# Patient Record
Sex: Female | Born: 1940 | Race: White | Hispanic: No | Marital: Married | Smoking: Former smoker
Health system: Southern US, Community
[De-identification: ages and names within clinical notes are randomized; demographics above are authoritative.]

## PROBLEM LIST (undated history)

## (undated) DIAGNOSIS — M722 Plantar fascial fibromatosis: Secondary | ICD-10-CM

## (undated) DIAGNOSIS — K589 Irritable bowel syndrome without diarrhea: Secondary | ICD-10-CM

## (undated) DIAGNOSIS — I10 Essential (primary) hypertension: Secondary | ICD-10-CM

## (undated) DIAGNOSIS — Z803 Family history of malignant neoplasm of breast: Secondary | ICD-10-CM

## (undated) DIAGNOSIS — Z8041 Family history of malignant neoplasm of ovary: Secondary | ICD-10-CM

## (undated) DIAGNOSIS — K219 Gastro-esophageal reflux disease without esophagitis: Secondary | ICD-10-CM

## (undated) DIAGNOSIS — C50919 Malignant neoplasm of unspecified site of unspecified female breast: Secondary | ICD-10-CM

## (undated) DIAGNOSIS — Z9289 Personal history of other medical treatment: Secondary | ICD-10-CM

## (undated) HISTORY — DX: Malignant neoplasm of unspecified site of unspecified female breast: C50.919

## (undated) HISTORY — DX: Family history of malignant neoplasm of ovary: Z80.41

## (undated) HISTORY — PX: BREAST LUMPECTOMY: SHX2

## (undated) HISTORY — DX: Family history of malignant neoplasm of breast: Z80.3

## (undated) HISTORY — PX: TONSILLECTOMY AND ADENOIDECTOMY: SUR1326

## (undated) HISTORY — DX: Plantar fascial fibromatosis: M72.2

## (undated) HISTORY — DX: Personal history of other medical treatment: Z92.89

## (undated) HISTORY — DX: Gastro-esophageal reflux disease without esophagitis: K21.9

## (undated) HISTORY — DX: Irritable bowel syndrome, unspecified: K58.9

## (undated) HISTORY — DX: Essential (primary) hypertension: I10

## (undated) HISTORY — DX: Gilbert syndrome: E80.4

---

## 1974-09-18 DIAGNOSIS — Z9289 Personal history of other medical treatment: Secondary | ICD-10-CM

## 1974-09-18 HISTORY — DX: Personal history of other medical treatment: Z92.89

## 1974-09-18 HISTORY — PX: APPENDECTOMY: SHX54

## 1974-09-18 HISTORY — PX: ABDOMINAL HYSTERECTOMY: SHX81

## 2000-07-06 ENCOUNTER — Other Ambulatory Visit: Admission: RE | Admit: 2000-07-06 | Discharge: 2000-07-06 | Payer: Self-pay | Admitting: Obstetrics and Gynecology

## 2001-07-15 ENCOUNTER — Other Ambulatory Visit: Admission: RE | Admit: 2001-07-15 | Discharge: 2001-07-15 | Payer: Self-pay | Admitting: Obstetrics and Gynecology

## 2004-09-18 HISTORY — PX: COLONOSCOPY W/ POLYPECTOMY: SHX1380

## 2005-02-20 ENCOUNTER — Ambulatory Visit: Payer: Self-pay | Admitting: Internal Medicine

## 2005-02-22 ENCOUNTER — Encounter: Admission: RE | Admit: 2005-02-22 | Discharge: 2005-02-22 | Payer: Self-pay | Admitting: Internal Medicine

## 2005-03-01 ENCOUNTER — Encounter: Admission: RE | Admit: 2005-03-01 | Discharge: 2005-05-30 | Payer: Self-pay | Admitting: Internal Medicine

## 2005-03-20 ENCOUNTER — Ambulatory Visit: Payer: Self-pay | Admitting: Gastroenterology

## 2005-03-23 ENCOUNTER — Ambulatory Visit: Payer: Self-pay | Admitting: Internal Medicine

## 2005-04-06 ENCOUNTER — Encounter (INDEPENDENT_AMBULATORY_CARE_PROVIDER_SITE_OTHER): Payer: Self-pay | Admitting: Specialist

## 2005-04-06 ENCOUNTER — Encounter: Payer: Self-pay | Admitting: Internal Medicine

## 2005-04-06 ENCOUNTER — Ambulatory Visit: Payer: Self-pay | Admitting: Gastroenterology

## 2005-05-24 ENCOUNTER — Ambulatory Visit: Payer: Self-pay | Admitting: Internal Medicine

## 2005-10-09 ENCOUNTER — Ambulatory Visit: Payer: Self-pay | Admitting: Internal Medicine

## 2006-01-08 ENCOUNTER — Ambulatory Visit: Payer: Self-pay | Admitting: Internal Medicine

## 2006-01-29 ENCOUNTER — Ambulatory Visit: Payer: Self-pay | Admitting: Internal Medicine

## 2006-07-03 ENCOUNTER — Ambulatory Visit: Payer: Self-pay | Admitting: Internal Medicine

## 2006-07-03 LAB — CONVERTED CEMR LAB
ALT: 39 units/L (ref 0–40)
AST: 30 units/L (ref 0–37)
Cholesterol: 175 mg/dL (ref 0–200)
Creatinine,U: 68.3 mg/dL
Hgb A1c MFr Bld: 5.9 % (ref 4.6–6.0)
Microalb, Ur: 0.2 mg/dL (ref 0.0–1.9)

## 2006-07-10 ENCOUNTER — Ambulatory Visit: Payer: Self-pay | Admitting: Internal Medicine

## 2006-10-02 ENCOUNTER — Ambulatory Visit: Payer: Self-pay | Admitting: Internal Medicine

## 2006-10-02 LAB — CONVERTED CEMR LAB
ALT: 32 units/L (ref 0–40)
Creatinine,U: 213.7 mg/dL
Hgb A1c MFr Bld: 6.2 % — ABNORMAL HIGH (ref 4.6–6.0)
Microalb Creat Ratio: 1.4 mg/g (ref 0.0–30.0)
Microalb, Ur: 0.3 mg/dL (ref 0.0–1.9)
VLDL: 34 mg/dL (ref 0–40)

## 2006-10-11 ENCOUNTER — Ambulatory Visit: Payer: Self-pay | Admitting: Internal Medicine

## 2007-02-06 ENCOUNTER — Ambulatory Visit: Payer: Self-pay | Admitting: Internal Medicine

## 2007-02-06 HISTORY — DX: Other disorders of bilirubin metabolism: E80.6

## 2007-02-06 LAB — CONVERTED CEMR LAB
BUN: 16 mg/dL (ref 6–23)
Creatinine, Ser: 1 mg/dL (ref 0.4–1.2)
HDL: 27.5 mg/dL — ABNORMAL LOW (ref >39.0)
Hgb A1c MFr Bld: 6 % (ref 4.6–6.0)
LDL Cholesterol: 61 mg/dL (ref 0–99)
Total CHOL/HDL Ratio: 4.5
Triglycerides: 172 mg/dL — ABNORMAL HIGH (ref 0–149)
VLDL: 34 mg/dL (ref 0–40)

## 2007-02-13 ENCOUNTER — Ambulatory Visit: Payer: Self-pay | Admitting: Internal Medicine

## 2007-02-13 ENCOUNTER — Encounter: Payer: Self-pay | Admitting: Internal Medicine

## 2007-04-17 ENCOUNTER — Encounter: Payer: Self-pay | Admitting: Internal Medicine

## 2007-07-24 ENCOUNTER — Ambulatory Visit: Payer: Self-pay | Admitting: Internal Medicine

## 2007-07-31 ENCOUNTER — Encounter (INDEPENDENT_AMBULATORY_CARE_PROVIDER_SITE_OTHER): Payer: Self-pay | Admitting: *Deleted

## 2007-07-31 LAB — CONVERTED CEMR LAB
Cholesterol: 142 mg/dL (ref 0–200)
Hgb A1c MFr Bld: 6 % (ref 4.6–6.0)
Triglycerides: 182 mg/dL — ABNORMAL HIGH (ref 0–149)

## 2007-09-30 ENCOUNTER — Ambulatory Visit: Payer: Self-pay | Admitting: Internal Medicine

## 2007-09-30 DIAGNOSIS — E781 Pure hyperglyceridemia: Secondary | ICD-10-CM | POA: Insufficient documentation

## 2007-09-30 DIAGNOSIS — R61 Generalized hyperhidrosis: Secondary | ICD-10-CM | POA: Insufficient documentation

## 2007-09-30 HISTORY — DX: Pure hyperglyceridemia: E78.1

## 2007-09-30 LAB — CONVERTED CEMR LAB
Cholesterol, target level: 200 mg/dL
HDL goal, serum: 40 mg/dL
LDL Goal: 130 mg/dL

## 2008-03-26 ENCOUNTER — Ambulatory Visit: Payer: Self-pay | Admitting: Internal Medicine

## 2008-03-26 LAB — CONVERTED CEMR LAB
Creatinine,U: 55.6 mg/dL
Hgb A1c MFr Bld: 6.3 % — ABNORMAL HIGH (ref 4.6–6.0)
Total CHOL/HDL Ratio: 5.5
Triglycerides: 199 mg/dL — ABNORMAL HIGH (ref 0–149)

## 2008-04-03 ENCOUNTER — Ambulatory Visit: Payer: Self-pay | Admitting: Internal Medicine

## 2008-05-06 ENCOUNTER — Ambulatory Visit: Payer: Self-pay | Admitting: Internal Medicine

## 2008-05-06 DIAGNOSIS — K219 Gastro-esophageal reflux disease without esophagitis: Secondary | ICD-10-CM

## 2008-05-06 DIAGNOSIS — T887XXA Unspecified adverse effect of drug or medicament, initial encounter: Secondary | ICD-10-CM

## 2008-05-06 HISTORY — DX: Gastro-esophageal reflux disease without esophagitis: K21.9

## 2008-07-29 ENCOUNTER — Ambulatory Visit: Payer: Self-pay | Admitting: Internal Medicine

## 2008-08-05 ENCOUNTER — Ambulatory Visit: Payer: Self-pay | Admitting: Internal Medicine

## 2008-08-05 LAB — CONVERTED CEMR LAB
Creatinine, Ser: 1.1 mg/dL (ref 0.4–1.2)
HDL: 27.3 mg/dL — ABNORMAL LOW (ref >39.0)
Hgb A1c MFr Bld: 6.2 % — ABNORMAL HIGH (ref 4.6–6.0)
Potassium: 3.7 meq/L (ref 3.5–5.1)

## 2008-08-11 ENCOUNTER — Ambulatory Visit: Payer: Self-pay | Admitting: Internal Medicine

## 2008-08-11 DIAGNOSIS — E1122 Type 2 diabetes mellitus with diabetic chronic kidney disease: Secondary | ICD-10-CM

## 2008-08-11 DIAGNOSIS — R0609 Other forms of dyspnea: Secondary | ICD-10-CM

## 2008-08-11 DIAGNOSIS — N183 Chronic kidney disease, stage 3 unspecified: Secondary | ICD-10-CM

## 2008-08-11 DIAGNOSIS — R0989 Other specified symptoms and signs involving the circulatory and respiratory systems: Secondary | ICD-10-CM

## 2008-08-11 DIAGNOSIS — E119 Type 2 diabetes mellitus without complications: Secondary | ICD-10-CM

## 2008-08-11 HISTORY — DX: Type 2 diabetes mellitus with diabetic chronic kidney disease: E11.22

## 2008-08-11 HISTORY — DX: Chronic kidney disease, stage 3 unspecified: N18.30

## 2008-08-14 ENCOUNTER — Encounter (INDEPENDENT_AMBULATORY_CARE_PROVIDER_SITE_OTHER): Payer: Self-pay | Admitting: *Deleted

## 2008-08-17 ENCOUNTER — Ambulatory Visit: Payer: Self-pay | Admitting: Internal Medicine

## 2008-08-17 LAB — CONVERTED CEMR LAB: TSH: 2 u[IU]/mL (ref 0.35–5.50)

## 2008-09-01 ENCOUNTER — Ambulatory Visit: Payer: Self-pay

## 2008-09-01 ENCOUNTER — Encounter: Payer: Self-pay | Admitting: Internal Medicine

## 2008-09-15 ENCOUNTER — Ambulatory Visit: Payer: Self-pay | Admitting: Internal Medicine

## 2008-09-16 ENCOUNTER — Ambulatory Visit: Payer: Self-pay | Admitting: Internal Medicine

## 2008-10-12 ENCOUNTER — Ambulatory Visit: Payer: Self-pay | Admitting: Internal Medicine

## 2008-10-12 ENCOUNTER — Ambulatory Visit (HOSPITAL_COMMUNITY): Admission: RE | Admit: 2008-10-12 | Discharge: 2008-10-12 | Payer: Self-pay | Admitting: Internal Medicine

## 2009-01-22 ENCOUNTER — Ambulatory Visit: Payer: Self-pay | Admitting: Internal Medicine

## 2009-01-25 ENCOUNTER — Telehealth (INDEPENDENT_AMBULATORY_CARE_PROVIDER_SITE_OTHER): Payer: Self-pay | Admitting: *Deleted

## 2009-02-08 ENCOUNTER — Ambulatory Visit: Payer: Self-pay | Admitting: Family Medicine

## 2009-02-10 ENCOUNTER — Encounter: Admission: RE | Admit: 2009-02-10 | Discharge: 2009-05-11 | Payer: Self-pay | Admitting: Sports Medicine

## 2009-02-13 ENCOUNTER — Encounter (INDEPENDENT_AMBULATORY_CARE_PROVIDER_SITE_OTHER): Payer: Self-pay | Admitting: *Deleted

## 2009-02-13 LAB — CONVERTED CEMR LAB
Alkaline Phosphatase: 64 units/L (ref 39–117)
BUN: 16 mg/dL (ref 6–23)
Bilirubin, Direct: 0 mg/dL (ref 0.0–0.3)
Creatinine, Ser: 1.1 mg/dL (ref 0.4–1.2)
HDL: 26 mg/dL — ABNORMAL LOW (ref >39.00)
Hgb A1c MFr Bld: 6.4 % (ref 4.6–6.5)
Microalb Creat Ratio: 1.4 mg/g (ref 0.0–30.0)
Total Bilirubin: 0.8 mg/dL (ref 0.3–1.2)
Total CHOL/HDL Ratio: 5
Triglycerides: 241 mg/dL — ABNORMAL HIGH (ref 0.0–149.0)

## 2009-02-16 ENCOUNTER — Ambulatory Visit: Payer: Self-pay | Admitting: Internal Medicine

## 2009-02-16 DIAGNOSIS — I10 Essential (primary) hypertension: Secondary | ICD-10-CM

## 2009-02-16 HISTORY — DX: Essential (primary) hypertension: I10

## 2009-06-07 ENCOUNTER — Ambulatory Visit: Payer: Self-pay | Admitting: Internal Medicine

## 2009-06-07 LAB — CONVERTED CEMR LAB
AST: 37 units/L (ref 0–37)
Albumin: 3.6 g/dL (ref 3.5–5.2)
Alkaline Phosphatase: 55 units/L (ref 39–117)
Hgb A1c MFr Bld: 6.4 % (ref 4.6–6.5)
VLDL: 39 mg/dL (ref 0.0–40.0)

## 2009-06-10 ENCOUNTER — Ambulatory Visit: Payer: Self-pay | Admitting: Internal Medicine

## 2009-08-05 ENCOUNTER — Ambulatory Visit: Payer: Self-pay | Admitting: Internal Medicine

## 2009-08-06 ENCOUNTER — Ambulatory Visit: Payer: Self-pay | Admitting: Internal Medicine

## 2010-01-10 ENCOUNTER — Telehealth (INDEPENDENT_AMBULATORY_CARE_PROVIDER_SITE_OTHER): Payer: Self-pay | Admitting: *Deleted

## 2010-01-31 ENCOUNTER — Ambulatory Visit: Payer: Self-pay | Admitting: Internal Medicine

## 2010-01-31 DIAGNOSIS — IMO0001 Reserved for inherently not codable concepts without codable children: Secondary | ICD-10-CM | POA: Insufficient documentation

## 2010-01-31 DIAGNOSIS — R5381 Other malaise: Secondary | ICD-10-CM | POA: Insufficient documentation

## 2010-01-31 DIAGNOSIS — R5383 Other fatigue: Secondary | ICD-10-CM

## 2010-02-01 LAB — CONVERTED CEMR LAB
AST: 43 units/L — ABNORMAL HIGH (ref 0–37)
Basophils Absolute: 0 10*3/uL (ref 0.0–0.1)
CO2: 28 meq/L (ref 19–32)
Calcium: 9.7 mg/dL (ref 8.4–10.5)
Chloride: 104 meq/L (ref 96–112)
Eosinophils Absolute: 0.1 10*3/uL (ref 0.0–0.7)
Glucose, Bld: 134 mg/dL — ABNORMAL HIGH (ref 70–99)
HCT: 40.7 % (ref 36.0–46.0)
Hemoglobin: 14 g/dL (ref 12.0–15.0)
Lymphs Abs: 1.9 10*3/uL (ref 0.7–4.0)
MCHC: 34.5 g/dL (ref 30.0–36.0)
MCV: 89.5 fL (ref 78.0–100.0)
Monocytes Absolute: 0.6 10*3/uL (ref 0.1–1.0)
Neutro Abs: 2.9 10*3/uL (ref 1.4–7.7)
Potassium: 4.4 meq/L (ref 3.5–5.1)
RDW: 14.3 % (ref 11.5–14.6)
Sodium: 142 meq/L (ref 135–145)
Total Bilirubin: 0.9 mg/dL (ref 0.3–1.2)

## 2010-02-24 ENCOUNTER — Ambulatory Visit: Payer: Self-pay | Admitting: Internal Medicine

## 2010-03-04 ENCOUNTER — Encounter (INDEPENDENT_AMBULATORY_CARE_PROVIDER_SITE_OTHER): Payer: Self-pay | Admitting: *Deleted

## 2010-03-04 LAB — CONVERTED CEMR LAB
AST: 45 units/L — ABNORMAL HIGH (ref 0–37)
Hgb A1c MFr Bld: 6.6 % — ABNORMAL HIGH (ref 4.6–6.5)

## 2010-05-25 ENCOUNTER — Ambulatory Visit: Payer: Self-pay | Admitting: Internal Medicine

## 2010-05-25 DIAGNOSIS — K3189 Other diseases of stomach and duodenum: Secondary | ICD-10-CM

## 2010-05-25 DIAGNOSIS — R1319 Other dysphagia: Secondary | ICD-10-CM

## 2010-05-25 DIAGNOSIS — R1013 Epigastric pain: Secondary | ICD-10-CM

## 2010-05-25 DIAGNOSIS — R079 Chest pain, unspecified: Secondary | ICD-10-CM | POA: Insufficient documentation

## 2010-05-30 ENCOUNTER — Ambulatory Visit: Payer: Self-pay | Admitting: Internal Medicine

## 2010-05-30 LAB — CONVERTED CEMR LAB
OCCULT 1: NEGATIVE
OCCULT 2: NEGATIVE

## 2010-05-31 ENCOUNTER — Encounter (INDEPENDENT_AMBULATORY_CARE_PROVIDER_SITE_OTHER): Payer: Self-pay | Admitting: *Deleted

## 2010-06-02 ENCOUNTER — Telehealth: Payer: Self-pay | Admitting: Internal Medicine

## 2010-06-06 ENCOUNTER — Encounter (INDEPENDENT_AMBULATORY_CARE_PROVIDER_SITE_OTHER): Payer: Self-pay | Admitting: *Deleted

## 2010-06-06 ENCOUNTER — Ambulatory Visit: Payer: Self-pay | Admitting: Internal Medicine

## 2010-06-06 DIAGNOSIS — Z860100 Personal history of colon polyps, unspecified: Secondary | ICD-10-CM

## 2010-06-06 DIAGNOSIS — R197 Diarrhea, unspecified: Secondary | ICD-10-CM

## 2010-06-06 HISTORY — DX: Personal history of colon polyps, unspecified: Z86.0100

## 2010-06-06 HISTORY — DX: Personal history of colonic polyps: Z86.010

## 2010-06-23 ENCOUNTER — Ambulatory Visit: Payer: Self-pay | Admitting: Internal Medicine

## 2010-06-27 ENCOUNTER — Encounter: Payer: Self-pay | Admitting: Internal Medicine

## 2010-07-01 ENCOUNTER — Encounter: Payer: Self-pay | Admitting: Internal Medicine

## 2010-07-12 ENCOUNTER — Encounter: Payer: Self-pay | Admitting: Internal Medicine

## 2010-07-15 ENCOUNTER — Encounter: Payer: Self-pay | Admitting: Internal Medicine

## 2010-07-18 ENCOUNTER — Encounter: Payer: Self-pay | Admitting: Internal Medicine

## 2010-07-19 ENCOUNTER — Encounter: Payer: Self-pay | Admitting: Internal Medicine

## 2010-07-19 ENCOUNTER — Telehealth (INDEPENDENT_AMBULATORY_CARE_PROVIDER_SITE_OTHER): Payer: Self-pay | Admitting: *Deleted

## 2010-07-25 ENCOUNTER — Encounter: Admission: RE | Admit: 2010-07-25 | Discharge: 2010-07-25 | Payer: Self-pay | Admitting: Radiology

## 2010-07-27 ENCOUNTER — Encounter: Payer: Self-pay | Admitting: Internal Medicine

## 2010-08-02 ENCOUNTER — Encounter: Payer: Self-pay | Admitting: Internal Medicine

## 2010-08-19 ENCOUNTER — Ambulatory Visit: Payer: Self-pay | Admitting: Internal Medicine

## 2010-08-22 ENCOUNTER — Ambulatory Visit
Admission: RE | Admit: 2010-08-22 | Discharge: 2010-09-15 | Payer: Self-pay | Source: Home / Self Care | Attending: Radiation Oncology | Admitting: Radiation Oncology

## 2010-08-23 ENCOUNTER — Encounter: Payer: Self-pay | Admitting: Internal Medicine

## 2010-09-02 ENCOUNTER — Ambulatory Visit: Payer: Self-pay | Admitting: Internal Medicine

## 2010-09-02 ENCOUNTER — Encounter: Payer: Self-pay | Admitting: Internal Medicine

## 2010-09-02 DIAGNOSIS — M2550 Pain in unspecified joint: Secondary | ICD-10-CM | POA: Insufficient documentation

## 2010-09-02 HISTORY — DX: Pain in unspecified joint: M25.50

## 2010-09-04 DIAGNOSIS — C50111 Malignant neoplasm of central portion of right female breast: Secondary | ICD-10-CM

## 2010-09-04 HISTORY — DX: Malignant neoplasm of central portion of right female breast: C50.111

## 2010-09-05 ENCOUNTER — Ambulatory Visit: Payer: Self-pay | Admitting: Internal Medicine

## 2010-09-05 ENCOUNTER — Telehealth (INDEPENDENT_AMBULATORY_CARE_PROVIDER_SITE_OTHER): Payer: Self-pay | Admitting: *Deleted

## 2010-09-05 ENCOUNTER — Encounter: Payer: Self-pay | Admitting: Internal Medicine

## 2010-09-05 LAB — CONVERTED CEMR LAB
Basophils Relative: 0.5 % (ref 0.0–3.0)
Eosinophils Relative: 3.7 % (ref 0.0–5.0)
HCT: 38.2 % (ref 36.0–46.0)
Hemoglobin: 13.3 g/dL (ref 12.0–15.0)
Lymphs Abs: 1.8 10*3/uL (ref 0.7–4.0)
MCV: 89.4 fL (ref 78.0–100.0)
Monocytes Absolute: 0.6 10*3/uL (ref 0.1–1.0)
Monocytes Relative: 10 % (ref 3.0–12.0)
Neutro Abs: 3.4 10*3/uL (ref 1.4–7.7)
Platelets: 254 10*3/uL (ref 150.0–400.0)
RBC: 4.27 M/uL (ref 3.87–5.11)
Rhuematoid fact SerPl-aCnc: <20 intl units/mL (ref 0–20)
WBC: 6.1 10*3/uL (ref 4.5–10.5)

## 2010-09-06 ENCOUNTER — Encounter: Payer: Self-pay | Admitting: Internal Medicine

## 2010-09-07 ENCOUNTER — Telehealth (INDEPENDENT_AMBULATORY_CARE_PROVIDER_SITE_OTHER): Payer: Self-pay | Admitting: *Deleted

## 2010-09-07 LAB — CONVERTED CEMR LAB
ALT: 68 units/L — ABNORMAL HIGH (ref 0–35)
AST: 75 units/L — ABNORMAL HIGH (ref 0–37)
Alkaline Phosphatase: 67 units/L (ref 39–117)
Bilirubin, Direct: 0.2 mg/dL (ref 0.0–0.3)
HCV Ab: NEGATIVE
Total Bilirubin: 1.4 mg/dL — ABNORMAL HIGH (ref 0.3–1.2)

## 2010-09-14 ENCOUNTER — Ambulatory Visit: Payer: Self-pay | Admitting: Internal Medicine

## 2010-09-14 DIAGNOSIS — R7989 Other specified abnormal findings of blood chemistry: Secondary | ICD-10-CM | POA: Insufficient documentation

## 2010-09-15 ENCOUNTER — Encounter: Payer: Self-pay | Admitting: Internal Medicine

## 2010-09-15 LAB — CONVERTED CEMR LAB
ALT: 59 units/L — ABNORMAL HIGH (ref 0–35)
AST: 53 units/L — ABNORMAL HIGH (ref 0–37)
Bilirubin, Direct: 0.1 mg/dL (ref 0.0–0.3)
Total Bilirubin: 0.9 mg/dL (ref 0.3–1.2)

## 2010-09-16 ENCOUNTER — Telehealth (INDEPENDENT_AMBULATORY_CARE_PROVIDER_SITE_OTHER): Payer: Self-pay | Admitting: *Deleted

## 2010-09-16 ENCOUNTER — Encounter
Admission: RE | Admit: 2010-09-16 | Discharge: 2010-09-16 | Payer: Self-pay | Source: Home / Self Care | Attending: Internal Medicine | Admitting: Internal Medicine

## 2010-09-16 ENCOUNTER — Encounter: Payer: Self-pay | Admitting: Internal Medicine

## 2010-09-18 DIAGNOSIS — C50919 Malignant neoplasm of unspecified site of unspecified female breast: Secondary | ICD-10-CM

## 2010-09-18 HISTORY — DX: Malignant neoplasm of unspecified site of unspecified female breast: C50.919

## 2010-09-18 HISTORY — PX: MASTECTOMY W/ NODES PARTIAL: SUR854

## 2010-09-23 ENCOUNTER — Ambulatory Visit
Admission: RE | Admit: 2010-09-23 | Discharge: 2010-09-23 | Payer: Self-pay | Source: Home / Self Care | Attending: Internal Medicine | Admitting: Internal Medicine

## 2010-09-23 ENCOUNTER — Other Ambulatory Visit: Payer: Self-pay | Admitting: Internal Medicine

## 2010-09-23 LAB — ALT: ALT: 65 U/L — ABNORMAL HIGH (ref 0–35)

## 2010-09-23 LAB — AST: AST: 58 U/L — ABNORMAL HIGH (ref 0–37)

## 2010-10-12 ENCOUNTER — Telehealth (INDEPENDENT_AMBULATORY_CARE_PROVIDER_SITE_OTHER): Payer: Self-pay | Admitting: *Deleted

## 2010-10-12 ENCOUNTER — Inpatient Hospital Stay (HOSPITAL_COMMUNITY)
Admission: RE | Admit: 2010-10-12 | Discharge: 2010-10-14 | Payer: Self-pay | Source: Home / Self Care | Attending: Surgery | Admitting: Surgery

## 2010-10-12 ENCOUNTER — Ambulatory Visit (HOSPITAL_COMMUNITY)
Admission: RE | Admit: 2010-10-12 | Discharge: 2010-10-12 | Payer: Self-pay | Source: Home / Self Care | Attending: Surgery | Admitting: Surgery

## 2010-10-12 LAB — CBC
HCT: 29 % — ABNORMAL LOW (ref 36.0–46.0)
Hemoglobin: 13.5 g/dL (ref 12.0–15.0)
Hemoglobin: 9.7 g/dL — ABNORMAL LOW (ref 12.0–15.0)
MCH: 29.3 pg (ref 26.0–34.0)
MCH: 29.1 pg (ref 26.0–34.0)
MCHC: 34.2 g/dL (ref 30.0–36.0)
MCHC: 33.4 g/dL (ref 30.0–36.0)
MCV: 85.7 fL (ref 78.0–100.0)
MCV: 87.1 fL (ref 78.0–100.0)
RDW: 13.3 % (ref 11.5–15.5)

## 2010-10-12 LAB — COMPREHENSIVE METABOLIC PANEL
ALT: 69 U/L — ABNORMAL HIGH (ref 0–35)
AST: 64 U/L — ABNORMAL HIGH (ref 0–37)
Alkaline Phosphatase: 69 U/L (ref 39–117)
CO2: 26 mEq/L (ref 19–32)
Calcium: 9.8 mg/dL (ref 8.4–10.5)
Chloride: 106 mEq/L (ref 96–112)
GFR calc non Af Amer: 50 mL/min — ABNORMAL LOW (ref >60)
Glucose, Bld: 153 mg/dL — ABNORMAL HIGH (ref 70–99)
Sodium: 141 mEq/L (ref 135–145)
Total Bilirubin: 1 mg/dL (ref 0.3–1.2)

## 2010-10-12 LAB — URINALYSIS, ROUTINE W REFLEX MICROSCOPIC
Bilirubin Urine: NEGATIVE
Hgb urine dipstick: NEGATIVE
Protein, ur: NEGATIVE mg/dL
Urobilinogen, UA: 0.2 mg/dL (ref 0.0–1.0)

## 2010-10-12 LAB — PROTIME-INR: Prothrombin Time: 12.7 seconds (ref 11.6–15.2)

## 2010-10-12 LAB — DIFFERENTIAL
Basophils Relative: 1 % (ref 0–1)
Eosinophils Absolute: 0.1 10*3/uL (ref 0.0–0.7)
Lymphs Abs: 1.6 10*3/uL (ref 0.7–4.0)
Monocytes Absolute: 0.7 10*3/uL (ref 0.1–1.0)
Monocytes Relative: 12 % (ref 3–12)
Neutro Abs: 3.7 10*3/uL (ref 1.7–7.7)

## 2010-10-12 LAB — BASIC METABOLIC PANEL
BUN: 11 mg/dL (ref 6–23)
CO2: 24 mEq/L (ref 19–32)
Calcium: 8 mg/dL — ABNORMAL LOW (ref 8.4–10.5)
GFR calc non Af Amer: 46 mL/min — ABNORMAL LOW (ref >60)
Glucose, Bld: 243 mg/dL — ABNORMAL HIGH (ref 70–99)

## 2010-10-12 LAB — SURGICAL PCR SCREEN: Staphylococcus aureus: NEGATIVE

## 2010-10-13 LAB — BASIC METABOLIC PANEL
CO2: 28 mEq/L (ref 19–32)
Calcium: 8.5 mg/dL (ref 8.4–10.5)
Creatinine, Ser: 1.11 mg/dL (ref 0.4–1.2)
GFR calc Af Amer: 59 mL/min — ABNORMAL LOW (ref >60)
GFR calc non Af Amer: 49 mL/min — ABNORMAL LOW (ref >60)
Glucose, Bld: 138 mg/dL — ABNORMAL HIGH (ref 70–99)
Sodium: 142 mEq/L (ref 135–145)

## 2010-10-13 LAB — CBC
Hemoglobin: 9.9 g/dL — ABNORMAL LOW (ref 12.0–15.0)
MCH: 29.7 pg (ref 26.0–34.0)
MCHC: 33.3 g/dL (ref 30.0–36.0)
RDW: 13.6 % (ref 11.5–15.5)

## 2010-10-13 NOTE — Op Note (Addendum)
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  PROCEDURES: 1. Right total mastectomy with blue dye injection and right axillary     sentinel lymph node biopsy (one node). 2. Left total mastectomy.  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  She also wished to have a prophylactic left mastectomy and she had seen Dr. Wayland Denis for consultation for possible immediate reconstructions.  After the consultation, the plan was for a total mastectomy bilaterally with sentinel lymph node evaluation on the right and bilateral tissue expander reconstruction.  DESCRIPTION OF PROCEDURE:  I saw the patient in the holding area and she had no further questions.  We confirmed bilateral mastectomies with right sentinel node as the planned procedure and I initialed the right side as the side for the sentinel node.  When I saw her, her radioisotope had already been injected in the right breast.  The patient was taken to the operating room and after satisfactory general anesthesia had been obtained, Foley catheter was placed and the breast was prepped and draped.  I did a time-out prior to prepping and after that was done injected 5 mL of dilute methylene blue subareolarly and massaged that area.  I started on the right side.  I made an elliptical incision  and then raised the usual skin flaps to sternum and clavicle and out towards the latissimus.  When I got to the edge of the pectoralis with skin flap, I used the NeoProbe and found a hot area and dissection in that area showed a blue lymphatic leading to a bright blue lymph node, which was removed and had counts of about 1500.  With that removed, there was no palpable adenopathy, no other high counts, and no other blue lymph nodes noted.  Attention was turned to the inferior flap, which was then made in the usual fashion going at the inframammary fold and then laterally to latissimus.  The breast was removed from medial to lateral.  I opened the clavipectoral fascia, but tried to stay out of the axilla proper, disconnected the breast from the chest wall.  About this time, Dr. Dierdre Searles had called with the pathology report on the lymph node, which was negative.  Once I disconnected the breast, we irrigated and made sure everything was completely dry and put a moist pack.  The left side was prophylactic, so I did a skin-sparing mastectomy taking only the skin of the nipple, raised skin flaps identical to the right side and removed the breast from medial  to lateral at this time trying to preserve some of the fascia since this was prophylactic.  Again once it was removed, we irrigated and made sure everything was dry, and I put moist pack.  At this point, Dr. Kelly Splinter entered to do the reconstructions.  The patient tolerated the procedure well.  There were no complications.  The estimated blood loss for that portion of the procedure was 200 mL.     Currie Paris, M.D.     CJS/MEDQ  D:  10/12/2010  T:  10/13/2010  Job:  [id number]  cc:   Wayland Denis, DO Titus Dubin. Alwyn Ren, MD,FACP,FCCP  Electronically Signed by Cyndia Bent M.D. on 10/13/2010 07:32:33 AM

## 2010-10-14 LAB — PROTEIN ELECTROPHORESIS, SERUM
Albumin ELP: 61.8 % (ref 55.8–66.1)
Beta 2: 5.4 % (ref 3.2–6.5)
Beta Globulin: 4.5 % — ABNORMAL LOW (ref 4.7–7.2)
M-Spike, %: NOT DETECTED g/dL
Total Protein ELP: 5.2 g/dL — ABNORMAL LOW (ref 6.0–8.3)

## 2010-10-16 LAB — CONVERTED CEMR LAB
ALT: 64 units/L — ABNORMAL HIGH (ref 0–35)
AST: 69 units/L — ABNORMAL HIGH (ref 0–37)
Albumin: 4 g/dL (ref 3.5–5.2)
Alkaline Phosphatase: 70 units/L (ref 39–117)
Amylase: 37 units/L (ref 27–131)
Eosinophils Relative: 1.8 % (ref 0.0–5.0)
Eosinophils Relative: 4.3 % (ref 0.0–5.0)
HCT: 37.6 % (ref 36.0–46.0)
Hemoglobin: 12.9 g/dL (ref 12.0–15.0)
Lymphocytes Relative: 42.7 % (ref 12.0–46.0)
Lymphs Abs: 2.5 10*3/uL (ref 0.7–4.0)
Monocytes Relative: 13.9 % — ABNORMAL HIGH (ref 3.0–12.0)
Monocytes Relative: 14.5 % — ABNORMAL HIGH (ref 3.0–12.0)
Neutro Abs: 2.2 10*3/uL (ref 1.4–7.7)
Neutrophils Relative %: 52.7 % (ref 43.0–77.0)
Platelets: 246 10*3/uL (ref 150–400)
Total Bilirubin: 0.9 mg/dL (ref 0.3–1.2)
WBC: 5.8 10*3/uL (ref 4.5–10.5)
WBC: 6.5 10*3/uL (ref 4.5–10.5)

## 2010-10-18 ENCOUNTER — Encounter: Payer: Self-pay | Admitting: Internal Medicine

## 2010-10-18 NOTE — Assessment & Plan Note (Signed)
Summary: indigestion/cbs   Vital Signs:  Patient profile:   70 year old female Weight:      190.2 pounds BMI:     32.77 Temp:     99.1 degrees F oral Pulse rate:   64 / minute Resp:     17 per minute BP sitting:   124 / 88  (left arm) Cuff size:   large  Vitals Entered By: Shonna Chock CMA (May 25, 2010 1:50 PM) CC: Indigestion since Aug 2011, worse x 1 week, Heartburn   Primary Care Provider:  [name] MD  CC:  Indigestion since Aug 2011, worse x 1 week, and Heartburn.  History of Present Illness: Heartburn      This is a 70 year old woman who presents with Heartburn X 1 month.  The patient reports acid reflux, sour taste in mouth, epigastric pain, and chest pain, but denies trouble swallowing and weight loss.  The patient reports the following alarm features of dyspepsia: dysphagia "a couple of times / day".  The patient denies the following alarm features: melena, hematemesis, vomiting, and involuntary weight loss >5%.  Symptoms are worse with citrus. Similar issue in 1996: esophageal stricture was found  @ EGD .  Treatment tried and  ineffective or stopped due to problems include elevating the head of bed, an antacid, and an H2 blocker (Pepcid AC).    Current Medications (verified): 1)  Diovan Hct 320-12.5 Mg  Tabs (Valsartan-Hydrochlorothiazide) .... 1/2 By Mouth Qam 1/2 Pm 2)  Metformin Hcl 500 Mg  Tabs (Metformin Hcl) .Marland Kitchen.. 1 By Mouth Two Times A Day With Largest Meals 3)  Pravastatin Sodium 20 Mg  Tabs (Pravastatin Sodium) .... 1/2 At Bedtime 4)  Multivitamin .... Qd 5)  Calcium With Vit D 600mg  .... Bid 6)  Fish Oil Concentrate .... 1000mg  Bid 7)  Clonidine Hcl 0.2 Mg  Tabs (Clonidine Hcl) .Marland Kitchen.. 1 Bid 8)  Oxytrol 3.9 Mg/24hr Pttw (Oxybutynin) .Marland Kitchen.. 1 On Mondays-Pm, 1 On Friday-Am 9)  Vitamin D3 1000 Unit Caps (Cholecalciferol) .Marland Kitchen.. 1 By Mouth Two Times A Day 10)  Sulfamethoxazole-Trimethoprim 400-80 Mg Tabs (Sulfamethoxazole-Trimethoprim) .... As A  Prevenative  Allergies: 1)  ! Ace Inhibitors 2)  ! Codeine  Past History:  Past Medical History: Hypertension Hyperlipidemia Gilbert's syndrome ACE-I cough GERD with stricture  Past Surgical History: Hysterectomy Oophorectomycolonoscopy with polyps due 2011 breast biopsy times 6 with lumpectomy Colon polypectomy 2006 ,due 2011, Dr Leone Payor EGD: stricture 1996  Review of Systems CV:  Denies leg cramps with exertion and shortness of breath with exertion; No exertional chest pain.  Physical Exam  General:  well-nourished,in no acute distress; alert,appropriate and cooperative throughout examination Eyes:  No corneal or conjunctival inflammation noted.No icterus Mouth:  Oral mucosa and oropharynx without lesions or exudates.  Teeth in good repair. No pharyngeal erythema.   Lungs:  Normal respiratory effort, chest expands symmetrically. Lungs are clear to auscultation, no crackles or wheezes. Heart:  Normal rate and regular rhythm. S1 and S2 normal without gallop, murmur, click, rub or other extra sounds. Abdomen:  Bowel sounds positive,abdomen soft  slightly tender  epigastrium without masses, organomegaly or hernias noted. Pulses:  R and L carotid,radial,dorsalis pedis and posterior tibial pulses are full and equal bilaterally Extremities:  No clubbing, cyanosis, edema. Neg Homan's Neurologic:  alert & oriented X3.   Skin:  Intact without suspicious lesions or rashes. No jaundice Cervical Nodes:  No lymphadenopathy noted Axillary Nodes:  No palpable lymphadenopathy Psych:  memory intact  for recent and remote, normally interactive, good eye contact, and not anxious appearing.     Impression & Recommendations:  Problem # 1:  DYSPEPSIA (ICD-536.8)  Severe  Orders: Venipuncture ([address]) Specimen Handling (60454) Gastroenterology Referral (GI)  Problem # 2:  ABDOMINAL PAIN, EPIGASTRIC (ICD-789.06)  Orders: Venipuncture (09811) TLB-CBC Platelet - w/Differential  (85025-CBCD) TLB-Hepatic/Liver Function Pnl (80076-HEPATIC) TLB-Amylase (82150-AMYL) TLB-H. Pylori Abs(Helicobacter Pylori) (86677-HELICO) TLB-Lipase (83690-LIPASE) Specimen Handling (91478) Gastroenterology Referral (GI)  Problem # 3:  CHEST PAIN (ICD-786.50)  Orders: EKG w/ Interpretation (93000)  Problem # 4:  OTHER DYSPHAGIA (GNF-621.30)  Orders: Venipuncture (86578) Gastroenterology Referral (GI)  Complete Medication List: 1)  Diovan Hct 320-12.5 Mg Tabs (Valsartan-hydrochlorothiazide) .... 1/2 by mouth qam 1/2 pm 2)  Metformin Hcl 500 Mg Tabs (Metformin hcl) .Marland Kitchen.. 1 by mouth two times a day with largest meals 3)  Pravastatin Sodium 20 Mg Tabs (Pravastatin sodium) .... 1/2 at bedtime 4)  Multivitamin  .... Qd 5)  Calcium With Vit D 600mg   .... Bid 6)  Fish Oil Concentrate  .... 1000mg  bid 7)  Clonidine Hcl 0.2 Mg Tabs (Clonidine hcl) .Marland Kitchen.. 1 bid 8)  Oxytrol 3.9 Mg/24hr Pttw (Oxybutynin) .Marland Kitchen.. 1 on mondays-pm, 1 on friday-am 9)  Vitamin D3 1000 Unit Caps (Cholecalciferol) .Marland Kitchen.. 1 by mouth two times a day 10)  Sulfamethoxazole-trimethoprim 400-80 Mg Tabs (Sulfamethoxazole-trimethoprim) .... As a prevenative 11)  Nexium 40 Mg Cpdr (Esomeprazole magnesium) .Marland Kitchen.. 1 two times a day pre meals  Patient Instructions: 1)  Complete stool cards.  2)  Avoid foods high in acid (tomatoes, citrus juices, spicy foods). Avoid eating within two hours of lying down or before exercising. Do not over eat; try smaller more frequent meals. Elevate head of bed twelve inches when sleeping. Prescriptions: NEXIUM 40 MG CPDR (ESOMEPRAZOLE MAGNESIUM) 1 two times a day pre meals  #20 x 0   Entered and Authorized by:   [name] MD   Signed by:   [name] MD on 05/26/2010   Method used:   Samples Given   RxID:   [id number]

## 2010-10-18 NOTE — Letter (Signed)
Summary:  [facility]   Imported By: Lanelle Bal 08/02/2010 11:11:22  _____________________________________________________________________  External Attachment:    Type:   Image     Comment:   External Document

## 2010-10-18 NOTE — Letter (Signed)
Summary: Colonoscopy Letter  [facility]  [address] [place], [place] [address]   Phone: [phone]  Fax: [phone]      March 04, 2010 MRN: [id number]   [facility] [address] [place], [place]  [address]   Dear [name],   According to your medical record, it is time for you to schedule a Colonoscopy. The American Cancer Society recommends this procedure as a method to detect early colon cancer. Patients with a family history of colon cancer, or a personal history of colon polyps or inflammatory bowel disease are at increased risk.  This letter has beeen generated based on the recommendations made at the time of your procedure. If you feel that in your particular situation this may no longer apply, please contact our office.  Please call our office at [phone] to schedule this appointment or to update your records at your earliest convenience.  Thank you for cooperating with [place] to provide you with the very best care possible.   Sincerely,    Iva Boop, M.D.  [facility] [phone]

## 2010-10-18 NOTE — Procedures (Signed)
Summary: Colonoscopy  Patient: [name] Note: All result statuses are Final unless otherwise noted.  Tests: (1) Colonoscopy (COL)   COL Colonoscopy           DONE     [facility]     [address]     [place], [place]  [address]           COLONOSCOPY PROCEDURE REPORT           PATIENT:  Carol, Meyers  MR#:  [id number]     BIRTHDATE:  Aug 01, 1941, 69 yrs. old  GENDER:  female     ENDOSCOPIST:  Iva Boop, MD, [facility]           PROCEDURE DATE:  06/23/2010     PROCEDURE:  Colonoscopy 19147     ASA CLASS:  Class II     INDICATIONS:  surveillance and high-risk screening, history of     polyps several diminutive polyps destroyed in 2006, only pathology     from 1 polyp is benign colon mucosa     MEDICATIONS:   Fentanyl 50 mcg IV, Versed 3 mg, There was residual     sedation effect present from prior procedure.           DESCRIPTION OF PROCEDURE:   After the risks benefits and     alternatives of the procedure were thoroughly explained, informed     consent was obtained.  Digital rectal exam was performed and     revealed no abnormalities.   The LB [id number]-[id number] endoscope     was introduced through the anus and advanced to the cecum, which     was identified by both the appendix and ileocecal valve, without     limitations.  The quality of the prep was excellent, using     MoviPrep.  The instrument was then slowly withdrawn as the colon     was fully examined. Insertion: 4:06 minutes Withdrawal: 6:30     minutes     <<PROCEDUREIMAGES>>           FINDINGS:  A normal appearing cecum, ileocecal valve, and     appendiceal orifice were identified. The ascending, hepatic     flexure, transverse, splenic flexure, descending, sigmoid colon,     and rectum appeared unremarkable. Strong IBS response to the scope     in the sigmoid.   Retroflexed views in the rectum revealed no     abnormalities.    The scope was then withdrawn from the patient     and the  procedure completed.           COMPLICATIONS:  None     ENDOSCOPIC IMPRESSION:           1) Normal colonoscopy     2) Irritable Bowel Syndrome     RECOMMENDATIONS:     Continue Align for Irritable Bowel Syndrome (post-prandial     urgent defecation), it is helping.     Follow-up with Dr. Leone Payor as needed.           REPEAT EXAM:  In 10 year(s) for routine screening colonoscopy.           Iva Boop, MD, [facility]           CC:  Pecola Lawless, MD and The Patient           n.     eSIGNED:   Iva Boop at 06/23/2010 04:28 PM  [name], [name], [id number]  Note: An exclamation mark (!) indicates a result that was not dispersed into the flowsheet. Document Creation Date: 06/23/2010 4:29 PM _______________________________________________________________________  (1) Order result status: Final Collection or observation date-time: 06/23/2010 16:15 Requested date-time:  Receipt date-time:  Reported date-time:  Referring Physician:   Ordering Physician: Stan Head [phone]) Specimen Source:  Source: Launa Grill Order Number: [id number] Lab site:   Appended Document: Colonoscopy    Clinical Lists Changes  Observations: Added new observation of COLONNXTDUE: 06/2020 (06/23/2010 16:40)

## 2010-10-18 NOTE — Assessment & Plan Note (Signed)
Summary: DYSPEPSIA,EPIGASTRIC ABD PAIN...EM    History of Present Illness Visit Type: Initial Consult Primary GI MD: Stan Head MD [facility] Primary Provider: [name] MD Requesting Provider: [name], MD Chief Complaint: Dyspepsia & epigastric pain History of Present Illness:   70 yo ww due for surveillance colonoscopy. She is having heartburn and some intermttent solid food dysphagia. She also has epigastric pain. 1995 had an EGD and dilation (SML)  There is a post-prandial diarrhea, with urgent defecation and one episode of incontinence. There is a "strange" left side pain, spasm, weird feeling. Stools tend to be soft. No nocturnal stools. 2-4 stools a day. Cannot always distinguish gas from stool. Lst antibiotics in Februrary.     GI Review of Systems    Reports abdominal pain, acid reflux, belching, bloating, loss of appetite, nausea, and  weight gain.     Location of  Abdominal pain: epigastric area.    Denies chest pain, dysphagia with liquids, dysphagia with solids, heartburn, vomiting, vomiting blood, and  weight loss.      Reports diarrhea.     Denies anal fissure, black tarry stools, change in bowel habit, constipation, diverticulosis, fecal incontinence, heme positive stool, hemorrhoids, irritable bowel syndrome, jaundice, light color stool, liver problems, rectal bleeding, and  rectal pain. Preventive Screening-Counseling & Management      Drug Use:  no.      Current Medications (verified): 1)  Diovan Hct 320-12.5 Mg  Tabs (Valsartan-Hydrochlorothiazide) .... 1/2 By Mouth Qam 1/2 Pm 2)  Metformin Hcl 500 Mg  Tabs (Metformin Hcl) .Marland Kitchen.. 1 By Mouth Two Times A Day With Largest Meals 3)  Multivitamin .... Qd 4)  Calcium With Vit D 600mg  .... Bid 5)  Fish Oil Concentrate .... 1000mg  Bid 6)  Clonidine Hcl 0.2 Mg  Tabs (Clonidine Hcl) .Marland Kitchen.. 1 Bid 7)  Oxytrol 3.9 Mg/24hr Pttw (Oxybutynin) .Marland Kitchen.. 1 On Mondays-Pm, 1 On Friday-Am 8)  Vitamin D3 1000 Unit Caps  (Cholecalciferol) .Marland Kitchen.. 1 By Mouth Two Times A Day 9)  Sulfamethoxazole-Trimethoprim 400-80 Mg Tabs (Sulfamethoxazole-Trimethoprim) .... As A Prevenative  Allergies (verified): 1)  ! Ace Inhibitors 2)  ! Codeine  Past History:  Past Medical History: Reviewed history from 05/25/2010 and no changes required. Hypertension Hyperlipidemia Gilbert's syndrome ACE-I cough GERD with stricture  Past Surgical History: Reviewed history from 05/25/2010 and no changes required. Hysterectomy Oophorectomycolonoscopy with polyps due 2011 breast biopsy times 6 with lumpectomy Colon polypectomy 2006 ,due 2011, Dr Leone Payor EGD: stricture 1996  Family History: Reviewed history from 09/30/2007 and no changes required. Family History Hypertension father MI age 70 mother deceased old age 19 two maternal aunts breast cancer sister htn sister breast cancer bilateral  Social History: Former Smoker Occupation: Retired Alcohol Use - no Illicit Drug Use - no Drug Use:  no  Review of Systems       The patient complains of muscle pains/cramps.         All other ROS negative except as per HPI.   Vital Signs:  Patient profile:   70 year old female Height:      64 inches Weight:      187.38 pounds BMI:     32.28 Pulse rate:   64 / minute Pulse rhythm:   regular BP sitting:   106 / 64  (left arm) Cuff size:   regular  Vitals Entered By: June McMurray CMA Duncan Dull) (June 06, 2010 11:49 AM)  Physical Exam  General:  obese.  NAD Eyes:  anicteric Mouth:  No deformity or lesions, dentition normal. Neck:  Supple; no masses or thyromegaly. Lungs:  Clear throughout to auscultation. Heart:  Normal rate and regular rhythm. S1 and S2 normal without gallop, murmur, click, rub or other extra sounds. Abdomen:  Bowel sounds positive,abdomen soft  slightly tender  epigastrium without masses, organomegaly or hernias noted. Rectal:  deferred until time of colonoscopy.   Extremities:  No clubbing,  cyanosis, edema or deformities noted. Skin:  Intact without suspicious lesions or rashes. No jaundice Cervical Nodes:  No significant cervical or supraclavicular adenopathy.  Psych:  Alert and cooperative. Normal mood and affect.   Impression & Recommendations:  Problem # 1:  DYSPEPSIA (ICD-536.8) Assessment Comment Only New to me: Evaluate with EGD, ? gastritis, ulcer, GERD, other Risks, benefits,and indications of endoscopic procedure(s) were reviewed with the patient and all questions answered.  Problem # 2:  OTHER DYSPHAGIA (ICD-787.29) Assessment: Comment Only NEW: evaluate for esophageal stricture and possibly dilate Risks, benefits,and indications of endoscopic procedure(s) were reviewed with the patient and all questions answered.  Orders: Colon/Endo (Colon/Endo)  Problem # 3:  DIARRHEA (ICD-787.91) Assessment: New ? INS or IBS, other due for a colonoscopy for screening (primary indication) so will see what mucosa is like while screening  Problem # 4:  COLONIC POLYPS, BENIGN, HX OF (ICD-V12.72) Assessment: Unchanged  Risks, benefits,and indications of endoscopic procedure(s) were reviewed with the patient and all questions answered.  Orders: Colon/Endo (Colon/Endo)  Patient Instructions: 1)  Please pick up your medications at your pharmacy. MOVIPREP, PANTOPRAZOLE 2)  We will see you at your procedure on 06/23/10. 3)  Please begin taking Align once daily.  Samples provided. 4)  [facility] Patient Information Guide given to patient.  5)  Colonoscopy and Flexible Sigmoidoscopy brochure given.  6)  Upper Endoscopy brochure given.  7)  The medication list was reviewed and reconciled.  All changed / newly prescribed medications were explained.  A complete medication list was provided to the patient / caregiver. Prescriptions: MOVIPREP 100 GM  SOLR (PEG-KCL-NACL-NASULF-NA ASC-C) As per prep instructions.  #1 x 0   Entered by:   Francee Piccolo CMA  (AAMA)   Authorized by:   Iva Boop MD, [facility]   Signed by:   Francee Piccolo CMA (AAMA) on 06/06/2010   Method used:   Electronically to        [facility]* (retail)       [address]       [place], [place]  [id number]       Ph: [phone]       Fax: [phone]   RxID:   [id number] PANTOPRAZOLE SODIUM 40 MG TBEC (PANTOPRAZOLE SODIUM) 1 by mouth once daily 30 mins before a meal  #30 x 5   Entered and Authorized by:   Iva Boop MD, [facility]   Signed by:   Iva Boop MD, FACG on 06/06/2010   Method used:   Electronically to        [facility]* (retail)       [address]       [place], [place]  [id number]       Ph: [phone]       Fax: [phone]   RxID:   [id number]

## 2010-10-18 NOTE — Assessment & Plan Note (Signed)
Summary: bodyaches /leg pain/cbs   Vital Signs:  Patient profile:   70 year old female Weight:      188.6 pounds Temp:     98.5 degrees F oral Pulse rate:   64 / minute Resp:     16 per minute BP sitting:   126 / 78  (left arm) Cuff size:   large  Vitals Entered By: Shonna Chock (Jan 31, 2010 8:00 AM) CC: Body Aches/Leg Pain X 1.5 week(s) Comments REVIEWED MED LIST, PATIENT AGREED DOSE AND INSTRUCTION CORRECT    Primary Care Provider:  [name] MD  CC:  Body Aches/Leg Pain X 1.5 week(s).  History of Present Illness: Dull, burning & occasionally throbbing pain in thighs X 1.5 weeks.She was in [facility] 04/25-05/11/2009; they had driven their camper  but stopped every hour. It is fairly constant. No trigger or injury; stretches & Tylenol helped "some, 20%". Also "twinges " in knees. She feels " beat up all over". No joint pain ,swelling or redness. Intermittent sweats during the day > night; PMH of hot flashes which have resolved.She is on vitamin D 200 International Units two times a day with Ca++ 600 mg  two times a day.She is also on statin. No PMH of vit D deficiency. No steroid administration. FH: father OA of knees. Celebrex last year for knee pain of no benefit.  Allergies: 1)  ! Ace Inhibitors 2)  ! Codeine  Review of Systems General:  Complains of fatigue; denies chills and fever; Weight down 3 # over past week. CV:  Denies chest pain or discomfort and shortness of breath with exertion. Resp:  Denies chest pain with inspiration, coughing up blood, and shortness of breath. GI:  Denies abdominal pain, bloody stools, and dark tarry stools; No clay colored stool. GU:  No dark urine. UTI in 10/2009 treated by Urology.. MS:  Complains of loss of strength and muscle aches; denies low back pain, mid back pain, cramps, and thoracic pain. Derm:  Denies insect bite(s), lesion(s), and rash; PMH of Rosacea. No tick exposure. Neuro:  Denies brief paralysis, numbness, and  tingling. Heme:  Denies abnormal bruising and bleeding.  Physical Exam  General:  well-nourished,in no acute distress; alert,appropriate and cooperative throughout examination Eyes:  No icterus Neck:  No deformities, masses, or tenderness noted. Lungs:  Normal respiratory effort, chest expands symmetrically. Lungs are clear to auscultation, no crackles or wheezes. Heart:  Normal rate and regular rhythm. S1 and S2 normal without gallop, murmur, click, rub .S4 Abdomen:  Bowel sounds positive,abdomen soft and non-tender without masses, organomegaly or hernias noted. Pulses:  R and L carotid,radial,dorsalis pedis and posterior tibial pulses are full and equal bilaterally Extremities:  No clubbing, cyanosis, edema, or deformity noted with normal full range of motion of all joints.  Tender to compression of thighs. Neg Homan's Neurologic:  alert & oriented X3, strength normal in all extremities, gait normal, and DTRs symmetrical and normal.   Skin:  Very mild Rosacea periorbitally. Skin damp. No jaundice Cervical Nodes:  No lymphadenopathy noted Axillary Nodes:  No palpable lymphadenopathy Psych:  memory intact for recent and remote, normally interactive, and good eye contact.     Impression & Recommendations:  Problem # 1:  MUSCLE PAIN (ICD-729.1)  The following medications were removed from the medication list:    Celebrex 200 Mg Caps (Celecoxib) .Marland Kitchen... Prn  Orders: Venipuncture ([address]) TLB-CK Total Only(Creatine Kinase/CPK) (82550-CK) TLB-Sedimentation Rate (ESR) (85652-ESR) T-Vitamin D (25-Hydroxy) (60454-09811)  Problem # 2:  FATIGUE (  ICD-780.79)  Orders: Venipuncture ([address]) TLB-Hepatic/Liver Function Pnl (80076-HEPATIC) TLB-BMP (Basic Metabolic Panel-BMET) (80048-METABOL) TLB-CBC Platelet - w/Differential (85025-CBCD) TLB-TSH (Thyroid Stimulating Hormone) (84443-TSH)  Problem # 3:  SWEATING (ICD-780.8)  Orders: Venipuncture (81191) TLB-CBC Platelet - w/Differential  (85025-CBCD)  Complete Medication List: 1)  Diovan Hct 320-12.5 Mg Tabs (Valsartan-hydrochlorothiazide) .... 1/2 by mouth qam 1/2 pm 2)  Metformin Hcl 500 Mg Tabs (Metformin hcl) .Marland Kitchen.. 1 by mouth two times a day with largest meals 3)  Pravastatin Sodium 20 Mg Tabs (Pravastatin sodium) .... 1/2 at bedtime 4)  Multivitamin  .... Qd 5)  Calcium With Vit D 600mg   .... Bid 6)  Fish Oil Concentrate  .... 1000mg  bid 7)  Clonidine Hcl 0.2 Mg Tabs (Clonidine hcl) .Marland Kitchen.. 1 bid 8)  Oxytrol 3.9 Mg/24hr Pttw (Oxybutynin) .Marland Kitchen.. 1 on mondays-pm, 1 on friday-am  Patient Instructions: 1)  Hold Pravastatin until labs back. Add vitamin D3 1000 International Units once daily to present dose.

## 2010-10-18 NOTE — Progress Notes (Signed)
Summary: refill  Phone Note Refill Request Message from:  Fax from Pharmacy on July 19, 2010 9:18 AM  Refills Requested: Medication #1:  DIOVAN HCT 320-12.5 MG  TABS 1/2 by mouth qam 1/2 pm [place] - fax [phone]  Initial call taken by: Okey Regal Spring,  July 19, 2010 9:18 AM    Prescriptions: DIOVAN HCT 320-12.5 MG  TABS (VALSARTAN-HYDROCHLOROTHIAZIDE) 1/2 by mouth qam 1/2 pm  #30 x 5   Entered by:   Shonna Chock CMA   Authorized by:   [name] MD   Signed by:   Shonna Chock CMA on 07/19/2010   Method used:   Electronically to        [facility]* (retail)       [address]       [place], [place]  [id number]       Ph: [phone]       Fax: [phone]   RxID:   [id number]

## 2010-10-18 NOTE — Letter (Signed)
Summary: [facility] Instructions  [facility]  [address] [place], [place] [address]   Phone: [phone]  Fax: [phone]       [name]    07-14-1941    MRN: [id number]      Procedure Day Dorna Bloom: Lenor Coffin, 06/23/10     Arrival Time: 2:30 PM      Procedure Time: 3:30 PM    Location of Procedure:                    _X_  [facility] (4th Floor)  PREPARATION FOR COLONOSCOPY WITH MOVIPREP   Starting 5 days prior to your procedure 06/18/10 do not eat nuts, seeds, popcorn, corn, beans, peas,  salads, or any raw vegetables.  Do not take any fiber supplements (e.g. Metamucil, Citrucel, and Benefiber).  THE DAY BEFORE YOUR PROCEDURE         WEDNESDAY, 06/22/10  1.  Drink clear liquids the entire day-NO SOLID FOOD  2.  Do not drink anything colored red or purple.  Avoid juices with pulp.  No orange juice.  3.  Drink at least 64 oz. (8 glasses) of fluid/clear liquids during the day to prevent dehydration and help the prep work efficiently.  CLEAR LIQUIDS INCLUDE: Water Jello Ice Popsicles Tea (sugar ok, no milk/cream) Powdered fruit flavored drinks Coffee (sugar ok, no milk/cream) Gatorade Juice: apple, white grape, white cranberry  Lemonade Clear bullion, consomm, broth Carbonated beverages (any kind) Strained chicken noodle soup Hard Candy                           4.  In the morning, mix first dose of MoviPrep solution:    Empty 1 Pouch A and 1 Pouch B into the disposable container    Add lukewarm drinking water to the top line of the container. Mix to dissolve    Refrigerate (mixed solution should be used within 24 hrs)  5.  Begin drinking the prep at 5:00 p.m. The MoviPrep container is divided by 4 marks.   Every 15 minutes drink the solution down to the next mark (approximately 8 oz) until the full liter is complete.   6.  Follow completed prep with 16 oz of clear liquid of your choice (Nothing red or purple).  Continue to drink  clear liquids until bedtime.  7.  Before going to bed, mix second dose of MoviPrep solution:    Empty 1 Pouch A and 1 Pouch B into the disposable container    Add lukewarm drinking water to the top line of the container. Mix to dissolve    Refrigerate  THE DAY OF YOUR PROCEDURE      THURSDAY, 06/23/10  Beginning at 10:30 a.m. (5 hours before procedure):         1. Every 15 minutes, drink the solution down to the next mark (approx 8 oz) until the full liter is complete.  2. Follow completed prep with 16 oz. of clear liquid of your choice.    3. You may drink clear liquids until 1:30 PM (2 HOURS BEFORE PROCEDURE).  MEDICATION INSTRUCTIONS  Unless otherwise instructed, you should take regular prescription medications with a small sip of water   as early as possible the morning of your procedure.  Diabetic patients - see separate instructions.       OTHER INSTRUCTIONS  You will need a responsible adult at least 69 years of age to accompany you  and drive you home.   This person must remain in the waiting room during your procedure.  Wear loose fitting clothing that is easily removed.  Leave jewelry and other valuables at home.  However, you may wish to bring a book to read or  an iPod/MP3 player to listen to music as you wait for your procedure to start.  Remove all body piercing jewelry and leave at home.  Total time from sign-in until discharge is approximately 2-3 hours.  You should go home directly after your procedure and rest.  You can resume normal activities the  day after your procedure.  The day of your procedure you should not:   Drive   Make legal decisions   Operate machinery   Drink alcohol   Return to work  You will receive specific instructions about eating, activities and medications before you leave.  The above instructions have been reviewed and explained to me by   Francee Piccolo, CMA (AAMA)    I fully understand and can verbalize  these instructions _____________________________ Date 69/19/11

## 2010-10-18 NOTE — Progress Notes (Signed)
Summary: Refill Request  Phone Note Refill Request Call back at [phone] Message from:  Pharmacy on January 10, 2010 12:49 PM  Refills Requested: Medication #1:  DIOVAN HCT 320-12.5 MG  TABS 1/2 by mouth qam 1/2 pm   Dosage confirmed as above?Dosage Confirmed   Supply Requested: 1 month   Last Refilled: 12/11/2009 [facility]  Next Appointment Scheduled: none Initial call taken by: Harold Barban,  January 10, 2010 12:50 PM    Prescriptions: DIOVAN HCT 320-12.5 MG  TABS (VALSARTAN-HYDROCHLOROTHIAZIDE) 1/2 by mouth qam 1/2 pm  #30 x 5   Entered by:   Shonna Chock   Authorized by:   [name] MD   Signed by:   Shonna Chock on 01/10/2010   Method used:   Electronically to        [facility]* (retail)       [address]       [place], [place]  [id number]       Ph: [phone]       Fax: [phone]   RxID:   [id number]

## 2010-10-18 NOTE — Assessment & Plan Note (Signed)
Summary: flu shot/cbs   Nurse Visit   Allergies: 1)  ! Ace Inhibitors 2)  ! Codeine  Orders Added: 1)  Flu Vaccine 60yrs + MEDICARE PATIENTS [Q2039] 2)  Administration Flu vaccine - MCR [G0008] Flu Vaccine Consent Questions     Do you have a history of severe allergic reactions to this vaccine? no    Any prior history of allergic reactions to egg and/or gelatin? no    Do you have a sensitivity to the preservative Thimersol? no    Do you have a past history of Guillan-Barre Syndrome? no    Do you currently have an acute febrile illness? no    Have you ever had a severe reaction to latex? no    Vaccine information given and explained to patient? yes    Are you currently pregnant? no    Lot Number:[id number]   Exp Date:03/18/2011   Site Given  Left Deltoid [email or web address]

## 2010-10-18 NOTE — Letter (Signed)
Summary: Diabetic Instructions  [facility]  [address] [place], [place] [address]   Phone: [phone]  Fax: [phone]    MURL ZOGG [birth year] MRN: [id number]   _x_   ORAL DIABETIC MEDICATION INSTRUCTIONS  The day before your procedure:   Take your diabetic pill as you do normally  The day of your procedure:   Do not take your diabetic pill    We will check your blood sugar levels during the admission process and again in Recovery before discharging you home

## 2010-10-18 NOTE — Progress Notes (Signed)
Summary: Triage   Phone Note Call from Patient Call back at Home Phone [phone]   Caller: Patient Call For: Dr Leone Payor Reason for Call: Talk to Nurse Summary of Call: Patient would like somthing called in for severe indigestion and Nexium is not helping. Initial call taken by: Tawni Levy,  June 02, 2010 11:55 AM  Follow-up for Phone Call        Pt. last seen for a Colonoscopy by Dr.Bethalto on 04-06-2005. Has an appt. scheduled to see Dr.Gessner on 07-13-10.  Pt. c/o "Extreme heartburn." Saw Dr.Hopper last week and was given Nexium 40mg  bid daily.  Pt. states the Nexium has improved her heartburn, but it is still present, causing nausea and heartburn.  1) See Dr.Gessner on 06-06-10 at 11:30am 2) Continue Nexium as directed 3) May use Mylanta, gaviscon,etc. as needed 4) Soft,bland diet. No spicy,greasy,fried foods.  5) If symptoms become worse call back immediately or go to ER.   Follow-up by: Laureen Ochs LPN,  June 02, 2010 12:17 PM

## 2010-10-18 NOTE — Op Note (Signed)
Summary: Digital Mammogram & Stereotactic Biopsy/[facility]  Digital Mammogram & Stereotactic Biopsy/[facility]   Imported By: Lanelle Bal 08/02/2010 11:10:32  _____________________________________________________________________  External Attachment:    Type:   Image     Comment:   External Document

## 2010-10-18 NOTE — Letter (Signed)
Summary: Results Follow up Letter  [facility] [place]  [address] [place], [place] [address]   Phone: [phone]  Fax: [phone]    05/31/2010 MRN: [id number]  [name] [address] [place], [place]  [address]  Dear Ms. [name],  The following are the results of your recent test(s):  Test         Result    Pap Smear:        Normal _____  Not Normal _____ Comments: ______________________________________________________ Cholesterol: LDL(Bad cholesterol):         Your goal is less than:         HDL (Good cholesterol):       Your goal is more than: Comments:  ______________________________________________________ Mammogram:        Normal _____  Not Normal _____ Comments:  ___________________________________________________________________ Hemoccult:        Normal _X___  Not normal _______ Comments:    _____________________________________________________________________ Other Tests:    We routinely do not discuss normal results over the telephone.  If you desire a copy of the results, or you have any questions about this information we can discuss them at your next office visit.   Sincerely,

## 2010-10-18 NOTE — Procedures (Signed)
Summary: Colonoscopy: Dr. Doreatha Martin:    Colonoscopy  Procedure date:  04/06/2005  Findings:      Results: Polyp.  Location:  [facility].   ***MICROSCOPIC EXAMINATION AND DIAGNOSIS***    COLON, ASCENDING, POLYP: POLYPOID FRAGMENT OF BENIGN COLONIC MUCOSA WITH REACTIVE LYMPHOID AGGREGATE. NO ADENOMATOUS CHANGE OR MALIGNANCY IDENTIFIED.  Comments:      Repeat colonoscopy in 5 years.   Procedures Next Due Date:    Colonoscopy: 04/2010  Patient Name: Reiana, Poteet MRN:  Procedure Procedures: Colonoscopy CPT: 45409.  Personnel: Endoscopist: Ulyess Mort, MD.  Exam Location: Exam performed in Outpatient Clinic. Outpatient  Patient Consent: Procedure, Alternatives, Risks and Benefits discussed, consent obtained, from patient. Consent was obtained by the RN.  Indications  Average Risk Screening Routine.  History  Current Medications: Patient is not currently taking Coumadin.  Pre-Exam Physical: Entire physical exam was normal.  Exam Exam: Extent of exam reached: Cecum, extent intended: Cecum.  The cecum was identified by appendiceal orifice and IC valve. Colon retroflexion performed. Images were not taken. ASA Classification: II. Tolerance: good.  Monitoring: Pulse and BP monitoring, Oximetry used. Supplemental O2 given.  Colon Prep Prep results: good.  Sedation Meds: Patient assessed and found to be appropriate for moderate (conscious) sedation. Fentanyl 75 mcg. given IV. Versed 7 mg. given IV.  Findings POLYP: Ascending Colon, Maximum size: 2 mm. sessile polyp. Procedure:  hot biopsy, removed, not retrieved, ICD9: Colon Polyps: 211.3.  POLYP: Ascending Colon, Maximum size: 2 mm. sessile polyp. Procedure:  hot biopsy, removed, not retrieved,  POLYP: Ascending Colon, Maximum size: 2 mm. sessile polyp. Procedure:  hot biopsy, removed, not retrieved,  POLYP: Ascending Colon, Maximum size: 3 mm. sessile polyp. Procedure:  hot biopsy, removed,  retrieved, Polyp sent to pathology.  - OTHER FINDING: Rectum. Comments: small rectocoele.   Assessment Abnormal examination, see findings above.  Diagnoses: 211.3: Colon Polyps.   Events  Unplanned Interventions: No intervention was required.  Unplanned Events: There were no complications. Plans Patient Education: Patient given standard instructions for: Polyps. Yearly hemoccult testing recommended. Patient instructed to get routine colonoscopy every 5 years.  Disposition: After procedure patient sent to recovery. After recovery patient sent home.   CC:   [name], MD  This report was created from the original endoscopy report, which was reviewed and signed by the above listed endoscopist.

## 2010-10-18 NOTE — Procedures (Signed)
Summary: Upper Endoscopy  Patient: [name] Note: All result statuses are Final unless otherwise noted.  Tests: (1) Upper Endoscopy (EGD)   EGD Upper Endoscopy       DONE      [facility]     [address]     [place], [place]  [address]           ENDOSCOPY PROCEDURE REPORT           PATIENT:  [name], [name]  MR#:  [id number]     BIRTHDATE:  [birth year], 69 yrs. old  GENDER:  female           ENDOSCOPIST:  Iva Boop, MD, [facility]           PROCEDURE DATE:  06/23/2010     PROCEDURE:  EGD, diagnostic, Maloney Dilation of Esophagus     ASA CLASS:  Class II     INDICATIONS:  dysphagia           MEDICATIONS:   Fentanyl 50 mcg, Versed 7 mg     TOPICAL ANESTHETIC:  Exactacain Spray           DESCRIPTION OF PROCEDURE:   After the risks benefits and     alternatives of the procedure were thoroughly explained, informed     consent was obtained.  The [facility] [id number] endoscope was     introduced through the mouth and advanced to the second portion of     the duodenum, without limitations.  The instrument was slowly     withdrawn as the mucosa was fully examined.     <<PROCEDUREIMAGES>>           An esophageal ring was found at the gastroesophageal junction.     Otherwise the examination was normal.    Retroflexed views revealed     no abnormalities.    The scope was then withdrawn from the     patient, a 64 french Maloney dilator was passed easily and without     heme on the dilator, and the procedure completed.           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 Normal foods     tomorrow.           REPEAT EXAM:  In for as needed.           Iva Boop, MD, [facility] Graham       CC:  Pecola Lawless, MD and The Patient           n.     eSIGNED:   Iva Boop at 06/23/2010 04:22 PM           Roland Rack, [id number]  Note: An exclamation mark (!) indicates a result that was not dispersed into the flowsheet. Document Creation Date: 06/23/2010 4:22 PM _______________________________________________________________________  (1) Order result status: Final Collection or observation date-time: 06/23/2010 15:57 Requested date-time:  Receipt date-time:  Reported date-time:  Referring Physician:   Ordering Physician: Stan Head [id number]) Specimen Source:  Source: Launa Grill Order Number: [id number] Lab  site:

## 2010-10-19 ENCOUNTER — Encounter (INDEPENDENT_AMBULATORY_CARE_PROVIDER_SITE_OTHER): Payer: Self-pay | Admitting: *Deleted

## 2010-10-20 NOTE — Letter (Signed)
Summary: [facility]   Imported By: Lanelle Bal 08/29/2010 10:47:31  _____________________________________________________________________  External Attachment:    Type:   Image     Comment:   External Document

## 2010-10-20 NOTE — Progress Notes (Signed)
Summary: metformin refill again  Phone Note Refill Request Message from:  Fax from Pharmacy on October 12, 2010 2:43 PM  Refills Requested: Medication #1:  METFORMIN HCL 500 MG  TABS 1 by mouth two times a day with largest meals  **LABS DUE NOW** [facility] [phone], [address] [place], [place], [place]  phone--not listed, Fax  = [phone]   qty =180      phone note dated 1/19-20 shows this medication was ordered, but only for 60---we have received another fax this afternoon for qty - 180     (I will call patient to set up future lab appt)  Initial call taken by: Jerolyn Shin,  October 12, 2010 2:45 PM  Follow-up for Phone Call        I called the pharmacy and left message informing them patient given # 60 because she is due for labs. Additional refills to be given when labs completed Follow-up by: Shonna Chock CMA,  October 13, 2010 9:07 AM

## 2010-10-20 NOTE — Letter (Signed)
Summary: [facility]  [facility]   Imported By: Lanelle Bal 09/14/2010 10:16:55  _____________________________________________________________________  External Attachment:    Type:   Image     Comment:   External Document

## 2010-10-20 NOTE — Assessment & Plan Note (Signed)
Summary: temp-body ache/cbs   Vital Signs:  Patient profile:   70 year old female Height:      64 inches (162.56 cm) Weight:      188 pounds (85.45 kg) BMI:     32.39 Temp:     98.6 degrees F (37.00 degrees C) oral Resp:     15 per minute BP sitting:   122 / 80  (left arm)  Vitals Entered By: Lucious Groves CMA (September 02, 2010 12:54 PM) CC: Possible URI x3 days./kb, URI symptoms Is Patient Diabetic? Yes Comments Patient notes that she has been having hoarseness, fever, body ache, HA, and slight nasal congestion. She denies cough, mucous production, SOB, and chest pain.   Primary Care Provider:  [name] MD  CC:  Possible URI x3 days./kb and URI symptoms.  History of Present Illness:      This is a 70 year old woman who presents with  with low grade fever since 12/13.  The patient denies nasal congestion, purulent nasal discharge, sore throat, productive cough, and earache.  Only  symptoms  are the   low-grade fever (<100.5 degrees).  The patient denies dyspnea and wheezing.  The patient denies headache.  The patient denies the following risk factors for Strep sinusitis: unilateral facial pain, tooth pain, and tender adenopathy.  She had Flu shot 08/19/2010. Radical mastectomy planned in 09/2010 for recently diagnosed cancer. .  Current Medications (verified): 1)  Diovan Hct 320-12.5 Mg  Tabs (Valsartan-Hydrochlorothiazide) .... 1/2 By Mouth Qam 1/2 Pm 2)  Metformin Hcl 500 Mg  Tabs (Metformin Hcl) .Marland Kitchen.. 1 By Mouth Two Times A Day With Largest Meals 3)  Multivitamin .... Qd 4)  Calcium With Vit D 600mg  .... Bid 5)  Fish Oil Concentrate .... 1000mg  Bid 6)  Clonidine Hcl 0.2 Mg  Tabs (Clonidine Hcl) .Marland Kitchen.. 1 Bid 7)  Oxytrol 3.9 Mg/24hr Pttw (Oxybutynin) .Marland Kitchen.. 1 On Mondays-Pm, 1 On Friday-Am 8)  Vitamin D3 1000 Unit Caps (Cholecalciferol) .Marland Kitchen.. 1 By Mouth Two Times A Day 9)  Sulfamethoxazole-Trimethoprim 400-80 Mg Tabs (Sulfamethoxazole-Trimethoprim) .... As A Prevenative 10)   Pantoprazole Sodium 40 Mg Tbec (Pantoprazole Sodium) .Marland Kitchen.. 1 By Mouth Once Daily 30 Mins Before A Meal 11)  Align  Caps (Probiotic Product) .... Take 1 Capsule By Mouth Once A Day  Allergies (verified): 1)  ! Ace Inhibitors 2)  ! 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 Resp:  Denies chest pain with inspiration and coughing up blood. 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 GU:  Denies discharge, dysuria, and hematuria. MS:  Complains of joint pain; denies joint redness and joint swelling; Knees,hips , shouders ache . Derm:  Denies lesion(s) and rash. Heme:  Denies abnormal bruising and bleeding.  Physical Exam  General:  well-nourished,in no acute distress; alert,appropriate and cooperative throughout examination Ears:  External ear exam shows no significant lesions or deformities.  Otoscopic examination reveals clear canals, tympanic membranes are intact bilaterally without bulging, retraction,  inflammation or discharge. Hearing is grossly normal bilaterally. Nose:  External nasal examination shows no deformity or inflammation. Nasal mucosa are pink and moist without lesions or exudates. Mouth:  Oral mucosa and oropharynx without lesions or exudates.  Teeth in good repair. Lungs:  Normal respiratory effort, chest expands symmetrically. Lungs are clear to auscultation, no crackles or  wheezes. Heart:  Normal rate and regular rhythm. S1 and S2 normal without gallop, murmur, click, rub.Soft S4 Abdomen:  Bowel sounds positive,abdomen soft and non-tender without masses, organomegaly or hernias noted. Extremities:  No clubbing, cyanosis, edema, or deformity noted with normal full range of motion of all joints.   Mild creitus of knees & minimally  shoulder. Skin:  Intact without suspicious lesions or rashes Cervical Nodes:  No lymphadenopathy noted Axillary Nodes:  No palpable lymphadenopathy   Impression & Recommendations:  Problem # 1:  FEVER (ICD-780.60)  Orders: Venipuncture (86578) TLB-CBC Platelet - w/Differential (85025-CBCD) TLB-ALT (SGPT) (84460-ALT) TLB-AST (SGOT) (84450-SGOT) Specimen Handling (46962)  Problem # 2:  ARTHRALGIA (ICD-719.40)  diffuse  Orders: Venipuncture (95284) TLB-Sedimentation Rate (ESR) (85652-ESR) T-Rheumatoid Factor (13244-01027) Specimen Handling (25366)  Complete Medication List: 1)  Diovan Hct 320-12.5 Mg Tabs (Valsartan-hydrochlorothiazide) .... 1/2 by mouth qam 1/2 pm 2)  Metformin Hcl 500 Mg Tabs (Metformin hcl) .Marland Kitchen.. 1 by mouth two times a day with largest meals 3)  Multivitamin  .... Qd 4)  Calcium With Vit D 600mg   .... Bid 5)  Fish Oil Concentrate  .... 1000mg  bid 6)  Clonidine Hcl 0.2 Mg Tabs (Clonidine hcl) .Marland Kitchen.. 1 bid 7)  Oxytrol 3.9 Mg/24hr Pttw (Oxybutynin) .Marland Kitchen.. 1 on mondays-pm, 1 on friday-am 8)  Vitamin D3 1000 Unit Caps (Cholecalciferol) .Marland Kitchen.. 1 by mouth two times a day 9)  Sulfamethoxazole-trimethoprim 400-80 Mg Tabs (Sulfamethoxazole-trimethoprim) .... As a prevenative 10)  Pantoprazole Sodium 40 Mg Tbec (Pantoprazole sodium) .Marland Kitchen.. 1 by mouth once daily 30 mins before a meal 11)  Align Caps (Probiotic product) .... Take 1 capsule by mouth once a day 12)  Celebrex 200 Mg Caps (Celecoxib) .Marland Kitchen.. 1 two times a day as needed for joint pain  Patient Instructions: 1)  Monitor for localizing signs as  discussed. Prescriptions: CELEBREX 200 MG CAPS (CELECOXIB) 1 two times a day as needed for joint pain  #12 x 0   Entered and Authorized by:   [name] MD   Signed by:   [name] MD on 09/02/2010   Method used:   Samples Given   RxID:   [id number]    Orders Added: 1)  Est. Patient Level III [64332] 2)  Venipuncture [95188] 3)  TLB-CBC Platelet - w/Differential [85025-CBCD] 4)  TLB-ALT (SGPT) [84460-ALT] 5)  TLB-AST (SGOT) [84450-SGOT] 6)  TLB-Sedimentation Rate (ESR) [85652-ESR] 7)  T-Rheumatoid Factor [41660-63016] 8)  Specimen Handling [99000]

## 2010-10-20 NOTE — Progress Notes (Signed)
Summary: Lab Results  Phone Note Outgoing Call Call back at Home Phone [phone]   Call placed by: Shonna Chock CMA,  September 07, 2010 10:31 AM Call placed to: Patient Summary of Call: Spoke with patient:  Both liver enzymes are slightly higher;but probably not significantly so. The  hepatitis panel is pending. Please report any change in symptoms or signs.Ultrasound of liver will be done if hepatitis studies are negative. Hopp  Good; hepatitis profile negative.Repeat fasting liver panel in 7-10 days. See me before that if fever is persisting. Hopp  Scheduled appointment for 09/14/2010 at 8:20am./Chrae [facility] CMA  September 07, 2010 10:35 AM

## 2010-10-20 NOTE — Miscellaneous (Signed)
Summary: Orders Update   Clinical Lists Changes  Orders: Added new Referral order of Radiology Referral (Radiology) - Signed 

## 2010-10-20 NOTE — Progress Notes (Signed)
Summary: U/S Results   Phone Note Outgoing Call Call back at [facility] Phone [phone]   Call placed by: Shonna Chock CMA,  September 16, 2010 3:32 PM Call placed to: Patient Details for Reason: U/S Results  Summary of Call: Spoke with patient Only fatty infiltration of liver  seen ; this is usually due to excess High Fructose Corn Syrup in diet. Recheck fasting AST & ALT in 1 week ; liver enzymes were decreasing.  Schedule appointment for 09/23/2010 to recheck liver function test  Shonna Chock CMA  September 16, 2010 3:32 PM

## 2010-10-20 NOTE — Letter (Signed)
Summary: [facility]  [facility]   Imported By: Lanelle Bal 09/08/2010 12:34:20  _____________________________________________________________________  External Attachment:    Type:   Image     Comment:   External Document

## 2010-10-20 NOTE — Progress Notes (Signed)
Summary: Lab Results  Phone Note Outgoing Call Call back at [facility] Phone [phone]   Call placed by: Shonna Chock CMA,  September 05, 2010 8:49 AM Call placed to: Patient Summary of Call: Spoke with patient: Sed rate assesses inflammation; mild inflammation is present. Both liver function tests are  elevated. Please recheck fasting liver panel with acute hepatitis panel. (Codes: 780.6,790.4)  Screening test for Rheumatoid Arthritis is normal.    Copy of labs placed at the front Chrae [facility] CMA  September 05, 2010 8:49 AM

## 2010-10-20 NOTE — Letter (Signed)
Summary: [facility]   Imported By: Lanelle Bal 10/07/2010 09:03:25  _____________________________________________________________________  External Attachment:    Type:   Image     Comment:   External Document

## 2010-10-20 NOTE — Letter (Signed)
Summary: [facility]  [facility]   Imported By: Lanelle Bal 09/21/2010 11:05:29  _____________________________________________________________________  External Attachment:    Type:   Image     Comment:   External Document

## 2010-10-21 NOTE — Discharge Summary (Signed)
  NAMENICO, ROGNESS            ACCOUNT NO.:  [id number]  MEDICAL RECORD NO.:  [id number]          PATIENT TYPE:  INP  LOCATION:  5120                         FACILITY:  [facility]  PHYSICIAN:  Wayland Denis, DO      DATE OF BIRTH:  [birth year]  DATE OF ADMISSION:  10/12/2010 DATE OF DISCHARGE:  10/14/2010                              DISCHARGE SUMMARY   ADMITTING DIAGNOSIS:  Breast cancer.  DISCHARGE DIAGNOSIS:  Breast cancer.  PROCEDURES:  Bilateral mastectomy with right sentinel lymph node biopsy, immediate reconstruction with expander and AlloDerm placement.  SURGEONS: 1. Currie Paris, MD 2. Claire Sanger, DO.  DESCRIPTION OF HOSPITAL COURSE:  Ms. Mcclafferty is a 70 year old female who was diagnosed with breast cancer and underwent bilateral mastectomies by Dr. Jamey Ripa on October 12, 2010, and immediate reconstruction with expander and AlloDerm placement by [employer]. She was admitted to the surgery unit postoperatively.  Her vitals were stable.  She had some tachycardia that was managed with fluid and her home meds and Lopressor.  She responded well and was asymptomatic.  She was able to tolerate food well and was heplocked with her IV.  Her food was advanced to a regular diet.  She was ambulatory, voiding without any difficulty, and her pain was well managed in the hospital with intermittent Dilaudid.  She has a CODEINE allergy and states she was unable to take the Vicodin, so Aleve was started.  Her flaps were warm and dry.  She had some mild bruising on the left with some purple around the incision on the upper flaps site in the middle portion of the incision.  Instructions were given on care for this.  She was discharged to home on postop day 2 with her husband with the following medications: 1. Keflex 500 mg one p.o. q.6 h. for 10 days. 2. Colace 100 mg one p.o. q.8 h. 3. Valium 2 mg one p.o. q.12 h. 4. Naproxen 600 mg one p.o. q.8 h. 5. Zofran 4 mg one p.o.  q.6 h. p.r.n. nausea, vomiting.  She will follow up with Dr. Jamey Ripa in 2 weeks and Dr. Kelly Splinter in 1 week.     Claire Sanger, DO   ______________________________ Wayland Denis, DO    CS/MEDQ  D:  10/14/2010  T:  10/14/2010  Job:  [id number]  Electronically Signed by Wayland Denis  on 10/21/2010 08:47:38 AM

## 2010-10-25 ENCOUNTER — Encounter: Payer: Self-pay | Admitting: Internal Medicine

## 2010-10-25 ENCOUNTER — Other Ambulatory Visit: Payer: Self-pay

## 2010-10-26 ENCOUNTER — Encounter: Payer: Self-pay | Admitting: Internal Medicine

## 2010-10-26 ENCOUNTER — Other Ambulatory Visit: Payer: Self-pay | Admitting: Internal Medicine

## 2010-10-26 ENCOUNTER — Encounter (INDEPENDENT_AMBULATORY_CARE_PROVIDER_SITE_OTHER): Payer: Self-pay | Admitting: *Deleted

## 2010-10-26 ENCOUNTER — Other Ambulatory Visit (INDEPENDENT_AMBULATORY_CARE_PROVIDER_SITE_OTHER): Payer: Medicare Other

## 2010-10-26 DIAGNOSIS — E785 Hyperlipidemia, unspecified: Secondary | ICD-10-CM

## 2010-10-26 DIAGNOSIS — E119 Type 2 diabetes mellitus without complications: Secondary | ICD-10-CM

## 2010-10-26 DIAGNOSIS — R740 Nonspecific elevation of levels of transaminase and lactic acid dehydrogenase [LDH]: Secondary | ICD-10-CM

## 2010-10-26 LAB — LIPID PANEL
Cholesterol: 169 mg/dL (ref 0–200)
HDL: 32.1 mg/dL — ABNORMAL LOW (ref >39.00)
Total CHOL/HDL Ratio: 5
Triglycerides: 211 mg/dL — ABNORMAL HIGH (ref 0.0–149.0)

## 2010-10-26 LAB — LDL CHOLESTEROL, DIRECT: Direct LDL: 108.8 mg/dL

## 2010-10-26 LAB — AST: AST: 47 U/L — ABNORMAL HIGH (ref 0–37)

## 2010-10-26 LAB — ALT: ALT: 49 U/L — ABNORMAL HIGH (ref 0–35)

## 2010-10-26 NOTE — Progress Notes (Signed)
Summary: needs fasting labs    lmom 1/25  sent letter 2/1  Phone Note Call from Patient   Caller: Patient Summary of Call: patient has refill for one month of metformin instead of three months---needs labs---LMOM for her number ([phone]) as well as spouse [phone]   needs        a1c, lipids, ast, alt   272.4, 250.00, 790.4 Initial call taken by: Jerolyn Shin,  October 12, 2010 3:02 PM  Follow-up for Phone Call        Please review the refill for metformin from 10/06/2010. I indicated what labs are needed Follow-up by: Shonna Chock CMA,  October 13, 2010 9:05 AM  Additional Follow-up for Phone Call Additional follow up Details #1::        sent letter--will close phone note Additional Follow-up by: Jerolyn Shin,  October 19, 2010 3:23 PM

## 2010-10-26 NOTE — Letter (Signed)
Summary: Primary Care Appointment Letter  [facility] [place]  [address] [place], [place] [address]   Phone: [phone]  Fax: [phone]    10/19/2010 MRN: [id number]  [facility] [address] [place], [place]  [address]  Dear [name],   Your Primary Care Physician [name] MD has indicated that:    _______it is time to schedule an appointment.    _______you missed your appointment on______ and need to call and          reschedule.    _______you need to have a lab appointment  (a1c, lipid, ast, alt, 272.4, 250.00, 790.4) because we filled your Metformin for only one month.   Please refer to this letter when making your appointment.    _______you need to schedule an appointment discuss lab or test results.    _______you need to call to reschedule your appointment that is                       scheduled on _________.     Please call our office as soon as possible. Our phone number is [phone]. Our office is open 8a-12noon and 1p-5p, Monday through Friday.     Thank you,    [place] Scheduler

## 2010-11-03 NOTE — Letter (Signed)
Summary: [facility]  [facility]   Imported By: Maryln Gottron 10/25/2010 10:09:11  _____________________________________________________________________  External Attachment:    Type:   Image     Comment:   External Document

## 2010-11-04 ENCOUNTER — Other Ambulatory Visit: Payer: Self-pay | Admitting: Oncology

## 2010-11-04 ENCOUNTER — Encounter: Payer: Medicare Other | Admitting: Oncology

## 2010-11-04 ENCOUNTER — Encounter (HOSPITAL_BASED_OUTPATIENT_CLINIC_OR_DEPARTMENT_OTHER): Payer: Medicare Other | Admitting: Oncology

## 2010-11-04 ENCOUNTER — Encounter: Payer: Self-pay | Admitting: Internal Medicine

## 2010-11-04 DIAGNOSIS — C50419 Malignant neoplasm of upper-outer quadrant of unspecified female breast: Secondary | ICD-10-CM

## 2010-11-04 LAB — CBC WITH DIFFERENTIAL/PLATELET
Basophils Absolute: 0 10*3/uL (ref 0.0–0.1)
Eosinophils Absolute: 0.3 10*3/uL (ref 0.0–0.5)
HGB: 11.2 g/dL — ABNORMAL LOW (ref 11.6–15.9)
MCV: 87.8 fL (ref 79.5–101.0)
MONO#: 0.7 10*3/uL (ref 0.1–0.9)
MONO%: 12 % (ref 0.0–14.0)
NEUT#: 2.9 10*3/uL (ref 1.5–6.5)
RBC: 3.7 10*6/uL (ref 3.70–5.45)
RDW: 14 % (ref 11.2–14.5)
WBC: 5.5 10*3/uL (ref 3.9–10.3)
lymph#: 1.6 10*3/uL (ref 0.9–3.3)

## 2010-11-04 LAB — COMPREHENSIVE METABOLIC PANEL
Albumin: 4 g/dL (ref 3.5–5.2)
Alkaline Phosphatase: 85 U/L (ref 39–117)
BUN: 21 mg/dL (ref 6–23)
CO2: 26 mEq/L (ref 19–32)
Calcium: 9.3 mg/dL (ref 8.4–10.5)
Chloride: 104 mEq/L (ref 96–112)
Glucose, Bld: 126 mg/dL — ABNORMAL HIGH (ref 70–99)
Potassium: 3.8 mEq/L (ref 3.5–5.3)
Sodium: 140 mEq/L (ref 135–145)
Total Protein: 6.4 g/dL (ref 6.0–8.3)

## 2010-11-07 NOTE — Op Note (Signed)
NAMEFLORNCE, RECORD            ACCOUNT NO.:  [id number]  MEDICAL RECORD NO.:  [id number]          PATIENT TYPE:  INP  LOCATION:  5120                         FACILITY:  [facility]  PHYSICIAN:  Wayland Denis, DO      DATE OF BIRTH:  [birth year]  DATE OF PROCEDURE:  10/12/2010 DATE OF DISCHARGE:                              OPERATIVE REPORT   PREOPERATIVE DIAGNOSIS:  Right breast cancer.  POSTOPERATIVE DIAGNOSIS:  Right breast cancer.  PROCEDURE:  Bilateral immediate breast reconstruction with expander and AlloDerm placement.  ATTENDING SURGEON:  Wayland Denis, DO  ANESTHESIA:  General.  INDICATIONS FOR PROCEDURE:  The patient is a 70 year old female who was diagnosed with breast cancer and decided on reconstruction with expander placement.  DESCRIPTION OF PROCEDURE:  The patient was seen in the preop holding area.  Risks and complications were explained.  She was taken to the OR, placed on the operating room table in the supine position.  General anesthesia was administered.  Once adequate time-out was called and all information was confirmed to be correct, the patient underwent bilateral mastectomy with right sentinel lymph node biopsy.  Once they were finished with their portion of the case, the patient was rendered to the Plastic and Reconstructive Surgery team, an additional time-out was called, all information was confirmed to be correct.  Fresh drapes were placed and we started on the left side first.  The pocket was irrigated with warm normal saline and hemostasis was achieved using electrocautery.  The pectoralis major muscle was lifted off the chest wall with the Bovie and hemostasis again was achieved with electrocautery.  The AlloDerm 8 x 16 was prepared according to the manufacture's guidelines and was placed in the pocket, The shiny side down.  The AlloDerm was tacked with simple interrupted and running 2-0 PDS to the inferior edge of the pectoralis major muscle  and then the chest wall at the inframammary fold.  A 19-Blake drain was placed and secured with a 3-0 silk suture.  The 600 mL expander was then prepared with the air evacuated and placed under the AlloDerm.  The AlloDerm was then tacked to the chest wall laterally.  The deep wound tissue was closed with 3-0 Vicryl, then 4-0 Vicryl running stitch and a 5-0 Monocryl.  A 200 mL of injectable normal saline was placed in the expander.  Dermabond was then applied.  Prior to closure, the excess skin was removed in order to better approximate the skin edges.  The 5-0 Monocryl was used to close the skin with a running subcuticular suture. The same procedure was done on the opposite side, no excess skin was removed.  The 8 x 16 AlloDerm was placed as it was on the left side and a 600 mL expander was used.  Allergan Natrelle and 200 mL of injectable normal saline was used to inflate the expander. The patient tolerated the procedure well.  There were no complications. The skin from the left side was sent to pathology.  She was awoken andtaken to recovery room in stable condition.     Wayland Denis, DO     CS/MEDQ  D:  10/13/2010  T:  10/13/2010  Job:  [id number]  Electronically Signed by Wayland Denis  on 11/07/2010 08:54:50 AM

## 2010-11-09 NOTE — Letter (Signed)
Summary: [facility]  [facility]   Imported By: Maryln Gottron 10/31/2010 15:05:29  _____________________________________________________________________  External Attachment:    Type:   Image     Comment:   External Document

## 2010-11-11 ENCOUNTER — Encounter: Payer: Medicare Other | Admitting: Genetic Counselor

## 2010-11-15 NOTE — Letter (Signed)
Summary: [facility]   Imported By: Maryln Gottron 11/09/2010 10:57:54  _____________________________________________________________________  External Attachment:    Type:   Image     Comment:   External Document

## 2010-11-17 ENCOUNTER — Encounter: Payer: Self-pay | Admitting: Internal Medicine

## 2010-11-17 ENCOUNTER — Ambulatory Visit (INDEPENDENT_AMBULATORY_CARE_PROVIDER_SITE_OTHER): Payer: Medicare Other | Admitting: Internal Medicine

## 2010-11-17 ENCOUNTER — Telehealth (INDEPENDENT_AMBULATORY_CARE_PROVIDER_SITE_OTHER): Payer: Self-pay | Admitting: *Deleted

## 2010-11-17 DIAGNOSIS — R5381 Other malaise: Secondary | ICD-10-CM

## 2010-11-17 DIAGNOSIS — D649 Anemia, unspecified: Secondary | ICD-10-CM | POA: Insufficient documentation

## 2010-11-17 DIAGNOSIS — R740 Nonspecific elevation of levels of transaminase and lactic acid dehydrogenase [LDH]: Secondary | ICD-10-CM

## 2010-11-17 DIAGNOSIS — R7402 Elevation of levels of lactic acid dehydrogenase (LDH): Secondary | ICD-10-CM | POA: Insufficient documentation

## 2010-11-17 DIAGNOSIS — R000 Tachycardia, unspecified: Secondary | ICD-10-CM

## 2010-11-17 DIAGNOSIS — R509 Fever, unspecified: Secondary | ICD-10-CM

## 2010-11-17 LAB — CONVERTED CEMR LAB
ALT: 47 units/L — ABNORMAL HIGH (ref 0–35)
AST: 45 units/L — ABNORMAL HIGH (ref 0–37)
Bilirubin Urine: NEGATIVE
Calcium: 11.3 mg/dL — ABNORMAL HIGH (ref 8.4–10.5)
Iron: 45 ug/dL (ref 42–145)
Ketones, urine, test strip: NEGATIVE
Magnesium: 2 mg/dL (ref 1.5–2.5)
Sodium: 142 meq/L (ref 135–145)
Specific Gravity, Urine: 1.025
TIBC: 279 ug/dL (ref 250–470)
Total CK: 41 units/L (ref 7–177)
UIBC: 234 ug/dL
Urobilinogen, UA: NEGATIVE

## 2010-11-18 ENCOUNTER — Encounter: Payer: Self-pay | Admitting: Internal Medicine

## 2010-11-22 ENCOUNTER — Other Ambulatory Visit: Payer: Self-pay | Admitting: Internal Medicine

## 2010-11-22 ENCOUNTER — Encounter (INDEPENDENT_AMBULATORY_CARE_PROVIDER_SITE_OTHER): Payer: Self-pay | Admitting: *Deleted

## 2010-11-22 ENCOUNTER — Telehealth (INDEPENDENT_AMBULATORY_CARE_PROVIDER_SITE_OTHER): Payer: Self-pay | Admitting: *Deleted

## 2010-11-22 ENCOUNTER — Other Ambulatory Visit (INDEPENDENT_AMBULATORY_CARE_PROVIDER_SITE_OTHER): Payer: Medicare Other

## 2010-11-22 DIAGNOSIS — R509 Fever, unspecified: Secondary | ICD-10-CM

## 2010-11-22 DIAGNOSIS — R740 Nonspecific elevation of levels of transaminase and lactic acid dehydrogenase [LDH]: Secondary | ICD-10-CM

## 2010-11-22 LAB — CBC WITH DIFFERENTIAL/PLATELET
Eosinophils Absolute: 0.2 10*3/uL (ref 0.0–0.7)
Lymphocytes Relative: 35.3 % (ref 12.0–46.0)
MCHC: 34.4 g/dL (ref 30.0–36.0)
MCV: 89.7 fl (ref 78.0–100.0)
Monocytes Absolute: 0.6 10*3/uL (ref 0.1–1.0)
Neutrophils Relative %: 49.1 % (ref 43.0–77.0)
Platelets: 234 10*3/uL (ref 150.0–400.0)
WBC: 5.3 10*3/uL (ref 4.5–10.5)

## 2010-11-24 NOTE — Assessment & Plan Note (Signed)
Summary: RX print-off   Laboratory Results   Urine Tests   Date/Time Reported: November 17, 2010 4:41 PM   Routine Urinalysis   Color: yellow Appearance: Clear Glucose: negative   (Normal Range: Negative) Bilirubin: negative   (Normal Range: Negative) Ketone: negative   (Normal Range: Negative) Spec. Gravity: 1.025   (Normal Range: 1.003-1.035) Blood: negative   (Normal Range: Negative) pH: 5.0   (Normal Range: 5.0-8.0) Protein: negative   (Normal Range: Negative) Urobilinogen: negative   (Normal Range: 0-1) Nitrite: negative   (Normal Range: Negative) Leukocyte Esterace: moderate   (Normal Range: Negative)    Comments: .sign

## 2010-11-24 NOTE — Assessment & Plan Note (Signed)
Summary: Rapid Heartbeat/CM   Vital Signs:  Patient profile:   70 year old female Weight:      179.4 pounds BMI:     30.91 Temp:     98.2 degrees F oral Resp:     15 per minute BP sitting:   140 / 82  (left arm) Cuff size:   regular  Vitals Entered By: Shonna Chock CMA (November 17, 2010 3:39 PM) CC: Rapid heartbeat and renew meds ( Diovan & Pantoprazole), Fatigue   Primary Care Provider:  [name] MD  CC:  Rapid heartbeat and renew meds ( Diovan & Pantoprazole) and Fatigue.  History of Present Illness:    [name] was referred by Dr Jamey Ripa for tachycardia ; it was 120 today in his office.She had noted a tachycardia for 3 days; she thought symptoms are related to [facility] , Chemotherapeutic oral agent. She is having "real hot  falshes". She has had some  nausea, diaphoresis, and light headedness, but denies resting  or  exertional chest  pain, vomiting, shortness of breath, dizziness, and syncope.  She   reports persistent fatigue  even  without physical activities over past week.  The patient also reports fever up o 100, night sweats, and weight loss.  The patient denies cough and hemoptysis.  The patient denies the following symptoms: leg swelling, orthopnea, PND, melena, adenopathy, severe snoring,  hair , nail and skin changes.  Last TSH was therapeutic 01/2010.Anemic post op; Hgb dropped from 13 pre op to 10 . She took iron X 30 days.  Current Medications (verified): 1)  Diovan Hct 320-12.5 Mg  Tabs (Valsartan-Hydrochlorothiazide) .... 1/2 By Mouth Qam 1/2 Pm 2)  Metformin Hcl 500 Mg  Tabs (Metformin Hcl) .Marland Kitchen.. 1 By Mouth Two Times A Day With Largest Meals  **labs Due Now** 3)  Multivitamin .... Qd 4)  Calcium With Vit D 600mg  .... Bid 5)  Fish Oil Concentrate .... 1000mg  Bid 6)  Clonidine Hcl 0.2 Mg  Tabs (Clonidine Hcl) .Marland Kitchen.. 1 Bid 7)  Oxytrol 3.9 Mg/24hr Pttw (Oxybutynin) .Marland Kitchen.. 1 On Mondays-Pm, 1 On Friday-Am 8)  Vitamin D3 1000 Unit Caps (Cholecalciferol) .Marland Kitchen.. 1 By Mouth Two  Times A Day 9)  Sulfamethoxazole-Trimethoprim 400-80 Mg Tabs (Sulfamethoxazole-Trimethoprim) .... As A Prevenative 10)  Pantoprazole Sodium 40 Mg Tbec (Pantoprazole Sodium) .Marland Kitchen.. 1 By Mouth Once Daily 30 Mins Before A Meal 11)  Align  Caps (Probiotic Product) .... Take 1 Capsule By Mouth Once A Day  Allergies: 1)  ! Ace Inhibitors 2)  ! Codeine  Review of Systems ENT:  Denies sore throat; No facial pain , frontal headache or purulence. Resp:  Denies sputum productive. GI:  Denies constipation and diarrhea. GU:  Denies discharge, dysuria, and hematuria. Derm:  New lesion on back X 4 days. Endo:  Complains of heat intolerance; denies cold intolerance.  Physical Exam  General:  well-nourished,in no acute distress; alert,appropriate and cooperative throughout examination Eyes:  No corneal or conjunctival inflammation noted. EOMI. Perrla.No lid lag Nose:  External nasal examination shows no deformity or inflammation. Nasal mucosa are pink and moist without lesions or exudates. Mouth:  Oral mucosa and oropharynx without lesions or exudates.  Teeth in good repair. Neck:  No deformities, masses, or tenderness noted. Lungs:  Normal respiratory effort, chest expands symmetrically. Lungs are clear to auscultation, no crackles or wheezes. Heart:  regular rhythm, no murmur, no gallop, no rub, no JVD, no HJR, and tachycardia.   Abdomen:  Bowel sounds positive,abdomen soft  and non-tender without masses, organomegaly or hernias noted. Pulses:  R and L carotid,radial,dorsalis pedis and posterior tibial pulses are full and equal bilaterally Extremities:  No clubbing, cyanosis, edema, or deformity noted . No oncholysis. Neg Homan's Neurologic:  alert & oriented X3 and DTRs symmetrical and normal.  No tremor Skin:  Intact without suspicious lesions or rashes. Tiny excoriation upper back Cervical Nodes:  No lymphadenopathy noted Axillary Nodes:  No palpable lymphadenopathy but tende post op Psych:  memory  intact for recent and remote, normally interactive, and good eye contact.     Impression & Recommendations:  Problem # 1:  TACHYCARDIA (ICD-785.0)  Orders: EKG w/ Interpretation (93000) Venipuncture (04540) TLB-BMP (Basic Metabolic Panel-BMET) (80048-METABOL) TLB-TSH (Thyroid Stimulating Hormone) (84443-TSH) TLB-Cardiac Panel (98119_14782-NFAO) TLB-Magnesium (Mg) (83735-MG) TLB-T4 (Thyrox), Free [phone])  Problem # 2:  FEVER (ICD-780.60)  Orders: Venipuncture (69629) TLB-CBC Platelet - w/Differential (85025-CBCD) T-Culture, Urine (52841-32440)  Problem # 3:  FATIGUE (ICD-780.79)  Orders: Venipuncture (10272)  Problem # 4:  ANEMIA (ICD-285.9)  Orders: Venipuncture (53664) TLB-IBC Pnl (Iron/FE;Transferrin) (83550-IBC) TLB-CBC Platelet - w/Differential (85025-CBCD)  Problem # 5:  NONSPEC ELEVATION OF LEVELS OF TRANSAMINASE/LDH (ICD-790.4)  Orders: Venipuncture (40347) TLB-ALT (SGPT) (84460-ALT) TLB-AST (SGOT) (84450-SGOT)  Complete Medication List: 1)  Diovan Hct 320-12.5 Mg Tabs (Valsartan-hydrochlorothiazide) .... 1/2 by mouth qam 1/2 pm 2)  Metformin Hcl 500 Mg Tabs (Metformin hcl) .Marland Kitchen.. 1 by mouth two times a day with largest meals  **labs due now** 3)  Multivitamin  .... Qd 4)  Calcium With Vit D 600mg   .... Bid 5)  Fish Oil Concentrate  .... 1000mg  bid 6)  Clonidine Hcl 0.2 Mg Tabs (Clonidine hcl) .Marland Kitchen.. 1 bid 7)  Oxytrol 3.9 Mg/24hr Pttw (Oxybutynin) .Marland Kitchen.. 1 on mondays-pm, 1 on friday-am 8)  Vitamin D3 1000 Unit Caps (Cholecalciferol) .Marland Kitchen.. 1 by mouth two times a day 9)  Sulfamethoxazole-trimethoprim 400-80 Mg Tabs (Sulfamethoxazole-trimethoprim) .... As a prevenative 10)  Pantoprazole Sodium 40 Mg Tbec (Pantoprazole sodium) .Marland Kitchen.. 1 by mouth once daily 30 mins before a meal 11)  Align Caps (Probiotic product) .... Take 1 capsule by mouth once a day  Patient Instructions: 1)  Take 650-1000mg  of Tylenol every 4-6 hours as needed for relief of pain or comfort of  fever AVOID taking more than 4000mg   in a 24 hour period (can cause liver damage in higher doses)   Orders Added: 1)  Est. Patient Level IV [42595] 2)  EKG w/ Interpretation [93000] 3)  Venipuncture [36415] 4)  TLB-IBC Pnl (Iron/FE;Transferrin) [83550-IBC] 5)  TLB-CBC Platelet - w/Differential [85025-CBCD] 6)  TLB-BMP (Basic Metabolic Panel-BMET) [80048-METABOL] 7)  TLB-TSH (Thyroid Stimulating Hormone) [84443-TSH] 8)  TLB-ALT (SGPT) [84460-ALT] 9)  TLB-AST (SGOT) [84450-SGOT] 10)  TLB-Cardiac Panel [82550_82553-CARD] 11)  TLB-Magnesium (Mg) [83735-MG] 12)  T-Culture, Urine [63875-64332] 13)  TLB-T4 (Thyrox), Free [95188-CZ6S]  Appended Document: Rapid Heartbeat/CM

## 2010-11-24 NOTE — Progress Notes (Signed)
Summary: Appointment Due  Phone Note Outgoing Call Call back at [facility] Phone [phone] Call back at Work Phone [phone]   Call placed by: Shonna Chock CMA,  November 17, 2010 2:56 PM Call placed to: Patient Summary of Call: Left message on machine(Home Number/ Work Number)  for patient to return call when avaliable, Reason for call:  Per Dr.Hopper's and Dr.Streck's conversation patient needs to be seen today. I was going to have patient cone into the office now if she is avaliable.  Shonna Chock CMA  November 17, 2010 2:59 PM   Follow-up for Phone Call        Patient called-on the way  Follow-up by: Shonna Chock CMA,  November 17, 2010 3:06 PM

## 2010-11-29 NOTE — Progress Notes (Signed)
Summary: Refill request  Phone Note Refill Request   Refills Requested: Medication #1:  METFORMIN HCL 500 MG  TABS 1 by mouth two times a day with largest meals  **LABS DUE NOW**    New/Updated Medications: METFORMIN HCL 500 MG  TABS (METFORMIN HCL) 1 by mouth two times a day with largest meals Prescriptions: METFORMIN HCL 500 MG  TABS (METFORMIN HCL) 1 by mouth two times a day with largest meals  #180 x 1   Entered by:   Shonna Chock CMA   Authorized by:   [name] MD   Signed by:   Shonna Chock CMA on 11/22/2010   Method used:   Reprint   RxID:   [id number] METFORMIN HCL 500 MG  TABS (METFORMIN HCL) 1 by mouth two times a day with largest meals  **LABS DUE NOW**  #180 x 1   Entered by:   Shonna Chock CMA   Authorized by:   [name] MD   Signed by:   Shonna Chock CMA on 11/22/2010   Method used:   Print then Give to Patient   RxID:   [id number]

## 2010-12-01 LAB — GLUCOSE, CAPILLARY: Glucose-Capillary: 93 mg/dL (ref 70–99)

## 2010-12-15 NOTE — Letter (Signed)
Summary: [facility]  [facility]   Imported By: Sherian Rein 12/05/2010 13:25:54  _____________________________________________________________________  External Attachment:    Type:   Image     Comment:   External Document

## 2011-01-03 ENCOUNTER — Encounter (HOSPITAL_BASED_OUTPATIENT_CLINIC_OR_DEPARTMENT_OTHER): Payer: Medicare Other | Admitting: Oncology

## 2011-01-03 ENCOUNTER — Other Ambulatory Visit: Payer: Self-pay | Admitting: Oncology

## 2011-01-03 DIAGNOSIS — C50419 Malignant neoplasm of upper-outer quadrant of unspecified female breast: Secondary | ICD-10-CM

## 2011-01-03 LAB — CBC WITH DIFFERENTIAL/PLATELET
Basophils Absolute: 0 10*3/uL (ref 0.0–0.1)
Eosinophils Absolute: 0.1 10*3/uL (ref 0.0–0.5)
HCT: 34.4 % — ABNORMAL LOW (ref 34.8–46.6)
LYMPH%: 30.1 % (ref 14.0–49.7)
MCV: 85.7 fL (ref 79.5–101.0)
MONO#: 0.4 10*3/uL (ref 0.1–0.9)
NEUT#: 3.2 10*3/uL (ref 1.5–6.5)
NEUT%: 60.7 % (ref 38.4–76.8)
Platelets: 208 10*3/uL (ref 145–400)
WBC: 5.3 10*3/uL (ref 3.9–10.3)

## 2011-01-04 LAB — COMPREHENSIVE METABOLIC PANEL
BUN: 21 mg/dL (ref 6–23)
CO2: 25 mEq/L (ref 19–32)
Creatinine, Ser: 1.13 mg/dL (ref 0.40–1.20)
Glucose, Bld: 182 mg/dL — ABNORMAL HIGH (ref 70–99)
Total Bilirubin: 0.7 mg/dL (ref 0.3–1.2)
Total Protein: 6.6 g/dL (ref 6.0–8.3)

## 2011-01-04 LAB — VITAMIN D 25 HYDROXY (VIT D DEFICIENCY, FRACTURES): Vit D, 25-Hydroxy: 61 ng/mL (ref 30–89)

## 2011-01-31 ENCOUNTER — Encounter (INDEPENDENT_AMBULATORY_CARE_PROVIDER_SITE_OTHER): Payer: Self-pay | Admitting: Surgery

## 2011-01-31 NOTE — Assessment & Plan Note (Signed)
[facility]                            CARDIOLOGY OFFICE NOTE   [name], [name]                   MRN:          [id number]  DATE:08/17/2008                            DOB:          [birth year]    REFERRING PHYSICIAN:  Titus Dubin. Alwyn Ren, MD, FACP, FCCP   IDENTIFICATION:  The patient is a 70 year old who is referred for  evaluation of shortness of breath.   HISTORY OF PRESENT ILLNESS:  The patient has no known history of  coronary artery disease.  She is a very active person.  By her report,  the family goes kayaking, hiking.  She notes that in August they were on  vacation, she was carrying a kayak out of the water to the camp site  when she just gave out.  Since that time, she has noted progressive  shortness of breath.  If she goes to see her sister and has to walk up a  couple of flights, she feels like she has to pull herself up the second  flight.  She has stopped at times to check her breath.  Eight months  ago, she did not have to do this.  She denies any PND.  No chest pain.  No palpitations.  No dizziness.  No syncope.   ALLERGIES:  CODEINE.   CURRENT MEDICATIONS:  1. Diovan hydrochlorothiazide 320/12.5 one half tablet q.a.m.  2. Clonidine 0.2 b.i.d.  3. Oxytrol patch.  4. Metformin 500 mg b.i.d.  5. Multivitamin.  6. Pravastatin 20 mg.  7. Calcium with D b.i.d.  8. Fish oil b.i.d.   PAST MEDICAL HISTORY:  1. Diabetes, on oral therapy for 8 to 9 months.  2. Hypertension, treated x2 years.  3. Dyslipidemia, on meds for 6 months.   SOCIAL HISTORY:  The patient is married, has a 10-pack-year history of  smoking, quit in 1979.  Drinks 1 to 2 beers per month.   FAMILY HISTORY:  Mother died at the age of 79 of dehydration.  Father  died at age of 75 of a heart attack.  One sister alive, had breast  cancer.  One brother deceased at 69 in a car accident.   REVIEW OF SYSTEMS:  Notes reflux  Otherwise all systems reviewed,  negative to the above problem except as noted above.   PHYSICAL EXAMINATION:  GENERAL:  The patient is in no distress at rest.  VITAL SIGNS:  Blood pressure is 134/96, pulse is 84, and weight is 186.  HEENT:  Normocephalic and atraumatic.  EOMI, PERL.  Mucous membranes are  moist.  NECK:  JVP is normal.  No thyromegaly or bruits.  LUNGS:  Clear to auscultation without rales or wheezes.  CARDIAC:  Regular rate and rhythm.  S1 and S2.  No S3, S4, or murmurs  noted.  PMI not displaced.  ABDOMEN:  Supple, nontender.  Normal bowel sounds.  No masses.  No  hepatomegaly.  EXTREMITIES:  Good distal pulses throughout.  No lower extremity edema.   A 12-lead EKG done at Dr. Frederik Pear office shows normal sinus rhythm at  90 beats per minute.  Nonspecific  ST-T wave changes.   IMPRESSION:  1. [name] is a 70 year old woman with dyspnea that is relatively      new, bit concerning that she is very active.  On examination today,      relatively unremarkable.  EKG is nondiagnostic.  What I would      recommend is that we check a thyroid.  I also set her up for an      echocardiogram to evaluate her systolic and diastolic function.  In      addition, I will schedule her for a stress echocardiogram to      evaluate for inducible wall motion changes.  I would continue      activities as tolerated for now and I will be in touch with her      once I have seen the results.  2. Hypertension, diastolic is a little high, again this will need to      be followed.  I am not convinced though that this explains her      symptoms.  Continue on current dosing.  3. Dyslipidemia.  Pravastatin will need to be followed.  4. History of diabetes, on oral agents, will need to be followed.   I will be in touch with the patient again regarding test results and  followup based on that.     Pricilla Riffle, MD, [facility]  Electronically Signed    PVR/MedQ  DD: 08/17/2008  DT: 08/18/2008  Job #: [id number]   cc:   Titus Dubin. Alwyn Ren, MD,FACP,FCCP

## 2011-04-04 ENCOUNTER — Encounter (HOSPITAL_BASED_OUTPATIENT_CLINIC_OR_DEPARTMENT_OTHER): Payer: Medicare Other | Admitting: Oncology

## 2011-04-04 ENCOUNTER — Other Ambulatory Visit: Payer: Self-pay | Admitting: Oncology

## 2011-04-04 DIAGNOSIS — C50419 Malignant neoplasm of upper-outer quadrant of unspecified female breast: Secondary | ICD-10-CM

## 2011-04-04 LAB — COMPREHENSIVE METABOLIC PANEL
AST: 54 U/L — ABNORMAL HIGH (ref 0–37)
Albumin: 4.1 g/dL (ref 3.5–5.2)
Alkaline Phosphatase: 70 U/L (ref 39–117)
BUN: 18 mg/dL (ref 6–23)
Creatinine, Ser: 1.08 mg/dL (ref 0.50–1.10)
Glucose, Bld: 154 mg/dL — ABNORMAL HIGH (ref 70–99)
Potassium: 3.6 mEq/L (ref 3.5–5.3)
Total Bilirubin: 0.8 mg/dL (ref 0.3–1.2)

## 2011-04-04 LAB — CBC WITH DIFFERENTIAL/PLATELET
EOS%: 2.2 % (ref 0.0–7.0)
Eosinophils Absolute: 0.1 10*3/uL (ref 0.0–0.5)
HGB: 12.2 g/dL (ref 11.6–15.9)
MCH: 29.3 pg (ref 25.1–34.0)
MCV: 84.5 fL (ref 79.5–101.0)
MONO%: 9.4 % (ref 0.0–14.0)
NEUT#: 3.5 10*3/uL (ref 1.5–6.5)
RBC: 4.18 10*6/uL (ref 3.70–5.45)
RDW: 15.1 % — ABNORMAL HIGH (ref 11.2–14.5)
lymph#: 1.6 10*3/uL (ref 0.9–3.3)

## 2011-04-19 HISTORY — PX: PLACEMENT OF BREAST IMPLANTS: SHX6334

## 2011-05-16 ENCOUNTER — Encounter (HOSPITAL_BASED_OUTPATIENT_CLINIC_OR_DEPARTMENT_OTHER)
Admission: RE | Admit: 2011-05-16 | Discharge: 2011-05-16 | Disposition: A | Discharge status: Home / Self Care | Payer: Medicare Other | Source: Ambulatory Visit | Attending: Plastic Surgery | Admitting: Plastic Surgery

## 2011-05-16 LAB — BASIC METABOLIC PANEL
BUN: 14 mg/dL (ref 6–23)
CO2: 32 mEq/L (ref 19–32)
Chloride: 103 mEq/L (ref 96–112)
Creatinine, Ser: 0.85 mg/dL (ref 0.50–1.10)
GFR calc Af Amer: >60 mL/min (ref >60)
Glucose, Bld: 113 mg/dL — ABNORMAL HIGH (ref 70–99)
Potassium: 4 mEq/L (ref 3.5–5.1)

## 2011-05-18 ENCOUNTER — Ambulatory Visit (HOSPITAL_BASED_OUTPATIENT_CLINIC_OR_DEPARTMENT_OTHER)
Admission: RE | Admit: 2011-05-18 | Discharge: 2011-05-18 | Disposition: A | Discharge status: Home / Self Care | Payer: Medicare Other | Source: Ambulatory Visit | Attending: Plastic Surgery | Admitting: Plastic Surgery

## 2011-05-18 ENCOUNTER — Other Ambulatory Visit: Payer: Self-pay | Admitting: Plastic Surgery

## 2011-05-18 DIAGNOSIS — Z9010 Acquired absence of unspecified breast and nipple: Secondary | ICD-10-CM | POA: Insufficient documentation

## 2011-05-18 DIAGNOSIS — I10 Essential (primary) hypertension: Secondary | ICD-10-CM | POA: Insufficient documentation

## 2011-05-18 DIAGNOSIS — E119 Type 2 diabetes mellitus without complications: Secondary | ICD-10-CM | POA: Insufficient documentation

## 2011-05-18 DIAGNOSIS — K219 Gastro-esophageal reflux disease without esophagitis: Secondary | ICD-10-CM | POA: Insufficient documentation

## 2011-05-18 DIAGNOSIS — Z01812 Encounter for preprocedural laboratory examination: Secondary | ICD-10-CM | POA: Insufficient documentation

## 2011-05-18 DIAGNOSIS — C50919 Malignant neoplasm of unspecified site of unspecified female breast: Secondary | ICD-10-CM | POA: Insufficient documentation

## 2011-05-18 DIAGNOSIS — Z421 Encounter for breast reconstruction following mastectomy: Secondary | ICD-10-CM | POA: Insufficient documentation

## 2011-05-18 LAB — GLUCOSE, CAPILLARY: Glucose-Capillary: 149 mg/dL — ABNORMAL HIGH (ref 70–99)

## 2011-05-22 ENCOUNTER — Other Ambulatory Visit: Payer: Self-pay | Admitting: Internal Medicine

## 2011-05-24 NOTE — Op Note (Signed)
[name], [name]            ACCOUNT NO.:  [id number]  MEDICAL RECORD NO.:  [id number]  LOCATION:                                 FACILITY:  PHYSICIAN:  Wayland Denis, DO      DATE OF BIRTH:  09/04/1941  DATE OF PROCEDURE:  05/18/2011 DATE OF DISCHARGE:                              OPERATIVE REPORT   PREOPERATIVE DIAGNOSIS:  Breast cancer.  POSTOPERATIVE DIAGNOSIS:  Breast cancer.  PROCEDURE:  Implant exchange bilateral breasts with Allergan Natrelle 450 mL placed in the 450 mL medium profile implant. Bilateral capsulectomy.  ATTENDING PHYSICIAN:  Wayland Denis, DO  ANESTHESIA:  General.  INDICATION FOR PROCEDURE:  The patient is a 70 year old female who was diagnosed with breast cancer and underwent bilateral mastectomies with immediate reconstruction using AlloDerm and expander placement.  She now presents for exchange.  DESCRIPTION OF PROCEDURE:  The patient was taken to the operating room after consent was confirmed.  She was placed on the operating room table in the supine position.  General anesthesia was administered.  Once adequate a time-out was called, all information was confirmed to be correct.  She was prepped with ChloraPrep and draped in the usual sterile fashion.  We started on the left side.  A #15 blade was used to make an incision along the previous mastectomy site scar.  A small portion of the skin was excised for better scarring and healing and that was sent to pathology. The Bovie was used to dissect down to the implant in an inferior fashion in order to preserve a layer over the implant. This way the muscle would come  slightly more inferior than the skin incision so there would be a barrier.  The expander was then removed.  The pocket was irrigated with normal saline and antibiotic solution.  The inframammary fold capsule and portion of the chest wall was removed as well as laterally in order to bring the pocket more medial and superior and  raising the inframammary fold.  Hemostasis was achieved using electrocautery.  Once that was freed, the inframammary fold was sutured more superiorly and the lateral portion more medially.  A 2-0 PDS was used to do this, running and interrupted sutures were used.  The pocket was irrigated with antibiotic solution again.  The sizer was placed and 450 mL of injectable saline was used and it had a good contour and size. Therefore, the Allergan Natrelle 420-450 mL expander was chosen.  It was prepared according to the manufacturer guidelines.  It was evacuated of air and placed in the pocket.  A 450 mL of normal saline was injected. She was freed medially and superiorly as well prior to placement implant.  The deep layers were closed with 3-0 Vicryl, then a 4-0 Vicryl and a running subcuticular 5-0 Monocryl was used to close the skin. Attention was then turned to the right side.  The same procedure was done except for the skin was not excised and it was irrigated with normal saline.  An Allergan Natrelle 420-450 mL expander medium height was chosen and the air was evacuated.  It was placed in the pocket and expanded to 450 mL of injectable normal saline.  It was closed in the same fashion with 3-0 Vicryl, 4-0 Vicryl, and 5-0 Monocryl.  Dermabond was applied, ABD, and a breast binder.  She tolerated the procedure well.  There were no complications.  She was awakened and taken to recovery room in stable condition.  The capsule was sent to pathology from both sides.     Wayland Denis, DO     CS/MEDQ  D:  05/18/2011  T:  05/18/2011  Job:  [id number]  Electronically Signed by Wayland Denis  on 05/24/2011 11:09:20 AM

## 2011-06-27 ENCOUNTER — Ambulatory Visit (INDEPENDENT_AMBULATORY_CARE_PROVIDER_SITE_OTHER): Payer: Medicare Other | Admitting: Internal Medicine

## 2011-06-27 ENCOUNTER — Encounter: Payer: Self-pay | Admitting: Internal Medicine

## 2011-06-27 DIAGNOSIS — I10 Essential (primary) hypertension: Secondary | ICD-10-CM

## 2011-06-27 DIAGNOSIS — E119 Type 2 diabetes mellitus without complications: Secondary | ICD-10-CM

## 2011-06-27 DIAGNOSIS — H6120 Impacted cerumen, unspecified ear: Secondary | ICD-10-CM

## 2011-06-27 LAB — MICROALBUMIN / CREATININE URINE RATIO
Creatinine,U: 56 mg/dL
Microalb, Ur: 0.2 mg/dL (ref 0.0–1.9)

## 2011-06-27 LAB — HEMOGLOBIN A1C: Hgb A1c MFr Bld: 6.6 % — ABNORMAL HIGH (ref 4.6–6.5)

## 2011-06-27 NOTE — Progress Notes (Signed)
Addended byMarga Melnick F on: 06/27/2011 11:59 AM   Modules accepted: Orders

## 2011-06-27 NOTE — Progress Notes (Signed)
  Subjective:    Patient ID: [name], female    DOB: 04-05-1941, 70 y.o.   MRN: [id number]  HPI HYPERTENSION: gradual increase over 2 mos Disease Monitoring  Blood pressure range:low 132/69, up  To 154/84  Chest pain: no   Dyspnea: no   Claudication: no   Medication compliance: yes  Medication Side Effects  Lightheadedness: no   Urinary frequency: no, but urge incontinence treated by Urology   Edema: no      Preventitive Healthcare:  Exercise: yes, walking since surgery, 30 min   Diet Pattern: no plan  Salt Restriction: yes      Review of Systems decreased hearing     Objective:   Physical Exam General appearance is one of good health and nourishment w/o distress.  Eyes: No conjunctival inflammation or scleral icterus is present. Ears: increased cerumen R > L  Heart:  Normal rate and regular rhythm. S1 and S2 normal without gallop, murmur, click, rub .S 4 with slight slurring  Lungs:Chest clear to auscultation; no wheezes, rhonchi,rales ,or rubs present.No increased work of breathing.   Abdomen: bowel sounds normal, soft and non-tender without masses, organomegaly or hernias noted.  No guarding or rebound . No AAA; no bruits  Skin:Warm & dry.  Intact without suspicious lesions or rashes ; no jaundice or tenting  Vasc: no bruits or pulse deficits             Assessment & Plan:  #1 hypertension, recent exacerbation. Blood pressure excellent today.  #2 cerumen impactions  Plan see orders and recommendations.

## 2011-06-27 NOTE — Patient Instructions (Addendum)
Avoid ingestion of  excess salt/sodium.Cook with pepper & other spices . Use the salt substitute "No Salt"(unless your potassium has been elevated) OR the Mrs Sharilyn Sites products to season food @ the table. Avoid foods which taste salty or "vinegary" as their sodium contentet will be high.  Blood Pressure Goal  Ideally is an AVERAGE < 135/85. This AVERAGE should be calculated from @ least 5-7 BP readings taken @ different times of day on different days of week. You should not respond to isolated BP readings , but rather the AVERAGE for that week  Bring cuff to all MD appts.  Please do not use Q-tips as we discussed. Should wax build up occur, please put 2-3 drops of mineral oil in the ear at night and cover the canal with a  cotton ball. In the morning fill the canal with hydrogen peroxide & leave  for 10-15 minutes. Following this shower and use the thinnest washrag available to wick out the wax.

## 2011-06-28 ENCOUNTER — Encounter: Payer: Self-pay | Admitting: Internal Medicine

## 2011-07-24 ENCOUNTER — Ambulatory Visit (INDEPENDENT_AMBULATORY_CARE_PROVIDER_SITE_OTHER): Payer: Medicare Other

## 2011-07-24 DIAGNOSIS — Z23 Encounter for immunization: Secondary | ICD-10-CM

## 2011-08-03 ENCOUNTER — Other Ambulatory Visit: Payer: Self-pay | Admitting: Internal Medicine

## 2011-08-23 ENCOUNTER — Encounter (INDEPENDENT_AMBULATORY_CARE_PROVIDER_SITE_OTHER): Payer: Self-pay | Admitting: General Surgery

## 2011-08-25 ENCOUNTER — Encounter (INDEPENDENT_AMBULATORY_CARE_PROVIDER_SITE_OTHER): Payer: Self-pay | Admitting: Surgery

## 2011-08-25 ENCOUNTER — Ambulatory Visit (INDEPENDENT_AMBULATORY_CARE_PROVIDER_SITE_OTHER): Payer: Medicare Other | Admitting: Surgery

## 2011-08-25 VITALS — BP 138/84 | HR 60 | Temp 97.2°F | Resp 18 | Ht 64.0 in | Wt 181.2 lb

## 2011-08-25 DIAGNOSIS — Z853 Personal history of malignant neoplasm of breast: Secondary | ICD-10-CM

## 2011-08-25 NOTE — Progress Notes (Signed)
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 She has no problems or concerns on either side.  PFSH: She has had no significant changes since the last visit here.  ROS: There have been no significant changes since the last visit here  EXAM: General: The patient is alert, oriented, generally healty appearing, NAD. Mood and affect are normal.  Breasts:  S/P bilateral mastectomy and immediate implant reconstruction. No evidence of abnormality and good cosmetic result  Lymphatics: She has no axillary or supraclavicular adenopathy on either side.  Extremities: Full ROM of the surgical side with no lymphedema noted.  Data Reviewed: Oncology notes reviewed  Impression: Doing well, with no evidence of recurrent cancer or new cancer  Plan: RTC PRN

## 2011-08-25 NOTE — Patient Instructions (Signed)
Check once a month for any nodules or changes around your mastectomy sites. See me if you find anything, otherwise you should just follow up with your primary care and oncologist

## 2011-08-29 ENCOUNTER — Ambulatory Visit (INDEPENDENT_AMBULATORY_CARE_PROVIDER_SITE_OTHER): Payer: Medicare Other | Admitting: Internal Medicine

## 2011-08-29 ENCOUNTER — Encounter: Payer: Self-pay | Admitting: Internal Medicine

## 2011-08-29 DIAGNOSIS — Z0000 Encounter for general adult medical examination without abnormal findings: Secondary | ICD-10-CM

## 2011-08-29 DIAGNOSIS — E785 Hyperlipidemia, unspecified: Secondary | ICD-10-CM

## 2011-08-29 DIAGNOSIS — E119 Type 2 diabetes mellitus without complications: Secondary | ICD-10-CM

## 2011-08-29 DIAGNOSIS — K219 Gastro-esophageal reflux disease without esophagitis: Secondary | ICD-10-CM

## 2011-08-29 DIAGNOSIS — I10 Essential (primary) hypertension: Secondary | ICD-10-CM

## 2011-08-29 LAB — CBC WITH DIFFERENTIAL/PLATELET
Basophils Absolute: 0 10*3/uL (ref 0.0–0.1)
Basophils Relative: 0.4 % (ref 0.0–3.0)
Eosinophils Absolute: 0.1 10*3/uL (ref 0.0–0.7)
Eosinophils Relative: 2.2 % (ref 0.0–5.0)
HCT: 35.4 % — ABNORMAL LOW (ref 36.0–46.0)
Hemoglobin: 12 g/dL (ref 12.0–15.0)
Lymphocytes Relative: 29.8 % (ref 12.0–46.0)
Lymphs Abs: 1.7 10*3/uL (ref 0.7–4.0)
MCHC: 34 g/dL (ref 30.0–36.0)
MCV: 87.1 fl (ref 78.0–100.0)
Monocytes Absolute: 0.6 10*3/uL (ref 0.1–1.0)
Monocytes Relative: 10.4 % (ref 3.0–12.0)
Neutro Abs: 3.2 10*3/uL (ref 1.4–7.7)
Neutrophils Relative %: 57.2 % (ref 43.0–77.0)
Platelets: 234 10*3/uL (ref 150.0–400.0)
RBC: 4.07 Mil/uL (ref 3.87–5.11)
RDW: 14.7 % — ABNORMAL HIGH (ref 11.5–14.6)
WBC: 5.5 10*3/uL (ref 4.5–10.5)

## 2011-08-29 LAB — MICROALBUMIN / CREATININE URINE RATIO: Microalb Creat Ratio: 0.3 mg/g (ref 0.0–30.0)

## 2011-08-29 LAB — HEMOGLOBIN A1C: Hgb A1c MFr Bld: 6.9 % — ABNORMAL HIGH (ref 4.6–6.5)

## 2011-08-29 NOTE — Patient Instructions (Signed)
Eat a low-fat diet with lots of fruits and vegetables, up to 7-9 servings per day. Avoid obesity; your goal is waist measurement < 40 inches.Consume less than 40 grams of sugar per day from foods & drinks with High Fructose Corn Sugar as #1,2,3 or # 4 on label. Follow the low carb nutrition program in [employer] as closely as possible to prevent Diabetes progression & complications. White carbohydrates (potatoes, rice, bread, and pasta) have a high spike of sugar and a high load of sugar. For example a  baked potato has a cup of sugar and a  french fry  2 teaspoons of sugar. Yams, wild  rice, whole grained bread &  wheat pasta have been much lower spike and load of  sugar. Portions should be the size of a deck of cards or your palm. Exercise  30-45  minutes a day, 3-4 days a week. Walking is especially valuable in preventing Osteoporosis. Diabetes Monitor:    A1c  Goals:  Non diabetic adults: < 6.1%  Excellent diabetic control: 6.2-6.4 % ( medical opinion varies as to whether  6.2- 6.4 % is Diabetes or "pre Diabetes")  Fair diabetic control: 6.5-7%  Poor diabetic control: greater than 7 % ( except with additional factors such as  advanced age; significant coronary or neurologic disease,etc).  Goals for home glucose monitoring are : fasting  or morning glucose goal of  90-150. Two hours after any meal , goal = < 180, preferably < 150.

## 2011-08-29 NOTE — Progress Notes (Signed)
Subjective:    Patient ID: [name], female    DOB: December 04, 1940, 70 y.o.   MRN: [id number]  HPI Medicare Wellness Visit:  The following psychosocial & medical history were reviewed as required by Medicare.   Social history: caffeine: none , alcohol:  no ,  tobacco use : quit 1979  & exercise : walking 3X/ week 30-60 min.   Home & personal  safety / fall risk: no issues, activities of daily living: no limitations  , seatbelt use : yes , and smoke alarm employment : yes .  Power of Attorney/Living Will status : inplace  Vision ( as recorded per Nurse) & Hearing  evaluation :  Ophth exam due 2/13; early cataract. Wall chart read & whisper heard @ 6 ft Orientation: oriented X 3 , memory & recall :good, spelling  testing: good,and mood & affect : normal . Depression / anxiety: denied Travel history :  2005 [place] , immunization status :Shingles needed , transfusion history:  1976 post TAH, and preventive health surveillance ( colonoscopies, BMD , etc as per protocol/ [facility]): colonoscopy 10/11, Dental care:  Seen every 6 mos . Chart reviewed &  Updated. Active issues reviewed & addressed.       Review of Systems HYPERTENSION: Disease Monitoring: Blood pressure range-130/75 or <  Chest pain, palpitations- no       Dyspnea- no Medication Compliance- no  Lightheadedness,Syncope- no    Edema- no  DIABETES: Disease Monitoring: Blood Sugar : FBS < 105; no 2 hr post meal monitor  Polyuria/phagia/dipsia- no       Visual problems- no Medications: Compliance- yes  Hypoglycemic symptoms- no  HYPERLIPIDEMIA: Disease Monitoring: See symptoms for Hypertension Medications: Compliance- no statin    ROS:Abd pain, bowel changes- loose stool with IBS intermittently   Muscle aches- no           Objective:   Physical Exam Gen.: Healthy and well-nourished in appearance. Alert, appropriate and cooperative throughout exam. Head: Normocephalic without obvious abnormalities Eyes: No  corneal or conjunctival inflammation noted. Pupils equal round reactive to light and accommodation.  Extraocular motion intact.  Ears: External  ear exam reveals no significant lesions or deformities. Canals clear .TMs normal.  Nose: External nasal exam reveals no deformity or inflammation. Nasal mucosa are pink and moist. No lesions or exudates noted.  Mouth: Oral mucosa and oropharynx reveal no lesions or exudates. Teeth in good repair. Neck: No deformities, masses, or tenderness noted. Range of motion & Thyroid  normal. Lungs: Normal respiratory effort; chest expands symmetrically. Lungs are clear to auscultation without rales, wheezes, or increased work of breathing. Heart: Normal rate and rhythm. Normal S1 and S2. No gallop, click, or rub. S 4 with slight slurring; no  murmur. Abdomen: Bowel sounds normal; abdomen soft and nontender. No masses, organomegaly or hernias noted. Genitalia: Gyn retired   .                                                                                   Musculoskeletal/extremities: Minimal lordosis  noted of  the thoracic  spine. No clubbing, cyanosis, edema, or deformity noted. Range of motion  normal .Tone & strength  normal.Joints normal. Nail health  good. Vascular: Carotid, radial artery, dorsalis pedis and  posterior tibial pulses are full and equal. No bruits present. Neurologic: Alert and oriented x3. Deep tendon reflexes symmetrical and normal.          Skin: Intact without suspicious lesions or rashes. Lymph: No cervical, axillary lymphadenopathy present. Psych: Mood and affect are normal. Normally interactive                                                                                         Assessment & Plan:  #1 Medicare Wellness Exam; criteria met ; data entered #2 Problem List reviewed ; Assessment/ Recommendations made.  Diabetes was adequately controlled in October her A1c was 6.6. With diabetes; her LDL goal is less than 100, ideally  less than 70. Plan: see Orders . Fasting labs will be checked; she will share these with all physicians seen. She has  an appointment with her oncologist in January.

## 2011-08-30 LAB — LIPID PANEL
HDL: 35.7 mg/dL — ABNORMAL LOW (ref >39.00)
Triglycerides: 158 mg/dL — ABNORMAL HIGH (ref 0.0–149.0)
VLDL: 31.6 mg/dL (ref 0.0–40.0)

## 2011-08-30 LAB — HEPATIC FUNCTION PANEL
AST: 77 U/L — ABNORMAL HIGH (ref 0–37)
Albumin: 4 g/dL (ref 3.5–5.2)
Alkaline Phosphatase: 72 U/L (ref 39–117)
Total Protein: 7.2 g/dL (ref 6.0–8.3)

## 2011-08-30 LAB — BASIC METABOLIC PANEL
Calcium: 9.2 mg/dL (ref 8.4–10.5)
Creatinine, Ser: 1.1 mg/dL (ref 0.4–1.2)
GFR: 53.32 mL/min — ABNORMAL LOW (ref >60.00)
Sodium: 141 mEq/L (ref 135–145)

## 2011-08-30 LAB — TSH: TSH: 2.34 u[IU]/mL (ref 0.35–5.50)

## 2011-09-16 ENCOUNTER — Telehealth: Payer: Self-pay | Admitting: Oncology

## 2011-09-16 NOTE — Telephone Encounter (Signed)
Called pt,left message for upcoming appt in February 2013 lab and MD

## 2011-09-28 ENCOUNTER — Telehealth: Payer: Self-pay | Admitting: Internal Medicine

## 2011-09-28 MED ORDER — ZOSTER VACCINE LIVE 19400 UNT/0.65ML ~~LOC~~ SOLR: 0.6500 mL | Freq: Once | SUBCUTANEOUS | Status: DC

## 2011-09-28 NOTE — Telephone Encounter (Signed)
Patient states that she wants a zostivax presciption sent over to [facility].  Also, she would like written presciptions of metformin, clonindine, and acyclovir mailed to her.

## 2011-09-29 MED ORDER — ACYCLOVIR 5 % EX OINT: 1.0000 "application " | TOPICAL_OINTMENT | CUTANEOUS | Status: DC | PRN

## 2011-09-29 MED ORDER — CLONIDINE HCL 0.2 MG PO TABS: ORAL_TABLET | ORAL | Status: DC

## 2011-09-29 MED ORDER — METFORMIN HCL 500 MG PO TABS: ORAL_TABLET | ORAL | Status: DC

## 2011-09-29 NOTE — Telephone Encounter (Signed)
RX's mailed to patient.

## 2011-10-05 ENCOUNTER — Ambulatory Visit (INDEPENDENT_AMBULATORY_CARE_PROVIDER_SITE_OTHER): Payer: Medicare Other | Admitting: *Deleted

## 2011-10-05 DIAGNOSIS — Z23 Encounter for immunization: Secondary | ICD-10-CM

## 2011-10-05 DIAGNOSIS — Z0000 Encounter for general adult medical examination without abnormal findings: Secondary | ICD-10-CM

## 2011-10-26 ENCOUNTER — Other Ambulatory Visit (HOSPITAL_BASED_OUTPATIENT_CLINIC_OR_DEPARTMENT_OTHER): Payer: Medicare Other

## 2011-10-26 ENCOUNTER — Ambulatory Visit (HOSPITAL_BASED_OUTPATIENT_CLINIC_OR_DEPARTMENT_OTHER): Payer: Medicare Other | Admitting: Oncology

## 2011-10-26 DIAGNOSIS — C50419 Malignant neoplasm of upper-outer quadrant of unspecified female breast: Secondary | ICD-10-CM

## 2011-10-26 DIAGNOSIS — Z978 Presence of other specified devices: Secondary | ICD-10-CM

## 2011-10-26 DIAGNOSIS — C50919 Malignant neoplasm of unspecified site of unspecified female breast: Secondary | ICD-10-CM

## 2011-10-26 DIAGNOSIS — Z170 Estrogen receptor positive status [ER+]: Secondary | ICD-10-CM

## 2011-10-26 DIAGNOSIS — Z79811 Long term (current) use of aromatase inhibitors: Secondary | ICD-10-CM

## 2011-10-26 LAB — CBC WITH DIFFERENTIAL/PLATELET
BASO%: 0.4 % (ref 0.0–2.0)
EOS%: 2.9 % (ref 0.0–7.0)
HCT: 35.3 % (ref 34.8–46.6)
LYMPH%: 31.2 % (ref 14.0–49.7)
MCH: 29.1 pg (ref 25.1–34.0)
MCHC: 34.2 g/dL (ref 31.5–36.0)
MONO%: 10.5 % (ref 0.0–14.0)
NEUT%: 55 % (ref 38.4–76.8)
Platelets: 226 10*3/uL (ref 145–400)

## 2011-10-26 LAB — COMPREHENSIVE METABOLIC PANEL
ALT: 53 U/L — ABNORMAL HIGH (ref 0–35)
AST: 55 U/L — ABNORMAL HIGH (ref 0–37)
Alkaline Phosphatase: 67 U/L (ref 39–117)
CO2: 26 mEq/L (ref 19–32)
Creatinine, Ser: 1.06 mg/dL (ref 0.50–1.10)
Total Bilirubin: 0.9 mg/dL (ref 0.3–1.2)

## 2011-10-26 NOTE — Progress Notes (Signed)
OFFICE PROGRESS NOTE  CC  [name], MD, MD [phone] W. [facility] [address] [place] [address] Dr. 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 Patient opted for a left prophylactic mastectomy.  #2 she was then started on Aromasin 25 mg daily but could not tolerate it and [place] on 01/03/2011 she was started begun on Arimidex and she is tolerating this very well.  #3 patient has been seeing Dr. Shella Spearing from plastics surgery for bilateral breast reconstruction. She is very pleased with her results.  CURRENT THERAPY:Arimidex 1 mg daily since April 2012  INTERVAL HISTORY: [name] 70 y.o. female returns for Follow up visit today. Overall she is doing quite well she just celebrated her 71th birthday. She has just recently adopted a dog. Overall she looks well she is exercising she feels well. She does tell me that the Arimidex does give her hot flashes but not as intense as needed had been with the Aromasin. She do described as her hot flashes as precluding sensation throughout her body and then they subside in immediately. She is denying any fevers chills night sweats headaches shortness of breath chest pains palpitations she has no nausea or vomiting. She does have aches and pains. She does complain of having fatigue and the need for frequent naps during the daytime she takes about 2 a day and then there are days when she does not. She also is having some difficulty sleeping at night. However she relates these to but having now started the daytime. Remainder of the 10 point review of systems is negative.  MEDICAL HISTORY: Past Medical History  Diagnosis Date  . Diabetes mellitus   . IBS (irritable  bowel syndrome)   . Breast cancer   . GERD (gastroesophageal reflux disease)   . Hypertension   . Gilbert syndrome     ALLERGIES:  is allergic to morphine and related; ace inhibitors; and codeine.  MEDICATIONS:  Current Outpatient Prescriptions  Medication Sig Dispense Refill  . acyclovir ointment (ZOVIRAX) 5 % Apply 1 application topically as needed.  30 g  1  . anastrozole (ARIMIDEX) 1 MG tablet Take 1 mg by mouth daily.        . Calcium Carbonate-Vitamin D (CALCIUM + D) 600-200 MG-UNIT TABS Take by mouth.        . Cholecalciferol (VITAMIN D3) 1000 UNITS CAPS Take by mouth.        . cloNIDine (CATAPRES) 0.2 MG tablet TAKE ONE TABLET BY MOUTH TWICE DAILY  180 tablet  2  . losartan-hydrochlorothiazide (HYZAAR) 100-12.5 MG per tablet Take 1 tablet by mouth daily.        . metFORMIN (GLUCOPHAGE) 500 MG tablet TAKE ONE TABLET BY MOUTH TWICE DAILY WITH LARGEST MEALS  180 tablet  1  . Multiple Vitamin (MULTIVITAMIN) capsule Take 1 capsule by mouth daily.        . Omega-3 Fatty Acids (FISH OIL CONCENTRATE) 1000 MG CAPS Take by mouth.        . oxybutynin (OXYTROL) 3.9 MG/24HR Place 1 patch onto the skin 2 (two) times a week.        . pantoprazole (PROTONIX) 40 MG tablet Take 40 mg by mouth daily.        Marland Kitchen  Probiotic Product (ALIGN) 4 MG CAPS Take by mouth.        . Sulfamethoxazole-Trimethoprim (SULFAMETHOXAZOLE-TMP DS PO) Take by mouth as needed.        . zoster vaccine live, PF, (ZOSTAVAX) 16109 UNT/0.65ML injection Inject 19,400 Units into the skin once.  1 each  0    SURGICAL HISTORY:  Past Surgical History  Procedure Date  . Abdominal hysterectomy 1976    with BSO due to infection from [facility] IUD  . Breast lumpectomy     [id number], [phone]  . Mastectomy w/ nodes partial 2012    double mastectomy with nodes taken out on right side   . Colonoscopy w/ polypectomy 2006    negative 2011; Dr Leone Payor  . Appendectomy 1976    @ TAH & BSO  . Tonsillectomy and  adenoidectomy     REVIEW OF SYSTEMS:  Pertinent items are noted in HPI.   PHYSICAL EXAMINATION: General appearance: alert, cooperative and appears stated age Neck: no adenopathy, no carotid bruit, no JVD, supple, symmetrical, trachea midline and thyroid not enlarged, symmetric, no tenderness/mass/nodules Lymph nodes: Cervical, supraclavicular, and axillary nodes normal. Resp: clear to auscultation bilaterally and normal percussion bilaterally Back: symmetric, no curvature. ROM normal. No CVA tenderness. Cardio: regular rate and rhythm, S1, S2 normal, no murmur, click, rub or gallop GI: soft, non-tender; bowel sounds normal; no masses,  no organomegaly Extremities: extremities normal, atraumatic, no cyanosis or edema Neurologic: Alert and oriented X 3, normal strength and tone. Normal symmetric reflexes. Normal coordination and gait  ECOG PERFORMANCE STATUS: 0 - Asymptomatic  Blood pressure 121/71, pulse 88, temperature 97.9 F (36.6 C), temperature source Oral, height 5\' 5"  (1.651 m), weight 176 lb 8 oz (80.06 kg).  LABORATORY DATA: Lab Results  Component Value Date   WBC 5.2 10/26/2011   HGB 12.1 10/26/2011   HCT 35.3 10/26/2011   MCV 85.0 10/26/2011   PLT 226 10/26/2011      Chemistry      Component Value Date/Time   NA 141 08/29/2011 1038   K 3.8 08/29/2011 1038   CL 105 08/29/2011 1038   CO2 26 08/29/2011 1038   BUN 16 08/29/2011 1038   CREATININE 1.1 08/29/2011 1038      Component Value Date/Time   CALCIUM 9.2 08/29/2011 1038   ALKPHOS 72 08/29/2011 1038   AST 77* 08/29/2011 1038   ALT 81* 08/29/2011 1038   BILITOT 0.8 08/29/2011 1038       RADIOGRAPHIC STUDIES:  No results found.  ASSESSMENT: 71 year old female with  #1 stage I invasive ductal carcinoma measuring 0.9 cm grade 2 ER positive. Patient is status post right mastectomy followed by reconstruction and a prophylactic left mastectomy.  #2 patient is now on Arimidex 1 mg daily since April 2012 tolerating it  very well.   PLAN:   #1 continue Arimidex 1 mg daily.  #2 patient will be seen back in 6 months time for followup. Of course we will see her sooner if need arises.   All questions were answered. The patient knows to call the clinic with any problems, questions or concerns. We can certainly see the patient much sooner if necessary.  I spent 30 minutes counseling the patient face to face. The total time spent in the appointment was 30 minutes.    Drue Second, MD Medical/Oncology [facility] [phone] (beeper) [phone] (Office)  10/26/2011, 11:39 AM

## 2011-11-27 ENCOUNTER — Other Ambulatory Visit: Payer: Self-pay | Admitting: Internal Medicine

## 2011-11-27 NOTE — Telephone Encounter (Signed)
Prescription sent to pharmacy.

## 2011-12-12 ENCOUNTER — Other Ambulatory Visit: Payer: Self-pay | Admitting: Internal Medicine

## 2011-12-14 ENCOUNTER — Telehealth: Payer: Self-pay | Admitting: Oncology

## 2011-12-14 NOTE — Telephone Encounter (Signed)
S/w the pt and she is aware of her r/s lab and md appts from 04/25/2012 to 05/02/2012 due to the md is on vac

## 2012-02-03 IMAGING — CR DG CHEST 2V
2 series · 2 of 2 positions shown · non-contrast
Comparison: Chest x-ray of 08/11/2008

CLINICAL DATA: Preop for surgery for right breast carcinoma

CHEST - 2 VIEW

[view not recorded (1 of 2)]
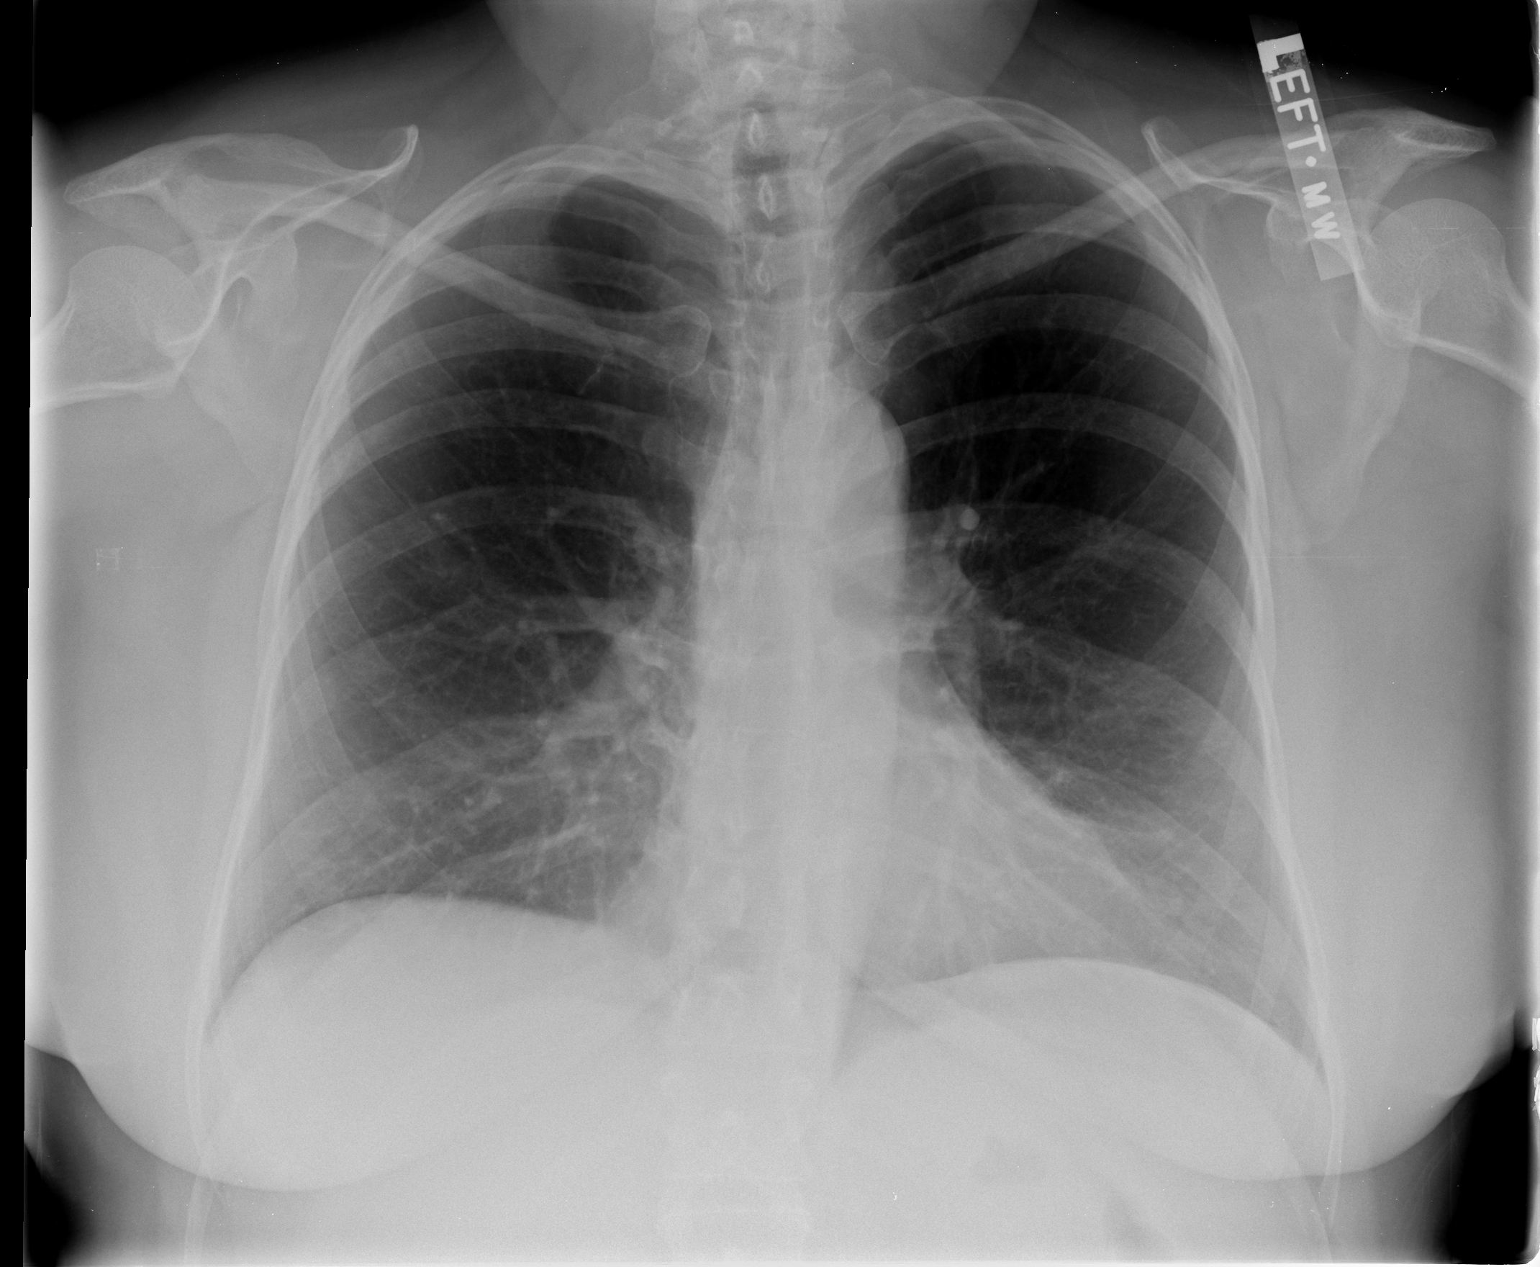

[view not recorded (2 of 2)]
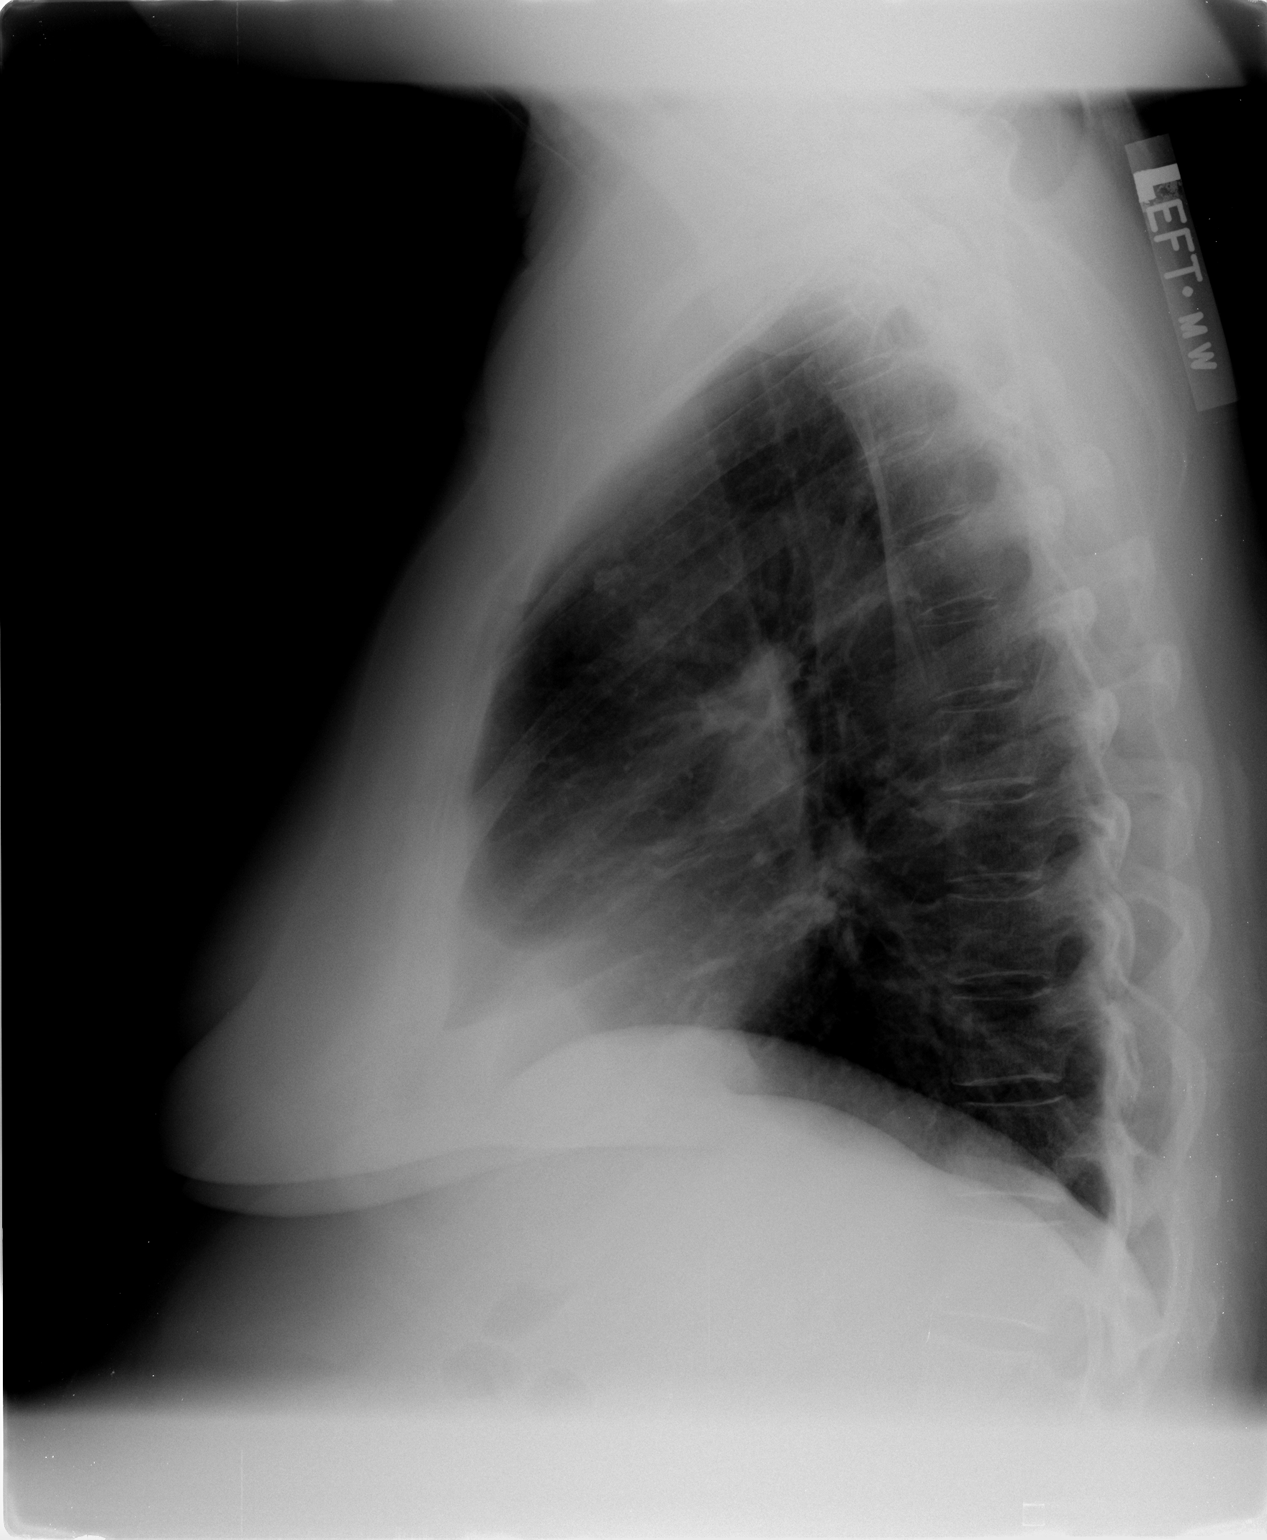

[2 of 2 positions shown; findings below may reference images not displayed]

FINDINGS: No active infiltrate or effusion is seen.  On the lateral
view there is a density noted anteriorly near the retrosternal air
space which is present but less well seen on prior lateral chest x-
ray.  This probably represent some calcification of the ascending
aorta and is of doubtful significance.  The heart is within normal
limits in size.  No bony abnormality is noted.
IMPRESSION: Stable chest x-ray.  No active lung disease.

## 2012-04-25 ENCOUNTER — Ambulatory Visit: Payer: Medicare Other | Admitting: Oncology

## 2012-04-25 ENCOUNTER — Other Ambulatory Visit: Payer: Medicare Other | Admitting: Lab

## 2012-05-02 ENCOUNTER — Telehealth: Payer: Self-pay | Admitting: *Deleted

## 2012-05-02 ENCOUNTER — Encounter: Payer: Self-pay | Admitting: Oncology

## 2012-05-02 ENCOUNTER — Ambulatory Visit (HOSPITAL_BASED_OUTPATIENT_CLINIC_OR_DEPARTMENT_OTHER): Payer: Medicare Other | Admitting: Oncology

## 2012-05-02 ENCOUNTER — Other Ambulatory Visit (HOSPITAL_BASED_OUTPATIENT_CLINIC_OR_DEPARTMENT_OTHER): Payer: Medicare Other | Admitting: Lab

## 2012-05-02 VITALS — BP 125/77 | HR 77 | Temp 98.5°F | Resp 20 | Ht 65.0 in | Wt 178.0 lb

## 2012-05-02 DIAGNOSIS — Z170 Estrogen receptor positive status [ER+]: Secondary | ICD-10-CM

## 2012-05-02 DIAGNOSIS — C50419 Malignant neoplasm of upper-outer quadrant of unspecified female breast: Secondary | ICD-10-CM

## 2012-05-02 DIAGNOSIS — Z853 Personal history of malignant neoplasm of breast: Secondary | ICD-10-CM

## 2012-05-02 DIAGNOSIS — C50919 Malignant neoplasm of unspecified site of unspecified female breast: Secondary | ICD-10-CM

## 2012-05-02 LAB — CBC WITH DIFFERENTIAL/PLATELET
BASO%: 0.5 % (ref 0.0–2.0)
EOS%: 3.2 % (ref 0.0–7.0)
MCH: 28.7 pg (ref 25.1–34.0)
MCHC: 34.1 g/dL (ref 31.5–36.0)
MCV: 84.2 fL (ref 79.5–101.0)
MONO%: 12.3 % (ref 0.0–14.0)
RBC: 4.07 10*6/uL (ref 3.70–5.45)
RDW: 14.6 % — ABNORMAL HIGH (ref 11.2–14.5)
lymph#: 1.8 10*3/uL (ref 0.9–3.3)

## 2012-05-02 LAB — COMPREHENSIVE METABOLIC PANEL
ALT: 67 U/L — ABNORMAL HIGH (ref 0–35)
AST: 69 U/L — ABNORMAL HIGH (ref 0–37)
Albumin: 4.1 g/dL (ref 3.5–5.2)
Alkaline Phosphatase: 70 U/L (ref 39–117)
BUN: 14 mg/dL (ref 6–23)
Calcium: 9.2 mg/dL (ref 8.4–10.5)
Chloride: 105 mEq/L (ref 96–112)
Potassium: 4.2 mEq/L (ref 3.5–5.3)

## 2012-05-02 NOTE — Telephone Encounter (Signed)
Made patient appointment for 2014 printed out calendar and gave to the patient

## 2012-05-02 NOTE — Progress Notes (Signed)
OFFICE PROGRESS NOTE  CC  [name], MD [phone] [address]. [facility] [address] [place] [address] Dr. 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 Patient opted for a left prophylactic mastectomy.  #2 she was then started on Aromasin 25 mg daily but could not tolerate it and [place] on 01/03/2011 she was started begun on Arimidex But unfortunately she is unable to tolerate the Arimidex either. She therefore discontinued it on 03/22/2012. After discontinuing it she felt much better.  #3 patient has been seeing Dr. Shella Spearing from plastics surgery for bilateral breast reconstruction. She is very pleased with her results.  CURRENT THERAPY: Observation  INTERVAL HISTORY: [name] 71 y.o. female returns for Follow up visit today. Overall patient is doing well today. She is no longer on her Arimidex. She tells me that the side effects that she experienced including fatigue aches pains, inability to carry on with activities of daily life and being not being able to get out of the house. Therefore she discontinued it and after a few weeks she felt much better. She feels that any of these aromatase inhibitors are probably not the medications for her to take. She feels that her quality of life was very compromised. And she does not think that the benefits of these drugs are therefore her to compromise her life quality. She now is very active in the outdoors. She otherwise is doing well. She continues to see Dr. Alwyn Ren on a regular basis.   MEDICAL HISTORY: Past Medical History  Diagnosis Date  . Diabetes mellitus   . IBS (irritable bowel syndrome)   . Breast cancer   . GERD (gastroesophageal reflux disease)   .  Hypertension   . Gilbert syndrome     ALLERGIES:  is allergic to morphine and related; ace inhibitors; and codeine.  MEDICATIONS:  Current Outpatient Prescriptions  Medication Sig Dispense Refill  . acyclovir ointment (ZOVIRAX) 5 % Apply 1 application topically as needed.  30 g  1  . Calcium Carbonate-Vitamin D (CALCIUM + D) 600-200 MG-UNIT TABS Take by mouth.        . Cholecalciferol (VITAMIN D3) 1000 UNITS CAPS Take by mouth.        . cloNIDine (CATAPRES) 0.2 MG tablet TAKE ONE TABLET BY MOUTH TWICE DAILY  180 tablet  2  . HYZAAR 100-12.5 MG per tablet TAKE 1 TABLET ONCE DAILY.  30 each  6  . metFORMIN (GLUCOPHAGE) 500 MG tablet TAKE ONE TABLET BY MOUTH TWICE DAILY WITH LARGEST MEALS  180 tablet  1  . Multiple Vitamin (MULTIVITAMIN) capsule Take 1 capsule by mouth daily.        . Omega-3 Fatty Acids (FISH OIL CONCENTRATE) 1000 MG CAPS Take by mouth.        . oxybutynin (OXYTROL) 3.9 MG/24HR Place 1 patch onto the skin 2 (two) times a week.        . Probiotic Product (ALIGN) 4 MG CAPS Take by mouth.        Marland Kitchen PROTONIX 40 MG tablet TAKE 1 TABLET 30 MINUTES BEFORE A MEAL.  31 each  9  . Sulfamethoxazole-Trimethoprim (SULFAMETHOXAZOLE-TMP DS PO) Take by mouth as needed.        . zoster vaccine  live, PF, (ZOSTAVAX) 91478 UNT/0.65ML injection Inject 19,400 Units into the skin once.  1 each  0  . anastrozole (ARIMIDEX) 1 MG tablet Take 1 mg by mouth daily.          SURGICAL HISTORY:  Past Surgical History  Procedure Date  . Abdominal hysterectomy 1976    with BSO due to infection from [facility] IUD  . Breast lumpectomy     [id number], [phone]  . Mastectomy w/ nodes partial 2012    double mastectomy with nodes taken out on right side   . Colonoscopy w/ polypectomy 2006    negative 2011; Dr Leone Payor  . Appendectomy 1976    @ TAH & BSO  . Tonsillectomy and adenoidectomy     REVIEW OF SYSTEMS:  Pertinent items are noted in HPI.   PHYSICAL EXAMINATION: General  appearance: alert, cooperative and appears stated age Neck: no adenopathy, no carotid bruit, no JVD, supple, symmetrical, trachea midline and thyroid not enlarged, symmetric, no tenderness/mass/nodules Lymph nodes: Cervical, supraclavicular, and axillary nodes normal. Resp: clear to auscultation bilaterally and normal percussion bilaterally Back: symmetric, no curvature. ROM normal. No CVA tenderness. Cardio: regular rate and rhythm, S1, S2 normal, no murmur, click, rub or gallop GI: soft, non-tender; bowel sounds normal; no masses,  no organomegaly Extremities: extremities normal, atraumatic, no cyanosis or edema Neurologic: Alert and oriented X 3, normal strength and tone. Normal symmetric reflexes. Normal coordination and gait  ECOG PERFORMANCE STATUS: 0 - Asymptomatic  Blood pressure 125/77, pulse 77, temperature 98.5 F (36.9 C), temperature source Oral, resp. rate 20, height 5\' 5"  (1.651 m), weight 178 lb (80.74 kg).  LABORATORY DATA: Lab Results  Component Value Date   WBC 5.1 05/02/2012   HGB 11.7 05/02/2012   HCT 34.3* 05/02/2012   MCV 84.2 05/02/2012   PLT 211 05/02/2012      Chemistry      Component Value Date/Time   NA 140 10/26/2011 1010   K 3.9 10/26/2011 1010   CL 104 10/26/2011 1010   CO2 26 10/26/2011 1010   BUN 19 10/26/2011 1010   CREATININE 1.06 10/26/2011 1010      Component Value Date/Time   CALCIUM 9.7 10/26/2011 1010   ALKPHOS 67 10/26/2011 1010   AST 55* 10/26/2011 1010   ALT 53* 10/26/2011 1010   BILITOT 0.9 10/26/2011 1010       RADIOGRAPHIC STUDIES:  No results found.  ASSESSMENT: 71 year old female with  #1 stage I invasive ductal carcinoma measuring 0.9 cm grade 2 ER positive. Patient is status post right mastectomy followed by reconstruction and a prophylactic left mastectomy.Patient was begun on Aromasin postmastectomy but she could not tolerate it due to aches pains and significant incapacitating fatigue. We therefore switched her to Arimidex 1 mg daily  starting in April 2012 and unfortunately she could not tolerate that either. She discontinued it in July 2013.Patient now wants just to be observed every 6 months.    PLAN:   #1. Patient will stay off of Arimidex now she is unable to tolerate it and she declines anything else that she feels that her quality of life is considered to be compromised.  #2 patient will be seen back in 6 months time for followup. Of course we will see her sooner if need arises.   All questions were answered. The patient knows to call the clinic with any problems, questions or concerns. We can certainly see the patient much sooner if necessary.  I spent 30 minutes counseling  the patient face to face. The total time spent in the appointment was 30 minutes.    Drue Second, MD Medical/Oncology [facility] [phone] (beeper) [phone] (Office)  05/02/2012, 12:43 PM

## 2012-05-02 NOTE — Patient Instructions (Addendum)
I will see you back in 6 months with blood work

## 2012-06-03 ENCOUNTER — Other Ambulatory Visit: Payer: Self-pay | Admitting: Internal Medicine

## 2012-06-03 NOTE — Telephone Encounter (Signed)
A1C 250.00 

## 2012-07-17 ENCOUNTER — Other Ambulatory Visit: Payer: Self-pay | Admitting: Internal Medicine

## 2012-07-17 NOTE — Telephone Encounter (Signed)
refill Losartan HCTZ (Tab) 100-12.5 MG TAKE 1 TABLET ONCE DAILY.  #30--last fill 09.15.13--last ov V70 12.11.12

## 2012-07-18 MED ORDER — LOSARTAN POTASSIUM-HCTZ 100-12.5 MG PO TABS: ORAL_TABLET | ORAL | Status: DC

## 2012-07-18 NOTE — Telephone Encounter (Signed)
RX sent in to [facility]. I called [facility] and cancelled rx there for it was sent in error

## 2012-08-14 ENCOUNTER — Encounter: Payer: Self-pay | Admitting: Internal Medicine

## 2012-08-14 ENCOUNTER — Ambulatory Visit (INDEPENDENT_AMBULATORY_CARE_PROVIDER_SITE_OTHER): Payer: Medicare Other | Admitting: Internal Medicine

## 2012-08-14 VITALS — BP 124/80 | HR 94 | Resp 12 | Ht 65.5 in | Wt 176.2 lb

## 2012-08-14 DIAGNOSIS — E785 Hyperlipidemia, unspecified: Secondary | ICD-10-CM

## 2012-08-14 DIAGNOSIS — E119 Type 2 diabetes mellitus without complications: Secondary | ICD-10-CM

## 2012-08-14 DIAGNOSIS — I10 Essential (primary) hypertension: Secondary | ICD-10-CM

## 2012-08-14 DIAGNOSIS — Z0000 Encounter for general adult medical examination without abnormal findings: Secondary | ICD-10-CM

## 2012-08-14 DIAGNOSIS — Z23 Encounter for immunization: Secondary | ICD-10-CM

## 2012-08-14 LAB — MICROALBUMIN / CREATININE URINE RATIO
Creatinine,U: 173.5 mg/dL
Microalb, Ur: 0.3 mg/dL (ref 0.0–1.9)

## 2012-08-14 LAB — HEPATIC FUNCTION PANEL
Bilirubin, Direct: 0.1 mg/dL (ref 0.0–0.3)
Total Bilirubin: 1 mg/dL (ref 0.3–1.2)

## 2012-08-14 LAB — BASIC METABOLIC PANEL
BUN: 17 mg/dL (ref 6–23)
CO2: 28 mEq/L (ref 19–32)
Calcium: 9.8 mg/dL (ref 8.4–10.5)
Chloride: 103 mEq/L (ref 96–112)
Creatinine, Ser: 1.1 mg/dL (ref 0.4–1.2)

## 2012-08-14 LAB — LIPID PANEL
LDL Cholesterol: 124 mg/dL — ABNORMAL HIGH (ref 0–99)
VLDL: 30.8 mg/dL (ref 0.0–40.0)

## 2012-08-14 LAB — TSH: TSH: 2.47 u[IU]/mL (ref 0.35–5.50)

## 2012-08-14 MED ORDER — CLONIDINE HCL 0.2 MG PO TABS: ORAL_TABLET | ORAL | Status: DC

## 2012-08-14 MED ORDER — LOSARTAN POTASSIUM-HCTZ 100-12.5 MG PO TABS: ORAL_TABLET | ORAL | Status: DC

## 2012-08-14 MED ORDER — METFORMIN HCL 500 MG PO TABS: 500.0000 mg | ORAL_TABLET | Freq: Two times a day (BID) | ORAL | Status: DC

## 2012-08-14 NOTE — Patient Instructions (Addendum)
Preventive Health Care: Exercise  30-45  minutes a day, 3-4 days a week. Walking is especially valuable in preventing Osteoporosis. Eat a low-fat diet with lots of fruits and vegetables, up to 7-9 servings per day.  Consume less than 30 grams of sugar per day from foods & drinks with High Fructose Corn Syrup as # 1,2,3 or #4 on label. Blood Pressure Goal  Ideally is an AVERAGE < 135/85. This AVERAGE should be calculated from @ least 5-7 BP readings taken @ different times of day on different days of week. You should not respond to isolated BP readings , but rather the AVERAGE for that week. If you activate My Chart; the results can be released to you as soon as they populate from the lab. If you choose not to use this program; the labs have to be reviewed, copied & mailed  causing a delay in getting the results to you.  

## 2012-08-14 NOTE — Progress Notes (Signed)
Subjective:    Patient ID: [name], female    DOB: 1941/02/04, 71 y.o.   MRN: [id number]  HPI Medicare Wellness Visit:  The following psychosocial & medical history were reviewed as required by Medicare.   Social history: caffeine: 2-3 diet green tea/ day , alcohol:  no,  tobacco use : quit 1979  & exercise : walking 30 min daily.   Home & personal  safety / fall risk: no issues, activities of daily living: no limitations , seatbelt use : yes , and smoke alarm employment : yes .  Power of Attorney/Living Will status :in place  Vision ( as recorded per Nurse) & Hearing  evaluation : Ophth exam 5/13; no hearing exam Orientation :oriented X3 , memory & recall :good,  math testing: good,and mood & affect : normal . Depression / anxiety: denied Travel history : last 49 [place] , immunization status :? Tetanus status , transfusion history:  45 & @ age 38 post dental extractions, and preventive health surveillance ( colonoscopies, BMD , etc as per protocol/ [facility]): colonoscopy up to date, Dental care:  Seen every 6 mos . Chart reviewed &  Updated. Active issues reviewed & addressed.       Review of Systems HYPERTENSION: Disease Monitoring: Blood pressure range-130s/70s or less  Chest pain, palpitations- no      Dyspnea- no Medications: Compliance- yes  Lightheadedness,Syncope-no    Edema- no  DIABETES: Disease Monitoring: Blood Sugar ranges- 100-130  Polyuria/phagia/dipsia- no      Visual problems- no Medications: Compliance- no  Hypoglycemic symptoms- no  HYPERLIPIDEMIA: Disease Monitoring: See symptoms for Hypertension Medications: Compliance- no statin Abd pain, bowel changes- occasional diarrhea   Muscle aches- no         Objective:   Physical Exam Gen.:  well-nourished in appearance. Alert, appropriate and cooperative throughout exam. Head: Normocephalic without obvious abnormalities Eyes: No corneal or conjunctival inflammation noted. Pupils equal  round reactive to light and accommodation. Fundal exam is benign without hemorrhages, exudate, papilledema. Extraocular motion intact. Vision grossly normal. Ears: External  ear exam reveals no significant lesions or deformities. Some wax in canals but hearing is grossly normal bilaterally. Nose: External nasal exam reveals no deformity or inflammation. Nasal mucosa are pink and moist. No lesions or exudates noted.  Mouth: Oral mucosa and oropharynx reveal no lesions or exudates. Teeth in good repair. Neck: No deformities, masses, or tenderness noted. Range of motion & Thyroid normal. Lungs: Normal respiratory effort; chest expands symmetrically. Lungs are clear to auscultation without rales, wheezes, or increased work of breathing. Heart: Normal rate and rhythm. Normal S1 and S2. No gallop, click, or rub. S4 with slight slurring at  LSB . Abdomen: Bowel sounds normal; abdomen soft and nontender. No masses, organomegaly or hernias noted. Genitalia:S/P TAH/BSO Musculoskeletal/extremities: Slightly accentuated curvature of upper thoracic  spine. . No clubbing, cyanosis, edema, or deformity noted. Range of motion  normal .Tone & strength  normal.Joints normal. Nail health  good. Vascular: Carotid, radial artery, dorsalis pedis and  posterior tibial pulses are full and equal. No bruits present. Neurologic: Alert and oriented x3. Deep tendon reflexes symmetrical and normal.          Skin: Intact without suspicious lesions or rashes. Lymph: No cervical, axillary lymphadenopathy present. Psych: Mood and affect are normal. Normally interactive  Assessment & Plan:  #1 Medicare Wellness Exam; criteria met ; data entered #2 Problem List reviewed ; Assessment/ Recommendations made Plan: see Orders

## 2012-09-04 ENCOUNTER — Encounter: Payer: Medicare Other | Admitting: Internal Medicine

## 2012-10-19 ENCOUNTER — Other Ambulatory Visit: Payer: Self-pay | Admitting: Internal Medicine

## 2012-10-31 ENCOUNTER — Other Ambulatory Visit: Payer: Medicare Other | Admitting: Lab

## 2012-10-31 ENCOUNTER — Ambulatory Visit: Payer: Medicare Other | Admitting: Oncology

## 2012-12-25 ENCOUNTER — Ambulatory Visit (HOSPITAL_BASED_OUTPATIENT_CLINIC_OR_DEPARTMENT_OTHER): Payer: Medicare Other | Admitting: Oncology

## 2012-12-25 ENCOUNTER — Encounter: Payer: Self-pay | Admitting: Oncology

## 2012-12-25 ENCOUNTER — Telehealth: Payer: Self-pay | Admitting: *Deleted

## 2012-12-25 ENCOUNTER — Other Ambulatory Visit (HOSPITAL_BASED_OUTPATIENT_CLINIC_OR_DEPARTMENT_OTHER): Payer: Medicare Other | Admitting: Lab

## 2012-12-25 VITALS — BP 151/79 | HR 77 | Temp 98.5°F | Resp 20 | Ht 65.5 in | Wt 176.5 lb

## 2012-12-25 DIAGNOSIS — Z853 Personal history of malignant neoplasm of breast: Secondary | ICD-10-CM

## 2012-12-25 DIAGNOSIS — C50419 Malignant neoplasm of upper-outer quadrant of unspecified female breast: Secondary | ICD-10-CM

## 2012-12-25 DIAGNOSIS — Z170 Estrogen receptor positive status [ER+]: Secondary | ICD-10-CM

## 2012-12-25 LAB — CBC WITH DIFFERENTIAL/PLATELET
BASO%: 0.4 % (ref 0.0–2.0)
EOS%: 3 % (ref 0.0–7.0)
LYMPH%: 34.1 % (ref 14.0–49.7)
MCHC: 33.4 g/dL (ref 31.5–36.0)
MONO#: 0.5 10*3/uL (ref 0.1–0.9)
Platelets: 194 10*3/uL (ref 145–400)
RBC: 4.21 10*6/uL (ref 3.70–5.45)
WBC: 4.7 10*3/uL (ref 3.9–10.3)
lymph#: 1.6 10*3/uL (ref 0.9–3.3)

## 2012-12-25 LAB — COMPREHENSIVE METABOLIC PANEL (CC13)
Albumin: 3.3 g/dL — ABNORMAL LOW (ref 3.5–5.0)
Alkaline Phosphatase: 69 U/L (ref 40–150)
BUN: 18.2 mg/dL (ref 7.0–26.0)
CO2: 25 mEq/L (ref 22–29)
Glucose: 173 mg/dl — ABNORMAL HIGH (ref 70–99)
Potassium: 3.4 mEq/L — ABNORMAL LOW (ref 3.5–5.1)
Total Protein: 7 g/dL (ref 6.4–8.3)

## 2012-12-25 NOTE — Progress Notes (Signed)
OFFICE PROGRESS NOTE  CC  [name], MD [phone] W. [facility] [address] [place] [address] Dr. 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 Patient opted for a left prophylactic mastectomy.  #2 she was then started on Aromasin 25 mg daily but could not tolerate it and [place] on 01/03/2011 she was started begun on Arimidex But unfortunately she is unable to tolerate the Arimidex either. She therefore discontinued it on 03/22/2012. After discontinuing it she felt much better.  #3 patient has been seeing Dr. Shella Spearing from plastics surgery for bilateral breast reconstruction. She is very pleased with her results.  CURRENT THERAPY: Observation  INTERVAL HISTORY: [name] 72 y.o. female returns for Follow up visit today. Overall patient is doing well today.  She has no complaints. She denies any fevers chills night sweats headaches shortness of breath chest pains or palpitations. Remainder of the 10 point review of systems is negative.  MEDICAL HISTORY: Past Medical History  Diagnosis Date  . Diabetes mellitus   . IBS (irritable bowel syndrome)   . Breast cancer 2012  . GERD (gastroesophageal reflux disease)   . Hypertension   . Gilbert syndrome     ALLERGIES:  is allergic to sulfur; morphine and related; ace inhibitors; and codeine.  MEDICATIONS:  Current Outpatient Prescriptions  Medication Sig Dispense Refill  . acyclovir ointment (ZOVIRAX) 5 % Apply 1 application topically as needed.  30 g  1  . Calcium Carbonate-Vitamin D (CALCIUM + D) 600-200 MG-UNIT TABS Take 1 tablet by mouth 2 (two) times daily.       . Cholecalciferol (VITAMIN D3) 1000 UNITS CAPS Take 1 capsule by mouth 2 (two)  times daily.       . cloNIDine (CATAPRES) 0.2 MG tablet TAKE ONE TABLET BY MOUTH TWICE DAILY  180 tablet  3  . losartan-hydrochlorothiazide (HYZAAR) 100-12.5 MG per tablet TAKE 1 TABLET ONCE DAILY.  90 tablet  3  . metFORMIN (GLUCOPHAGE) 500 MG tablet Take 1 tablet (500 mg total) by mouth 2 (two) times daily with a meal.  180 tablet  3  . Multiple Vitamin (MULTIVITAMIN) capsule Take 1 capsule by mouth daily.        . Omega-3 Fatty Acids (FISH OIL CONCENTRATE) 1000 MG CAPS Take by mouth 2 (two) times daily.       Marland Kitchen oxybutynin (OXYTROL) 3.9 MG/24HR Place 1 patch onto the skin 2 (two) times a week.        . pantoprazole (PROTONIX) 40 MG tablet TAKE 1 TABLET 30 MINUTES BEFORE A MEAL.  30 tablet  8  . nitrofurantoin (MACRODANTIN) 100 MG capsule Take 100 mg by mouth as needed. Rx'ed by urologist      . Probiotic Product (ALIGN) 4 MG CAPS Take by mouth as needed.        No current facility-administered medications for this visit.    SURGICAL HISTORY:  Past Surgical History  Procedure Laterality Date  . Abdominal hysterectomy  1976    with BSO due to infection from [facility] IUD  . Breast lumpectomy      [id number], [phone]  . Mastectomy w/ nodes partial  2012    double mastectomy with nodes taken out on right side   .  Colonoscopy w/ polypectomy  2006    negative 2011; Dr Leone Payor  . Appendectomy  1976    @ [facility] & BSO  . Tonsillectomy and adenoidectomy    . Breast enhancement surgery  04/2011    Dr Kelly Splinter, [facility]    REVIEW OF SYSTEMS:  Pertinent items are noted in HPI.   PHYSICAL EXAMINATION: General appearance: alert, cooperative and appears stated age Neck: no adenopathy, no carotid bruit, no JVD, supple, symmetrical, trachea midline and thyroid not enlarged, symmetric, no tenderness/mass/nodules Lymph nodes: Cervical, supraclavicular, and axillary nodes normal. Resp: clear to auscultation bilaterally and normal percussion bilaterally Back: symmetric, no curvature. ROM  normal. No CVA tenderness. Cardio: regular rate and rhythm, S1, S2 normal, no murmur, click, rub or gallop GI: soft, non-tender; bowel sounds normal; no masses,  no organomegaly Extremities: extremities normal, atraumatic, no cyanosis or edema Neurologic: Alert and oriented X 3, normal strength and tone. Normal symmetric reflexes. Normal coordination and gait  ECOG PERFORMANCE STATUS: 0 - Asymptomatic  Blood pressure 151/79, pulse 77, temperature 98.5 F (36.9 C), temperature source Oral, resp. rate 20, height 5' 5.5" (1.664 m), weight 176 lb 8 oz (80.06 kg).  LABORATORY DATA: Lab Results  Component Value Date   WBC 4.7 12/25/2012   HGB 11.7 12/25/2012   HCT 35.0 12/25/2012   MCV 83.1 12/25/2012   PLT 194 12/25/2012      Chemistry      Component Value Date/Time   NA 142 12/25/2012 0827   NA 139 08/14/2012 1023   K 3.4* 12/25/2012 0827   K 3.7 08/14/2012 1023   CL 106 12/25/2012 0827   CL 103 08/14/2012 1023   CO2 25 12/25/2012 0827   CO2 28 08/14/2012 1023   BUN 18.2 12/25/2012 0827   BUN 17 08/14/2012 1023   CREATININE 1.1 12/25/2012 0827   CREATININE 1.1 08/14/2012 1023      Component Value Date/Time   CALCIUM 9.5 12/25/2012 0827   CALCIUM 9.8 08/14/2012 1023   ALKPHOS 69 12/25/2012 0827   ALKPHOS 67 08/14/2012 1023   AST 40* 12/25/2012 0827   AST 65* 08/14/2012 1023   ALT 42 12/25/2012 0827   ALT 72* 08/14/2012 1023   BILITOT 0.92 12/25/2012 0827   BILITOT 1.0 08/14/2012 1023       RADIOGRAPHIC STUDIES:  No results found.  ASSESSMENT: 72 year old female with  #1 stage I invasive ductal carcinoma measuring 0.9 cm grade 2 ER positive. Patient is status post right mastectomy followed by reconstruction and a prophylactic left mastectomy.Patient was begun on Aromasin postmastectomy but she could not tolerate it due to aches pains and significant incapacitating fatigue. We therefore switched her to Arimidex 1 mg daily starting in April 2012 and unfortunately she could not tolerate that either.  She discontinued it in July 2013.Patient now wants just to be observed every 6 months.No evidence of recurrent disease    PLAN:   #1.Continue to observe and see her every 6 months for the first 5 years  2. Her potassium is low I have encouraged her to increase foods rich in Postassium  I spent 15 minutes counseling the patient face to face. The total time spent in the appointment was 30 minutes.    Drue Second, MD Medical/Oncology [facility] [phone] (beeper) [phone] (Office)  12/25/2012, 9:16 AM

## 2012-12-25 NOTE — Patient Instructions (Addendum)
Doing well  Your potassium is slightly low, eat foods rich in potassium  I will see back in 6 months

## 2012-12-25 NOTE — Telephone Encounter (Signed)
appts made and printed 

## 2012-12-26 ENCOUNTER — Telehealth: Payer: Self-pay | Admitting: *Deleted

## 2012-12-26 NOTE — Telephone Encounter (Signed)
Pt demanded tht she want the 6 month visit and not the year. Both was stated in the pof .gv pt a 6 month ov at her request.

## 2013-01-16 LAB — HM DIABETES EYE EXAM

## 2013-06-20 ENCOUNTER — Telehealth: Payer: Self-pay | Admitting: Oncology

## 2013-06-20 NOTE — Telephone Encounter (Signed)
, °

## 2013-06-26 ENCOUNTER — Other Ambulatory Visit: Payer: Medicare Other | Admitting: Lab

## 2013-06-26 ENCOUNTER — Ambulatory Visit: Payer: Medicare Other | Admitting: Oncology

## 2013-07-07 ENCOUNTER — Other Ambulatory Visit: Payer: Medicare Other | Admitting: Lab

## 2013-07-07 ENCOUNTER — Ambulatory Visit: Payer: Medicare Other | Admitting: Oncology

## 2013-07-15 ENCOUNTER — Telehealth: Payer: Self-pay | Admitting: Oncology

## 2013-07-15 NOTE — Telephone Encounter (Signed)
m, °

## 2013-07-23 ENCOUNTER — Telehealth: Payer: Self-pay | Admitting: Oncology

## 2013-07-23 NOTE — Telephone Encounter (Signed)
, °

## 2013-07-24 ENCOUNTER — Ambulatory Visit: Payer: Medicare Other | Admitting: Family

## 2013-07-24 ENCOUNTER — Other Ambulatory Visit: Payer: Medicare Other | Admitting: Lab

## 2013-07-30 ENCOUNTER — Ambulatory Visit (HOSPITAL_BASED_OUTPATIENT_CLINIC_OR_DEPARTMENT_OTHER): Payer: Medicare Other | Admitting: Family

## 2013-07-30 ENCOUNTER — Telehealth: Payer: Self-pay | Admitting: Oncology

## 2013-07-30 ENCOUNTER — Encounter: Payer: Self-pay | Admitting: Family

## 2013-07-30 ENCOUNTER — Encounter (INDEPENDENT_AMBULATORY_CARE_PROVIDER_SITE_OTHER): Payer: Self-pay

## 2013-07-30 ENCOUNTER — Other Ambulatory Visit (HOSPITAL_BASED_OUTPATIENT_CLINIC_OR_DEPARTMENT_OTHER): Payer: Medicare Other

## 2013-07-30 VITALS — BP 134/81 | HR 84 | Temp 98.5°F | Resp 18 | Ht 65.0 in | Wt 179.4 lb

## 2013-07-30 DIAGNOSIS — C50919 Malignant neoplasm of unspecified site of unspecified female breast: Secondary | ICD-10-CM

## 2013-07-30 DIAGNOSIS — Z170 Estrogen receptor positive status [ER+]: Secondary | ICD-10-CM

## 2013-07-30 DIAGNOSIS — Z853 Personal history of malignant neoplasm of breast: Secondary | ICD-10-CM

## 2013-07-30 DIAGNOSIS — C50419 Malignant neoplasm of upper-outer quadrant of unspecified female breast: Secondary | ICD-10-CM

## 2013-07-30 LAB — COMPREHENSIVE METABOLIC PANEL (CC13)
Albumin: 3.5 g/dL (ref 3.5–5.0)
Alkaline Phosphatase: 77 U/L (ref 40–150)
Anion Gap: 10 mEq/L (ref 3–11)
BUN: 15.2 mg/dL (ref 7.0–26.0)
CO2: 27 mEq/L (ref 22–29)
Calcium: 10.5 mg/dL — ABNORMAL HIGH (ref 8.4–10.4)
Chloride: 105 mEq/L (ref 98–109)
Glucose: 133 mg/dl (ref 70–140)
Potassium: 4 mEq/L (ref 3.5–5.1)
Sodium: 142 mEq/L (ref 136–145)
Total Protein: 7.1 g/dL (ref 6.4–8.3)

## 2013-07-30 LAB — CBC WITH DIFFERENTIAL/PLATELET
Basophils Absolute: 0 10*3/uL (ref 0.0–0.1)
Eosinophils Absolute: 0.2 10*3/uL (ref 0.0–0.5)
HGB: 12 g/dL (ref 11.6–15.9)
MCV: 83.8 fL (ref 79.5–101.0)
MONO#: 0.7 10*3/uL (ref 0.1–0.9)
MONO%: 10.7 % (ref 0.0–14.0)
NEUT#: 3.9 10*3/uL (ref 1.5–6.5)
RBC: 4.29 10*6/uL (ref 3.70–5.45)
RDW: 14.8 % — ABNORMAL HIGH (ref 11.2–14.5)
WBC: 6.8 10*3/uL (ref 3.9–10.3)
lymph#: 1.9 10*3/uL (ref 0.9–3.3)

## 2013-07-30 NOTE — Progress Notes (Addendum)
[facility]  Telephone:(336) [phone] [phone]) [phone]  OFFICE PROGRESS NOTE  ID: [name]   DOB: Feb 05, 1941  MR#: [id number]  JYN#:[id number]   PCP: [name], MD SU: Cyndia Bent, MD SU: Wayland Denis, D.O.  DIAGNOSIS:  [name] is a 72 y.o. 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   PRIOR THERAPY: #1 Right breast needle core biopsy on 07/18/2010 which showed a grade 2 ductal carcinoma in situ with microcalcifications identified, estrogen receptor 100% positive progesterone receptor negative.  #1 Status post right and left breast simple mastectomies on 10/12/2010 for a stage I, pT1b pN0, 0.9 cm invasive ductal carcinoma of the right breast, grade 2 with high-grade ductal carcinoma in situ, surgical margins negative for carcinoma, estrogen receptor 94% positive, progesterone receptor negative, Ki-67 36%, HER-2/neu by CISH no amplification.  Patient opted for a prophylactic left breast mastectomy which showed fibrocystic changes without evidence of malignancy.  #3 Started antiestrogen therapy with Aromasin in 10/2010, but patient was unable to tolerate.  Antiestrogen therapy was changed to Arimidex in 12/2010 and the patient was unable to tolerate Arimidex either.  All antiestrogen therapy was discontinued as of 03/22/2012.   #4 Genetic testing in 11/2010 showed the patient was negative for BRCA1 and BRCA2 breast cancer gene mutations.  #5  Dr. Wayland Denis, plastic surgeon completed the patient's bilateral breast reconstruction.    CURRENT THERAPY: Observation   INTERVAL HISTORY: [name] is a 72 y.o. female who returns for followup today of right breast invasive ductal carcinoma.  She is accompanied for today's office visit by her husband [name].  Since her last office visit on 12/25/2012 the patient states  she developed a nonproductive cough in the last 2-3 days.  She also states she had a head cold that started around 07/15/2013 for which she's been taking Mucinex.  She states her head cold is clearing.  She has a history of IBS and nausea.  She states she had recent plantar fasciitis of her right lower extremity that has since resolved.  She also suffers from low libido.  She declined a pelvic therapy consultation for low libido.  Her interval history is otherwise unremarkable and stable.  MEDICAL HISTORY: Past Medical History  Diagnosis Date  . Diabetes mellitus   . IBS (irritable bowel syndrome)   . Breast cancer 2012  . GERD (gastroesophageal reflux disease)   . Hypertension   . Gilbert syndrome   . Plantar fasciitis of right foot     ALLERGIES:  Allergies  Allergen Reactions  . Sulfur     Flushed, funny feeling in throat   . Morphine And Related   . Ace Inhibitors     REACTION: COUGH  . Codeine     REACTION: VOMITTING    MEDICATIONS:  Current Outpatient Prescriptions  Medication Sig Dispense Refill  . acyclovir ointment (ZOVIRAX) 5 % Apply 1 application topically as needed.  30 g  1  . Calcium Carbonate-Vitamin D (CALCIUM + D) 600-200 MG-UNIT TABS Take 1 tablet by mouth 2 (two) times daily.       . Cholecalciferol (VITAMIN D3) 1000 UNITS CAPS Take 1 capsule by mouth 2 (two) times daily.       . cloNIDine (CATAPRES) 0.2 MG tablet TAKE ONE TABLET BY MOUTH TWICE DAILY  180 tablet  3  . losartan-hydrochlorothiazide (HYZAAR) 100-12.5 MG  per tablet TAKE 1 TABLET ONCE DAILY.  90 tablet  3  . metFORMIN (GLUCOPHAGE) 500 MG tablet Take 1 tablet (500 mg total) by mouth 2 (two) times daily with a meal.  180 tablet  3  . Multiple Vitamin (MULTIVITAMIN) capsule Take 1 capsule by mouth daily.        . nitrofurantoin (MACRODANTIN) 100 MG capsule Take 100 mg by mouth once as needed.      . Omega-3 Fatty Acids (FISH OIL CONCENTRATE) 1000 MG CAPS Take by mouth 2 (two) times daily.       Marland Kitchen  oxybutynin (OXYTROL) 3.9 MG/24HR Place 1 patch onto the skin 2 (two) times a week.        . pantoprazole (PROTONIX) 40 MG tablet TAKE 1 TABLET 30 MINUTES BEFORE A MEAL.  30 tablet  8  . Probiotic Product (ALIGN) 4 MG CAPS Take by mouth as needed.       . ranitidine (ZANTAC) 150 MG tablet Take 150 mg by mouth 2 (two) times daily.       No current facility-administered medications for this visit.    SURGICAL HISTORY:  Past Surgical History  Procedure Laterality Date  . Abdominal hysterectomy  1976    with BSO due to infection from [facility] IUD  . Breast lumpectomy      [id number], [phone]  . Mastectomy w/ nodes partial  2012    double mastectomy with nodes taken out on right side   . Colonoscopy w/ polypectomy  2006    negative 2011; Dr Leone Payor  . Appendectomy  1976    @ [facility] & BSO  . Tonsillectomy and adenoidectomy    . Breast enhancement surgery  04/2011    Dr Kelly Splinter, [facility]    REVIEW OF SYSTEMS:  Pertinent items are noted in HPI.  A 10 point review of systems was completed and is negative except as noted above.  [name] denies any other symptomatology including fatigue, fever or chills, headache, vision changes, swollen glands,  shortness of breath, chest pain or discomfort, nausea, vomiting, diarrhea, constipation, change in urinary or bowel habits, any other arthralgias/myalgias, unusual bleeding/bruising or any other symptomatology.    PHYSICAL EXAMINATION: Blood pressure 134/81, pulse 84, temperature 98.5 F (36.9 C), temperature source Oral, resp. rate 18, height 5\' 5"  (1.651 m), weight 179 lb 6.4 oz (81.375 kg).  ECOG FS: 1 - Symptomatic but completely ambulatory  General appearance: Alert, cooperative, well nourished, no apparent distress Head: Normocephalic, without obvious abnormality, atraumatic Eyes: Conjunctivae/corneas clear, PERRLA, EOMI Nose: Nares, septum and mucosa are normal, no drainage or sinus tenderness Neck: No adenopathy, supple,  symmetrical, trachea midline, no tenderness Resp: Clear to auscultation bilaterally, no wheezes/rales/rhonchi, nonproductive/dry cough Cardio: Regular rate and rhythm, S1, S2 normal, no murmur, click, rub or gallop, no edema Breasts:  Bilateral breast reconstruction with well-healed surgical scars, no lymphadenopathy, bilateral axillary fullness, slight breast asymmetry GI: Soft, distended, non-tender, hypoactive bowel sounds, excessive habitus Skin: No rashes/lesions, skin warm and dry, no erythematous areas, no cyanosis  M/S:  Atraumatic, normal strength in all extremities, normal range of motion, no clubbing  Lymph nodes: Cervical, supraclavicular, and axillary nodes normal Neurologic: Grossly normal, cranial nerves II through XII intact, alert and oriented x 3 Psych: Appropriate affect  LABORATORY DATA: Lab Results  Component Value Date   WBC 6.8 07/30/2013   HGB 12.0 07/30/2013   HCT 36.0 07/30/2013   MCV 83.8 07/30/2013   PLT 227 07/30/2013  Chemistry      Component Value Date/Time   NA 142 07/30/2013 1218   NA 139 08/14/2012 1023   K 4.0 07/30/2013 1218   K 3.7 08/14/2012 1023   CL 106 12/25/2012 0827   CL 103 08/14/2012 1023   CO2 27 07/30/2013 1218   CO2 28 08/14/2012 1023   BUN 15.2 07/30/2013 1218   BUN 17 08/14/2012 1023   CREATININE 1.0 07/30/2013 1218   CREATININE 1.1 08/14/2012 1023      Component Value Date/Time   CALCIUM 10.5* 07/30/2013 1218   CALCIUM 9.8 08/14/2012 1023   ALKPHOS 77 07/30/2013 1218   ALKPHOS 67 08/14/2012 1023   AST 44* 07/30/2013 1218   AST 65* 08/14/2012 1023   ALT 48 07/30/2013 1218   ALT 72* 08/14/2012 1023   BILITOT 0.82 07/30/2013 1218   BILITOT 1.0 08/14/2012 1023       RADIOGRAPHIC STUDIES: No results found.   ASSESSMENT: [name] is a BRCA negative 72 y.o. female with: Stage I invasive ductal carcinoma of the right breast measuring 0.9 cm, grade 2, estrogen receptor positive. Patient is status post right  mastectomy and a prophylactic left mastectomy followed by reconstruction .Patient began antiestrogen therapy with Aromasin postmastectomy, but she could not tolerate it due to aches pains and significant, incapacitating fatigue.  She was then switched to antiestrogen therapy with Arimidex 1 mg daily in 12/2010, but unfortunately she could not tolerate Arimidex either. She discontinued Arimidex in 03/2012.  Patient now wants just to be observed every 6 months.  No evidence of recurrent disease.  PLAN:  We plan to see [name] again in 6 months at which time we will check laboratories of CBC and CMP.    Larina Bras, NP-C 08/01/2013    8:39 PM      ATTENDING'S ATTESTATION:  I personally reviewed patient's chart, examined patient myself, formulated the treatment plan as followed.   Patient is doing remarkably well. She is currently not on any therapy. But we are continuing to see her every 6 months. Currently she has no evidence of recurrent disease.  Drue Second, MD Medical/Oncology [facility] [phone] (beeper) [phone] (Office)  08/18/2013, 3:29 AM

## 2013-07-30 NOTE — Patient Instructions (Addendum)
Please contact us at [phone] if you have any questions or concerns.  Please continue to do well and enjoy life!!!  Get plenty of rest, drink plenty of water, exercise daily (walking as tolerated), eat a balanced diet.  Continue to take calcium and vitamin D3 1000 IUs daily.   Complete monthly self-breast examinations.  Have a clinical breast exam by a physician every year.  Results for orders placed in visit on 07/30/13 (from the past 24 hour(s))  CBC WITH DIFFERENTIAL     Status: Abnormal   Collection Time    07/30/13 12:17 PM      Result Value Range   WBC 6.8  3.9 - 10.3 10e3/uL   NEUT# 3.9  1.5 - 6.5 10e3/uL   HGB 12.0  11.6 - 15.9 g/dL   HCT 40.9  81.1 - 91.4 %   Platelets 227  145 - 400 10e3/uL   MCV 83.8  79.5 - 101.0 fL   MCH 28.0  25.1 - 34.0 pg   MCHC 33.4  31.5 - 36.0 g/dL   RBC 7.82  9.56 - 2.13 10e6/uL   RDW 14.8 (*) 11.2 - 14.5 %   lymph# 1.9  0.9 - 3.3 10e3/uL   MONO# 0.7  0.1 - 0.9 10e3/uL   Eosinophils Absolute 0.2  0.0 - 0.5 10e3/uL   Basophils Absolute 0.0  0.0 - 0.1 10e3/uL   NEUT% 58.0  38.4 - 76.8 %   LYMPH% 28.5  14.0 - 49.7 %   MONO% 10.7  0.0 - 14.0 %   EOS% 2.2  0.0 - 7.0 %   BASO% 0.6  0.0 - 2.0 %   Narrative:    Performed At:  [facility]               [address]               [place], [place] [address]  COMPREHENSIVE METABOLIC PANEL (CC13)     Status: Abnormal   Collection Time    07/30/13 12:18 PM      Result Value Range   Sodium 142  136 - 145 mEq/L   Potassium 4.0  3.5 - 5.1 mEq/L   Chloride 105  98 - 109 mEq/L   CO2 27  22 - 29 mEq/L   Glucose 133  70 - 140 mg/dl   BUN 84.6  7.0 - 96.2 mg/dL   Creatinine 1.0  0.6 - 1.1 mg/dL   Total Bilirubin 9.52  0.20 - 1.20 mg/dL   Alkaline Phosphatase 77  40 - 150 U/L   AST 44 (*) 5 - 34 U/L   ALT 48  0 - 55 U/L   Total Protein 7.1  6.4 - 8.3 g/dL   Albumin 3.5  3.5 - 5.0 g/dL   Calcium 84.1 (*) 8.4 - 10.4 mg/dL   Anion Gap 10  3 - 11 mEq/L   Narrative:    Performed At:   [facility]               [address]               [place], [place] [address]

## 2013-08-18 ENCOUNTER — Ambulatory Visit (INDEPENDENT_AMBULATORY_CARE_PROVIDER_SITE_OTHER): Payer: Medicare Other | Admitting: Internal Medicine

## 2013-08-18 ENCOUNTER — Encounter: Payer: Self-pay | Admitting: Internal Medicine

## 2013-08-18 VITALS — BP 183/84 | HR 98 | Temp 98.6°F | Ht 64.5 in | Wt 178.4 lb

## 2013-08-18 DIAGNOSIS — I10 Essential (primary) hypertension: Secondary | ICD-10-CM

## 2013-08-18 DIAGNOSIS — Z9289 Personal history of other medical treatment: Secondary | ICD-10-CM | POA: Insufficient documentation

## 2013-08-18 DIAGNOSIS — E785 Hyperlipidemia, unspecified: Secondary | ICD-10-CM

## 2013-08-18 DIAGNOSIS — N959 Unspecified menopausal and perimenopausal disorder: Secondary | ICD-10-CM

## 2013-08-18 DIAGNOSIS — T887XXA Unspecified adverse effect of drug or medicament, initial encounter: Secondary | ICD-10-CM

## 2013-08-18 DIAGNOSIS — E119 Type 2 diabetes mellitus without complications: Secondary | ICD-10-CM

## 2013-08-18 DIAGNOSIS — Z23 Encounter for immunization: Secondary | ICD-10-CM

## 2013-08-18 DIAGNOSIS — Z0000 Encounter for general adult medical examination without abnormal findings: Secondary | ICD-10-CM

## 2013-08-18 DIAGNOSIS — Z9189 Other specified personal risk factors, not elsewhere classified: Secondary | ICD-10-CM

## 2013-08-18 DIAGNOSIS — K589 Irritable bowel syndrome without diarrhea: Secondary | ICD-10-CM | POA: Insufficient documentation

## 2013-08-18 LAB — LIPID PANEL
Total CHOL/HDL Ratio: 6
VLDL: 36.8 mg/dL (ref 0.0–40.0)

## 2013-08-18 LAB — TSH: TSH: 0.99 u[IU]/mL (ref 0.35–5.50)

## 2013-08-18 LAB — HEMOGLOBIN A1C: Hgb A1c MFr Bld: 7 % — ABNORMAL HIGH (ref 4.6–6.5)

## 2013-08-18 LAB — MICROALBUMIN / CREATININE URINE RATIO: Microalb Creat Ratio: 0.2 mg/g (ref 0.0–30.0)

## 2013-08-18 MED ORDER — ONDANSETRON HCL 4 MG PO TABS: 4.0000 mg | ORAL_TABLET | Freq: Three times a day (TID) | ORAL | Status: DC | PRN

## 2013-08-18 MED ORDER — PANTOPRAZOLE SODIUM 40 MG PO TBEC: DELAYED_RELEASE_TABLET | ORAL | Status: DC

## 2013-08-18 MED ORDER — CLONIDINE HCL 0.2 MG PO TABS: ORAL_TABLET | ORAL | Status: DC

## 2013-08-18 MED ORDER — METFORMIN HCL 500 MG PO TABS: 500.0000 mg | ORAL_TABLET | Freq: Two times a day (BID) | ORAL | Status: DC

## 2013-08-18 MED ORDER — LOSARTAN POTASSIUM-HCTZ 100-12.5 MG PO TABS: ORAL_TABLET | ORAL | Status: DC

## 2013-08-18 NOTE — Progress Notes (Signed)
Subjective:    Patient ID: [name], female    DOB: December 22, 1940, 72 y.o.   MRN: [id number]  HPI Medicare Wellness Visit: Psychosocial and medical history were reviewed as required by Medicare (history related to abuse, antisocial behavior , firearm risk). Social history: Caffeine: 64 oz green tea  , Alcohol:very rarely  , Tobacco WUJ:[id number] Exercise:walking 25-30 min daily Personal safety/fall risk:no Limitations of activities of daily living:no Seatbelt/ smoke alarm use:yes Healthcare Power of Attorney/Living Will status: in place Ophthalmologic exam status:current Hearing evaluation status: not current Orientation: Oriented X 3 Memory and recall: good Spelling  testing: good Depression/anxiety assessment: no Foreign travel history:never Immunization status for influenza/pneumonia/ shingles /tetanus: Transfusion history: 12 with TAH & BSO Preventive health care maintenance status: Colonoscopy/BMD/mammogram/Pap as per protocol/standard care:current except BMD Dental care: every 6 mos Chart reviewed and updated. Active issues reviewed and addressed as documented below.    Review of Systems Blood pressure  average low 130s/mid 70s.See BP.NOTE : meds NOT taken today ! Compliant with anti hypertemsive medication. No lightheadedness or other adverse medication effect described.  Significant headaches, epistaxis, chest pain, palpitations, exertional dyspnea, claudication, paroxysmal nocturnal dyspnea, or edema absent.     Objective:   Physical Exam Gen.: Healthy and well-nourished in appearance. Alert, appropriate and cooperative throughout exam.Appears younger than stated age  Head: Normocephalic without obvious abnormalities  Eyes: No corneal or conjunctival inflammation noted. Pupils equal round reactive to light and accommodation. Extraocular motion intact.  Ears: External  ear exam reveals no significant lesions or deformities. Canals clear .TMs normal. Hearing is  grossly normal bilaterally. Nose: External nasal exam reveals no deformity or inflammation. Nasal mucosa are pink and moist. No lesions or exudates noted.   Mouth: Oral mucosa and oropharynx reveal no lesions or exudates. Teeth in good repair. Neck: No deformities, masses, or tenderness noted. Range of motion & Thyroid normal. Lungs: Normal respiratory effort; chest expands symmetrically. Lungs are clear to auscultation without rales, wheezes, or increased work of breathing. Heart: Normal rate and rhythm. Normal S1 and S2. No gallop, click, or rub. S4 with slurring . Abdomen: Bowel sounds normal; abdomen soft and nontender. No masses, organomegaly or hernias noted. Genitalia:  as per Gyn                                  Musculoskeletal/extremities: No deformity or scoliosis noted of  the thoracic or lumbar spine.  No clubbing, cyanosis, edema, or significant extremity  deformity noted. Range of motion normal .Tone & strength normal. Hand joints normal . Fingernail  health good. Able to lie down & sit up w/o help. Negative SLR bilaterally Vascular: Carotid, radial artery, dorsalis pedis and  posterior tibial pulses are full and equal. No bruits present. Neurologic: Alert and oriented x3. Deep tendon reflexes symmetrical and normal.  Gait normal        Skin: Intact without suspicious lesions or rashes. Lymph: No cervical, axillary lymphadenopathy present. Psych: Mood and affect are normal. Normally interactive  Assessment & Plan:  #1 Medicare Wellness Exam; criteria met ; data entered #2 Problem List/Diagnoses reviewed Plan:  Assessments made/ Orders entered  

## 2013-08-18 NOTE — Patient Instructions (Signed)
Your next office appointment will be determined based upon review of your pending labs & BMD. Those instructions will be transmitted to you through My Chart  . Minimal Blood Pressure Goal= AVERAGE < 140/90;  Ideal is an AVERAGE < 135/85. This AVERAGE should be calculated from @ least 5-7 BP readings taken @ different times of day on different days of week. You should not respond to isolated BP readings , but rather the AVERAGE for that week .Please bring your  blood pressure cuff to office visits to verify that it is reliable.It  can also be checked against the blood pressure device at the pharmacy. Finger or wrist cuffs are not dependable; an arm cuff is. 

## 2013-08-18 NOTE — Progress Notes (Signed)
Pre visit review using our clinic review tool, if applicable. No additional management support is needed unless otherwise documented below in the visit note. 

## 2013-08-19 ENCOUNTER — Encounter: Payer: Self-pay | Admitting: *Deleted

## 2013-08-19 NOTE — Addendum Note (Signed)
Addended by: Silvio Pate D on: 08/19/2013 09:14 AM   Modules accepted: Orders

## 2013-08-20 ENCOUNTER — Encounter: Payer: Self-pay | Admitting: *Deleted

## 2013-08-20 LAB — HEPATITIS C ANTIBODY: HCV Ab: NEGATIVE

## 2013-08-31 ENCOUNTER — Encounter: Payer: Self-pay | Admitting: Internal Medicine

## 2013-08-31 DIAGNOSIS — M8580 Other specified disorders of bone density and structure, unspecified site: Secondary | ICD-10-CM | POA: Insufficient documentation

## 2013-08-31 HISTORY — DX: Other specified disorders of bone density and structure, unspecified site: M85.80

## 2013-12-18 ENCOUNTER — Encounter: Payer: Self-pay | Admitting: Internal Medicine

## 2014-01-26 ENCOUNTER — Other Ambulatory Visit: Payer: Self-pay | Admitting: *Deleted

## 2014-01-26 DIAGNOSIS — Z853 Personal history of malignant neoplasm of breast: Secondary | ICD-10-CM

## 2014-01-27 ENCOUNTER — Encounter: Payer: Self-pay | Admitting: Adult Health

## 2014-01-27 ENCOUNTER — Ambulatory Visit (HOSPITAL_BASED_OUTPATIENT_CLINIC_OR_DEPARTMENT_OTHER): Payer: Medicare Other | Admitting: Adult Health

## 2014-01-27 ENCOUNTER — Other Ambulatory Visit (HOSPITAL_BASED_OUTPATIENT_CLINIC_OR_DEPARTMENT_OTHER): Payer: Medicare Other

## 2014-01-27 VITALS — BP 138/80 | HR 80 | Temp 98.2°F | Resp 18 | Ht 64.5 in | Wt 179.6 lb

## 2014-01-27 DIAGNOSIS — Z853 Personal history of malignant neoplasm of breast: Secondary | ICD-10-CM

## 2014-01-27 LAB — CBC WITH DIFFERENTIAL/PLATELET
BASO%: 0.6 % (ref 0.0–2.0)
Basophils Absolute: 0 10*3/uL (ref 0.0–0.1)
EOS%: 3.2 % (ref 0.0–7.0)
Eosinophils Absolute: 0.2 10*3/uL (ref 0.0–0.5)
HEMATOCRIT: 37 % (ref 34.8–46.6)
HGB: 12.3 g/dL (ref 11.6–15.9)
LYMPH#: 1.6 10*3/uL (ref 0.9–3.3)
LYMPH%: 30.5 % (ref 14.0–49.7)
MCH: 28.8 pg (ref 25.1–34.0)
MCHC: 33.4 g/dL (ref 31.5–36.0)
MCV: 86.2 fL (ref 79.5–101.0)
MONO#: 0.5 10*3/uL (ref 0.1–0.9)
MONO%: 10.1 % (ref 0.0–14.0)
NEUT#: 2.9 10*3/uL (ref 1.5–6.5)
NEUT%: 55.6 % (ref 38.4–76.8)
Platelets: 214 10*3/uL (ref 145–400)
RBC: 4.29 10*6/uL (ref 3.70–5.45)
RDW: 14.4 % (ref 11.2–14.5)
WBC: 5.2 10*3/uL (ref 3.9–10.3)

## 2014-01-27 LAB — COMPREHENSIVE METABOLIC PANEL (CC13)
ALT: 45 U/L (ref 0–55)
AST: 31 U/L (ref 5–34)
Albumin: 3.6 g/dL (ref 3.5–5.0)
Alkaline Phosphatase: 63 U/L (ref 40–150)
Anion Gap: 12 mEq/L — ABNORMAL HIGH (ref 3–11)
BUN: 13.1 mg/dL (ref 7.0–26.0)
CALCIUM: 10.3 mg/dL (ref 8.4–10.4)
CHLORIDE: 106 meq/L (ref 98–109)
CO2: 26 mEq/L (ref 22–29)
Creatinine: 1.1 mg/dL (ref 0.6–1.1)
Glucose: 146 mg/dl — ABNORMAL HIGH (ref 70–140)
Potassium: 3.6 mEq/L (ref 3.5–5.1)
Sodium: 144 mEq/L (ref 136–145)
Total Bilirubin: 0.97 mg/dL (ref 0.20–1.20)
Total Protein: 6.9 g/dL (ref 6.4–8.3)

## 2014-01-27 NOTE — Progress Notes (Signed)
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    BREAST CANCER HISTORY: Patient went to routine mammogram in 2011 that demonstrated a right breast mass.  She did undergo a biopsy that demonstrated grade 2 DCIS ER positive, PR negative.  A MRI of the breast was performed and that demonstrated an additional 2.8cm nodular enhancement in the central portion of the right breast.  The patient then decided to undergo bilateral mastecomies after receiving the MRI results.    CURRENT THERAPY: observation  INTERVAL HISTORY:  [name] is here for follow up and evaluation of her h/o right breast cancer.  She is doing well today.  She denies any recent fevers, chills, new pain, night sweats, unintentenional weight loss, or any further concerns.  She does perform breast exams and we reviewed her health maintenance below.    REVIEW OF SYSTEMS: A 10 point review of systems was conducted and is otherwise negative except for what is noted above.     PAST MEDICAL HISTORY: Past Medical History  Diagnosis Date  . Diabetes mellitus   . IBS (irritable bowel syndrome)   . Breast cancer 2012  . GERD (gastroesophageal reflux disease)   . Hypertension   . Gilbert syndrome   . Plantar fasciitis of right foot     Dr Oneta Rack  . Transfusion history 1976    PAST SURGICAL HISTORY: Past Surgical History  Procedure Laterality Date  . Abdominal hysterectomy  1976    with BSO due to infection from [facility] IUD  . Breast lumpectomy      [id number], [phone]  . Mastectomy w/ nodes partial  2012    double mastectomy with nodes taken out on right side   . Colonoscopy w/ polypectomy  2006    negative 2011; Dr Carlean Purl  . Appendectomy  1976    @  TAH & BSO  . Tonsillectomy and adenoidectomy    . Placement of breast implants  04/2011    Dr Migdalia Dk, [facility]    FAMILY HISTORY Family History  Problem Relation Age of Onset  . Heart attack Father 17  . Breast cancer Maternal Aunt      2 M aunts (40, 63)  . Breast cancer Sister 89     bilateral   . Heart failure Sister     PMH intensive chemotherapy  . Hypertension Sister   . Stroke Sister 30  . Diabetes Neg Hx     GYNECOLOGIC HISTORY: Menarche at age 89, G75 P2, s/p TAH, BSO in 1976, was on HRT for 28 years  SOCIAL HISTORY: Married to [employer], for 22 years.  Live in a one story home.  The patient and her husband are both retired. They enjoy traveling in their camper and kayaking, boating in their spare time.     ADVANCED DIRECTIVES: In place.  Recommended patient bring in copies.    HEALTH MAINTENANCE: History  Substance Use Topics  . Smoking status: Former Smoker    Quit date: 09/18/1977  . Smokeless tobacco: Never Used     Comment: smoked 1957-1979 , up to 1 ppd  . Alcohol Use: No     Comment:  very rarely      Mammogram: s/p bilateral mastectomy Colonoscopy:  06/23/2010 with 10 year f/u recommended Bone Density Scan: 08/22/2013 Pap Smear: s/p TAH/BSO Eye Exam: patient unsure Vitamin D Level:  Last checked in 2012 Lipid Panel: 08/18/2013   Allergies  Allergen Reactions  . Sulfur     Flushed, funny feeling in throat   . Morphine And Related   . Ace Inhibitors     REACTION: COUGH  . Codeine     REACTION: VOMITTING    Current Outpatient Prescriptions  Medication Sig Dispense Refill  . Calcium Carbonate-Vitamin D (CALCIUM + D) 600-200 MG-UNIT TABS Take 1 tablet by mouth 2 (two) times daily.       . Cholecalciferol (VITAMIN D3) 1000 UNITS CAPS Take 1 capsule by mouth 2 (two) times daily.       . cloNIDine (CATAPRES) 0.2 MG tablet TAKE ONE TABLET BY MOUTH TWICE DAILY  180 tablet  3  . diphenhydrAMINE (BENADRYL) 25 MG tablet Take 12.5 mg by mouth at bedtime  as needed.       Marland Kitchen losartan-hydrochlorothiazide (HYZAAR) 100-12.5 MG per tablet TAKE 1 TABLET ONCE DAILY.  90 tablet  3  . metFORMIN (GLUCOPHAGE) 500 MG tablet Take 1 tablet (500 mg total) by mouth 2 (two) times daily with a meal.  180 tablet  3  . Multiple Vitamin (MULTIVITAMIN) capsule Take 1 capsule by mouth daily.        . Omega-3 Fatty Acids (FISH OIL CONCENTRATE) 1000 MG CAPS Take by mouth 2 (two) times daily.       Marland Kitchen oxybutynin (OXYTROL) 3.9 MG/24HR Place 1 patch onto the skin 2 (two) times a week.        . pantoprazole (PROTONIX) 40 MG tablet TAKE 1 TABLET 30 MINUTES BEFORE A MEAL.  30 tablet  8  . Probiotic Product (ALIGN) 4 MG CAPS Take by mouth as needed.       . ranitidine (ZANTAC) 150 MG tablet Take 150 mg by mouth 2 (two) times daily.      . ondansetron (ZOFRAN) 4 MG tablet Take 1 tablet (4 mg total) by mouth every 8 (eight) hours as needed for nausea or vomiting.  10 tablet  0   No current facility-administered medications for this visit.    OBJECTIVE: Filed Vitals:   01/27/14 1028  BP: 138/80  Pulse: 80  Temp: 98.2 F (36.8 C)  Resp: 18     Body mass index is 30.36 kg/(m^2).      GENERAL: Patient is a well appearing female in no acute distress HEENT:  Sclerae anicteric.  Oropharynx clear and moist. No ulcerations or evidence of oropharyngeal candidiasis. Neck is supple.  NODES:  No cervical, supraclavicular, or axillary lymphadenopathy palpated.  BREAST EXAM:  S/p bilateral reconstruction, no masses, or sign of recurrence.  Benign bilateral breast exam.   LUNGS:  Clear to auscultation bilaterally.  No wheezes or rhonchi. HEART:  Regular rate and rhythm. No murmur appreciated. ABDOMEN:  Soft, nontender.  Positive, normoactive bowel sounds. No organomegaly palpated. MSK:  No focal spinal tenderness to palpation. Full range of motion bilaterally in the upper extremities. EXTREMITIES:  No peripheral edema.   SKIN:  Clear with no obvious rashes or skin changes. No nail  dyscrasia. NEURO:  Nonfocal. Well oriented.  Appropriate affect. ECOG FS:0 - Asymptomatic  LAB RESULTS:  CMP     Component Value Date/Time   NA 144 01/27/2014 1011   NA 139 08/14/2012 1023   K 3.6 01/27/2014 1011   K 3.7 08/14/2012 1023   CL 106  12/25/2012 0827   CL 103 08/14/2012 1023   CO2 26 01/27/2014 1011   CO2 28 08/14/2012 1023   GLUCOSE 146* 01/27/2014 1011   GLUCOSE 173* 12/25/2012 0827   GLUCOSE 140* 08/14/2012 1023   BUN 13.1 01/27/2014 1011   BUN 17 08/14/2012 1023   CREATININE 1.1 01/27/2014 1011   CREATININE 1.1 08/14/2012 1023   CALCIUM 10.3 01/27/2014 1011   CALCIUM 9.8 08/14/2012 1023   PROT 6.9 01/27/2014 1011   PROT 7.4 08/14/2012 1023   ALBUMIN 3.6 01/27/2014 1011   ALBUMIN 3.8 08/14/2012 1023   AST 31 01/27/2014 1011   AST 65* 08/14/2012 1023   ALT 45 01/27/2014 1011   ALT 72* 08/14/2012 1023   ALKPHOS 63 01/27/2014 1011   ALKPHOS 67 08/14/2012 1023   BILITOT 0.97 01/27/2014 1011   BILITOT 1.0 08/14/2012 1023   GFRNONAA >60 05/16/2011 1445   GFRAA >60 05/16/2011 1445    I No results found for this basename: SPEP,  UPEP,   kappa and lambda light chains    Lab Results  Component Value Date   WBC 5.2 01/27/2014   NEUTROABS 2.9 01/27/2014   HGB 12.3 01/27/2014   HCT 37.0 01/27/2014   MCV 86.2 01/27/2014   PLT 214 01/27/2014      Chemistry      Component Value Date/Time   NA 144 01/27/2014 1011   NA 139 08/14/2012 1023   K 3.6 01/27/2014 1011   K 3.7 08/14/2012 1023   CL 106 12/25/2012 0827   CL 103 08/14/2012 1023   CO2 26 01/27/2014 1011   CO2 28 08/14/2012 1023   BUN 13.1 01/27/2014 1011   BUN 17 08/14/2012 1023   CREATININE 1.1 01/27/2014 1011   CREATININE 1.1 08/14/2012 1023      Component Value Date/Time   CALCIUM 10.3 01/27/2014 1011   CALCIUM 9.8 08/14/2012 1023   ALKPHOS 63 01/27/2014 1011   ALKPHOS 67 08/14/2012 1023   AST 31 01/27/2014 1011   AST 65* 08/14/2012 1023   ALT 45 01/27/2014 1011   ALT 72* 08/14/2012 1023   BILITOT 0.97 01/27/2014 1011    BILITOT 1.0 08/14/2012 1023       No results found for this basename: LABCA2    No components found with this basename: LABCA125    No results found for this basename: INR,  in the last 168 hours  Urinalysis    Component Value Date/Time   COLORURINE yellow 11/17/2010 1626   APPEARANCEUR Clear 11/17/2010 1626   LABSPEC 1.025 11/17/2010 1626   PHURINE 5.0 11/17/2010 1626   HGBUR negative 11/17/2010 1626   HGBUR NEGATIVE 10/11/2010 1013   BILIRUBINUR negative 11/17/2010 1626   KETONESUR NEGATIVE 10/11/2010 1013   PROTEINUR NEGATIVE 10/11/2010 1013   UROBILINOGEN negative 11/17/2010 1626   NITRITE negative 11/17/2010 1626   LEUKOCYTESUR NEGATIVE MICROSCOPIC NOT DONE ON URINES WITH NEGATIVE PROTEIN, BLOOD, LEUKOCYTES, NITRITE, OR GLUCOSE <1000 mg/dL. 10/11/2010 1013    STUDIES: No results found.  ASSESSMENT: 73 y.o. 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    1 Status post right and left breast simple mastectomies on 10/12/2010 for a stage I, pT1b pN0, 0.9 cm invasive ductal carcinoma of the right breast, grade 2 with high-grade ductal carcinoma in situ, surgical margins negative for carcinoma, estrogen receptor 94% positive, progesterone receptor negative, Ki-67 36%, HER-2/neu by CISH no amplification. Patient  opted for a prophylactic left breast mastectomy which showed fibrocystic changes without evidence of malignancy.   2. Started antiestrogen therapy with Aromasin in 10/2010, but patient was unable to tolerate antiestrogen therapy was changed to Arimidex in 12/2010 and the patient was unable to tolerate Arimidex either. All antiestrogen therapy was discontinued as of 03/22/2012.   3. Genetic testing in 11/2010 showed the patient was negative for BRCA1 and BRCA2 breast cancer gene mutations.   4. Dr. Theodoro Kos, plastic surgeon completed the patient's bilateral breast  reconstruction.    PLAN:  Mrs. Giammona is doing well today.  She has no sign of breast cancer recurrence.  We did review her health maintenance.  We discussed survivorship in detail.  I recommended she stay up to date with her mammograms.  I recommended she continue eating a healthy diet, exercising most days of the week and continuing her breast exams.    The patient will return in 6 months for labs and evaluation.   She knows to call us in the interim for any questions or concerns.  We can certainly see her sooner if needed.  I spent 25 minutes counseling the patient face to face.  The total time spent in the appointment was 30 minutes.  [name], [place] [phone] 01/28/2014 11:28 PM

## 2014-01-27 NOTE — Patient Instructions (Signed)
You are doing well.  You have no sign of recurrence.  Continue with a healthy diet, exercise, and chest wall exams.  Please call us if you have any questions or concerns.    We will see you back in 6 months.

## 2014-02-20 ENCOUNTER — Other Ambulatory Visit: Payer: Self-pay | Admitting: Internal Medicine

## 2014-02-20 ENCOUNTER — Telehealth: Payer: Self-pay | Admitting: Internal Medicine

## 2014-02-20 DIAGNOSIS — E119 Type 2 diabetes mellitus without complications: Secondary | ICD-10-CM

## 2014-02-20 NOTE — Telephone Encounter (Signed)
Done

## 2014-02-20 NOTE — Telephone Encounter (Signed)
Patient wants to have her A1c checked on Wednesday. She has to have it checked every 4 months. Needs lab order in system.  Patient also wants to have Tetanus injection next week. Please advise if okay.

## 2014-02-20 NOTE — Telephone Encounter (Signed)
Okay for tetanus?

## 2014-02-20 NOTE — Telephone Encounter (Signed)
If due you don't need to ask me. [employer]

## 2014-02-25 ENCOUNTER — Ambulatory Visit: Payer: Medicare Other

## 2014-02-26 ENCOUNTER — Encounter: Payer: Self-pay | Admitting: Internal Medicine

## 2014-02-26 ENCOUNTER — Ambulatory Visit (INDEPENDENT_AMBULATORY_CARE_PROVIDER_SITE_OTHER): Payer: Medicare Other | Admitting: Internal Medicine

## 2014-02-26 ENCOUNTER — Other Ambulatory Visit (INDEPENDENT_AMBULATORY_CARE_PROVIDER_SITE_OTHER): Payer: Medicare Other

## 2014-02-26 VITALS — BP 132/78 | HR 89 | Temp 98.1°F | Wt 176.6 lb

## 2014-02-26 DIAGNOSIS — M899 Disorder of bone, unspecified: Secondary | ICD-10-CM

## 2014-02-26 DIAGNOSIS — IMO0001 Reserved for inherently not codable concepts without codable children: Secondary | ICD-10-CM | POA: Insufficient documentation

## 2014-02-26 DIAGNOSIS — E119 Type 2 diabetes mellitus without complications: Secondary | ICD-10-CM

## 2014-02-26 DIAGNOSIS — Z23 Encounter for immunization: Secondary | ICD-10-CM

## 2014-02-26 DIAGNOSIS — M8580 Other specified disorders of bone density and structure, unspecified site: Secondary | ICD-10-CM

## 2014-02-26 DIAGNOSIS — M949 Disorder of cartilage, unspecified: Secondary | ICD-10-CM

## 2014-02-26 LAB — MICROALBUMIN / CREATININE URINE RATIO
CREATININE, U: 100 mg/dL
MICROALB/CREAT RATIO: 1.4 mg/g (ref 0.0–30.0)
Microalb, Ur: 1.4 mg/dL (ref 0.0–1.9)

## 2014-02-26 LAB — SEDIMENTATION RATE: SED RATE: 39 mm/h — AB (ref 0–22)

## 2014-02-26 LAB — MAGNESIUM: Magnesium: 1.6 mg/dL (ref 1.5–2.5)

## 2014-02-26 LAB — HEMOGLOBIN A1C: Hgb A1c MFr Bld: 7 % — ABNORMAL HIGH (ref 4.6–6.5)

## 2014-02-26 LAB — CK: CK TOTAL: 81 U/L (ref 7–177)

## 2014-02-26 NOTE — Assessment & Plan Note (Addendum)
CK Vitamin D level Sed rate

## 2014-02-26 NOTE — Assessment & Plan Note (Signed)
A1c & urine microalbumin  

## 2014-02-26 NOTE — Patient Instructions (Signed)
Your next office appointment will be determined based upon review of your pending labs. Those instructions will be transmitted to you through My Chart . 

## 2014-02-26 NOTE — Progress Notes (Signed)
Subjective:    Patient ID: [name], female    DOB: [birth year], 73 y.o.   MRN: [id number]  HPI Symptoms began 3-4 weeks ago as pain in hands & upper arms bilaterally and entire legs bilaterally. There is no associated pain in the feet, forearms, or shoulder areas.  Onset was gradual and not associated with any specific trigger or injury  Pain is described as a dull and crampy-like discomfort ,4-6 on a 10 scale.  It is intermittent and lasts only seconds  It is aggravated by any activity especially walking and changing position from sitting to standing.  There is associated generalized weakness & fatrigue .The discomfort is severe enough that she must sit down.  Ice and heat have provided minimal relief.  She has not taken acetaminophen or nonsteroidals.  Review of the chart indicates her random glucose was 146 01/27/14.Ca++ & K+ were normal.  Her last A1c was 7% on 08/18/13. FBS < 115 @ home. At that time her TSH was 0.99.   The last vitamin D on record was 61 on 01/03/11. Not on statin.  She's being treated by an orthopedist for possible Baker's cyst in the left knee.    Review of Systems  She has no fever, chills, or unexplained weight loss  She has no dyspnea.  There is no chest pain or pedal edema  She has no headaches or limb numbness/weakness/tingling. No rash or temp/color skin changes.     Objective:   Physical Exam  Gen.: Healthy and well-nourished in appearance. Alert, appropriate and cooperative throughout exam. Appears younger than stated age  Head: Normocephalic without obvious abnormalities  Eyes: No corneal or conjunctival inflammation noted. Pupils equal round reactive to light and accommodation. Extraocular motion intact.  Ears: External  ear exam reveals no significant lesions or deformities.   Nose: External nasal exam reveals no deformity or inflammation. Nasal mucosa are pink and moist. No lesions or exudates noted.   Mouth: Oral mucosa  and oropharynx reveal no lesions or exudates. Teeth in good repair. Neck: No deformities, masses, or tenderness noted. Range of motion & Thyroid normal. Lungs: Normal respiratory effort; chest expands symmetrically. Lungs are clear to auscultation without rales, wheezes, or increased work of breathing. Heart: Normal rate and rhythm. Normal S1 and S2. No gallop, click, or rub. S4 w/o murmur. Abdomen: Protuberant. Bowel sounds normal; abdomen soft and nontender. No masses, organomegaly or hernias noted.                             Musculoskeletal/extremities:Slightly accentuated curvature of upper thoracic spine.  No clubbing, cyanosis, edema, or significant extremity  deformity noted. Range of motion normal .Tone & strength normal. Hand joints normal  Fingernail & toenail health good. Able to lie down & sit up w/o help. Negative SLR bilaterally Vascular: Carotid, radial artery, dorsalis pedis and  posterior tibial pulses are full and equal. No bruits present. Neurologic: Alert and oriented x3. Deep tendon reflexes symmetrical and normal. Neg Tinel's. Light touch normal over feet. Gait normal .       Skin: Intact without suspicious lesions or rashes. Lymph: No cervical, axillary lymphadenopathy present. Psych: Mood and affect are normal. Normally interactive  Assessment & Plan:  See Current Assessment & Plan in Problem List under specific Diagnosis

## 2014-02-26 NOTE — Progress Notes (Signed)
   Subjective:    Patient ID: [name], female    DOB: 1940-09-28, 73 y.o.   MRN: [id number]  HPI Symptoms began 3-4 weeks ago, approximately 01/26/14. Pt reports pain in her upper arms bilaterally, hands bilaterally, and legs bilaterally. She denies pain in her feet, forearms, and shoulders. The pain began gradually and is not attributed to any injury. The pain is described as dull and cramp-like and is rated at a 4-6/10. The pain occurs intermittently and lasts for seconds. The pain seems to occur generally with any sort of activity and specifically when walking or moving from a sitting to standing position. When the pain occurs the pt does not note any weakness, but states the pain is uncomfortable enough that she has to sit down. The pt has tried ice and heat for the discomfort and gotten mild relief. Denies taking tylenol or NSAIDs for relief. Of significance the pt states she has felt very fatigued the past month. She has not wanted to walk or perform daily activities.   The pt reports joint stiffness in her L knee and states she has a history of a "sore knee" in her R knee for which she had PT in the past. She now is having some pain behind her L knee that she believes is a baker's cyst, as her son has had one in the past. She is seeing her orthopedic doctor this month.    Review of Systems  Constitutional: Negative for fever, chills and unexpected weight change.  Respiratory: Negative for shortness of breath.   Cardiovascular: Negative for chest pain and leg swelling.  Neurological: Negative for dizziness, weakness, numbness and headaches.       No tingling in extremities       Objective:   Physical Exam Gen.: Healthy and well-nourished in appearance. Alert, appropriate and cooperative throughout exam. Appears younger than stated age  Eyes: No corneal or conjunctival inflammation noted. Pupils equal round reactive to light and accommodation. Extraocular motion intact.  Mouth:  Oral mucosa and oropharynx reveal no lesions or exudates. Teeth in good repair. Mucosa is moist.  Neck: No thyromegaly Lungs: Normal respiratory effort. Lungs are clear to auscultation without rales, wheezes, or increased work of breathing. Heart: Normal rate and rhythm. Normal S1 and S2. No gallop, click, or rub. No murmur.                          Musculoskeletal/extremities: No edema. Range of motion normal .Tone & strength normal. Hand joints normal Vascular: Carotid, radial artery, dorsalis pedis pulses are full and equal.  Lymph: No cervical, axillary lymphadenopathy present.                                                                                   Assessment & Plan:  #1 pain in extremities; may be related to electrolyte imbalances or inflammatory disorder.   Will check BMP, sed rate. Have pt f/u with orthopedic concerning knee stiffness as planned.   Check myoglobinuria? Check CK?

## 2014-02-26 NOTE — Assessment & Plan Note (Signed)
Vitamin D level 

## 2014-02-26 NOTE — Progress Notes (Signed)
Pre visit review using our clinic review tool, if applicable. No additional management support is needed unless otherwise documented below in the visit note. 

## 2014-03-02 LAB — VITAMIN D 1,25 DIHYDROXY
Vitamin D 1, 25 (OH)2 Total: 33 pg/mL (ref 18–72)
Vitamin D2 1, 25 (OH)2: <8 pg/mL
Vitamin D3 1, 25 (OH)2: 33 pg/mL

## 2014-05-08 ENCOUNTER — Encounter: Payer: Self-pay | Admitting: Internal Medicine

## 2014-05-28 ENCOUNTER — Ambulatory Visit (INDEPENDENT_AMBULATORY_CARE_PROVIDER_SITE_OTHER): Payer: Medicare Other

## 2014-05-28 DIAGNOSIS — Z23 Encounter for immunization: Secondary | ICD-10-CM

## 2014-05-30 ENCOUNTER — Encounter: Payer: Self-pay | Admitting: Internal Medicine

## 2014-06-04 ENCOUNTER — Telehealth: Payer: Self-pay

## 2014-06-04 NOTE — Telephone Encounter (Signed)
Let pt know she is due for follow up in November,  Pt voiced understanding.  POF sent

## 2014-06-05 ENCOUNTER — Telehealth: Payer: Self-pay | Admitting: Hematology and Oncology

## 2014-06-05 NOTE — Telephone Encounter (Signed)
, °

## 2014-08-06 ENCOUNTER — Other Ambulatory Visit: Payer: Self-pay | Admitting: Internal Medicine

## 2014-08-06 ENCOUNTER — Ambulatory Visit (INDEPENDENT_AMBULATORY_CARE_PROVIDER_SITE_OTHER): Payer: Medicare Other | Admitting: Internal Medicine

## 2014-08-06 ENCOUNTER — Encounter: Payer: Self-pay | Admitting: Internal Medicine

## 2014-08-06 ENCOUNTER — Other Ambulatory Visit (INDEPENDENT_AMBULATORY_CARE_PROVIDER_SITE_OTHER): Payer: Medicare Other

## 2014-08-06 VITALS — BP 140/78 | HR 75 | Temp 98.1°F | Resp 14 | Ht 64.5 in | Wt 172.0 lb

## 2014-08-06 DIAGNOSIS — E785 Hyperlipidemia, unspecified: Secondary | ICD-10-CM

## 2014-08-06 DIAGNOSIS — I10 Essential (primary) hypertension: Secondary | ICD-10-CM

## 2014-08-06 DIAGNOSIS — Z86010 Personal history of colonic polyps: Secondary | ICD-10-CM

## 2014-08-06 DIAGNOSIS — E119 Type 2 diabetes mellitus without complications: Secondary | ICD-10-CM

## 2014-08-06 DIAGNOSIS — Z9289 Personal history of other medical treatment: Secondary | ICD-10-CM

## 2014-08-06 DIAGNOSIS — Z0000 Encounter for general adult medical examination without abnormal findings: Secondary | ICD-10-CM

## 2014-08-06 LAB — MICROALBUMIN / CREATININE URINE RATIO
CREATININE, U: 149.9 mg/dL
Microalb Creat Ratio: 0.6 mg/g (ref 0.0–30.0)
Microalb, Ur: 0.9 mg/dL (ref 0.0–1.9)

## 2014-08-06 LAB — CBC WITH DIFFERENTIAL/PLATELET
BASOS ABS: 0 10*3/uL (ref 0.0–0.1)
BASOS PCT: 0.4 % (ref 0.0–3.0)
Eosinophils Absolute: 0.1 10*3/uL (ref 0.0–0.7)
Eosinophils Relative: 1.8 % (ref 0.0–5.0)
HCT: 40.3 % (ref 36.0–46.0)
HEMOGLOBIN: 13.5 g/dL (ref 12.0–15.0)
Lymphocytes Relative: 37.3 % (ref 12.0–46.0)
Lymphs Abs: 2.1 10*3/uL (ref 0.7–4.0)
MCHC: 33.6 g/dL (ref 30.0–36.0)
MCV: 85.3 fl (ref 78.0–100.0)
MONOS PCT: 9.6 % (ref 3.0–12.0)
Monocytes Absolute: 0.5 10*3/uL (ref 0.1–1.0)
Neutro Abs: 2.9 10*3/uL (ref 1.4–7.7)
Neutrophils Relative %: 50.9 % (ref 43.0–77.0)
Platelets: 230 10*3/uL (ref 150.0–400.0)
RBC: 4.73 Mil/uL (ref 3.87–5.11)
RDW: 14.1 % (ref 11.5–15.5)
WBC: 5.7 10*3/uL (ref 4.0–10.5)

## 2014-08-06 LAB — BASIC METABOLIC PANEL
BUN: 19 mg/dL (ref 6–23)
CALCIUM: 10.2 mg/dL (ref 8.4–10.5)
CO2: 27 mEq/L (ref 19–32)
Chloride: 103 mEq/L (ref 96–112)
Creatinine, Ser: 1 mg/dL (ref 0.4–1.2)
GFR: 56.48 mL/min — AB (ref >60.00)
Glucose, Bld: 121 mg/dL — ABNORMAL HIGH (ref 70–99)
Potassium: 3.9 mEq/L (ref 3.5–5.1)
SODIUM: 141 meq/L (ref 135–145)

## 2014-08-06 LAB — HEPATIC FUNCTION PANEL
ALK PHOS: 62 U/L (ref 39–117)
ALT: 55 U/L — ABNORMAL HIGH (ref 0–35)
AST: 47 U/L — ABNORMAL HIGH (ref 0–37)
Albumin: 4.1 g/dL (ref 3.5–5.2)
BILIRUBIN DIRECT: 0.2 mg/dL (ref 0.0–0.3)
TOTAL PROTEIN: 7.7 g/dL (ref 6.0–8.3)
Total Bilirubin: 1.3 mg/dL — ABNORMAL HIGH (ref 0.2–1.2)

## 2014-08-06 LAB — HEMOGLOBIN A1C: Hgb A1c MFr Bld: 6.9 % — ABNORMAL HIGH (ref 4.6–6.5)

## 2014-08-06 LAB — TSH: TSH: 2.24 u[IU]/mL (ref 0.35–4.50)

## 2014-08-06 NOTE — Assessment & Plan Note (Signed)
CBC

## 2014-08-06 NOTE — Assessment & Plan Note (Signed)
NMR Lipid Panel, LFTs, TSH

## 2014-08-06 NOTE — Assessment & Plan Note (Signed)
Blood pressure goals reviewed. BMET 

## 2014-08-06 NOTE — Progress Notes (Signed)
Pre visit review using our clinic review tool, if applicable. No additional management support is needed unless otherwise documented below in the visit note. 

## 2014-08-06 NOTE — Assessment & Plan Note (Signed)
A1c , urine microalbumin, BMET 

## 2014-08-06 NOTE — Patient Instructions (Signed)
Your next office appointment will be determined based upon review of your pending labs. Those instructions will be transmitted to you through My Chart . 

## 2014-08-06 NOTE — Progress Notes (Signed)
Subjective:    Patient ID: [name], female    DOB: Nov 05, 1940, 73 y.o.   MRN: [id number]  HPI  UHC/Medicare Wellness Visit: Psychosocial and medical history were reviewed as required by Medicare (history related to abuse, antisocial behavior , firearm risk). Social history: Caffeine: 1 cup 1-2 x /week  , Alcohol: none, Tobacco SVX:[id number] 1979 Exercise:see below Personal safety/fall risk:no Limitations of activities of daily living:no Seatbelt/ smoke alarm use:yes Healthcare Power of Attorney/Living Will status: UTD Ophthalmologic exam status:5/15 Hearing evaluation status: not UTD Orientation: Oriented X 3 Memory and recall: good Spelling  testing: good Depression/anxiety assessment: no Foreign travel history: never Immunization status for influenza/pneumonia/ shingles /tetanus: Prevnar needed Transfusion history:1976 Preventive health care maintenance status: Colonoscopy/BMD/mammogram/Pap as per protocol/standard care:UTD Dental care:every 6 months Chart reviewed and updated. Active issues reviewed and addressed as documented below.  She has been compliant with her medications and denies adverse effects. She is on no specific diet. Typically she will walk her dog 4 days a week without cardio pulmonary symptoms. Fasting blood sugars average less than 140; 2 hours after meal highest is less than 120. She denies any hypoglycemia or other associated diabetic symptoms. She saw her ophthalmologist in May; she has no retinopathy. Foot exam was done in June. Blood Pressures range 125-135 over the Lower 70s.      Review of Systems   Chest pain, palpitations, tachycardia, exertional dyspnea, paroxysmal nocturnal dyspnea, claudication or edema are absent.  Unexplained weight loss, abdominal pain, significant dyspepsia, dysphagia, melena, rectal bleeding, or persistently small caliber stools are denied.     Objective:   Physical Exam Gen.: Healthy and well-nourished in  appearance. Alert, appropriate and cooperative throughout exam. Appears younger than stated age  Head: Normocephalic without obvious abnormalities  Eyes: No corneal or conjunctival inflammation noted. Pupils equal round reactive to light and accommodation. Extraocular motion intact. OD ptosis Ears: External  ear exam reveals no significant lesions or deformities. Wax bilaterally. Hearing is grossly normal bilaterally. Nose: External nasal exam reveals no deformity or inflammation. Nasal mucosa are pink and moist. No lesions or exudates noted.   Mouth: Oral mucosa and oropharynx reveal no lesions or exudates. Teeth in good repair. Neck: No deformities, masses, or tenderness noted. Range of motion &. Thyroid normal Lungs: Normal respiratory effort; chest expands symmetrically. Lungs are clear to auscultation without rales, wheezes, or increased work of breathing. Heart: Normal rate and rhythm. Normal S1 and S2. No gallop, click, or rub. No murmur. Abdomen: Protuberant.Bowel sounds normal; abdomen soft and nontender. No masses, organomegaly or hernias noted. Genitalia: as per Gyn                                  Musculoskeletal/extremities: No deformity or scoliosis noted of  the thoracic or lumbar spine.  No clubbing, cyanosis, edema, or significant extremity  deformity noted. Range of motion normal .Tone & strength normal. Hand joints normal Fingernail  health good. Crepitus knees. Able to lie down & sit up w/o help. Negative SLR bilaterally Vascular: Carotid, radial artery, dorsalis pedis and  posterior tibial pulses are full and equal. No bruits present. Neurologic: Alert and oriented x3. Deep tendon reflexes symmetrical and normal.  Gait normal .     Skin: Intact without suspicious lesions or rashes. Lymph: No cervical, axillary lymphadenopathy present. Psych: Mood and affect are normal. Normally interactive  Assessment & Plan:  See Current Assessment & Plan in Problem List under specific DiagnosisThe labs will be reviewed and risks and options assessed. Written recommendations will be provided by mail or directly through My Chart.Further evaluation or change in medical therapy will be directed by those results.

## 2014-08-08 LAB — NMR LIPOPROFILE WITH LIPIDS
Cholesterol, Total: 187 mg/dL (ref 100–199)
HDL Particle Number: 25.3 umol/L — ABNORMAL LOW (ref >=30.5)
HDL SIZE: 8.5 nm — AB (ref >=9.2)
HDL-C: 37 mg/dL — ABNORMAL LOW (ref >39)
LDL CALC: 107 mg/dL — AB (ref 0–99)
LDL PARTICLE NUMBER: 1819 nmol/L — AB (ref <1000)
LDL SIZE: 20.8 nm (ref >=20.8)
LP-IR SCORE: 76 — AB (ref <=45)
Large HDL-P: <1.3 umol/L — ABNORMAL LOW (ref >=4.8)
Large VLDL-P: 5 nmol/L — ABNORMAL HIGH (ref <=2.7)
SMALL LDL PARTICLE NUMBER: 1065 nmol/L — AB (ref <=527)
Triglycerides: 214 mg/dL — ABNORMAL HIGH (ref 0–149)
VLDL SIZE: 46.6 nm (ref <=46.6)

## 2014-08-10 ENCOUNTER — Other Ambulatory Visit: Payer: Self-pay | Admitting: Internal Medicine

## 2014-08-10 DIAGNOSIS — R945 Abnormal results of liver function studies: Principal | ICD-10-CM

## 2014-08-10 DIAGNOSIS — R7989 Other specified abnormal findings of blood chemistry: Secondary | ICD-10-CM

## 2014-08-10 DIAGNOSIS — E119 Type 2 diabetes mellitus without complications: Secondary | ICD-10-CM

## 2014-08-10 HISTORY — DX: Other specified abnormal findings of blood chemistry: R79.89

## 2014-08-14 ENCOUNTER — Other Ambulatory Visit: Payer: Self-pay

## 2014-08-14 DIAGNOSIS — Z853 Personal history of malignant neoplasm of breast: Secondary | ICD-10-CM

## 2014-08-17 ENCOUNTER — Other Ambulatory Visit: Payer: Medicare Other

## 2014-08-17 ENCOUNTER — Ambulatory Visit (HOSPITAL_BASED_OUTPATIENT_CLINIC_OR_DEPARTMENT_OTHER): Payer: Medicare Other | Admitting: Hematology and Oncology

## 2014-08-17 ENCOUNTER — Telehealth: Payer: Self-pay | Admitting: Hematology and Oncology

## 2014-08-17 VITALS — BP 146/56 | HR 80 | Temp 97.9°F | Resp 18 | Ht 64.5 in | Wt 176.2 lb

## 2014-08-17 DIAGNOSIS — Z853 Personal history of malignant neoplasm of breast: Secondary | ICD-10-CM

## 2014-08-17 DIAGNOSIS — C50111 Malignant neoplasm of central portion of right female breast: Secondary | ICD-10-CM

## 2014-08-17 NOTE — Progress Notes (Signed)
Patient Care Team: Hendricks Limes, MD as PCP - General  DIAGNOSIS: No matching staging information was found for the patient.  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 Migdalia Dk    CHIEF COMPLIANT: followup of breast cancer  INTERVAL HISTORY: [name] is a 73 year old Caucasian with above-mentioned history of right-sided breast cancer treated with bilateral mastectomies followed by antiestrogen therapy for one and half year but she stopped taking it because of side effects to treatment. She had reconstruction of the breast by Dr. Migdalia Dk. She is here for physical exam and followup. She reports no new problems or concerns. Denies any new lumps or nodules in the breast.  REVIEW OF SYSTEMS:   Constitutional: Denies fevers, chills or abnormal weight loss Eyes: Denies blurriness of vision Ears, nose, mouth, throat, and face: Denies mucositis or sore throat Respiratory: Denies cough, dyspnea or wheezes Cardiovascular: Denies palpitation, chest discomfort or lower extremity swelling Gastrointestinal:  Denies nausea, heartburn or change in bowel habits Skin: Denies abnormal skin rashes Lymphatics: Denies new lymphadenopathy or easy bruising Neurological:Denies numbness, tingling or new weaknesses Behavioral/Psych: Mood is stable, no new changes  Breast:  denies any pain  or lumps or nodules in either breasts All other systems were reviewed with the patient and are negative.  I have reviewed the past medical history, past surgical history, social history and family history with the patient and they are unchanged from previous note.  ALLERGIES:  is allergic to sulfur; morphine and related; ace inhibitors; and codeine.  MEDICATIONS:  Current Outpatient Prescriptions  Medication Sig Dispense Refill  . Calcium Carbonate-Vitamin D (CALCIUM + D) 600-200 MG-UNIT TABS Take 1 tablet by mouth 2 (two) times daily.     . Cholecalciferol (VITAMIN D3) 1000 UNITS CAPS Take 1 capsule by mouth 2 (two) times daily.     . cloNIDine (CATAPRES) 0.2 MG tablet TAKE ONE TABLET BY MOUTH TWICE DAILY 180 tablet 3  . diphenhydrAMINE (BENADRYL) 25 MG tablet Take 12.5 mg by mouth at bedtime as needed.     Marland Kitchen losartan-hydrochlorothiazide (HYZAAR) 100-12.5 MG per tablet TAKE 1 TABLET ONCE DAILY. 90 tablet 3  . metFORMIN (GLUCOPHAGE) 500 MG tablet Take 1 tablet (500 mg total) by mouth 2 (two) times daily with a meal. 180 tablet 3  . Multiple Vitamin (MULTIVITAMIN) capsule Take 1 capsule by mouth daily.      . Omega-3 Fatty Acids (FISH OIL CONCENTRATE) 1000 MG CAPS Take by mouth 2 (two) times daily.     . ondansetron (ZOFRAN) 4 MG tablet Take 1 tablet (4 mg total) by mouth every 8 (eight) hours as needed for nausea or vomiting. 10 tablet 0  . oxybutynin (OXYTROL) 3.9 MG/24HR Place 1 patch onto the skin 2 (two) times a week.      . pantoprazole (PROTONIX) 40 MG tablet TAKE 1 TABLET 30 MINUTES BEFORE A MEAL. 30 tablet 8  . Probiotic  Product (ALIGN) 4 MG CAPS Take by mouth as needed.     . ranitidine (ZANTAC) 150 MG tablet Take 150 mg by mouth 2 (two) times daily.     No current facility-administered medications for this visit.    PHYSICAL EXAMINATION: ECOG PERFORMANCE STATUS: 0 - Asymptomatic  Filed Vitals:   08/17/14 0846  BP: 146/56  Pulse: 80  Temp: 97.9 F (36.6 C)  Resp: 18    Filed Weights   08/17/14 0846  Weight: 176 lb 3.2 oz (79.924 kg)    GENERAL:alert, no distress and comfortable SKIN: skin color, texture, turgor are normal, no rashes or significant lesions EYES: normal, Conjunctiva are pink and non-injected, sclera clear OROPHARYNX:no exudate, no erythema and lips, buccal mucosa, and tongue normal  NECK: supple, thyroid normal size, non-tender, without nodularity LYMPH:  no palpable lymphadenopathy in the cervical, axillary or inguinal LUNGS: clear to auscultation and percussion with normal breathing effort HEART: regular rate & rhythm and no murmurs and no lower extremity edema ABDOMEN:abdomen soft, non-tender and normal bowel sounds Musculoskeletal:no cyanosis of digits and no clubbing  NEURO: alert & oriented x 3 with fluent speech, no focal motor/sensory deficits BREAST: No palpable masses or nodules in either right or left breasts. No palpable axillary supraclavicular or infraclavicular adenopathy.   LABORATORY DATA:  I have reviewed the data as listed   Chemistry      Component Value Date/Time   NA 141 08/06/2014 1230   NA 144 01/27/2014 1011   K 3.9 08/06/2014 1230   K 3.6 01/27/2014 1011   CL 103 08/06/2014 1230   CL 106 12/25/2012 0827   CO2 27 08/06/2014 1230   CO2 26 01/27/2014 1011   BUN 19 08/06/2014 1230   BUN 13.1 01/27/2014 1011   CREATININE 1.0 08/06/2014 1230   CREATININE 1.1 01/27/2014 1011      Component Value Date/Time   CALCIUM 10.2 08/06/2014 1230   CALCIUM 10.3 01/27/2014 1011   ALKPHOS 62 08/06/2014 1230   ALKPHOS 63 01/27/2014 1011   AST 47* 08/06/2014 1230   AST 31 01/27/2014 1011   ALT 55* 08/06/2014 1230   ALT 45 01/27/2014 1011   BILITOT 1.3* 08/06/2014 1230   BILITOT 0.97 01/27/2014 1011       Lab Results  Component Value Date   WBC 5.7 08/06/2014   HGB 13.5 08/06/2014   HCT 40.3 08/06/2014   MCV 85.3 08/06/2014   PLT 230.0 08/06/2014   NEUTROABS 2.9 08/06/2014     ASSESSMENT & PLAN:   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  Surveillance: Patient does not have any clinical evidence of disease recurrence. Since she had bilateral mastectomies there is no role of radiology for surveillance. I discussed with her that she can now come once a year for surveillance exams. I reviewed her blood work which appear to be normal.  Survivorship:Discussed the importance of physical exercise in decreasing the likelihood of breast cancer recurrence. Recommended 30 mins daily 6 days a week of either brisk walking or cycling or swimming. Encouraged patient to eat more fruits and vegetables and decrease red meat.   I discussed NCCN guidelines do not mandate blood work routinely for breast cancer surveillance.   No orders of the defined types were  placed in this encounter.   The patient has a good understanding of the overall plan. she agrees with it. She will call with any problems that may develop before her next visit here.   Rulon Eisenmenger, MD 08/17/2014 10:06 AM

## 2014-08-17 NOTE — Telephone Encounter (Signed)
gv pt appt schedule - no lab orders/referrals or additional procedures.

## 2014-08-17 NOTE — Assessment & Plan Note (Addendum)
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  Surveillance: Patient does not have any clinical evidence of disease recurrence. Since she had bilateral mastectomies there is no role of radiology for surveillance. I discussed with her that she can now come once a year for surveillance exams. I reviewed her blood work which appear to be normal.  Survivorship:Discussed the importance of physical exercise in decreasing the likelihood of breast cancer recurrence. Recommended 30 mins daily 6 days a week of either brisk walking or cycling or swimming. Encouraged patient to eat more fruits and vegetables and decrease red meat.   I discussed NCCN guidelines do not mandate blood work routinely for breast cancer surveillance.

## 2014-09-07 ENCOUNTER — Ambulatory Visit (INDEPENDENT_AMBULATORY_CARE_PROVIDER_SITE_OTHER): Payer: Medicare Other

## 2014-09-07 DIAGNOSIS — Z23 Encounter for immunization: Secondary | ICD-10-CM

## 2014-09-15 ENCOUNTER — Other Ambulatory Visit: Payer: Self-pay | Admitting: Internal Medicine

## 2014-12-25 DIAGNOSIS — Z853 Personal history of malignant neoplasm of breast: Secondary | ICD-10-CM | POA: Diagnosis not present

## 2014-12-25 DIAGNOSIS — Z9882 Breast implant status: Secondary | ICD-10-CM | POA: Diagnosis not present

## 2015-02-01 DIAGNOSIS — E119 Type 2 diabetes mellitus without complications: Secondary | ICD-10-CM | POA: Diagnosis not present

## 2015-02-01 DIAGNOSIS — H25813 Combined forms of age-related cataract, bilateral: Secondary | ICD-10-CM | POA: Diagnosis not present

## 2015-02-01 DIAGNOSIS — H16103 Unspecified superficial keratitis, bilateral: Secondary | ICD-10-CM | POA: Diagnosis not present

## 2015-02-01 DIAGNOSIS — H04123 Dry eye syndrome of bilateral lacrimal glands: Secondary | ICD-10-CM | POA: Diagnosis not present

## 2015-02-01 LAB — HM DIABETES EYE EXAM

## 2015-02-10 ENCOUNTER — Encounter: Payer: Self-pay | Admitting: Internal Medicine

## 2015-04-15 ENCOUNTER — Ambulatory Visit (INDEPENDENT_AMBULATORY_CARE_PROVIDER_SITE_OTHER): Payer: Medicare Other | Admitting: Internal Medicine

## 2015-04-15 ENCOUNTER — Other Ambulatory Visit (INDEPENDENT_AMBULATORY_CARE_PROVIDER_SITE_OTHER): Payer: Medicare Other

## 2015-04-15 ENCOUNTER — Encounter: Payer: Self-pay | Admitting: Internal Medicine

## 2015-04-15 VITALS — BP 137/75 | HR 77 | Temp 98.0°F | Resp 16 | Wt 176.0 lb

## 2015-04-15 DIAGNOSIS — E785 Hyperlipidemia, unspecified: Secondary | ICD-10-CM

## 2015-04-15 DIAGNOSIS — I10 Essential (primary) hypertension: Secondary | ICD-10-CM

## 2015-04-15 DIAGNOSIS — Z86010 Personal history of colonic polyps: Secondary | ICD-10-CM

## 2015-04-15 DIAGNOSIS — E119 Type 2 diabetes mellitus without complications: Secondary | ICD-10-CM

## 2015-04-15 LAB — HEPATIC FUNCTION PANEL
ALK PHOS: 65 U/L (ref 39–117)
ALT: 92 U/L — AB (ref 0–35)
AST: 87 U/L — AB (ref 0–37)
Albumin: 4.2 g/dL (ref 3.5–5.2)
BILIRUBIN DIRECT: 0.2 mg/dL (ref 0.0–0.3)
Total Bilirubin: 1 mg/dL (ref 0.2–1.2)
Total Protein: 7.3 g/dL (ref 6.0–8.3)

## 2015-04-15 LAB — CBC WITH DIFFERENTIAL/PLATELET
Basophils Absolute: 0 10*3/uL (ref 0.0–0.1)
Basophils Relative: 0.3 % (ref 0.0–3.0)
EOS PCT: 2.1 % (ref 0.0–5.0)
Eosinophils Absolute: 0.1 10*3/uL (ref 0.0–0.7)
HCT: 39 % (ref 36.0–46.0)
Hemoglobin: 13.4 g/dL (ref 12.0–15.0)
LYMPHS ABS: 2.2 10*3/uL (ref 0.7–4.0)
LYMPHS PCT: 35.7 % (ref 12.0–46.0)
MCHC: 34.2 g/dL (ref 30.0–36.0)
MCV: 85.5 fl (ref 78.0–100.0)
MONO ABS: 0.6 10*3/uL (ref 0.1–1.0)
Monocytes Relative: 10.1 % (ref 3.0–12.0)
NEUTROS ABS: 3.1 10*3/uL (ref 1.4–7.7)
NEUTROS PCT: 51.8 % (ref 43.0–77.0)
Platelets: 230 10*3/uL (ref 150.0–400.0)
RBC: 4.57 Mil/uL (ref 3.87–5.11)
RDW: 14.2 % (ref 11.5–15.5)
WBC: 6 10*3/uL (ref 4.0–10.5)

## 2015-04-15 LAB — HEMOGLOBIN A1C: HEMOGLOBIN A1C: 7.1 % — AB (ref 4.6–6.5)

## 2015-04-15 LAB — LIPID PANEL
Cholesterol: 186 mg/dL (ref 0–200)
HDL: 35.7 mg/dL — ABNORMAL LOW (ref >39.00)
NONHDL: 150.43
Total CHOL/HDL Ratio: 5
Triglycerides: 215 mg/dL — ABNORMAL HIGH (ref 0.0–149.0)
VLDL: 43 mg/dL — AB (ref 0.0–40.0)

## 2015-04-15 LAB — BASIC METABOLIC PANEL
BUN: 15 mg/dL (ref 6–23)
CO2: 31 meq/L (ref 19–32)
Calcium: 10.7 mg/dL — ABNORMAL HIGH (ref 8.4–10.5)
Chloride: 102 mEq/L (ref 96–112)
Creatinine, Ser: 0.96 mg/dL (ref 0.40–1.20)
GFR: 60.46 mL/min (ref >60.00)
Glucose, Bld: 124 mg/dL — ABNORMAL HIGH (ref 70–99)
POTASSIUM: 4 meq/L (ref 3.5–5.1)
SODIUM: 141 meq/L (ref 135–145)

## 2015-04-15 LAB — MICROALBUMIN / CREATININE URINE RATIO
CREATININE, U: 91.8 mg/dL
MICROALB/CREAT RATIO: 0.8 mg/g (ref 0.0–30.0)
Microalb, Ur: <0.7 mg/dL (ref 0.0–1.9)

## 2015-04-15 LAB — LDL CHOLESTEROL, DIRECT: Direct LDL: 120 mg/dL

## 2015-04-15 LAB — TSH: TSH: 2.79 u[IU]/mL (ref 0.35–4.50)

## 2015-04-15 NOTE — Progress Notes (Signed)
   Subjective:    Patient ID: [name], female    DOB: 26-Oct-1940, 74 y.o.   MRN: [id number]  HPI She is here to assess status of active health conditions:  Diet/ nutrition: Heart healthy, low-salt, low carb Exercise program: Curtailed recently because of the heat; she usually walks 30-45 minutes 3 times a week without cardiopulmonary symptoms  HYPERTENSION: Disease Monitoring: Blood pressure range/ average : 130-135/75 Medication Compliance: Yes, without adverse effects  Diabetes :  FBS range/average: Rarely checked; yesterday 147 Highest 2 hr post meal glucose: Less than 160 Medication compliance: Yes Hypoglycemia: No Ophthamology care: Current; she has cataracts which are being monitored Podiatry care: Not up-to-date      Review of Systems   Chest pain, palpitations:   No    Dyspnea: No Edema: No Claudication: No Lightheadedness,Syncope: No Weight gain/loss: Up 2 pounds Polyuria/phagia/dipsia:     No Blurred vision /diplopia/lossof vision: Only related to cataracts Limb numbness/tingling/burning: No Non healing skin lesions: No Abd pain, bowel changes: No  Myalgias: No; but significant degenerative joint changes of the knees Memory loss: No        Objective:   Physical Exam  Pertinent or positive findings include: Abdomen is protuberant. Posterior tibial pulses are decreased. Deep tendon reflexes 1/2+ at the knees.Marked crepitus of knees.  General appearance :adequately nourished; in no distress.  Eyes: No conjunctival inflammation or scleral icterus is present.  Oral exam:  Lips and gums are healthy appearing.There is no oropharyngeal erythema or exudate noted. Dental hygiene is good.  Heart:  Normal rate and regular rhythm. S1 and S2 normal without gallop, murmur, click, rub or other extra sounds    Lungs:Chest clear to auscultation; no wheezes, rhonchi,rales ,or rubs present.No increased work of breathing.   Abdomen: bowel sounds normal,  soft and non-tender without masses, organomegaly or hernias noted.  No guarding or rebound.   Vascular : all pulses equal ; no bruits present.  Skin:Warm & dry.  Intact without suspicious lesions or rashes ; no tenting or jaundice   Lymphatic: No lymphadenopathy is noted about the head, neck, axilla   Neuro: Strength, tone  normal.       Assessment & Plan:  See Current Assessment & Plan in Problem List under specific Diagnosis

## 2015-04-15 NOTE — Assessment & Plan Note (Signed)
A1c , urine microalbumin, BMET 

## 2015-04-15 NOTE — Patient Instructions (Signed)
  Your next office appointment will be determined based upon review of your pending labs. Those written interpretation of the lab results and instructions will be transmitted to you by My Chart  Critical results will be called.   Followup as needed for any active or acute issue. Please report any significant change in your symptoms. 

## 2015-04-15 NOTE — Progress Notes (Signed)
Pre visit review using our clinic review tool, if applicable. No additional management support is needed unless otherwise documented below in the visit note. 

## 2015-04-15 NOTE — Assessment & Plan Note (Signed)
CBC

## 2015-04-15 NOTE — Assessment & Plan Note (Signed)
Blood pressure goals reviewed. BMET 

## 2015-04-15 NOTE — Assessment & Plan Note (Signed)
Lipids, LFTs, TSH  

## 2015-04-17 ENCOUNTER — Other Ambulatory Visit: Payer: Self-pay | Admitting: Internal Medicine

## 2015-04-29 ENCOUNTER — Other Ambulatory Visit: Payer: Self-pay | Admitting: Internal Medicine

## 2015-06-15 ENCOUNTER — Ambulatory Visit: Payer: Medicare Other | Admitting: Internal Medicine

## 2015-06-18 ENCOUNTER — Other Ambulatory Visit (INDEPENDENT_AMBULATORY_CARE_PROVIDER_SITE_OTHER): Payer: Medicare Other

## 2015-06-18 ENCOUNTER — Ambulatory Visit (INDEPENDENT_AMBULATORY_CARE_PROVIDER_SITE_OTHER): Payer: Medicare Other | Admitting: Internal Medicine

## 2015-06-18 ENCOUNTER — Encounter: Payer: Self-pay | Admitting: Internal Medicine

## 2015-06-18 DIAGNOSIS — Z23 Encounter for immunization: Secondary | ICD-10-CM | POA: Diagnosis not present

## 2015-06-18 DIAGNOSIS — R945 Abnormal results of liver function studies: Secondary | ICD-10-CM

## 2015-06-18 DIAGNOSIS — E785 Hyperlipidemia, unspecified: Secondary | ICD-10-CM

## 2015-06-18 DIAGNOSIS — R7989 Other specified abnormal findings of blood chemistry: Secondary | ICD-10-CM | POA: Diagnosis not present

## 2015-06-18 LAB — HEPATIC FUNCTION PANEL
ALT: 59 U/L — AB (ref 0–35)
AST: 41 U/L — ABNORMAL HIGH (ref 0–37)
Albumin: 4.2 g/dL (ref 3.5–5.2)
Alkaline Phosphatase: 58 U/L (ref 39–117)
BILIRUBIN DIRECT: 0.2 mg/dL (ref 0.0–0.3)
BILIRUBIN TOTAL: 1.1 mg/dL (ref 0.2–1.2)
Total Protein: 7.4 g/dL (ref 6.0–8.3)

## 2015-06-18 LAB — LIPID PANEL
Cholesterol: 172 mg/dL (ref 0–200)
HDL: 33 mg/dL — ABNORMAL LOW (ref >39.00)
NonHDL: 138.57
Total CHOL/HDL Ratio: 5
Triglycerides: 215 mg/dL — ABNORMAL HIGH (ref 0.0–149.0)
VLDL: 43 mg/dL — AB (ref 0.0–40.0)

## 2015-06-18 LAB — LDL CHOLESTEROL, DIRECT: Direct LDL: 128 mg/dL

## 2015-06-18 NOTE — Progress Notes (Signed)
   Subjective:    Patient ID: [name], female    DOB: 12-14-1940, 74 y.o.   MRN: [id number]  HPI The patient is here to assess status of hypercalcemia, elevated hepatic enzymes and dyslipidemia.  She has stopped her vitamin D supplementation and calcium supplementation.  She avoids fried foods and is on low-salt diet. She will eat some red meats. She has been compliant with her medications. She walks twice a week for 35 minutes and also works in the yard 4 days a week. She has no associated cardiopulmonary symptoms.  Her active symptoms include bilateral knee joint pain with pain up to level V. Otherwise she has no active symptoms.  She has been monitored by her Ophthalmologist for cataracts.  Her fasting glucoses range 140-160s. Blood pressure average is 130 over 70s.  She has not had to take her Protonix for over a year.  Last labs were 04/15/15. Calcium was elevated at 10.7 and glucose 124. Hepatic enzymes were mildly elevated with an AST of 87 and a LT of 92. Suboptimal nutrition was suggested by triglycerides of 215. LDL was 120.  Diabetic control was adequate with an A1c of 7.1%. TSH was therapeutic.  Review of Systems  Chest pain, palpitations, tachycardia, exertional dyspnea, paroxysmal nocturnal dyspnea, claudication or edema are absent. No unexplained weight loss, abdominal pain, significant dyspepsia, dysphagia, melena, rectal bleeding, or persistently small caliber stools. Dysuria, pyuria, hematuria, frequency, nocturia or polyuria are denied. Change in hair, skin, nails denied. No bowel changes of constipation or diarrhea. No intolerance to heat or cold.     Objective:   Physical Exam  Pertinent or positive findings include: Ptosis is present greater on the right than the left. Abdomen is protuberant. She has trace edema. She has severe crepitus of the knees.  General appearance :adequately nourished; in no distress.  Eyes: No conjunctival inflammation or  scleral icterus is present.  Oral exam:  Lips and gums are healthy appearing.There is no oropharyngeal erythema or exudate noted. Dental hygiene is good.  Heart:  Normal rate and regular rhythm. S1 and S2 normal without gallop, murmur, click, rub or other extra sounds    Lungs:Chest clear to auscultation; no wheezes, rhonchi,rales ,or rubs present..   Abdomen: bowel sounds normal, soft and non-tender without masses, organomegaly or hernias noted.   Vascular : all pulses equal ; no bruits present.  Skin:Warm & dry.  Intact without suspicious lesions or rashes ; no tenting or jaundice   Lymphatic: No lymphadenopathy is noted about the head, neck, axilla.   Neuro: Strength, tone & DTRs normal.         Assessment & Plan:  See Current Assessment & Plan in Problem List under specific Diagnosis

## 2015-06-18 NOTE — Assessment & Plan Note (Addendum)
Lipids, LFTs Nutrition discussed

## 2015-06-18 NOTE — Progress Notes (Signed)
Pre visit review using our clinic review tool, if applicable. No additional management support is needed unless otherwise documented below in the visit note. 

## 2015-06-18 NOTE — Patient Instructions (Signed)
  Your next office appointment will be determined based upon review of your pending labs. Those written interpretation of the lab results and instructions will be transmitted to you by My Chart  Critical results will be called.   Followup as needed for any active or acute issue. Please report any significant change in your symptoms. 

## 2015-06-18 NOTE — Assessment & Plan Note (Signed)
Ca++ & PTH

## 2015-06-21 LAB — PTH, INTACT AND CALCIUM
CALCIUM: 10 mg/dL (ref 8.4–10.5)
PTH: 26 pg/mL (ref 14–64)

## 2015-07-28 ENCOUNTER — Encounter: Payer: Medicare Other | Admitting: Internal Medicine

## 2015-08-04 ENCOUNTER — Encounter: Payer: Self-pay | Admitting: Internal Medicine

## 2015-08-04 ENCOUNTER — Ambulatory Visit (INDEPENDENT_AMBULATORY_CARE_PROVIDER_SITE_OTHER): Payer: Medicare Other | Admitting: Internal Medicine

## 2015-08-04 ENCOUNTER — Other Ambulatory Visit (INDEPENDENT_AMBULATORY_CARE_PROVIDER_SITE_OTHER): Payer: Medicare Other

## 2015-08-04 VITALS — BP 132/74 | HR 77 | Temp 97.9°F | Resp 16 | Wt 175.0 lb

## 2015-08-04 DIAGNOSIS — M1712 Unilateral primary osteoarthritis, left knee: Secondary | ICD-10-CM

## 2015-08-04 DIAGNOSIS — K219 Gastro-esophageal reflux disease without esophagitis: Secondary | ICD-10-CM

## 2015-08-04 DIAGNOSIS — Z9013 Acquired absence of bilateral breasts and nipples: Secondary | ICD-10-CM

## 2015-08-04 DIAGNOSIS — Z853 Personal history of malignant neoplasm of breast: Secondary | ICD-10-CM | POA: Diagnosis not present

## 2015-08-04 DIAGNOSIS — Z0000 Encounter for general adult medical examination without abnormal findings: Secondary | ICD-10-CM

## 2015-08-04 DIAGNOSIS — F418 Other specified anxiety disorders: Secondary | ICD-10-CM | POA: Diagnosis not present

## 2015-08-04 DIAGNOSIS — I10 Essential (primary) hypertension: Secondary | ICD-10-CM

## 2015-08-04 DIAGNOSIS — E785 Hyperlipidemia, unspecified: Secondary | ICD-10-CM

## 2015-08-04 DIAGNOSIS — E119 Type 2 diabetes mellitus without complications: Secondary | ICD-10-CM

## 2015-08-04 HISTORY — DX: Personal history of malignant neoplasm of breast: Z85.3

## 2015-08-04 HISTORY — DX: Acquired absence of bilateral breasts and nipples: Z90.13

## 2015-08-04 HISTORY — DX: Unilateral primary osteoarthritis, left knee: M17.12

## 2015-08-04 LAB — COMPREHENSIVE METABOLIC PANEL
ALT: 54 U/L — ABNORMAL HIGH (ref 0–35)
AST: 43 U/L — AB (ref 0–37)
Albumin: 4.1 g/dL (ref 3.5–5.2)
Alkaline Phosphatase: 61 U/L (ref 39–117)
BUN: 19 mg/dL (ref 6–23)
CALCIUM: 10.1 mg/dL (ref 8.4–10.5)
CHLORIDE: 105 meq/L (ref 96–112)
CO2: 28 meq/L (ref 19–32)
CREATININE: 1 mg/dL (ref 0.40–1.20)
GFR: 57.63 mL/min — ABNORMAL LOW (ref >60.00)
Glucose, Bld: 147 mg/dL — ABNORMAL HIGH (ref 70–99)
POTASSIUM: 4.4 meq/L (ref 3.5–5.1)
Sodium: 141 mEq/L (ref 135–145)
Total Bilirubin: 1 mg/dL (ref 0.2–1.2)
Total Protein: 7.3 g/dL (ref 6.0–8.3)

## 2015-08-04 LAB — HEMOGLOBIN A1C: Hgb A1c MFr Bld: 6.5 % (ref 4.6–6.5)

## 2015-08-04 LAB — CBC WITH DIFFERENTIAL/PLATELET
BASOS PCT: 0.6 % (ref 0.0–3.0)
Basophils Absolute: 0 10*3/uL (ref 0.0–0.1)
EOS ABS: 0.3 10*3/uL (ref 0.0–0.7)
EOS PCT: 4 % (ref 0.0–5.0)
HEMATOCRIT: 41 % (ref 36.0–46.0)
Hemoglobin: 13.9 g/dL (ref 12.0–15.0)
LYMPHS PCT: 29.4 % (ref 12.0–46.0)
Lymphs Abs: 2 10*3/uL (ref 0.7–4.0)
MCHC: 33.9 g/dL (ref 30.0–36.0)
MCV: 86.4 fl (ref 78.0–100.0)
MONO ABS: 0.7 10*3/uL (ref 0.1–1.0)
Monocytes Relative: 11.1 % (ref 3.0–12.0)
NEUTROS ABS: 3.7 10*3/uL (ref 1.4–7.7)
Neutrophils Relative %: 54.9 % (ref 43.0–77.0)
PLATELETS: 249 10*3/uL (ref 150.0–400.0)
RBC: 4.74 Mil/uL (ref 3.87–5.11)
RDW: 13.9 % (ref 11.5–15.5)
WBC: 6.7 10*3/uL (ref 4.0–10.5)

## 2015-08-04 LAB — LIPID PANEL
CHOLESTEROL: 188 mg/dL (ref 0–200)
HDL: 35.8 mg/dL — AB (ref >39.00)
LDL CALC: 115 mg/dL — AB (ref 0–99)
NonHDL: 151.99
TRIGLYCERIDES: 186 mg/dL — AB (ref 0.0–149.0)
Total CHOL/HDL Ratio: 5
VLDL: 37.2 mg/dL (ref 0.0–40.0)

## 2015-08-04 LAB — TSH: TSH: 2.21 u[IU]/mL (ref 0.35–4.50)

## 2015-08-04 MED ORDER — CLONIDINE HCL 0.2 MG PO TABS: 0.2000 mg | ORAL_TABLET | Freq: Two times a day (BID) | ORAL | Status: DC

## 2015-08-04 MED ORDER — METFORMIN HCL 500 MG PO TABS: ORAL_TABLET | ORAL | Status: DC

## 2015-08-04 MED ORDER — PANTOPRAZOLE SODIUM 40 MG PO TBEC: DELAYED_RELEASE_TABLET | ORAL | Status: DC

## 2015-08-04 MED ORDER — LOSARTAN POTASSIUM-HCTZ 100-12.5 MG PO TABS: 1.0000 | ORAL_TABLET | Freq: Every day | ORAL | Status: DC

## 2015-08-04 NOTE — Assessment & Plan Note (Addendum)
Follows with oncology - has an apppt this month Status post bilateral mastectomy No evidence of recurrence

## 2015-08-04 NOTE — Progress Notes (Signed)
Subjective:    Patient ID: [name], female    DOB: 1941/04/15, 74 y.o.   MRN: [id number]  HPI She is here to establish with a new pcp.  She is here for a physical exam/wellness visit.   Left knee pain - aches in posterior knee.  B/l knee arthritis.  Does exercises.    Here for medicare wellness, no new complaints. Please see A/P for status and treatment of chronic medical problems.     I have personally reviewed and have noted 1.The patient's medical and social history - reviewed today no changes 2.Their use of alcohol, tobacco or illicit drugs 3.Their current medications and supplements 4.The patient's functional ability including ADL's, fall risks, home safety risks and hearing or visual impairment. 5.Diet and physical activities 6.Evidence for depression or mood disorders 7.Care team reviewed and updated: oncology: Dr Lindi Adie, eye doctor - dr Nicholaus Corolla      Medications and allergies reviewed with patient and updated if appropriate.  Patient Active Problem List   Diagnosis Date Noted  . Hypercalcemia 04/17/2015  . Dyslipidemia 04/15/2015  . Elevated LFTs 08/10/2014  . Myalgia and myositis 02/26/2014  . Osteopenia 08/31/2013  . Transfusion history 08/18/2013  . IBS (irritable bowel syndrome) 08/18/2013  . 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 History of colonic polyps 06/06/2010  . Essential hypertension 02/16/2009  . Diabetes type 2, controlled ([place]) 08/11/2008  . Esophageal reflux 05/06/2008  . Hyperlipidemia 09/30/2007  . SWEATING 09/30/2007  . GILBERT'S SYNDROME 02/06/2007    Current Outpatient Prescriptions on File Prior to Visit  Medication Sig Dispense Refill  . cloNIDine (CATAPRES) 0.2 MG tablet TAKE 1 TABLET TWICE DAILY. 180 tablet 2  . diphenhydrAMINE (BENADRYL) 25 MG tablet Take 12.5 mg by mouth at bedtime as needed.     Marland Kitchen  losartan-hydrochlorothiazide (HYZAAR) 100-12.5 MG per tablet TAKE 1 TABLET ONCE DAILY. 90 tablet 2  . metFORMIN (GLUCOPHAGE) 500 MG tablet TAKE  (1)  TABLET TWICE A DAY WITH MEALS (BREAKFAST AND SUPPER) 180 tablet 2  . Multiple Vitamin (MULTIVITAMIN) capsule Take 1 capsule by mouth daily.      . Omega-3 Fatty Acids (FISH OIL CONCENTRATE) 1000 MG CAPS Take by mouth 2 (two) times daily.     Marland Kitchen oxybutynin (OXYTROL) 3.9 MG/24HR Place 1 patch onto the skin 2 (two) times a week.      . pantoprazole (PROTONIX) 40 MG tablet TAKE 1 TABLET 30 MINUTES BEFORE A MEAL. 90 tablet 1  . Probiotic Product (ALIGN) 4 MG CAPS Take by mouth as needed.     . ranitidine (ZANTAC) 150 MG tablet Take 150 mg by mouth 2 (two) times daily.    . Calcium Carbonate-Vitamin D (CALCIUM + D) 600-200 MG-UNIT TABS Take 1 tablet by mouth 2 (two) times daily.     . Cholecalciferol (VITAMIN D3) 1000 UNITS CAPS Take 1 capsule by mouth 2 (two) times daily. Takes 2000 units 4 days/week, takes 1000 units 3 days     No current facility-administered medications on file prior to visit.    Past Medical History  Diagnosis Date  . Diabetes mellitus   . IBS (irritable bowel syndrome)   . Breast cancer ([place]) 2012  . GERD (gastroesophageal reflux disease)   . Hypertension   . Gilbert syndrome   . Plantar fasciitis of right foot     Dr Oneta Rack  . Transfusion history 1976    Past Surgical History  Procedure Laterality Date  .  Abdominal hysterectomy  1976    with [facility] due to infection from [facility] IUD  . Breast lumpectomy      [id number], [phone]  . Mastectomy w/ nodes partial  2012    double mastectomy with nodes taken out on right side   . Colonoscopy w/ polypectomy  2006    negative 2011; Dr Carlean Purl  . Appendectomy  1976    @ [facility] & [facility]  . Tonsillectomy and adenoidectomy    . Placement of breast implants  04/2011    Dr Migdalia Dk, [facility]    Social History   Social History  . Marital Status: Married    Spouse  Name: N/A  . Number of Children: N/A  . Years of Education: N/A   Social History Main Topics  . Smoking status: Former Smoker    Quit date: 09/18/1977  . Smokeless tobacco: Never Used     Comment: smoked 1957-1979 , up to 1 ppd  . Alcohol Use: No     Comment:  very rarely  . Drug Use: No  . Sexual Activity: Not Currently    Birth Control/ Protection: Surgical   Other Topics Concern  . None   Social History Narrative    Review of Systems  Constitutional: Positive for fatigue (low energy). Negative for fever, chills, appetite change and unexpected weight change.       Occasional hot flash  Eyes: Negative for visual disturbance.  Respiratory: Negative for cough, shortness of breath and wheezing.   Cardiovascular: Negative for chest pain, palpitations and leg swelling.  Gastrointestinal: Negative for nausea, abdominal pain, diarrhea, constipation and blood in stool.       Infrequent GERD, occasional IBS symptoms  Genitourinary: Negative for dysuria and hematuria.  Musculoskeletal: Positive for arthralgias (knee arthritis).  Neurological: Positive for dizziness (occasional). Negative for weakness, light-headedness, numbness and headaches.  Psychiatric/Behavioral: Negative for dysphoric mood. The patient is not nervous/anxious.        Objective:   Filed Vitals:   08/04/15 0810  BP: 132/74  Pulse: 77  Temp: 97.9 F (36.6 C)  Resp: 16   Filed Weights   08/04/15 0810  Weight: 175 lb (79.379 kg)   Body mass index is 29.12 kg/(m^2).   Physical Exam Constitutional: She appears well-developed and well-nourished. No distress.  HENT:  Head: Normocephalic and atraumatic.  Right Ear: External ear normal.  Left Ear: External ear normal.  Mouth/Throat: Oropharynx is clear and moist.  Normal bilateral ear canals and tympanic membranes  Eyes: Conjunctivae and EOM are normal.  Neck: Neck supple. No tracheal deviation present. No thyromegaly present.  No carotid bruit    Cardiovascular: Normal rate, regular rhythm and normal heart sounds.   No murmur heard. Pulmonary/Chest: Effort normal and breath sounds normal. No respiratory distress. She has no wheezes. She has no rales.  Abdominal: Soft. She exhibits no distension. There is no tenderness.  Musculoskeletal: She exhibits no edema.  Lymphadenopathy:    She has no cervical adenopathy.  Skin: Skin is warm and dry. She is not diaphoretic.  Psychiatric: She has a normal mood and affect. Her behavior is normal.         Assessment & Plan:   Physical exam, wellness visit Screening blood work ordered Colonoscopy up-to-date Unable to a mammogram-status post bilateral mastectomy Immunizations up to date DEXA up-to-date Eye exams up-to-date Diet: heart healthy Physical activity: active but not exercising regularly  -stressed regular exercise Encouraged weight loss Depression/mood screen: negative Hearing: intact to whispered voice  Visual acuity: grossly normal, performs annual eye exam  ADLs: capable Fall risk: none Home safety: good Cognitive evaluation: intact to orientation, naming, recall and repetition EOL planning: adv directives discussed - up to date - she will try to bring in a copy   See Problem List for further assessment and plan/review of chronic medical problems

## 2015-08-04 NOTE — Assessment & Plan Note (Signed)
BP Readings from Last 3 Encounters:  08/04/15 132/74  06/18/15 136/82  04/15/15 137/75   BP well controlled Continue current medicaions

## 2015-08-04 NOTE — Assessment & Plan Note (Signed)
Not checking sugars regularly at home Last A1c around 7%, will check today Stressed increasing exercise and working on weight loss Continue metformin

## 2015-08-04 NOTE — Patient Instructions (Addendum)
We have reviewed your prior records including labs and tests today.  Test(s) ordered today. Your results will be released to [place] (or called to you) after review, usually within 72hours after test completion. If any changes need to be made, you will be notified at that same time.  All other Health Maintenance issues reviewed.   All recommended immunizations and age-appropriate screenings are up-to-date.  No immunizations administered today.   Medications reviewed and updated.  No changes recommended at this time.  Your prescription(s) have been submitted to your pharmacy. Please take as directed and contact our office if you believe you are having problem(s) with the medication(s).   Please followup annually   Health Maintenance, Female Adopting a healthy lifestyle and getting preventive care can go a long way to promote health and wellness. Talk with your health care provider about what schedule of regular examinations is right for you. This is a good chance for you to check in with your provider about disease prevention and staying healthy. In between checkups, there are plenty of things you can do on your own. Experts have done a lot of research about which lifestyle changes and preventive measures are most likely to keep you healthy. Ask your health care provider for more information. WEIGHT AND DIET  Eat a healthy diet  Be sure to include plenty of vegetables, fruits, low-fat dairy products, and lean protein.  Do not eat a lot of foods high in solid fats, added sugars, or salt.  Get regular exercise. This is one of the most important things you can do for your health.  Most adults should exercise for at least 150 minutes each week. The exercise should increase your heart rate and make you sweat (moderate-intensity exercise).  Most adults should also do strengthening exercises at least twice a week. This is in addition to the moderate-intensity exercise.  Maintain a healthy  weight  Body mass index (BMI) is a measurement that can be used to identify possible weight problems. It estimates body fat based on height and weight. Your health care provider can help determine your BMI and help you achieve or maintain a healthy weight.  For females 28 years of age and older:   A BMI below 18.5 is considered underweight.  A BMI of 18.5 to 24.9 is normal.  A BMI of 25 to 29.9 is considered overweight.  A BMI of 30 and above is considered obese.  Watch levels of cholesterol and blood lipids  You should start having your blood tested for lipids and cholesterol at 73 years of age, then have this test every 5 years.  You may need to have your cholesterol levels checked more often if:  Your lipid or cholesterol levels are high.  You are older than 73 years of age.  You are at high risk for heart disease.  CANCER SCREENING   Lung Cancer  Lung cancer screening is recommended for adults 83-7 years old who are at high risk for lung cancer because of a history of smoking.  A yearly low-dose CT scan of the lungs is recommended for people who:  Currently smoke.  Have quit within the past 15 years.  Have at least a 30-pack-year history of smoking. A pack year is smoking an average of one pack of cigarettes a day for 1 year.  Yearly screening should continue until it has been 15 years since you quit.  Yearly screening should stop if you develop a health problem that would prevent you  from having lung cancer treatment.  Breast Cancer  Practice breast self-awareness. This means understanding how your breasts normally appear and feel.  It also means doing regular breast self-exams. Let your health care provider know about any changes, no matter how small.  If you are in your 20s or 30s, you should have a clinical breast exam (CBE) by a health care provider every 1-3 years as part of a regular health exam.  If you are 34 or older, have a CBE every year. Also  consider having a breast X-ray (mammogram) every year.  If you have a family history of breast cancer, talk to your health care provider about genetic screening.  If you are at high risk for breast cancer, talk to your health care provider about having an MRI and a mammogram every year.  Breast cancer gene (BRCA) assessment is recommended for women who have family members with BRCA-related cancers. BRCA-related cancers include:  Breast.  Ovarian.  Tubal.  Peritoneal cancers.  Results of the assessment will determine the need for genetic counseling and BRCA1 and BRCA2 testing. Cervical Cancer Your health care provider may recommend that you be screened regularly for cancer of the pelvic organs (ovaries, uterus, and vagina). This screening involves a pelvic examination, including checking for microscopic changes to the surface of your cervix (Pap test). You may be encouraged to have this screening done every 3 years, beginning at age 75.  For women ages 27-65, health care providers may recommend pelvic exams and Pap testing every 3 years, or they may recommend the Pap and pelvic exam, combined with testing for human papilloma virus (HPV), every 5 years. Some types of HPV increase your risk of cervical cancer. Testing for HPV may also be done on women of any age with unclear Pap test results.  Other health care providers may not recommend any screening for nonpregnant women who are considered low risk for pelvic cancer and who do not have symptoms. Ask your health care provider if a screening pelvic exam is right for you.  If you have had past treatment for cervical cancer or a condition that could lead to cancer, you need Pap tests and screening for cancer for at least 20 years after your treatment. If Pap tests have been discontinued, your risk factors (such as having a new sexual partner) need to be reassessed to determine if screening should resume. Some women have medical problems that  increase the chance of getting cervical cancer. In these cases, your health care provider may recommend more frequent screening and Pap tests. Colorectal Cancer  This type of cancer can be detected and often prevented.  Routine colorectal cancer screening usually begins at 73 years of age and continues through 73 years of age.  Your health care provider may recommend screening at an earlier age if you have risk factors for colon cancer.  Your health care provider may also recommend using home test kits to check for hidden blood in the stool.  A small camera at the end of a tube can be used to examine your colon directly (sigmoidoscopy or colonoscopy). This is done to check for the earliest forms of colorectal cancer.  Routine screening usually begins at age 66.  Direct examination of the colon should be repeated every 5-10 years through 73 years of age. However, you may need to be screened more often if early forms of precancerous polyps or small growths are found. Skin Cancer  Check your skin from head to toe  regularly.  Tell your health care provider about any new moles or changes in moles, especially if there is a change in a mole's shape or color.  Also tell your health care provider if you have a mole that is larger than the size of a pencil eraser.  Always use sunscreen. Apply sunscreen liberally and repeatedly throughout the day.  Protect yourself by wearing long sleeves, pants, a wide-brimmed hat, and sunglasses whenever you are outside. HEART DISEASE, DIABETES, AND HIGH BLOOD PRESSURE   High blood pressure causes heart disease and increases the risk of stroke. High blood pressure is more likely to develop in:  People who have blood pressure in the high end of the normal range (130-139/85-89 mm Hg).  People who are overweight or obese.  People who are African American.  If you are 34-64 years of age, have your blood pressure checked every 3-5 years. If you are 86 years of  age or older, have your blood pressure checked every year. You should have your blood pressure measured twice--once when you are at a hospital or clinic, and once when you are not at a hospital or clinic. Record the average of the two measurements. To check your blood pressure when you are not at a hospital or clinic, you can use:  An automated blood pressure machine at a pharmacy.  A home blood pressure monitor.  If you are between 10 years and 69 years old, ask your health care provider if you should take aspirin to prevent strokes.  Have regular diabetes screenings. This involves taking a blood sample to check your fasting blood sugar level.  If you are at a normal weight and have a low risk for diabetes, have this test once every three years after 73 years of age.  If you are overweight and have a high risk for diabetes, consider being tested at a younger age or more often. PREVENTING INFECTION  Hepatitis B  If you have a higher risk for hepatitis B, you should be screened for this virus. You are considered at high risk for hepatitis B if:  You were born in a country where hepatitis B is common. Ask your health care provider which countries are considered high risk.  Your parents were born in a high-risk country, and you have not been immunized against hepatitis B (hepatitis B vaccine).  You have HIV or AIDS.  You use needles to inject street drugs.  You live with someone who has hepatitis B.  You have had sex with someone who has hepatitis B.  You get hemodialysis treatment.  You take certain medicines for conditions, including cancer, organ transplantation, and autoimmune conditions. Hepatitis C  Blood testing is recommended for:  Everyone born from [birth year] through 1964.  Anyone with known risk factors for hepatitis C. Sexually transmitted infections (STIs)  You should be screened for sexually transmitted infections (STIs) including gonorrhea and chlamydia if:  You are  sexually active and are younger than 73 years of age.  You are older than 73 years of age and your health care provider tells you that you are at risk for this type of infection.  Your sexual activity has changed since you were last screened and you are at an increased risk for chlamydia or gonorrhea. Ask your health care provider if you are at risk.  If you do not have HIV, but are at risk, it may be recommended that you take a prescription medicine daily to prevent HIV infection. This is  called pre-exposure prophylaxis (PrEP). You are considered at risk if:  You are sexually active and do not regularly use condoms or know the HIV status of your partner(s).  You take drugs by injection.  You are sexually active with a partner who has HIV. Talk with your health care provider about whether you are at high risk of being infected with HIV. If you choose to begin PrEP, you should first be tested for HIV. You should then be tested every 3 months for as long as you are taking PrEP.  PREGNANCY   If you are premenopausal and you may become pregnant, ask your health care provider about preconception counseling.  If you may become pregnant, take 400 to 800 micrograms (mcg) of folic acid every day.  If you want to prevent pregnancy, talk to your health care provider about birth control (contraception). OSTEOPOROSIS AND MENOPAUSE   Osteoporosis is a disease in which the bones lose minerals and strength with aging. This can result in serious bone fractures. Your risk for osteoporosis can be identified using a bone density scan.  If you are 23 years of age or older, or if you are at risk for osteoporosis and fractures, ask your health care provider if you should be screened.  Ask your health care provider whether you should take a calcium or vitamin D supplement to lower your risk for osteoporosis.  Menopause may have certain physical symptoms and risks.  Hormone replacement therapy may reduce some  of these symptoms and risks. Talk to your health care provider about whether hormone replacement therapy is right for you.  HOME CARE INSTRUCTIONS   Schedule regular health, dental, and eye exams.  Stay current with your immunizations.   Do not use any tobacco products including cigarettes, chewing tobacco, or electronic cigarettes.  If you are pregnant, do not drink alcohol.  If you are breastfeeding, limit how much and how often you drink alcohol.  Limit alcohol intake to no more than 1 drink per day for nonpregnant women. One drink equals 12 ounces of beer, 5 ounces of wine, or 1 ounces of hard liquor.  Do not use street drugs.  Do not share needles.  Ask your health care provider for help if you need support or information about quitting drugs.  Tell your health care provider if you often feel depressed.  Tell your health care provider if you have ever been abused or do not feel safe at home.   This information is not intended to replace advice given to you by your health care provider. Make sure you discuss any questions you have with your health care provider.   Document Released: 03/20/2011 Document Revised: 09/25/2014 Document Reviewed: 08/06/2013 Elsevier Interactive Patient Education [employer].

## 2015-08-04 NOTE — Assessment & Plan Note (Signed)
Occasional GERD Taking Protonix or Zantac only as needed Does not require daily medication at this time

## 2015-08-04 NOTE — Progress Notes (Signed)
Pre visit review using our clinic review tool, if applicable. No additional management support is needed unless otherwise documented below in the visit note. 

## 2015-08-04 NOTE — Assessment & Plan Note (Signed)
Has not tolerated statins-cause elevation of LFTs Taking omega-3 Advised increasing exercise and working on weight loss Eats a heart healthy diet.

## 2015-08-05 ENCOUNTER — Encounter: Payer: Self-pay | Admitting: Internal Medicine

## 2015-08-05 DIAGNOSIS — K760 Fatty (change of) liver, not elsewhere classified: Secondary | ICD-10-CM

## 2015-08-05 HISTORY — DX: Fatty (change of) liver, not elsewhere classified: K76.0

## 2015-08-07 LAB — VITAMIN D 1,25 DIHYDROXY
VITAMIN D 1, 25 (OH) TOTAL: 62 pg/mL (ref 18–72)
Vitamin D2 1, 25 (OH)2: <8 pg/mL
Vitamin D3 1, 25 (OH)2: 62 pg/mL

## 2015-08-09 ENCOUNTER — Encounter: Payer: Self-pay | Admitting: Internal Medicine

## 2015-08-16 ENCOUNTER — Encounter: Payer: Self-pay | Admitting: Hematology and Oncology

## 2015-08-16 ENCOUNTER — Telehealth: Payer: Self-pay | Admitting: Hematology and Oncology

## 2015-08-16 ENCOUNTER — Ambulatory Visit (HOSPITAL_BASED_OUTPATIENT_CLINIC_OR_DEPARTMENT_OTHER): Payer: Medicare Other | Admitting: Hematology and Oncology

## 2015-08-16 VITALS — BP 168/71 | HR 79 | Temp 97.9°F | Resp 18 | Ht 65.0 in | Wt 174.3 lb

## 2015-08-16 DIAGNOSIS — Z853 Personal history of malignant neoplasm of breast: Secondary | ICD-10-CM | POA: Diagnosis not present

## 2015-08-16 DIAGNOSIS — C50111 Malignant neoplasm of central portion of right female breast: Secondary | ICD-10-CM

## 2015-08-16 NOTE — Telephone Encounter (Signed)
Gave and pritned appt sched and avs for pt for NOV 2017 °

## 2015-08-16 NOTE — Assessment & Plan Note (Signed)
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 Migdalia Dk( left breast showing fibrocystic changes) took antiestrogen therapy for 15 months discontinued due to intolerance. (Genetic testing for BRCA1 and 2 negative 12/16/2010)  Breast Cancer Surveillance: 1. Breast/ chest exam : Normal 2. Mammogram : No role of mammogram since she had bilateral mastectomies  Return to clinic in 1 year for survivorship clinic

## 2015-08-16 NOTE — Progress Notes (Signed)
Patient Care Team: Hendricks Limes, MD as PCP - General  DIAGNOSIS: No matching staging information was found for the patient.  SUMMARY OF ONCOLOGIC HISTORY:   Cancer of central portion of right female breast ([place])   07/18/2010 Initial Diagnosis Right breast biopsy: DCIS grade 2, ER 100%, PR 0%   10/12/2010 Surgery Bilateral mastectomies for a stage I, pT1b pN0, 0.9 cm invasive ductal carcinoma of the right breast, grade 2 with high-grade ductal carcinoma in situ, surgical margins negative for carcinoma, ER 94% PR negative HER-2 negative   11/08/2010 - 03/22/2012 Anti-estrogen oral therapy Antiestrogen therapy with Aromasin changed to Arimidex but could not tolerate   12/16/2010 Procedure Genetic testing was negative for BRCA1 and BRCA2 mutations    Surgery Bilateral breast reconstruction by Dr. Migdalia Dk    CHIEF COMPLIANT: annual follow-up of right breast cancer  INTERVAL HISTORY: [name] is a 74 year old with above-mentioned history of right breast cancer for which she underwent bilateral mastectomies. She could not tolerate oral antiestrogen therapy. She had bilateral breast reconstruction. She reports no new problems or concerns with the breast or axilla. Denies any lumps or nodules. Her health has been excellent.  REVIEW OF SYSTEMS:   Constitutional: Denies fevers, chills or abnormal weight loss Eyes: Denies blurriness of vision Ears, nose, mouth, throat, and face: Denies mucositis or sore throat Respiratory: Denies cough, dyspnea or wheezes Cardiovascular: Denies palpitation, chest discomfort or lower extremity swelling Gastrointestinal:  Denies nausea, heartburn or change in bowel habits Skin: Denies abnormal skin rashes Lymphatics: Denies new lymphadenopathy or easy bruising Neurological:Denies numbness, tingling or new weaknesses Behavioral/Psych: Mood is stable, no new changes  Breast:  denies any pain or lumps or nodules in either reconstructed breasts All other  systems were reviewed with the patient and are negative.  I have reviewed the past medical history, past surgical history, social history and family history with the patient and they are unchanged from previous note.  ALLERGIES:  is allergic to sulfur; exemestane; morphine and related; statins; ace inhibitors; and codeine.  MEDICATIONS:  Current Outpatient Prescriptions  Medication Sig Dispense Refill  . cloNIDine (CATAPRES) 0.2 MG tablet Take 1 tablet (0.2 mg total) by mouth 2 (two) times daily. 180 tablet 2  . losartan-hydrochlorothiazide (HYZAAR) 100-12.5 MG tablet Take 1 tablet by mouth daily. 90 tablet 2  . metFORMIN (GLUCOPHAGE) 500 MG tablet TAKE  (1)  TABLET TWICE A DAY WITH MEALS (BREAKFAST AND SUPPER) 180 tablet 2  . Multiple Vitamin (MULTIVITAMIN) capsule Take 1 capsule by mouth daily.      . Omega-3 Fatty Acids (FISH OIL CONCENTRATE) 1000 MG CAPS Take by mouth 2 (two) times daily.     Marland Kitchen oxybutynin (OXYTROL) 3.9 MG/24HR Place 1 patch onto the skin 2 (two) times a week.      . pantoprazole (PROTONIX) 40 MG tablet TAKE 1 TABLET 30 MINUTES BEFORE A MEAL. 90 tablet 2  . Probiotic Product (ALIGN) 4 MG CAPS Take by mouth as needed.     . ranitidine (ZANTAC) 150 MG tablet Take 150 mg by mouth 2 (two) times daily.     No current facility-administered medications for this visit.    PHYSICAL EXAMINATION: ECOG PERFORMANCE STATUS: 0 - Asymptomatic  Filed Vitals:   08/16/15 0959  BP: 168/71  Pulse: 79  Temp: 97.9 F (36.6 C)  Resp: 18   Filed Weights   08/16/15 0959  Weight: 174 lb 4.8 oz (79.062 kg)    GENERAL:alert, no distress and comfortable SKIN:  skin color, texture, turgor are normal, no rashes or significant lesions EYES: normal, Conjunctiva are pink and non-injected, sclera clear OROPHARYNX:no exudate, no erythema and lips, buccal mucosa, and tongue normal  NECK: supple, thyroid normal size, non-tender, without nodularity LYMPH:  no palpable lymphadenopathy in the  cervical, axillary or inguinal LUNGS: clear to auscultation and percussion with normal breathing effort HEART: regular rate & rhythm and no murmurs and no lower extremity edema ABDOMEN:abdomen soft, non-tender and normal bowel sounds Musculoskeletal:no cyanosis of digits and no clubbing  NEURO: alert & oriented x 3 with fluent speech, no focal motor/sensory deficits BREAST:no palpable lumps or nodules in reconstructed breast or axilla. (exam performed in the presence of a chaperone)  LABORATORY DATA:  I have reviewed the data as listed   Chemistry      Component Value Date/Time   NA 141 08/04/2015 0955   NA 144 01/27/2014 1011   K 4.4 08/04/2015 0955   K 3.6 01/27/2014 1011   CL 105 08/04/2015 0955   CL 106 12/25/2012 0827   CO2 28 08/04/2015 0955   CO2 26 01/27/2014 1011   BUN 19 08/04/2015 0955   BUN 13.1 01/27/2014 1011   CREATININE 1.00 08/04/2015 0955   CREATININE 1.1 01/27/2014 1011      Component Value Date/Time   CALCIUM 10.1 08/04/2015 0955   CALCIUM 10.3 01/27/2014 1011   ALKPHOS 61 08/04/2015 0955   ALKPHOS 63 01/27/2014 1011   AST 43* 08/04/2015 0955   AST 31 01/27/2014 1011   ALT 54* 08/04/2015 0955   ALT 45 01/27/2014 1011   BILITOT 1.0 08/04/2015 0955   BILITOT 0.97 01/27/2014 1011       Lab Results  Component Value Date   WBC 6.7 08/04/2015   HGB 13.9 08/04/2015   HCT 41.0 08/04/2015   MCV 86.4 08/04/2015   PLT 249.0 08/04/2015   NEUTROABS 3.7 08/04/2015    ASSESSMENT & PLAN:  Cancer of central portion of right female breast 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 Migdalia Dk( left breast showing fibrocystic changes) took antiestrogen therapy for 15 months discontinued due to intolerance. (Genetic testing for BRCA1 and 2 negative 12/16/2010)  Breast Cancer Surveillance: 1.  Breast/ chest exam : Normal 2. Mammogram : No role of mammogram since she had bilateral mastectomies  Return to clinic in 1 year for follow up  No orders of the defined types were placed in this encounter.   The patient has a good understanding of the overall plan. she agrees with it. she will call with any problems that may develop before the next visit here.   Rulon Eisenmenger, MD 08/16/2015

## 2015-08-16 NOTE — Addendum Note (Signed)
Addended by: Prentiss Bells on: 08/16/2015 05:25 PM   Modules accepted: Medications

## 2016-01-05 LAB — HM DEXA SCAN

## 2016-01-06 ENCOUNTER — Encounter: Payer: Self-pay | Admitting: Internal Medicine

## 2016-01-09 ENCOUNTER — Encounter: Payer: Self-pay | Admitting: Internal Medicine

## 2016-01-19 ENCOUNTER — Other Ambulatory Visit: Payer: Self-pay | Admitting: Internal Medicine

## 2016-02-03 ENCOUNTER — Encounter: Payer: Self-pay | Admitting: Internal Medicine

## 2016-02-03 LAB — HM DIABETES EYE EXAM

## 2016-03-31 ENCOUNTER — Telehealth: Payer: Self-pay | Admitting: Emergency Medicine

## 2016-03-31 NOTE — Telephone Encounter (Signed)
Pt and her husband need to set up AWV sometime after their CPE. Can you take a look at it and give them a call thanks.

## 2016-04-03 ENCOUNTER — Telehealth: Payer: Self-pay

## 2016-04-03 NOTE — Telephone Encounter (Signed)
Call back to schedule AWV for 11/6 p apt with dr. [name]; Spouse coming in at 10:30; Spouse is coming in with wife; will call back with  Confirmation of schedule to [phone]

## 2016-04-12 ENCOUNTER — Telehealth: Payer: Self-pay

## 2016-04-12 NOTE — Telephone Encounter (Signed)
Call rec'd from Mrs. Girdler to schedule AWV. Apt with Dr. [name] the first of Nov; her and spouse have very few needs; To defer AWV to Dr. [name] unless there are new needs at which point Dr. [name] will refer for AWV.  Also, this patient has had double mastectomy in which both breast were removed to the chest wall and implants were inserted. States  both her oncologist and plastic surgeon do not feel a mammogram is necessary. The patient declines as well;   Can we remove this or defer mammogram?  Please advise or update over due HM Tks

## 2016-04-13 NOTE — Telephone Encounter (Signed)
mammo removed from [place] for me to do wellness - will do when she comes in for her CPE in November  Thanks susan

## 2016-07-22 NOTE — Progress Notes (Signed)
She has leess energy   Subjective:    Patient ID: [name], female    DOB: [birth year], 75 y.o.   MRN: [id number]  HPI She is here for a physical exam.   She has had less energy that she has had in the past.  She is currently not exercising - she was having hip issues and has not been able to exercise for a few months.  She can now exercise and plans on restarting.   She did nutrisystem.  She did lose weight.  She stopped doing it - can no longer afford to do it.   She wants to continue her weight loss efforts.  Her BP at home is typically 135/70's.  She has not checked it recently.  She is taking her medication every day.    Diabetes: She is taking her medication daily as prescribed. She is compliant with a diabetic diet. She is not exercising regularly, but will stop. She monitors her sugars and they have been running 140's on average, 100-150. She checks her feet daily and denies foot lesions. She is up-to-date with an ophthalmology examination.     Medications and allergies reviewed with patient and updated if appropriate.  Patient Active Problem List   Diagnosis Date Noted  . Fatty liver 08/05/2015  . History of breast cancer 08/04/2015  . S/P bilateral mastectomy 08/04/2015  . Osteoarthritis of left knee 08/04/2015  . Hypercalcemia 04/17/2015  . Dyslipidemia 04/15/2015  . Elevated LFTs 08/10/2014  . Osteopenia 08/31/2013  . Transfusion history 08/18/2013  . IBS (irritable bowel syndrome) 08/18/2013  . 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 History of colonic polyps 06/06/2010  . Essential hypertension 02/16/2009  . Diabetes type 2, controlled ([place]) 08/11/2008  . Esophageal reflux 05/06/2008  . GILBERT'S SYNDROME 02/06/2007    Current Outpatient Prescriptions on File Prior to Visit  Medication Sig Dispense Refill  . Multiple Vitamin (MULTIVITAMIN) capsule Take 1 capsule by mouth daily.      . Omega-3 Fatty Acids (FISH OIL  CONCENTRATE) 1000 MG CAPS Take by mouth 2 (two) times daily.     Marland Kitchen oxybutynin (OXYTROL) 3.9 MG/24HR Place 1 patch onto the skin 2 (two) times a week.      . Probiotic Product (ALIGN) 4 MG CAPS Take by mouth as needed.     . ranitidine (ZANTAC) 150 MG tablet Take 150 mg by mouth 2 (two) times daily.     No current facility-administered medications on file prior to visit.     Past Medical History:  Diagnosis Date  . Breast cancer ([place]) 2012  . Diabetes mellitus   . GERD (gastroesophageal reflux disease)   . Gilbert syndrome   . Hypertension   . IBS (irritable bowel syndrome)   . Plantar fasciitis of right foot    Dr Oneta Rack  . Transfusion history 1976    Past Surgical History:  Procedure Laterality Date  . ABDOMINAL HYSTERECTOMY  1976   with BSO due to infection from [facility] IUD  . APPENDECTOMY  1976   @ [place]     [id number], [phone]  . COLONOSCOPY W/ POLYPECTOMY  2006   negative 2011; Dr Carlean Purl  . MASTECTOMY W/ NODES PARTIAL  2012   double mastectomy with nodes taken out on right side   . PLACEMENT OF BREAST IMPLANTS  04/2011   Dr Migdalia Dk, [facility]  . TONSILLECTOMY AND ADENOIDECTOMY      Social  History   Social History  . Marital status: Married    Spouse name: N/A  . Number of children: N/A  . Years of education: N/A   Social History Main Topics  . Smoking status: Former Smoker    Quit date: 09/18/1977  . Smokeless tobacco: Never Used     Comment: smoked 1957-1979 , up to 1 ppd  . Alcohol use No     Comment:  very rarely  . Drug use: No  . Sexual activity: Not Currently    Birth control/ protection: Surgical   Other Topics Concern  . None   Social History Narrative   No regular exercise - will start    Family History  Problem Relation Age of Onset  . Heart attack Father 24  . Breast cancer Sister 40     bilateral   . Heart failure Sister     PMH intensive chemotherapy  . Hypertension Sister   . Stroke Sister  69  . Breast cancer Maternal Aunt      2 M aunts (40, 53)  . Diabetes Maternal Grandmother     Review of Systems  Constitutional: Positive for fatigue (low energy level). Negative for chills, diaphoresis, fever and unexpected weight change (intentional weight loss).       Hot flashes  HENT: Negative for hearing loss and tinnitus.   Eyes: Negative for visual disturbance.  Respiratory: Negative for cough, shortness of breath and wheezing.   Cardiovascular: Negative for chest pain, palpitations and leg swelling.  Gastrointestinal: Positive for diarrhea (IBS related only). Negative for abdominal pain, blood in stool and nausea.  Genitourinary: Negative for dysuria and hematuria.  Musculoskeletal: Negative for arthralgias and back pain.  Skin: Negative for color change and rash.  Neurological: Positive for dizziness (occ - idiopathic, meclizine as needed). Negative for light-headedness and headaches.  Psychiatric/Behavioral: Negative for dysphoric mood. The patient is not nervous/anxious.        Objective:   Vitals:   07/24/16 0827  BP: (!) 160/92  Pulse: 99  Resp: 16  Temp: 98.3 F (36.8 C)   Filed Weights   07/24/16 0827  Weight: 168 lb (76.2 kg)   Body mass index is 27.96 kg/m.   Physical Exam Constitutional: She appears well-developed and well-nourished. No distress.  HENT:  Head: Normocephalic and atraumatic.  Right Ear: External ear normal. Normal ear canal and TM Left Ear: External ear normal.  Normal ear canal and TM Mouth/Throat: Oropharynx is clear and moist.  Eyes: Conjunctivae and EOM are normal.  Neck: Neck supple. No tracheal deviation present. No thyromegaly present.  No carotid bruit  Cardiovascular: Normal rate, regular rhythm and normal heart sounds.   No murmur heard.  No edema. Pulmonary/Chest: Effort normal and breath sounds normal. No respiratory distress. She has no wheezes. She has no rales.  Breast: deferred to Gyn Abdominal: Soft. She exhibits  no distension. There is no tenderness.  Lymphadenopathy: She has no cervical adenopathy.  Skin: Skin is warm and dry. She is not diaphoretic.  Psychiatric: She has a normal mood and affect. Her behavior is normal.         Assessment & Plan:   Physical exam: Screening blood work  Ordered - rule out other causes for decreased energy Immunizations  Flu vaccine given today, other up to date Colonoscopy  - Up to date  Mammogram  - s/p b/l mastectomy - not needed Gyn - no longer sees gyn - sees oncology Dexa   Up to  date  Eye exams  Up to date  Exercise - not regularly, but will start - now able exercise  Weight - has lost weight, working on weight loss Skin - saw dermatology in June - normal skin check Substance abuse - none  See Problem List for Assessment and Plan of chronic medical problems.  Blood work in 6 months, f/u with me in one year

## 2016-07-22 NOTE — Patient Instructions (Addendum)
Test(s) ordered today. Your results will be released to [place] (or called to you) after review, usually within 72hours after test completion. If any changes need to be made, you will be notified at that same time.  All other Health Maintenance issues reviewed.   All recommended immunizations and age-appropriate screenings are up-to-date or discussed.  Flu  immunization administered today.   Medications reviewed and updated.  No changes recommended at this time.   Please followup in one year  Health Maintenance, Female Adopting a healthy lifestyle and getting preventive care can go a long way to promote health and wellness. Talk with your health care provider about what schedule of regular examinations is right for you. This is a good chance for you to check in with your provider about disease prevention and staying healthy. In between checkups, there are plenty of things you can do on your own. Experts have done a lot of research about which lifestyle changes and preventive measures are most likely to keep you healthy. Ask your health care provider for more information. WEIGHT AND DIET  Eat a healthy diet  Be sure to include plenty of vegetables, fruits, low-fat dairy products, and lean protein.  Do not eat a lot of foods high in solid fats, added sugars, or salt.  Get regular exercise. This is one of the most important things you can do for your health.  Most adults should exercise for at least 150 minutes each week. The exercise should increase your heart rate and make you sweat (moderate-intensity exercise).  Most adults should also do strengthening exercises at least twice a week. This is in addition to the moderate-intensity exercise.  Maintain a healthy weight  Body mass index (BMI) is a measurement that can be used to identify possible weight problems. It estimates body fat based on height and weight. Your health care provider can help determine your BMI and help you achieve or  maintain a healthy weight.  For females 31 years of age and older:   A BMI below 18.5 is considered underweight.  A BMI of 18.5 to 24.9 is normal.  A BMI of 25 to 29.9 is considered overweight.  A BMI of 30 and above is considered obese.  Watch levels of cholesterol and blood lipids  You should start having your blood tested for lipids and cholesterol at 74 years of age, then have this test every 5 years.  You may need to have your cholesterol levels checked more often if:  Your lipid or cholesterol levels are high.  You are older than 74 years of age.  You are at high risk for heart disease.  CANCER SCREENING   Lung Cancer  Lung cancer screening is recommended for adults 61-32 years old who are at high risk for lung cancer because of a history of smoking.  A yearly low-dose CT scan of the lungs is recommended for people who:  Currently smoke.  Have quit within the past 15 years.  Have at least a 30-pack-year history of smoking. A pack year is smoking an average of one pack of cigarettes a day for 1 year.  Yearly screening should continue until it has been 15 years since you quit.  Yearly screening should stop if you develop a health problem that would prevent you from having lung cancer treatment.  Breast Cancer  Practice breast self-awareness. This means understanding how your breasts normally appear and feel.  It also means doing regular breast self-exams. Let your health care provider  know about any changes, no matter how small.  If you are in your 20s or 30s, you should have a clinical breast exam (CBE) by a health care provider every 1-3 years as part of a regular health exam.  If you are 41 or older, have a CBE every year. Also consider having a breast X-ray (mammogram) every year.  If you have a family history of breast cancer, talk to your health care provider about genetic screening.  If you are at high risk for breast cancer, talk to your health care  provider about having an MRI and a mammogram every year.  Breast cancer gene (BRCA) assessment is recommended for women who have family members with BRCA-related cancers. BRCA-related cancers include:  Breast.  Ovarian.  Tubal.  Peritoneal cancers.  Results of the assessment will determine the need for genetic counseling and BRCA1 and BRCA2 testing. Cervical Cancer Your health care provider may recommend that you be screened regularly for cancer of the pelvic organs (ovaries, uterus, and vagina). This screening involves a pelvic examination, including checking for microscopic changes to the surface of your cervix (Pap test). You may be encouraged to have this screening done every 3 years, beginning at age 85.  For women ages 41-65, health care providers may recommend pelvic exams and Pap testing every 3 years, or they may recommend the Pap and pelvic exam, combined with testing for human papilloma virus (HPV), every 5 years. Some types of HPV increase your risk of cervical cancer. Testing for HPV may also be done on women of any age with unclear Pap test results.  Other health care providers may not recommend any screening for nonpregnant women who are considered low risk for pelvic cancer and who do not have symptoms. Ask your health care provider if a screening pelvic exam is right for you.  If you have had past treatment for cervical cancer or a condition that could lead to cancer, you need Pap tests and screening for cancer for at least 20 years after your treatment. If Pap tests have been discontinued, your risk factors (such as having a new sexual partner) need to be reassessed to determine if screening should resume. Some women have medical problems that increase the chance of getting cervical cancer. In these cases, your health care provider may recommend more frequent screening and Pap tests. Colorectal Cancer  This type of cancer can be detected and often prevented.  Routine  colorectal cancer screening usually begins at 74 years of age and continues through 74 years of age.  Your health care provider may recommend screening at an earlier age if you have risk factors for colon cancer.  Your health care provider may also recommend using home test kits to check for hidden blood in the stool.  A small camera at the end of a tube can be used to examine your colon directly (sigmoidoscopy or colonoscopy). This is done to check for the earliest forms of colorectal cancer.  Routine screening usually begins at age 57.  Direct examination of the colon should be repeated every 5-10 years through 74 years of age. However, you may need to be screened more often if early forms of precancerous polyps or small growths are found. Skin Cancer  Check your skin from head to toe regularly.  Tell your health care provider about any new moles or changes in moles, especially if there is a change in a mole's shape or color.  Also tell your health care provider if  have a mole that is larger than the size of a pencil eraser.  Always use sunscreen. Apply sunscreen liberally and repeatedly throughout the day.  Protect yourself by wearing long sleeves, pants, a wide-brimmed hat, and sunglasses whenever you are outside. HEART DISEASE, DIABETES, AND HIGH BLOOD PRESSURE   High blood pressure causes heart disease and increases the risk of stroke. High blood pressure is more likely to develop in:  People who have blood pressure in the high end of the normal range (130-139/85-89 mm Hg).  People who are overweight or obese.  People who are African American.  If you are 18-39 years of age, have your blood pressure checked every 3-5 years. If you are 40 years of age or older, have your blood pressure checked every year. You should have your blood pressure measured twice--once when you are at a hospital or clinic, and once when you are not at a hospital or clinic. Record the average of the  two measurements. To check your blood pressure when you are not at a hospital or clinic, you can use:  An automated blood pressure machine at a pharmacy.  A home blood pressure monitor.  If you are between 55 years and 79 years old, ask your health care provider if you should take aspirin to prevent strokes.  Have regular diabetes screenings. This involves taking a blood sample to check your fasting blood sugar level.  If you are at a normal weight and have a low risk for diabetes, have this test once every three years after 74 years of age.  If you are overweight and have a high risk for diabetes, consider being tested at a younger age or more often. PREVENTING INFECTION  Hepatitis B  If you have a higher risk for hepatitis B, you should be screened for this virus. You are considered at high risk for hepatitis B if:  You were born in a country where hepatitis B is common. Ask your health care provider which countries are considered high risk.  Your parents were born in a high-risk country, and you have not been immunized against hepatitis B (hepatitis B vaccine).  You have HIV or AIDS.  You use needles to inject street drugs.  You live with someone who has hepatitis B.  You have had sex with someone who has hepatitis B.  You get hemodialysis treatment.  You take certain medicines for conditions, including cancer, organ transplantation, and autoimmune conditions. Hepatitis C  Blood testing is recommended for:  Everyone born from 1945 through 1965.  Anyone with known risk factors for hepatitis C. Sexually transmitted infections (STIs)  You should be screened for sexually transmitted infections (STIs) including gonorrhea and chlamydia if:  You are sexually active and are younger than 74 years of age.  You are older than 74 years of age and your health care provider tells you that you are at risk for this type of infection.  Your sexual activity has changed since you were  last screened and you are at an increased risk for chlamydia or gonorrhea. Ask your health care provider if you are at risk.  If you do not have HIV, but are at risk, it may be recommended that you take a prescription medicine daily to prevent HIV infection. This is called pre-exposure prophylaxis (PrEP). You are considered at risk if:  You are sexually active and do not regularly use condoms or know the HIV status of your partner(s).  You take drugs by injection.    You are sexually active with a partner who has HIV. Talk with your health care provider about whether you are at high risk of being infected with HIV. If you choose to begin PrEP, you should first be tested for HIV. You should then be tested every 3 months for as long as you are taking PrEP.  PREGNANCY   If you are premenopausal and you may become pregnant, ask your health care provider about preconception counseling.  If you may become pregnant, take 400 to 800 micrograms (mcg) of folic acid every day.  If you want to prevent pregnancy, talk to your health care provider about birth control (contraception). OSTEOPOROSIS AND MENOPAUSE   Osteoporosis is a disease in which the bones lose minerals and strength with aging. This can result in serious bone fractures. Your risk for osteoporosis can be identified using a bone density scan.  If you are 80 years of age or older, or if you are at risk for osteoporosis and fractures, ask your health care provider if you should be screened.  Ask your health care provider whether you should take a calcium or vitamin D supplement to lower your risk for osteoporosis.  Menopause may have certain physical symptoms and risks.  Hormone replacement therapy may reduce some of these symptoms and risks. Talk to your health care provider about whether hormone replacement therapy is right for you.  HOME CARE INSTRUCTIONS   Schedule regular health, dental, and eye exams.  Stay current with your  immunizations.   Do not use any tobacco products including cigarettes, chewing tobacco, or electronic cigarettes.  If you are pregnant, do not drink alcohol.  If you are breastfeeding, limit how much and how often you drink alcohol.  Limit alcohol intake to no more than 1 drink per day for nonpregnant women. One drink equals 12 ounces of beer, 5 ounces of wine, or 1 ounces of hard liquor.  Do not use street drugs.  Do not share needles.  Ask your health care provider for help if you need support or information about quitting drugs.  Tell your health care provider if you often feel depressed.  Tell your health care provider if you have ever been abused or do not feel safe at home.   This information is not intended to replace advice given to you by your health care provider. Make sure you discuss any questions you have with your health care provider.   Document Released: 03/20/2011 Document Revised: 09/25/2014 Document Reviewed: 08/06/2013 Elsevier Interactive Patient Education [employer].

## 2016-07-24 ENCOUNTER — Encounter: Payer: Self-pay | Admitting: Internal Medicine

## 2016-07-24 ENCOUNTER — Other Ambulatory Visit (INDEPENDENT_AMBULATORY_CARE_PROVIDER_SITE_OTHER): Payer: Medicare Other

## 2016-07-24 ENCOUNTER — Ambulatory Visit (INDEPENDENT_AMBULATORY_CARE_PROVIDER_SITE_OTHER): Payer: Medicare Other | Admitting: Internal Medicine

## 2016-07-24 VITALS — BP 160/92 | HR 99 | Temp 98.3°F | Resp 16 | Ht 65.0 in | Wt 168.0 lb

## 2016-07-24 DIAGNOSIS — Z0000 Encounter for general adult medical examination without abnormal findings: Secondary | ICD-10-CM | POA: Diagnosis not present

## 2016-07-24 DIAGNOSIS — M8580 Other specified disorders of bone density and structure, unspecified site: Secondary | ICD-10-CM

## 2016-07-24 DIAGNOSIS — E119 Type 2 diabetes mellitus without complications: Secondary | ICD-10-CM | POA: Diagnosis not present

## 2016-07-24 DIAGNOSIS — R7989 Other specified abnormal findings of blood chemistry: Secondary | ICD-10-CM

## 2016-07-24 DIAGNOSIS — Z23 Encounter for immunization: Secondary | ICD-10-CM

## 2016-07-24 DIAGNOSIS — K219 Gastro-esophageal reflux disease without esophagitis: Secondary | ICD-10-CM | POA: Diagnosis not present

## 2016-07-24 DIAGNOSIS — K580 Irritable bowel syndrome with diarrhea: Secondary | ICD-10-CM

## 2016-07-24 DIAGNOSIS — R945 Abnormal results of liver function studies: Secondary | ICD-10-CM

## 2016-07-24 DIAGNOSIS — E785 Hyperlipidemia, unspecified: Secondary | ICD-10-CM

## 2016-07-24 DIAGNOSIS — Z853 Personal history of malignant neoplasm of breast: Secondary | ICD-10-CM

## 2016-07-24 DIAGNOSIS — I10 Essential (primary) hypertension: Secondary | ICD-10-CM

## 2016-07-24 LAB — CBC WITH DIFFERENTIAL/PLATELET
BASOS ABS: 0 10*3/uL (ref 0.0–0.1)
BASOS PCT: 0.4 % (ref 0.0–3.0)
EOS ABS: 0.1 10*3/uL (ref 0.0–0.7)
Eosinophils Relative: 1.7 % (ref 0.0–5.0)
HEMATOCRIT: 42.2 % (ref 36.0–46.0)
Hemoglobin: 14.6 g/dL (ref 12.0–15.0)
LYMPHS ABS: 1.9 10*3/uL (ref 0.7–4.0)
LYMPHS PCT: 29.9 % (ref 12.0–46.0)
MCHC: 34.7 g/dL (ref 30.0–36.0)
MCV: 85.9 fl (ref 78.0–100.0)
MONOS PCT: 10.5 % (ref 3.0–12.0)
Monocytes Absolute: 0.7 10*3/uL (ref 0.1–1.0)
NEUTROS ABS: 3.7 10*3/uL (ref 1.4–7.7)
NEUTROS PCT: 57.5 % (ref 43.0–77.0)
PLATELETS: 247 10*3/uL (ref 150.0–400.0)
RBC: 4.91 Mil/uL (ref 3.87–5.11)
RDW: 13.3 % (ref 11.5–15.5)
WBC: 6.4 10*3/uL (ref 4.0–10.5)

## 2016-07-24 LAB — COMPREHENSIVE METABOLIC PANEL
ALT: 54 U/L — ABNORMAL HIGH (ref 0–35)
AST: 36 U/L (ref 0–37)
Albumin: 4.6 g/dL (ref 3.5–5.2)
Alkaline Phosphatase: 61 U/L (ref 39–117)
BUN: 19 mg/dL (ref 6–23)
CALCIUM: 10.1 mg/dL (ref 8.4–10.5)
CHLORIDE: 103 meq/L (ref 96–112)
CO2: 28 meq/L (ref 19–32)
Creatinine, Ser: 1.03 mg/dL (ref 0.40–1.20)
GFR: 55.55 mL/min — AB (ref >60.00)
GLUCOSE: 147 mg/dL — AB (ref 70–99)
Potassium: 3.9 mEq/L (ref 3.5–5.1)
Sodium: 142 mEq/L (ref 135–145)
Total Bilirubin: 1.2 mg/dL (ref 0.2–1.2)
Total Protein: 7.5 g/dL (ref 6.0–8.3)

## 2016-07-24 LAB — LDL CHOLESTEROL, DIRECT: LDL DIRECT: 130 mg/dL

## 2016-07-24 LAB — LIPID PANEL
CHOL/HDL RATIO: 5
Cholesterol: 192 mg/dL (ref 0–200)
HDL: 42.7 mg/dL (ref >39.00)
NONHDL: 149.78
TRIGLYCERIDES: 241 mg/dL — AB (ref 0.0–149.0)
VLDL: 48.2 mg/dL — AB (ref 0.0–40.0)

## 2016-07-24 LAB — MICROALBUMIN / CREATININE URINE RATIO
Creatinine,U: 81.7 mg/dL
MICROALB/CREAT RATIO: 0.9 mg/g (ref 0.0–30.0)
Microalb, Ur: <0.7 mg/dL (ref 0.0–1.9)

## 2016-07-24 LAB — HEMOGLOBIN A1C: Hgb A1c MFr Bld: 6.6 % — ABNORMAL HIGH (ref 4.6–6.5)

## 2016-07-24 LAB — TSH: TSH: 2.8 u[IU]/mL (ref 0.35–4.50)

## 2016-07-24 LAB — VITAMIN D 25 HYDROXY (VIT D DEFICIENCY, FRACTURES): VITD: 68.3 ng/mL (ref 30.00–100.00)

## 2016-07-24 MED ORDER — METFORMIN HCL 500 MG PO TABS: ORAL_TABLET | ORAL | 3 refills | Status: DC

## 2016-07-24 MED ORDER — CLONIDINE HCL 0.2 MG PO TABS: 0.2000 mg | ORAL_TABLET | Freq: Two times a day (BID) | ORAL | 3 refills | Status: DC

## 2016-07-24 MED ORDER — LOSARTAN POTASSIUM-HCTZ 100-12.5 MG PO TABS: 1.0000 | ORAL_TABLET | Freq: Every day | ORAL | 3 refills | Status: DC

## 2016-07-24 NOTE — Assessment & Plan Note (Signed)
Known fatty liver Work on weight loss cmp

## 2016-07-24 NOTE — Assessment & Plan Note (Signed)
Taking zantac as needed only GERD overall controlled continue

## 2016-07-24 NOTE — Assessment & Plan Note (Signed)
dexa up to date Taking calcium and vitamin D with MVI Will restart regular exercise now that she is able

## 2016-07-24 NOTE — Assessment & Plan Note (Signed)
Statin intolerant Work on weight loss Start exercise Lipid, cmp

## 2016-07-24 NOTE — Progress Notes (Signed)
Pre visit review using our clinic review tool, if applicable. No additional management support is needed unless otherwise documented below in the visit note. 

## 2016-07-24 NOTE — Assessment & Plan Note (Signed)
Check a1c, urine micro Sugars well controlled at home Continue metformin

## 2016-07-24 NOTE — Assessment & Plan Note (Signed)
BP elevated today, but has been controlled at home She will start monitoring it regularly - call if elevated No changes in medications today cmp

## 2016-07-24 NOTE — Assessment & Plan Note (Signed)
Follows with oncology No evidence of recurrence

## 2016-07-24 NOTE — Assessment & Plan Note (Signed)
intermittent Denies abdominal cramping, just gets diarrhea Rarely takes imodium

## 2016-07-27 ENCOUNTER — Encounter: Payer: Self-pay | Admitting: Internal Medicine

## 2016-08-14 ENCOUNTER — Encounter: Payer: Self-pay | Admitting: Hematology and Oncology

## 2016-08-14 ENCOUNTER — Ambulatory Visit (HOSPITAL_BASED_OUTPATIENT_CLINIC_OR_DEPARTMENT_OTHER): Payer: Medicare Other | Admitting: Hematology and Oncology

## 2016-08-14 DIAGNOSIS — Z853 Personal history of malignant neoplasm of breast: Secondary | ICD-10-CM | POA: Diagnosis not present

## 2016-08-14 DIAGNOSIS — C50111 Malignant neoplasm of central portion of right female breast: Secondary | ICD-10-CM

## 2016-08-14 DIAGNOSIS — Z170 Estrogen receptor positive status [ER+]: Principal | ICD-10-CM

## 2016-08-14 NOTE — Progress Notes (Signed)
Patient Care Team: Binnie Rail, MD as PCP - General (Internal Medicine)  DIAGNOSIS:  Encounter Diagnosis  Name Primary?  . 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 Migdalia Dk       CHIEF COMPLIANT: Surveillance of breast cancer  INTERVAL HISTORY: ANTONAE ZBIKOWSKI is a 75 year old with above-mentioned history of right-sided breast cancer treated with bilateral mastectomies with reconstruction. She did not tolerate antiestrogen therapy. She is currently on surveillance and is here for annual follow-up. She denies any lumps or nodules in the breast.  REVIEW OF SYSTEMS:   Constitutional: Denies fevers, chills or abnormal weight loss Eyes: Denies blurriness of vision Ears, nose, mouth, throat, and face: Denies mucositis or sore throat Respiratory: Denies cough, dyspnea or wheezes Cardiovascular: Denies palpitation, chest discomfort Gastrointestinal:  Denies nausea, heartburn or change in bowel habits Skin: Denies abnormal skin rashes Lymphatics: Denies new lymphadenopathy or easy bruising Neurological:Denies numbness, tingling or new weaknesses Behavioral/Psych: Mood is stable, no new changes  Extremities: No  lower extremity edema Breast:  denies any pain or lumps or nodules in either reconstructed breasts All other systems were reviewed with the patient and are negative.  I have reviewed the past medical history, past surgical history, social history and family history with the patient and they are unchanged from previous note.  ALLERGIES:  is allergic to sulfur; exemestane; morphine and related; statins; ace inhibitors; and codeine.  MEDICATIONS:  Current Outpatient Prescriptions  Medication Sig Dispense Refill  . cloNIDine (CATAPRES) 0.2 MG tablet Take 1 tablet (0.2 mg total) by mouth 2 (two) times daily. 180 tablet 3  . Cyanocobalamin (VITAMIN B-12) 5000 MCG SUBL Place under the tongue.    Marland Kitchen losartan-hydrochlorothiazide (HYZAAR) 100-12.5 MG tablet Take 1 tablet by mouth daily. 90 tablet 3  . metFORMIN (GLUCOPHAGE) 500 MG tablet TAKE  (1)  TABLET TWICE A DAY WITH MEALS (BREAKFAST AND SUPPER) 180 tablet 3  . Multiple Vitamin (MULTIVITAMIN) capsule Take 1 capsule by mouth daily.      . Omega-3 Fatty Acids (FISH OIL CONCENTRATE) 1000 MG CAPS Take by mouth 2 (two) times daily.     Marland Kitchen oxybutynin (OXYTROL) 3.9 MG/24HR Place 1 patch onto the skin 2 (two) times a week.      . Probiotic Product (ALIGN) 4 MG CAPS Take by mouth as needed.     . ranitidine (ZANTAC) 150 MG tablet Take 150 mg by mouth 2 (two) times daily.     No current facility-administered medications for this visit.     PHYSICAL EXAMINATION: ECOG PERFORMANCE STATUS: 1 - Symptomatic but completely ambulatory  Vitals:   08/14/16 1013  BP: Marland Kitchen)  146/73  Pulse: 84  Resp: 18  Temp: 97.6 F (36.4 C)   Filed Weights   08/14/16 1013  Weight: 172 lb 9.6 oz (78.3 kg)    GENERAL:alert, no distress and comfortable SKIN: skin color, texture, turgor are normal, no rashes or significant lesions EYES: normal, Conjunctiva are pink and non-injected, sclera clear OROPHARYNX:no exudate, no erythema and lips, buccal mucosa, and tongue normal    NECK: supple, thyroid normal size, non-tender, without nodularity LYMPH:  no palpable lymphadenopathy in the cervical, axillary or inguinal LUNGS: clear to auscultation and percussion with normal breathing effort HEART: regular rate & rhythm and no murmurs and no lower extremity edema ABDOMEN:abdomen soft, non-tender and normal bowel sounds MUSCULOSKELETAL:no cyanosis of digits and no clubbing  NEURO: alert & oriented x 3 with fluent speech, no focal motor/sensory deficits EXTREMITIES: No lower extremity edema BREAST: No palpable lumps or nodules in bilateral reconstructed breast or axilla. (exam performed in the presence of a chaperone)  LABORATORY DATA:  I have reviewed the data as listed   Chemistry      Component Value Date/Time   NA 142 07/24/2016 0935   NA 144 01/27/2014 1011   K 3.9 07/24/2016 0935   K 3.6 01/27/2014 1011   CL 103 07/24/2016 0935   CL 106 12/25/2012 0827   CO2 28 07/24/2016 0935   CO2 26 01/27/2014 1011   BUN 19 07/24/2016 0935   BUN 13.1 01/27/2014 1011   CREATININE 1.03 07/24/2016 0935   CREATININE 1.1 01/27/2014 1011      Component Value Date/Time   CALCIUM 10.1 07/24/2016 0935   CALCIUM 10.3 01/27/2014 1011   ALKPHOS 61 07/24/2016 0935   ALKPHOS 63 01/27/2014 1011   AST 36 07/24/2016 0935   AST 31 01/27/2014 1011   ALT 54 (H) 07/24/2016 0935   ALT 45 01/27/2014 1011   BILITOT 1.2 07/24/2016 0935   BILITOT 0.97 01/27/2014 1011       Lab Results  Component Value Date   WBC 6.4 07/24/2016   HGB 14.6 07/24/2016   HCT 42.2 07/24/2016   MCV 85.9 07/24/2016   PLT 247.0 07/24/2016   NEUTROABS 3.7 07/24/2016    ASSESSMENT & PLAN:  Cancer of central portion of right female breast Right-sided breast cancer: Invasive ductal carcinoma grade 2 with high-grade DCIS, 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 Migdalia Dk( left breast showing fibrocystic changes)  took antiestrogen therapy for 15 months discontinued due to intolerance. (Genetic testing for BRCA1 and 2 negative 12/16/2010)  Breast Cancer Surveillance: 1. Breast/ chest exam : Normal 2. Mammogram : No role of mammogram since she had bilateral mastectomies  Return to clinic in 1 year for follow up in survivorship clinic   No orders of the defined types were placed in this encounter.  The patient has a good understanding of the overall plan. she agrees with it. she will call with any problems that may develop before the next visit here.   Rulon Eisenmenger, MD 08/14/16

## 2016-08-14 NOTE — Assessment & Plan Note (Signed)
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 Migdalia Dk( left breast showing fibrocystic changes) took antiestrogen therapy for 15 months discontinued due to intolerance. (Genetic testing for BRCA1 and 2 negative 12/16/2010)  Breast Cancer Surveillance: 1. Breast/ chest exam : Normal 2. Mammogram : No role of mammogram since she had bilateral mastectomies  Return to clinic in 1 year for follow up in survivorship clinic

## 2016-09-18 HISTORY — PX: CATARACT EXTRACTION, BILATERAL: SHX1313

## 2017-04-22 NOTE — Progress Notes (Deleted)
Subjective:    Patient ID: [name], female    DOB: 1941/04/25, 76 y.o.   MRN: [id number]  HPI She is here for an acute visit.  Feeling tired/run down:    Medications and allergies reviewed with patient and updated if appropriate.  Patient Active Problem List   Diagnosis Date Noted  . Fatty liver 08/05/2015  . History of breast cancer 08/04/2015  . S/P bilateral mastectomy 08/04/2015  . Osteoarthritis of left knee 08/04/2015  . Hypercalcemia 04/17/2015  . Dyslipidemia 04/15/2015  . Elevated LFTs 08/10/2014  . Osteopenia 08/31/2013  . Transfusion history 08/18/2013  . IBS (irritable bowel syndrome) 08/18/2013  . Cancer of central portion of right female breast ([place]) 09/04/2010  . History of colonic polyps 06/06/2010  . Essential hypertension 02/16/2009  . Diabetes type 2, controlled ([place]) 08/11/2008  . Esophageal reflux 05/06/2008  . GILBERT'S SYNDROME 02/06/2007    Current Outpatient Prescriptions on File Prior to Visit  Medication Sig Dispense Refill  . cloNIDine (CATAPRES) 0.2 MG tablet Take 1 tablet (0.2 mg total) by mouth 2 (two) times daily. 180 tablet 3  . Cyanocobalamin (VITAMIN B-12) 5000 MCG SUBL Place under the tongue.    Marland Kitchen losartan-hydrochlorothiazide (HYZAAR) 100-12.5 MG tablet Take 1 tablet by mouth daily. 90 tablet 3  . metFORMIN (GLUCOPHAGE) 500 MG tablet TAKE  (1)  TABLET TWICE A DAY WITH MEALS (BREAKFAST AND SUPPER) 180 tablet 3  . Multiple Vitamin (MULTIVITAMIN) capsule Take 1 capsule by mouth daily.      . Omega-3 Fatty Acids (FISH OIL CONCENTRATE) 1000 MG CAPS Take by mouth 2 (two) times daily.     Marland Kitchen oxybutynin (OXYTROL) 3.9 MG/24HR Place 1 patch onto the skin 2 (two) times a week.      . Probiotic Product (ALIGN) 4 MG CAPS Take by mouth as needed.     . ranitidine (ZANTAC) 150 MG tablet Take 150 mg by mouth 2 (two) times daily.     No current facility-administered medications on file prior to visit.     Past Medical History:  Diagnosis  Date  . Breast cancer ([place]) 2012  . Diabetes mellitus   . GERD (gastroesophageal reflux disease)   . Gilbert syndrome   . Hypertension   . IBS (irritable bowel syndrome)   . Plantar fasciitis of right foot    Dr Oneta Rack  . Transfusion history 1976    Past Surgical History:  Procedure Laterality Date  . ABDOMINAL HYSTERECTOMY  1976   with BSO due to infection from [facility] IUD  . APPENDECTOMY  1976   @ [place]     [id number], [phone]  . COLONOSCOPY W/ POLYPECTOMY  2006   negative 2011; Dr Carlean Purl  . MASTECTOMY W/ NODES PARTIAL  2012   double mastectomy with nodes taken out on right side   . PLACEMENT OF BREAST IMPLANTS  04/2011   Dr Migdalia Dk, [facility]  . TONSILLECTOMY AND ADENOIDECTOMY      Social History   Social History  . Marital status: Married    Spouse name: N/A  . Number of children: N/A  . Years of education: N/A   Social History Main Topics  . Smoking status: Former Smoker    Quit date: 09/18/1977  . Smokeless tobacco: Never Used     Comment: smoked 1957-1979 , up to 1 ppd  . Alcohol use No     Comment:  very rarely  . Drug use: No  .  Sexual activity: Not Currently    Birth control/ protection: Surgical   Other Topics Concern  . Not on file   Social History Narrative   No regular exercise - will start    Family History  Problem Relation Age of Onset  . Heart attack Father 62  . Breast cancer Sister 48        bilateral   . Heart failure Sister        PMH intensive chemotherapy  . Hypertension Sister   . Stroke Sister 27  . Breast cancer Maternal Aunt         2 M aunts (40, 73)  . Diabetes Maternal Grandmother     Review of Systems     Objective:  There were no vitals filed for this visit. There were no vitals filed for this visit. There is no height or weight on file to calculate BMI.  Wt Readings from Last 3 Encounters:  08/14/16 172 lb 9.6 oz (78.3 kg)  07/24/16 168 lb (76.2 kg)  08/16/15 174 lb  4.8 oz (79.1 kg)     Physical Exam        Assessment & Plan:   See Problem List for Assessment and Plan of chronic medical problems.

## 2017-04-23 ENCOUNTER — Ambulatory Visit: Payer: Self-pay | Admitting: Internal Medicine

## 2017-04-25 ENCOUNTER — Other Ambulatory Visit (INDEPENDENT_AMBULATORY_CARE_PROVIDER_SITE_OTHER): Payer: Medicare Other

## 2017-04-25 ENCOUNTER — Encounter: Payer: Self-pay | Admitting: Internal Medicine

## 2017-04-25 ENCOUNTER — Ambulatory Visit (INDEPENDENT_AMBULATORY_CARE_PROVIDER_SITE_OTHER): Payer: Medicare Other | Admitting: Internal Medicine

## 2017-04-25 VITALS — BP 138/74 | HR 96 | Temp 98.1°F | Resp 16 | Wt 172.0 lb

## 2017-04-25 DIAGNOSIS — R5383 Other fatigue: Secondary | ICD-10-CM

## 2017-04-25 DIAGNOSIS — E119 Type 2 diabetes mellitus without complications: Secondary | ICD-10-CM | POA: Diagnosis not present

## 2017-04-25 DIAGNOSIS — M2550 Pain in unspecified joint: Secondary | ICD-10-CM | POA: Diagnosis not present

## 2017-04-25 LAB — CBC WITH DIFFERENTIAL/PLATELET
BASOS ABS: 0 10*3/uL (ref 0.0–0.1)
Basophils Relative: 0.8 % (ref 0.0–3.0)
EOS ABS: 0.1 10*3/uL (ref 0.0–0.7)
Eosinophils Relative: 2.4 % (ref 0.0–5.0)
HCT: 41.8 % (ref 36.0–46.0)
Hemoglobin: 14.4 g/dL (ref 12.0–15.0)
LYMPHS ABS: 1.9 10*3/uL (ref 0.7–4.0)
LYMPHS PCT: 32.1 % (ref 12.0–46.0)
MCHC: 34.5 g/dL (ref 30.0–36.0)
MCV: 88.6 fl (ref 78.0–100.0)
Monocytes Absolute: 0.6 10*3/uL (ref 0.1–1.0)
Monocytes Relative: 9.4 % (ref 3.0–12.0)
NEUTROS ABS: 3.3 10*3/uL (ref 1.4–7.7)
NEUTROS PCT: 55.3 % (ref 43.0–77.0)
PLATELETS: 221 10*3/uL (ref 150.0–400.0)
RBC: 4.72 Mil/uL (ref 3.87–5.11)
RDW: 13.4 % (ref 11.5–15.5)
WBC: 6.1 10*3/uL (ref 4.0–10.5)

## 2017-04-25 LAB — COMPREHENSIVE METABOLIC PANEL
ALK PHOS: 61 U/L (ref 39–117)
ALT: 68 U/L — ABNORMAL HIGH (ref 0–35)
AST: 58 U/L — ABNORMAL HIGH (ref 0–37)
Albumin: 4.3 g/dL (ref 3.5–5.2)
BUN: 15 mg/dL (ref 6–23)
CALCIUM: 9.5 mg/dL (ref 8.4–10.5)
CO2: 27 meq/L (ref 19–32)
Chloride: 104 mEq/L (ref 96–112)
Creatinine, Ser: 1.04 mg/dL (ref 0.40–1.20)
GFR: 54.82 mL/min — ABNORMAL LOW (ref >60.00)
GLUCOSE: 169 mg/dL — AB (ref 70–99)
POTASSIUM: 3.7 meq/L (ref 3.5–5.1)
Sodium: 139 mEq/L (ref 135–145)
Total Bilirubin: 1.1 mg/dL (ref 0.2–1.2)
Total Protein: 7.5 g/dL (ref 6.0–8.3)

## 2017-04-25 LAB — SEDIMENTATION RATE: SED RATE: 25 mm/h (ref 0–30)

## 2017-04-25 LAB — HEMOGLOBIN A1C: HEMOGLOBIN A1C: 7.3 % — AB (ref 4.6–6.5)

## 2017-04-25 LAB — C-REACTIVE PROTEIN: CRP: 0.9 mg/dL (ref 0.5–20.0)

## 2017-04-25 LAB — TSH: TSH: 2.93 u[IU]/mL (ref 0.35–4.50)

## 2017-04-25 NOTE — Progress Notes (Signed)
Subjective:    Patient ID: [name], female    DOB: 11/26/1940, 76 y.o.   MRN: [id number]  HPI She is here for an acute visit.    She has no energy.  It started just over a month ago - that is when it became very noticeable, But may have started prior to that.  A couple of months ago she would go to get her paper and she would have difficulty getting back up the driveway.   It is getting harder.  She denies any shortness of breath, chest pain or palpitations.  She was exercising regularly-walking, but has stopped that because of her fatigue. She has chronic arthritis and joint pain, but it is slightly worse. She has noticed some mild lightheadedness. She denies any other major symptoms.  She denies travel in the past year.  She denies tick bites.  She denies changes in medications, supplements.     Her weight, BP and sugars have been stable.    Diabetes: She is taking her medication daily as prescribed. She is compliant with a diabetic diet. She did stop exercising because of her fatigue. Her sugars have been stable and well controlled at home.   Medications and allergies reviewed with patient and updated if appropriate.  Patient Active Problem List   Diagnosis Date Noted  . Fatty liver 08/05/2015  . History of breast cancer 08/04/2015  . S/P bilateral mastectomy 08/04/2015  . Osteoarthritis of left knee 08/04/2015  . Hypercalcemia 04/17/2015  . Dyslipidemia 04/15/2015  . Elevated LFTs 08/10/2014  . Osteopenia 08/31/2013  . Transfusion history 08/18/2013  . IBS (irritable bowel syndrome) 08/18/2013  . Cancer of central portion of right female breast ([place]) 09/04/2010  . History of colonic polyps 06/06/2010  . Essential hypertension 02/16/2009  . Diabetes type 2, controlled ([place]) 08/11/2008  . Esophageal reflux 05/06/2008  . GILBERT'S SYNDROME 02/06/2007    Current Outpatient Prescriptions on File Prior to Visit  Medication Sig Dispense Refill  . cloNIDine  (CATAPRES) 0.2 MG tablet Take 1 tablet (0.2 mg total) by mouth 2 (two) times daily. 180 tablet 3  . Cyanocobalamin (VITAMIN B-12) 5000 MCG SUBL Place under the tongue.    Marland Kitchen losartan-hydrochlorothiazide (HYZAAR) 100-12.5 MG tablet Take 1 tablet by mouth daily. 90 tablet 3  . metFORMIN (GLUCOPHAGE) 500 MG tablet TAKE  (1)  TABLET TWICE A DAY WITH MEALS (BREAKFAST AND SUPPER) 180 tablet 3  . Multiple Vitamin (MULTIVITAMIN) capsule Take 1 capsule by mouth daily.      . Omega-3 Fatty Acids (FISH OIL CONCENTRATE) 1000 MG CAPS Take by mouth 2 (two) times daily.     Marland Kitchen oxybutynin (OXYTROL) 3.9 MG/24HR Place 1 patch onto the skin 2 (two) times a week.      . Probiotic Product (ALIGN) 4 MG CAPS Take by mouth as needed.     . ranitidine (ZANTAC) 150 MG tablet Take 150 mg by mouth 2 (two) times daily.     No current facility-administered medications on file prior to visit.     Past Medical History:  Diagnosis Date  . Breast cancer ([place]) 2012  . Diabetes mellitus   . GERD (gastroesophageal reflux disease)   . Gilbert syndrome   . Hypertension   . IBS (irritable bowel syndrome)   . Plantar fasciitis of right foot    Dr Oneta Rack  . Transfusion history 1976    Past Surgical History:  Procedure Laterality Date  . ABDOMINAL HYSTERECTOMY  1976  with BSO due to infection from [facility] IUD  . APPENDECTOMY  1976   @ [place]     [id number], [phone]  . COLONOSCOPY W/ POLYPECTOMY  2006   negative 2011; Dr Carlean Purl  . MASTECTOMY W/ NODES PARTIAL  2012   double mastectomy with nodes taken out on right side   . PLACEMENT OF BREAST IMPLANTS  04/2011   Dr Migdalia Dk, [facility]  . TONSILLECTOMY AND ADENOIDECTOMY      Social History   Social History  . Marital status: Married    Spouse name: N/A  . Number of children: N/A  . Years of education: N/A   Social History Main Topics  . Smoking status: Former Smoker    Quit date: 09/18/1977  . Smokeless tobacco: Never Used       Comment: smoked 1957-1979 , up to 1 ppd  . Alcohol use No     Comment:  very rarely  . Drug use: No  . Sexual activity: Not Currently    Birth control/ protection: Surgical   Other Topics Concern  . Not on file   Social History Narrative   No regular exercise - will start    Family History  Problem Relation Age of Onset  . Heart attack Father 67  . Breast cancer Sister 83        bilateral   . Heart failure Sister        PMH intensive chemotherapy  . Hypertension Sister   . Stroke Sister 59  . Breast cancer Maternal Aunt         2 M aunts (40, 48)  . Diabetes Maternal Grandmother     Review of Systems  Constitutional: Positive for fatigue. Negative for appetite change, chills, fever and unexpected weight change.  HENT: Positive for rhinorrhea. Negative for congestion and sore throat.   Respiratory: Negative for cough, shortness of breath and wheezing.   Cardiovascular: Negative for chest pain, palpitations and leg swelling.  Gastrointestinal: Negative for abdominal pain, blood in stool (no melena), constipation, diarrhea and nausea.       No gerd  Genitourinary: Positive for frequency. Negative for dysuria and hematuria.       No change in odor or color  Musculoskeletal: Positive for arthralgias (everything hurts more - diffuse which is not new) and myalgias (more than in the past).  Skin: Negative for rash.  Neurological: Positive for light-headedness (sometimes when she first gets up). Negative for weakness, numbness and headaches.  Psychiatric/Behavioral: Positive for dysphoric mood (related to fatigue). Negative for sleep disturbance (sleeps 8 hours).       Objective:   Vitals:   04/25/17 1056  BP: 138/74  Pulse: 96  Resp: 16  Temp: 98.1 F (36.7 C)   Filed Weights   04/25/17 1056  Weight: 172 lb (78 kg)   Body mass index is 28.62 kg/m.  Wt Readings from Last 3 Encounters:  04/25/17 172 lb (78 kg)  08/14/16 172 lb 9.6 oz (78.3 kg)  07/24/16 168  lb (76.2 kg)     Physical Exam  Constitutional: She is oriented to person, place, and time. She appears well-developed and well-nourished. No distress.  HENT:  Head: Normocephalic and atraumatic.  Eyes: Conjunctivae are normal.  Neck: Normal range of motion. Neck supple. No tracheal deviation present. No thyromegaly present.  Cardiovascular: Normal rate, regular rhythm and normal heart sounds.   No murmur heard. Pulmonary/Chest: Effort normal and breath sounds normal. No respiratory  distress. She has no wheezes. She has no rales.  Abdominal: She exhibits no distension. There is no tenderness.  Musculoskeletal: She exhibits no edema.  No obvious joint swelling  Lymphadenopathy:    She has cervical adenopathy.  Neurological: She is alert and oriented to person, place, and time.  Skin: Skin is warm and dry. Rash noted. She is not diaphoretic.  Psychiatric: She has a normal mood and affect. Her behavior is normal. Thought content normal.          Assessment & Plan:   See Problem List for Assessment and Plan of chronic medical problems.

## 2017-04-25 NOTE — Assessment & Plan Note (Signed)
Sugars have been well controlled at home She is due for an A1c so we will check that today

## 2017-04-25 NOTE — Patient Instructions (Addendum)
  Test(s) ordered today. Your results will be released to MyChart (or called to you) after review, usually within 72hours after test completion. If any changes need to be made, you will be notified at that same time.    Medications reviewed and updated.  No changes recommended at this time.     

## 2017-04-25 NOTE — Assessment & Plan Note (Signed)
She has known osteoarthritis in several joints, but recently has had increased joint pain along with significant fatigue Will rule out Lyme disease We will check autoimmune blood work including ANA, rheumatoid factor, ESR CRP and CCP

## 2017-04-25 NOTE — Assessment & Plan Note (Signed)
Started over a month ago and seems to be getting worse. Associated with some increase in her chronic joint pain, but no other symptoms She denies snoring or apnea, but will consider sleep apnea if all of the other tests, negative We will check routine blood work including CBC, CMP, TSH Since her joint pain has worsened Will rule out an autoimmune cause. She does have known osteoarthritis-Chek rheumatoid factor, CCP, ANA, CRP and ESR Will rule out Lyme disease. She denies any tick bites, but does have dog Further evaluation depending on above results

## 2017-04-26 LAB — CYCLIC CITRUL PEPTIDE ANTIBODY, IGG: Cyclic Citrullin Peptide Ab: <16 Units

## 2017-04-26 LAB — RHEUMATOID FACTOR: Rhuematoid fact SerPl-aCnc: <14 IU/mL (ref <14)

## 2017-04-26 LAB — ANA: Anti Nuclear Antibody(ANA): NEGATIVE

## 2017-04-26 LAB — LYME AB/WESTERN BLOT REFLEX

## 2017-04-28 ENCOUNTER — Encounter: Payer: Self-pay | Admitting: Internal Medicine

## 2017-07-10 ENCOUNTER — Ambulatory Visit (INDEPENDENT_AMBULATORY_CARE_PROVIDER_SITE_OTHER): Payer: Medicare Other | Admitting: General Practice

## 2017-07-10 DIAGNOSIS — Z23 Encounter for immunization: Secondary | ICD-10-CM

## 2017-08-08 ENCOUNTER — Encounter: Payer: Self-pay | Admitting: Internal Medicine

## 2017-08-14 ENCOUNTER — Encounter: Payer: Self-pay | Admitting: Internal Medicine

## 2017-08-14 NOTE — Patient Instructions (Addendum)
Test(s) ordered today. Your results will be released to MyChart (or called to you) after review, usually within 72hours after test completion. If any changes need to be made, you will be notified at that same time.  All other Health Maintenance issues reviewed.   All recommended immunizations and age-appropriate screenings are up-to-date or discussed.  No immunizations administered today.   Medications reviewed and updated.  No changes recommended at this time.  Your prescription(s) have been submitted to your pharmacy. Please take as directed and contact our office if you believe you are having problem(s) with the medication(s).  Please followup in 6 months   Health Maintenance, Female Adopting a healthy lifestyle and getting preventive care can go a long way to promote health and wellness. Talk with your health care provider about what schedule of regular examinations is right for you. This is a good chance for you to check in with your provider about disease prevention and staying healthy. In between checkups, there are plenty of things you can do on your own. Experts have done a lot of research about which lifestyle changes and preventive measures are most likely to keep you healthy. Ask your health care provider for more information. Weight and diet Eat a healthy diet  Be sure to include plenty of vegetables, fruits, low-fat dairy products, and lean protein.  Do not eat a lot of foods high in solid fats, added sugars, or salt.  Get regular exercise. This is one of the most important things you can do for your health. ? Most adults should exercise for at least 150 minutes each week. The exercise should increase your heart rate and make you sweat (moderate-intensity exercise). ? Most adults should also do strengthening exercises at least twice a week. This is in addition to the moderate-intensity exercise.  Maintain a healthy weight  Body mass index (BMI) is a measurement that can  be used to identify possible weight problems. It estimates body fat based on height and weight. Your health care provider can help determine your BMI and help you achieve or maintain a healthy weight.  For females 20 years of age and older: ? A BMI below 18.5 is considered underweight. ? A BMI of 18.5 to 24.9 is normal. ? A BMI of 25 to 29.9 is considered overweight. ? A BMI of 30 and above is considered obese.  Watch levels of cholesterol and blood lipids  You should start having your blood tested for lipids and cholesterol at 75 years of age, then have this test every 5 years.  You may need to have your cholesterol levels checked more often if: ? Your lipid or cholesterol levels are high. ? You are older than 75 years of age. ? You are at high risk for heart disease.  Cancer screening Lung Cancer  Lung cancer screening is recommended for adults 55-80 years old who are at high risk for lung cancer because of a history of smoking.  A yearly low-dose CT scan of the lungs is recommended for people who: ? Currently smoke. ? Have quit within the past 15 years. ? Have at least a 30-pack-year history of smoking. A pack year is smoking an average of one pack of cigarettes a day for 1 year.  Yearly screening should continue until it has been 15 years since you quit.  Yearly screening should stop if you develop a health problem that would prevent you from having lung cancer treatment.  Breast Cancer  Practice breast self-awareness. This   means understanding how your breasts normally appear and feel.  It also means doing regular breast self-exams. Let your health care provider know about any changes, no matter how small.  If you are in your 20s or 30s, you should have a clinical breast exam (CBE) by a health care provider every 1-3 years as part of a regular health exam.  If you are 40 or older, have a CBE every year. Also consider having a breast X-ray (mammogram) every year.  If you  have a family history of breast cancer, talk to your health care provider about genetic screening.  If you are at high risk for breast cancer, talk to your health care provider about having an MRI and a mammogram every year.  Breast cancer gene (BRCA) assessment is recommended for women who have family members with BRCA-related cancers. BRCA-related cancers include: ? Breast. ? Ovarian. ? Tubal. ? Peritoneal cancers.  Results of the assessment will determine the need for genetic counseling and BRCA1 and BRCA2 testing.  Cervical Cancer Your health care provider may recommend that you be screened regularly for cancer of the pelvic organs (ovaries, uterus, and vagina). This screening involves a pelvic examination, including checking for microscopic changes to the surface of your cervix (Pap test). You may be encouraged to have this screening done every 3 years, beginning at age 21.  For women ages 30-65, health care providers may recommend pelvic exams and Pap testing every 3 years, or they may recommend the Pap and pelvic exam, combined with testing for human papilloma virus (HPV), every 5 years. Some types of HPV increase your risk of cervical cancer. Testing for HPV may also be done on women of any age with unclear Pap test results.  Other health care providers may not recommend any screening for nonpregnant women who are considered low risk for pelvic cancer and who do not have symptoms. Ask your health care provider if a screening pelvic exam is right for you.  If you have had past treatment for cervical cancer or a condition that could lead to cancer, you need Pap tests and screening for cancer for at least 20 years after your treatment. If Pap tests have been discontinued, your risk factors (such as having a new sexual partner) need to be reassessed to determine if screening should resume. Some women have medical problems that increase the chance of getting cervical cancer. In these cases,  your health care provider may recommend more frequent screening and Pap tests.  Colorectal Cancer  This type of cancer can be detected and often prevented.  Routine colorectal cancer screening usually begins at 75 years of age and continues through 75 years of age.  Your health care provider may recommend screening at an earlier age if you have risk factors for colon cancer.  Your health care provider may also recommend using home test kits to check for hidden blood in the stool.  A small camera at the end of a tube can be used to examine your colon directly (sigmoidoscopy or colonoscopy). This is done to check for the earliest forms of colorectal cancer.  Routine screening usually begins at age 50.  Direct examination of the colon should be repeated every 5-10 years through 75 years of age. However, you may need to be screened more often if early forms of precancerous polyps or small growths are found.  Skin Cancer  Check your skin from head to toe regularly.  Tell your health care provider about any new   moles or changes in moles, especially if there is a change in a mole's shape or color.  Also tell your health care provider if you have a mole that is larger than the size of a pencil eraser.  Always use sunscreen. Apply sunscreen liberally and repeatedly throughout the day.  Protect yourself by wearing long sleeves, pants, a wide-brimmed hat, and sunglasses whenever you are outside.  Heart disease, diabetes, and high blood pressure  High blood pressure causes heart disease and increases the risk of stroke. High blood pressure is more likely to develop in: ? People who have blood pressure in the high end of the normal range (130-139/85-89 mm Hg). ? People who are overweight or obese. ? People who are African American.  If you are 18-39 years of age, have your blood pressure checked every 3-5 years. If you are 40 years of age or older, have your blood pressure checked every year.  You should have your blood pressure measured twice-once when you are at a hospital or clinic, and once when you are not at a hospital or clinic. Record the average of the two measurements. To check your blood pressure when you are not at a hospital or clinic, you can use: ? An automated blood pressure machine at a pharmacy. ? A home blood pressure monitor.  If you are between 55 years and 79 years old, ask your health care provider if you should take aspirin to prevent strokes.  Have regular diabetes screenings. This involves taking a blood sample to check your fasting blood sugar level. ? If you are at a normal weight and have a low risk for diabetes, have this test once every three years after 75 years of age. ? If you are overweight and have a high risk for diabetes, consider being tested at a younger age or more often. Preventing infection Hepatitis B  If you have a higher risk for hepatitis B, you should be screened for this virus. You are considered at high risk for hepatitis B if: ? You were born in a country where hepatitis B is common. Ask your health care provider which countries are considered high risk. ? Your parents were born in a high-risk country, and you have not been immunized against hepatitis B (hepatitis B vaccine). ? You have HIV or AIDS. ? You use needles to inject street drugs. ? You live with someone who has hepatitis B. ? You have had sex with someone who has hepatitis B. ? You get hemodialysis treatment. ? You take certain medicines for conditions, including cancer, organ transplantation, and autoimmune conditions.  Hepatitis C  Blood testing is recommended for: ? Everyone born from 1945 through 1965. ? Anyone with known risk factors for hepatitis C.  Sexually transmitted infections (STIs)  You should be screened for sexually transmitted infections (STIs) including gonorrhea and chlamydia if: ? You are sexually active and are younger than 75 years of  age. ? You are older than 75 years of age and your health care provider tells you that you are at risk for this type of infection. ? Your sexual activity has changed since you were last screened and you are at an increased risk for chlamydia or gonorrhea. Ask your health care provider if you are at risk.  If you do not have HIV, but are at risk, it may be recommended that you take a prescription medicine daily to prevent HIV infection. This is called pre-exposure prophylaxis (PrEP). You are considered at risk   if: ? You are sexually active and do not regularly use condoms or know the HIV status of your partner(s). ? You take drugs by injection. ? You are sexually active with a partner who has HIV.  Talk with your health care provider about whether you are at high risk of being infected with HIV. If you choose to begin PrEP, you should first be tested for HIV. You should then be tested every 3 months for as long as you are taking PrEP. Pregnancy  If you are premenopausal and you may become pregnant, ask your health care provider about preconception counseling.  If you may become pregnant, take 400 to 800 micrograms (mcg) of folic acid every day.  If you want to prevent pregnancy, talk to your health care provider about birth control (contraception). Osteoporosis and menopause  Osteoporosis is a disease in which the bones lose minerals and strength with aging. This can result in serious bone fractures. Your risk for osteoporosis can be identified using a bone density scan.  If you are 75 years of age or older, or if you are at risk for osteoporosis and fractures, ask your health care provider if you should be screened.  Ask your health care provider whether you should take a calcium or vitamin D supplement to lower your risk for osteoporosis.  Menopause may have certain physical symptoms and risks.  Hormone replacement therapy may reduce some of these symptoms and risks. Talk to your health  care provider about whether hormone replacement therapy is right for you. Follow these instructions at home:  Schedule regular health, dental, and eye exams.  Stay current with your immunizations.  Do not use any tobacco products including cigarettes, chewing tobacco, or electronic cigarettes.  If you are pregnant, do not drink alcohol.  If you are breastfeeding, limit how much and how often you drink alcohol.  Limit alcohol intake to no more than 1 drink per day for nonpregnant women. One drink equals 12 ounces of beer, 5 ounces of wine, or 1 ounces of hard liquor.  Do not use street drugs.  Do not share needles.  Ask your health care provider for help if you need support or information about quitting drugs.  Tell your health care provider if you often feel depressed.  Tell your health care provider if you have ever been abused or do not feel safe at home. This information is not intended to replace advice given to you by your health care provider. Make sure you discuss any questions you have with your health care provider. Document Released: 03/20/2011 Document Revised: 02/10/2016 Document Reviewed: 06/08/2015 Elsevier Interactive Patient Education  [employer].

## 2017-08-14 NOTE — Progress Notes (Signed)
Subjective:    Patient ID: [name], female    DOB: [birth year], 76 y.o.   MRN: [id number]  HPI She is here for a physical exam.   She is walking for exercise and has been walking more.  She has no concerns.   She feels she is eating well - she is compliant with a low sugar diet.   Medications and allergies reviewed with patient and updated if appropriate.  Patient Active Problem List   Diagnosis Date Noted  . Fatty liver 08/05/2015  . History of breast cancer 08/04/2015  . S/P bilateral mastectomy 08/04/2015  . Osteoarthritis of left knee 08/04/2015  . Dyslipidemia 04/15/2015  . Elevated LFTs 08/10/2014  . Osteopenia 08/31/2013  . Transfusion history 08/18/2013  . IBS (irritable bowel syndrome) 08/18/2013  . 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 Joint pain 09/02/2010  . History of colonic polyps 06/06/2010  . Essential hypertension 02/16/2009  . Diabetes type 2, controlled ([place]) 08/11/2008  . Esophageal reflux 05/06/2008  . GILBERT'S SYNDROME 02/06/2007    Current Outpatient Medications on File Prior to Visit  Medication Sig Dispense Refill  . Cyanocobalamin (VITAMIN B-12) 5000 MCG SUBL Place under the tongue.    . Multiple Vitamin (MULTIVITAMIN) capsule Take 1 capsule by mouth daily.      . Omega-3 Fatty Acids (FISH OIL CONCENTRATE) 1000 MG CAPS Take by mouth 2 (two) times daily.     Marland Kitchen oxybutynin (OXYTROL) 3.9 MG/24HR Place 1 patch onto the skin 2 (two) times a week.      . Probiotic Product (ALIGN) 4 MG CAPS Take by mouth as needed.     . ranitidine (ZANTAC) 150 MG tablet Take 150 mg by mouth 2 (two) times daily.     No current facility-administered medications on file prior to visit.     Past Medical History:  Diagnosis Date  . Breast cancer ([place]) 2012  . Diabetes mellitus   . GERD (gastroesophageal reflux disease)   . Gilbert syndrome   . Hypertension   . IBS (irritable bowel syndrome)   . Plantar fasciitis of right  foot    Dr Oneta Rack  . Transfusion history 1976    Past Surgical History:  Procedure Laterality Date  . ABDOMINAL HYSTERECTOMY  1976   with BSO due to infection from [facility] IUD  . APPENDECTOMY  1976   @ [place]     [id number], [phone]  . CATARACT EXTRACTION, BILATERAL Bilateral 2018  . COLONOSCOPY W/ POLYPECTOMY  2006   negative 2011; Dr Carlean Purl  . MASTECTOMY W/ NODES PARTIAL  2012   double mastectomy with nodes taken out on right side   . PLACEMENT OF BREAST IMPLANTS  04/2011   Dr Migdalia Dk, [facility]  . TONSILLECTOMY AND ADENOIDECTOMY      Social History   Socioeconomic History  . Marital status: Married    Spouse name: None  . Number of children: None  . Years of education: None  . Highest education level: None  Social Needs  . Financial resource strain: None  . Food insecurity - worry: None  . Food insecurity - inability: None  . Transportation needs - medical: None  . Transportation needs - non-medical: None  Occupational History  . None  Tobacco Use  . Smoking status: Former Smoker    Last attempt to quit: 09/18/1977    Years since quitting: 39.9  . Smokeless tobacco: Never Used  .  Tobacco comment: smoked 1957-1979 , up to 1 ppd  Substance and Sexual Activity  . Alcohol use: No    Comment:  very rarely  . Drug use: No  . Sexual activity: Not Currently    Birth control/protection: Surgical  Other Topics Concern  . None  Social History Narrative   Walking for exercise    Family History  Problem Relation Age of Onset  . Heart attack Father 61  . Breast cancer Sister 33        bilateral   . Heart failure Sister        PMH intensive chemotherapy  . Hypertension Sister   . Stroke Sister 32  . Breast cancer Maternal Aunt         2 M aunts (40, 9)  . Diabetes Maternal Grandmother     Review of Systems  Constitutional: Negative for chills and fever.  Eyes: Negative for visual disturbance.  Respiratory: Positive for  cough. Negative for shortness of breath and wheezing.   Cardiovascular: Negative for chest pain, palpitations and leg swelling.  Gastrointestinal: Positive for diarrhea. Negative for abdominal pain, blood in stool, constipation and nausea.       No gerd  Genitourinary: Negative for dysuria and hematuria.  Musculoskeletal: Positive for arthralgias (knees, thumbs, foot).  Skin: Negative for color change and rash.  Neurological: Negative for light-headedness and headaches.  Psychiatric/Behavioral: Negative for dysphoric mood. The patient is not nervous/anxious.        Objective:   Vitals:   08/15/17 0813  BP: 122/72  Pulse: 95  Resp: 16  Temp: (!) 97.5 F (36.4 C)  SpO2: 97%   Filed Weights   08/15/17 0813  Weight: 170 lb (77.1 kg)   Body mass index is 28.29 kg/m.  Wt Readings from Last 3 Encounters:  08/15/17 170 lb (77.1 kg)  04/25/17 172 lb (78 kg)  08/14/16 172 lb 9.6 oz (78.3 kg)     Physical Exam Constitutional: She appears well-developed and well-nourished. No distress.  HENT:  Head: Normocephalic and atraumatic.  Right Ear: External ear normal. Normal ear canal and TM Left Ear: External ear normal.  Normal ear canal and TM Mouth/Throat: Oropharynx is clear and moist.  Eyes: Conjunctivae and EOM are normal.  Neck: Neck supple. No tracheal deviation present. No thyromegaly present.  No carotid bruit  Cardiovascular: Normal rate, regular rhythm and normal heart sounds.   No murmur heard.  No edema. Pulmonary/Chest: Effort normal and breath sounds normal. No respiratory distress. She has no wheezes. She has no rales.  Breast: deferred to oncology Abdominal: Soft. She exhibits no distension. There is no tenderness.  Lymphadenopathy: She has no cervical adenopathy.  Skin: Skin is warm and dry. She is not diaphoretic.  Psychiatric: She has a normal mood and affect. Her behavior is normal.   Diabetic Foot Exam - Simple   Simple Foot Form Diabetic Foot exam was  performed with the following findings:  Yes 08/15/2017  8:34 AM  Visual Inspection No deformities, no ulcerations, no other skin breakdown bilaterally:  Yes Sensation Testing Intact to touch and monofilament testing bilaterally:  Yes Pulse Check Posterior Tibialis and Dorsalis pulse intact bilaterally:  Yes Comments Mild dryness         Assessment & Plan:   Physical exam: Screening blood work   ordered Immunizations  shingrix discussed, others up to date Colonoscopy   Up to date  Mammogram - s/p b/l mastectomy Gyn - does not seen gyn -- sees  oncology Dexa  Up to date  Eye exams   Up to date  EKG  Last done 2013 Exercise  She is walking - 25-30 min 2-3 times a week Weight   Advised weight loss Skin   No concerns  - sees derm annually  Substance abuse  none  See Problem List for Assessment and Plan of chronic medical problems.

## 2017-08-15 ENCOUNTER — Encounter: Payer: Self-pay | Admitting: Internal Medicine

## 2017-08-15 ENCOUNTER — Ambulatory Visit (INDEPENDENT_AMBULATORY_CARE_PROVIDER_SITE_OTHER): Payer: Medicare Other | Admitting: Internal Medicine

## 2017-08-15 ENCOUNTER — Other Ambulatory Visit (INDEPENDENT_AMBULATORY_CARE_PROVIDER_SITE_OTHER): Payer: Medicare Other

## 2017-08-15 VITALS — BP 122/72 | HR 95 | Temp 97.5°F | Resp 16 | Ht 65.0 in | Wt 170.0 lb

## 2017-08-15 DIAGNOSIS — I10 Essential (primary) hypertension: Secondary | ICD-10-CM

## 2017-08-15 DIAGNOSIS — E119 Type 2 diabetes mellitus without complications: Secondary | ICD-10-CM

## 2017-08-15 DIAGNOSIS — K219 Gastro-esophageal reflux disease without esophagitis: Secondary | ICD-10-CM

## 2017-08-15 DIAGNOSIS — Z853 Personal history of malignant neoplasm of breast: Secondary | ICD-10-CM | POA: Diagnosis not present

## 2017-08-15 DIAGNOSIS — M8580 Other specified disorders of bone density and structure, unspecified site: Secondary | ICD-10-CM | POA: Diagnosis not present

## 2017-08-15 DIAGNOSIS — Z0000 Encounter for general adult medical examination without abnormal findings: Secondary | ICD-10-CM

## 2017-08-15 LAB — TSH: TSH: 3.74 u[IU]/mL (ref 0.35–4.50)

## 2017-08-15 LAB — COMPREHENSIVE METABOLIC PANEL
ALBUMIN: 4.1 g/dL (ref 3.5–5.2)
ALT: 74 U/L — ABNORMAL HIGH (ref 0–35)
AST: 81 U/L — ABNORMAL HIGH (ref 0–37)
Alkaline Phosphatase: 63 U/L (ref 39–117)
BUN: 16 mg/dL (ref 6–23)
CHLORIDE: 102 meq/L (ref 96–112)
CO2: 27 mEq/L (ref 19–32)
Calcium: 9.9 mg/dL (ref 8.4–10.5)
Creatinine, Ser: 1.09 mg/dL (ref 0.40–1.20)
GFR: 51.89 mL/min — ABNORMAL LOW (ref >60.00)
Glucose, Bld: 211 mg/dL — ABNORMAL HIGH (ref 70–99)
POTASSIUM: 3.9 meq/L (ref 3.5–5.1)
SODIUM: 139 meq/L (ref 135–145)
Total Bilirubin: 1.3 mg/dL — ABNORMAL HIGH (ref 0.2–1.2)
Total Protein: 7.3 g/dL (ref 6.0–8.3)

## 2017-08-15 LAB — CBC WITH DIFFERENTIAL/PLATELET
BASOS PCT: 0.4 % (ref 0.0–3.0)
Basophils Absolute: 0 10*3/uL (ref 0.0–0.1)
EOS PCT: 2.5 % (ref 0.0–5.0)
Eosinophils Absolute: 0.2 10*3/uL (ref 0.0–0.7)
HCT: 40.1 % (ref 36.0–46.0)
HEMOGLOBIN: 13.8 g/dL (ref 12.0–15.0)
LYMPHS ABS: 1.8 10*3/uL (ref 0.7–4.0)
Lymphocytes Relative: 28.7 % (ref 12.0–46.0)
MCHC: 34.4 g/dL (ref 30.0–36.0)
MCV: 89.1 fl (ref 78.0–100.0)
MONO ABS: 0.7 10*3/uL (ref 0.1–1.0)
Monocytes Relative: 11.5 % (ref 3.0–12.0)
NEUTROS ABS: 3.5 10*3/uL (ref 1.4–7.7)
Neutrophils Relative %: 56.9 % (ref 43.0–77.0)
PLATELETS: 220 10*3/uL (ref 150.0–400.0)
RBC: 4.5 Mil/uL (ref 3.87–5.11)
RDW: 13.1 % (ref 11.5–15.5)
WBC: 6.1 10*3/uL (ref 4.0–10.5)

## 2017-08-15 LAB — LIPID PANEL
CHOLESTEROL: 167 mg/dL (ref 0–200)
HDL: 31.6 mg/dL — AB (ref >39.00)
NonHDL: 135.03
TRIGLYCERIDES: 206 mg/dL — AB (ref 0.0–149.0)
Total CHOL/HDL Ratio: 5
VLDL: 41.2 mg/dL — ABNORMAL HIGH (ref 0.0–40.0)

## 2017-08-15 LAB — LDL CHOLESTEROL, DIRECT: LDL DIRECT: 108 mg/dL

## 2017-08-15 LAB — HEMOGLOBIN A1C: HEMOGLOBIN A1C: 7.6 % — AB (ref 4.6–6.5)

## 2017-08-15 MED ORDER — LOSARTAN POTASSIUM-HCTZ 100-12.5 MG PO TABS: 1.0000 | ORAL_TABLET | Freq: Every day | ORAL | 3 refills | Status: DC

## 2017-08-15 MED ORDER — CLONIDINE HCL 0.2 MG PO TABS: 0.2000 mg | ORAL_TABLET | Freq: Two times a day (BID) | ORAL | 3 refills | Status: DC

## 2017-08-15 MED ORDER — METFORMIN HCL 500 MG PO TABS: ORAL_TABLET | ORAL | 3 refills | Status: DC

## 2017-08-15 NOTE — Assessment & Plan Note (Signed)
dexa up to date Walking Taking calcium and vitamind d in MVI

## 2017-08-15 NOTE — Assessment & Plan Note (Signed)
Check a1c Low sugar / carb diet Stressed regular exercise, weight loss  

## 2017-08-15 NOTE — Assessment & Plan Note (Signed)
BP well controlled Current regimen effective and well tolerated Continue current medications at current doses cmp  

## 2017-08-15 NOTE — Assessment & Plan Note (Signed)
Taking zantac at night GERD controlled Continue daily medication

## 2017-08-15 NOTE — Assessment & Plan Note (Signed)
Following with oncology No evidence of recurrence

## 2017-08-15 NOTE — Progress Notes (Signed)
CLINIC:  Survivorship   REASON FOR VISIT:  Routine follow-up for history of breast cancer.   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 Migdalia Dk        INTERVAL HISTORY:  [name] presents to the [facility] today for routine follow-up for her history of breast cancer.  Overall, she reports feeling quite well. She is doing well today.  She has no issues today.  She sees her PCP regularly and is up to date on her cancer screenings.  She has undergone genetic testing in 2012 and has not been retested since.      REVIEW OF SYSTEMS:  Review of Systems  Constitutional: Negative for appetite change, chills, fatigue, fever and unexpected weight change.  HENT:   Negative for hearing loss, lump/mass, sore throat and trouble swallowing.   Eyes: Negative for eye problems and icterus.  Respiratory: Negative for chest tightness, cough and shortness of breath.   Cardiovascular: Negative for chest pain, leg swelling and palpitations.  Gastrointestinal: Negative for abdominal distention, abdominal pain, constipation, diarrhea, nausea and vomiting.  Endocrine: Negative for hot flashes.  Musculoskeletal: Negative for arthralgias.  Skin: Negative for itching and rash.  Neurological: Negative for dizziness, extremity  weakness, headaches and numbness.  Hematological: Negative for adenopathy. Does not bruise/bleed easily.  Psychiatric/Behavioral: Negative for depression. The patient is not nervous/anxious.   Breast: Denies any new nodularity, masses, tenderness, nipple changes, or nipple discharge.       PAST MEDICAL/SURGICAL HISTORY:  Past Medical History:  Diagnosis Date  . Breast cancer ([place]) 2012  . Diabetes mellitus   . GERD (gastroesophageal reflux disease)   . Gilbert syndrome   . Hypertension   . IBS (irritable bowel syndrome)   . Plantar fasciitis of right foot    Dr Oneta Rack  . Transfusion history 1976   Past Surgical History:  Procedure Laterality Date  . ABDOMINAL HYSTERECTOMY  1976   with BSO due to infection from [facility] IUD  . APPENDECTOMY  1976   @ [place]     [id number], [phone]  . CATARACT EXTRACTION, BILATERAL Bilateral 2018  . COLONOSCOPY W/ POLYPECTOMY  2006   negative 2011; Dr Carlean Purl  . MASTECTOMY W/ NODES PARTIAL  2012   double mastectomy with nodes taken out on right side   . PLACEMENT OF BREAST IMPLANTS  04/2011   Dr Migdalia Dk, [facility]  . TONSILLECTOMY AND ADENOIDECTOMY       ALLERGIES:  Allergies  Allergen Reactions  . Sulfur     Flushed, funny feeling in throat   . Exemestane Nausea Only    Other reaction(s): Dizziness (intolerance)  . Morphine And Related   . Statins Other (See Comments)  Elevated LFTs  . Ace Inhibitors     REACTION: COUGH  . Codeine     REACTION: VOMITTING     CURRENT MEDICATIONS:  Outpatient Encounter Medications as of 08/16/2017  Medication Sig Note  . cloNIDine (CATAPRES) 0.2 MG tablet Take 1 tablet (0.2 mg total) by mouth 2 (two) times daily.   . Cyanocobalamin (VITAMIN B-12) 5000 MCG SUBL Place under the tongue.   Marland Kitchen losartan-hydrochlorothiazide (HYZAAR) 100-12.5 MG tablet Take 1 tablet by mouth daily.   . metFORMIN (GLUCOPHAGE) 500 MG tablet TAKE  (1)  TABLET TWICE A DAY WITH MEALS  (BREAKFAST AND SUPPER)   . Multiple Vitamin (MULTIVITAMIN) capsule Take 1 capsule by mouth daily.     . Omega-3 Fatty Acids (FISH OIL CONCENTRATE) 1000 MG CAPS Take by mouth 2 (two) times daily.    Marland Kitchen oxybutynin (OXYTROL) 3.9 MG/24HR Place 1 patch onto the skin 2 (two) times a week.   08/18/2013: OTC  Previously Rxed Dr McDiarmid  . Probiotic Product (ALIGN) 4 MG CAPS Take by mouth as needed.    . ranitidine (ZANTAC) 150 MG tablet Take 150 mg by mouth 2 (two) times daily.    No facility-administered encounter medications on file as of 08/16/2017.      ONCOLOGIC FAMILY HISTORY:  Family History  Problem Relation Age of Onset  . Heart attack Father 57  . Breast cancer Sister 54        bilateral   . Heart failure Sister        PMH intensive chemotherapy  . Hypertension Sister   . Stroke Sister 75  . Breast cancer Maternal Aunt         2 M aunts (40, 81)  . Diabetes Maternal Grandmother     GENETIC COUNSELING/TESTING: Negative BRCA testing in 2012  SOCIAL HISTORY:  [name] is married and lives with her husband in [place], [place].  She has 2 children and they live in [place] and [place].  Her step children live in [place].  Ms. Roundtree is currently retired.  She denies any current or history of tobacco, alcohol, or illicit drug use.     PHYSICAL EXAMINATION:  Vital Signs: Vitals:   08/16/17 1059  BP: (!) 165/76  Pulse: (!) 109  Resp: 20  Temp: 97.9 F (36.6 C)  SpO2: 98%   Filed Weights   08/16/17 1059  Weight: 172 lb 3.2 oz (78.1 kg)   General: Well-nourished, well-appearing female in no acute distress.  Unaccompanied today.   HEENT: Head is normocephalic.  Pupils equal and reactive to light. Conjunctivae clear without exudate.  Sclerae anicteric. Oral mucosa is pink, moist.  Oropharynx is pink without lesions or erythema.  Lymph: No cervical, supraclavicular, or infraclavicular lymphadenopathy noted on palpation.  Cardiovascular:  Regular rate and rhythm.Marland Kitchen Respiratory: Clear to auscultation bilaterally. Chest expansion symmetric; breathing non-labored.  Breast Exam:  Breasts are surgically absent with reconstruction present, no nodules, masses, swelling or skin changes.   -Axilla: No axillary adenopathy bilaterally.  GI: Abdomen soft and round; non-tender, non-distended. Bowel sounds normoactive. No hepatosplenomegaly.   GU: Deferred.  Neuro: No focal deficits. Steady gait.  Psych: Mood and affect normal and appropriate for situation.  MSK: No focal spinal tenderness to palpation, full range of motion in bilateral upper extremities Extremities: No edema. Skin: Warm and dry.  LABORATORY DATA:  None for this visit   DIAGNOSTIC IMAGING:  Most recent mammogram: n/a s/p bilateral mastectomies    ASSESSMENT AND PLAN:  Ms.. Romm is a pleasant 76 y.o. female with history of Stage IA right breast invasive ductal carcinoma, ER+/PR-/HER2-, diagnosed in 06/2010, treated with bilateral mastectomies, couldn't tolerate anti estrogen therapies.  She presents to the [facility] for surveillance and routine follow-up.   1. History of breast cancer:  Ms. Ramey is currently clinically and radiographically without evidence of disease or recurrence of breast cancer.   She is eligible for re referral to genetics as she had limited testing in 2012.  I placed this referral today.  I encouraged her to call me with any questions or concerns before her next visit at the cancer center, and I would be happy to see her sooner, if needed.    2. Bone health:  Given Ms. Bordeau's age, history of breast cancer, she is at risk for bone demineralization. Her last DEXA scan was was in 2017 and was consistent with osteopenia with a T score of -1.6.  I will defer to her PCP regarding future bone density testing and management.  In the meantime, she was encouraged to increase her consumption of foods rich in calcium, as well as increase her  weight-bearing activities.  She was given education on specific food and activities to promote bone health.  3. Cancer screening:  Due to Ms. Rudnick's history and her age, she should receive screening for skin cancers, colon cancer. She was encouraged to follow-up with her PCP for appropriate cancer screenings.   4. Health maintenance and wellness promotion: Ms. Gladu was encouraged to consume 5-7 servings of fruits and vegetables per day. She was also encouraged to engage in moderate to vigorous exercise for 30 minutes per day most days of the week. She was instructed to limit her alcohol consumption and continue to abstain from tobacco use.    Dispo:  -Return to cancer center for LTS follow up in one year   A total of (30) minutes of face-to-face time was spent with this patient with greater than 50% of that time in counseling and care-coordination.   Gardenia Phlegm, NP Survivorship Program [facility] [phone]   Note: PRIMARY CARE PROVIDER Binnie Rail, [place] [phone]

## 2017-08-16 ENCOUNTER — Encounter: Payer: Self-pay | Admitting: Internal Medicine

## 2017-08-16 ENCOUNTER — Ambulatory Visit (HOSPITAL_BASED_OUTPATIENT_CLINIC_OR_DEPARTMENT_OTHER): Payer: Medicare Other | Admitting: Adult Health

## 2017-08-16 ENCOUNTER — Encounter: Payer: Self-pay | Admitting: Adult Health

## 2017-08-16 ENCOUNTER — Telehealth: Payer: Self-pay | Admitting: Adult Health

## 2017-08-16 VITALS — BP 165/76 | HR 109 | Temp 97.9°F | Resp 20 | Ht 65.0 in | Wt 172.2 lb

## 2017-08-16 DIAGNOSIS — Z853 Personal history of malignant neoplasm of breast: Secondary | ICD-10-CM

## 2017-08-16 DIAGNOSIS — H04123 Dry eye syndrome of bilateral lacrimal glands: Secondary | ICD-10-CM | POA: Diagnosis not present

## 2017-08-16 DIAGNOSIS — Z170 Estrogen receptor positive status [ER+]: Principal | ICD-10-CM

## 2017-08-16 DIAGNOSIS — C50111 Malignant neoplasm of central portion of right female breast: Secondary | ICD-10-CM

## 2017-08-16 DIAGNOSIS — E119 Type 2 diabetes mellitus without complications: Secondary | ICD-10-CM | POA: Diagnosis not present

## 2017-08-16 DIAGNOSIS — H43813 Vitreous degeneration, bilateral: Secondary | ICD-10-CM | POA: Diagnosis not present

## 2017-08-16 DIAGNOSIS — H26493 Other secondary cataract, bilateral: Secondary | ICD-10-CM | POA: Diagnosis not present

## 2017-08-16 LAB — HM DIABETES EYE EXAM

## 2017-08-16 NOTE — Telephone Encounter (Signed)
Gave patient AVs and calendar of upcoming December & November 2019 appointments.

## 2017-08-20 ENCOUNTER — Ambulatory Visit (HOSPITAL_BASED_OUTPATIENT_CLINIC_OR_DEPARTMENT_OTHER): Payer: Medicare Other | Admitting: Genetic Counselor

## 2017-08-20 ENCOUNTER — Other Ambulatory Visit: Payer: Medicare Other

## 2017-08-20 ENCOUNTER — Encounter: Payer: Self-pay | Admitting: Genetic Counselor

## 2017-08-20 DIAGNOSIS — Z170 Estrogen receptor positive status [ER+]: Secondary | ICD-10-CM | POA: Diagnosis not present

## 2017-08-20 DIAGNOSIS — C50111 Malignant neoplasm of central portion of right female breast: Secondary | ICD-10-CM | POA: Diagnosis not present

## 2017-08-20 DIAGNOSIS — Z803 Family history of malignant neoplasm of breast: Secondary | ICD-10-CM | POA: Diagnosis not present

## 2017-08-20 DIAGNOSIS — Z8041 Family history of malignant neoplasm of ovary: Secondary | ICD-10-CM | POA: Diagnosis not present

## 2017-08-20 NOTE — Progress Notes (Signed)
REFERRING PROVIDER: Nicholas Lose, MD [place], [place] [id number]  PRIMARY PROVIDER:  Binnie Rail, MD  PRIMARY REASON FOR VISIT:  1. Malignant neoplasm of central portion of right breast in female, estrogen receptor positive ([name])   2. Family history of breast cancer   3. Family history of ovarian cancer      HISTORY OF PRESENT ILLNESS:   [name], a 76 y.o. female, was seen for a [facility] cancer genetics consultation at the request of Dr. Lindi Adie due to a personal and family history of cancer.  [name] presents to clinic today to discuss the possibility of a hereditary predisposition to cancer, genetic testing, and to further clarify her future cancer risks, as well as potential cancer risks for family members.   In 2012, at the age of 60, [name] was diagnosed with invasive ductal carcinoma of the right breast. The tumor was ER+/PR-/Her2-. This was treated with bilateral mastectomy.  She was on anti-estrogen for about 13 months. BRCA1/2 testing was performed at that time and is negative.     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 Migdalia Dk        HORMONAL RISK FACTORS:  Menarche was at age 52.  First live birth at age 64.  OCP use for approximately 0 years.  Ovaries intact: no.   Hysterectomy: yes.  Menopausal status: postmenopausal.  HRT use: 40 years. Colonoscopy: yes; 1 polyp. Mammogram within the last year: No, but is scheduled for breast MRI. Number of breast biopsies: 0. Up to date with pelvic exams:  n/a. Any excessive radiation exposure in the past:  no  Past Medical History:  Diagnosis Date  . Breast cancer ([place]) 2012  . Diabetes mellitus   . Family history of breast cancer   . Family history of ovarian cancer   . GERD (gastroesophageal reflux disease)   . Gilbert syndrome   . Hypertension   . IBS (irritable bowel syndrome)   . Plantar fasciitis of right foot    Dr Oneta Rack  . Transfusion history 1976    Past Surgical History:  Procedure Laterality Date  . ABDOMINAL HYSTERECTOMY  1976   with BSO due to infection from [facility] IUD  . APPENDECTOMY  1976   @ [place]     [id number], [phone]  . CATARACT EXTRACTION, BILATERAL Bilateral 2018  . COLONOSCOPY W/ POLYPECTOMY  2006   negative 2011; Dr Carlean Purl  . MASTECTOMY W/ NODES PARTIAL  2012   double mastectomy with nodes taken out on right side   . PLACEMENT OF BREAST IMPLANTS  04/2011   Dr Migdalia Dk, [facility]  . TONSILLECTOMY AND ADENOIDECTOMY      Social History  Socioeconomic History  . Marital status: Married    Spouse name: Not on file  . Number of children: Not on file  . Years of education: Not on file  . Highest education level: Not on file  Social Needs  . Financial resource strain: Not on file  . Food insecurity - worry: Not on file  . Food insecurity - inability: Not on file  . Transportation needs - medical: Not on file  . Transportation needs - non-medical: Not on file  Occupational History  . Not on file  Tobacco Use  . Smoking status: Former Smoker    Last attempt to quit: 09/18/1977    Years since quitting: 39.9  . Smokeless tobacco: Never Used  . Tobacco comment: smoked 1957-1979 , up to 1 ppd  Substance and Sexual Activity  .  Alcohol use: No    Comment:  very rarely  . Drug use: No  . Sexual activity: Not Currently    Birth control/protection: Surgical  Other Topics Concern  . Not on file  Social History Narrative   Walking for exercise     FAMILY HISTORY:  We obtained a detailed, 4-generation family history.  Significant diagnoses are listed below: Family History  Problem Relation Age of Onset  . Heart attack Father 56  . Breast cancer Sister 54        bilateral   . Heart failure Sister        PMH intensive chemotherapy  . Hypertension Sister   . Stroke Sister 35  . Stroke Mother   . Breast cancer Maternal Aunt 26  . Diabetes Maternal Grandmother   . Ovarian cancer Paternal Grandmother   . Breast cancer Maternal Aunt 77    The patient ha two sons who are cancer free.  She had two brothers and a sister.  One brother died at 32 from measles and the other brother died at 48 from a car accident.  Her sister is living but has had strokes.  She had breast cancer at 29.  Both parents are deceased.  The mother died at 49 from complications of a stroke.  She had four sisters and four brothers.  One sister had breast cancer at 84 and another sister had breast cancer at 2.  No other cancer was reported on this side of the family.  The patient's father had several siblings, but the patient lost track of this side of the family.  The grandmother reportedly had ovarian cancer.  [name] is unaware of previous family history of genetic testing for hereditary cancer risks. Patient's maternal ancestors are of [place] descent, and paternal ancestors are of [place] descent. There is no reported Ashkenazi Jewish ancestry. There is no known consanguinity.  GENETIC COUNSELING ASSESSMENT: [name] is a 76 y.o. female with a personal and family history of breast cancer which is somewhat suggestive of a hereditary cancer syndrome and predisposition to cancer. We, therefore, discussed and recommended  the following at today's visit.   DISCUSSION: We discussed that about 5-10% of breast cancer is hereditary with most cases due to BRCA mutations.  The patient' had previous BRCA testing in 2012 that was negative.  Therefore the most likely result for BRCA testing will be negative, although some families were missed. Other genes that are associated with hereditary breast cancer syndromes include ATM, CHEK2 and PALB2.  We reviewed the characteristics, features and inheritance patterns of hereditary cancer syndromes. We also discussed genetic testing, including the appropriate  family members to test, the process of testing, insurance coverage and turn-around-time for results. We discussed the implications of a negative, positive and/or variant of uncertain significant result. We recommended [name] pursue genetic testing for the common hereditary cancer gene panel. The Hereditary Gene Panel offered by [facility] includes sequencing and/or deletion duplication testing of the following 47 genes: APC, ATM, AXIN2, BARD1, BMPR1A, BRCA1, BRCA2, BRIP1, CDH1, CDK4, CDKN2A (p14ARF), CDKN2A (p16INK4a), CHEK2, CTNNA1, DICER1, EPCAM (Deletion/duplication testing only), GREM1 (promoter region deletion/duplication testing only), KIT, MEN1, MLH1, MSH2, MSH3, MSH6, MUTYH, NBN, NF1, NHTL1, PALB2, PDGFRA, PMS2, POLD1, POLE, PTEN, RAD50, RAD51C, RAD51D, SDHB, SDHC, SDHD, SMAD4, SMARCA4. STK11, TP53, TSC1, TSC2, and VHL.  The following genes were evaluated for sequence changes only: SDHA and HOXB13 c.251G>A variant only.   Based on [name]'s personal and family history of cancer, she meets medical criteria for genetic testing. Despite that she meets criteria, she may still have an out of pocket cost. We discussed that if her out of pocket cost for testing is over $100, the laboratory will call and confirm whether she wants to proceed with testing.  If the out of pocket cost of testing is less than $100 she will be billed by the  genetic testing laboratory.   PLAN: After considering the risks, benefits, and limitations, [name]  provided informed consent to pursue genetic testing and the blood sample was sent to [facility] for analysis of the common hereditary cancer syndrome. Results should be available within approximately 2-3 weeks' time, at which point they will be disclosed by telephone to [name], as will any additional recommendations warranted by these results. [name] will receive a summary of her genetic counseling visit and a copy of her results once available. This information will also be available in Epic. We encouraged [name] to remain in contact with cancer genetics annually so that we can continuously update the family history and inform her of any changes in cancer genetics and testing that may be of benefit for her family. [name]'s questions were answered to her satisfaction today. Our contact information was provided should additional questions or concerns arise.  Lastly, we encouraged [name] to remain in contact with cancer genetics annually so that we can continuously update the family history and inform her of any changes in cancer genetics and testing that may be of benefit for this family.   [name]'s questions were answered to her satisfaction today. Our contact information was provided should additional questions or concerns arise. Thank you for the referral and allowing [place] to share in the care of your patient.   Octavian Godek P. Florene Glen, [place], [facility] Certified Genetic Counselor [email or web address] phone: [phone]  The patient was seen for a total of 60 minutes in face-to-face genetic counseling.  This patient was discussed with Drs. Magrinat, Lindi Adie and/or Burr Medico who agrees with the above.    _______________________________________________________________________ For Office Staff:  Number of people involved in session: 1 Was an Intern/ student  involved with case: no

## 2017-08-23 ENCOUNTER — Encounter: Payer: Self-pay | Admitting: Internal Medicine

## 2017-08-28 ENCOUNTER — Telehealth: Payer: Self-pay | Admitting: Internal Medicine

## 2017-08-28 NOTE — Telephone Encounter (Signed)
Copied from [place] [id number]. Topic: Quick Communication - See Telephone Encounter >> Aug 28, 2017  4:14 PM Boyd Kerbs wrote: CRM for notification. See Telephone encounter for:  [place] [phone]  Metformin question on how many should patient be taking everyday. Prescription says 1 tablet 2 times a day but patient thinks it should be 2 tablets twice a day   Needs clarifing  08/28/17.

## 2017-08-29 MED ORDER — METFORMIN HCL 500 MG PO TABS: ORAL_TABLET | ORAL | 1 refills | Status: DC

## 2017-08-29 NOTE — Telephone Encounter (Signed)
Spoke with pharmacy to advise RX has changed, New RX sent to [facility]

## 2017-08-29 NOTE — Telephone Encounter (Signed)
See CRM [id number] Medication clarification; the original prescription reads    "MetFORMIN HCl 500 MG TAKE (1) TABLET TWICE A DAY WITH MEALS (BREAKFAST AND SUPPER)"; please verify orders and contact Nira Conn at [facility]

## 2017-09-04 ENCOUNTER — Telehealth: Payer: Self-pay | Admitting: Genetic Counselor

## 2017-09-04 ENCOUNTER — Encounter: Payer: Self-pay | Admitting: Genetic Counselor

## 2017-09-04 ENCOUNTER — Ambulatory Visit: Payer: Self-pay | Admitting: Genetic Counselor

## 2017-09-04 DIAGNOSIS — C50111 Malignant neoplasm of central portion of right female breast: Secondary | ICD-10-CM

## 2017-09-04 DIAGNOSIS — Z1379 Encounter for other screening for genetic and chromosomal anomalies: Secondary | ICD-10-CM

## 2017-09-04 DIAGNOSIS — Z170 Estrogen receptor positive status [ER+]: Secondary | ICD-10-CM

## 2017-09-04 DIAGNOSIS — Z8041 Family history of malignant neoplasm of ovary: Secondary | ICD-10-CM

## 2017-09-04 DIAGNOSIS — Z853 Personal history of malignant neoplasm of breast: Secondary | ICD-10-CM

## 2017-09-04 DIAGNOSIS — Z86010 Personal history of colonic polyps: Secondary | ICD-10-CM

## 2017-09-04 DIAGNOSIS — Z803 Family history of malignant neoplasm of breast: Secondary | ICD-10-CM

## 2017-09-04 HISTORY — DX: Encounter for other screening for genetic and chromosomal anomalies: Z13.79

## 2017-09-04 NOTE — Telephone Encounter (Signed)
Revealed negative genetic testing.  Discussed that we do not know why she has breast cancer or why there is cancer in the family. It could be due to a different gene that we are not testing, or maybe our current technology may not be able to pick something up.  It will be important for her to keep in contact with genetics to keep up with whether additional testing may be needed. 

## 2017-09-04 NOTE — Progress Notes (Signed)
HPI: [name]. [name] was previously seen in the [place] clinic due to a personal and family history of cancer and concerns regarding a hereditary predisposition to cancer. Please refer to our prior cancer genetics clinic note for more information regarding [name]. [name]'s medical, social and family histories, and our assessment and recommendations, at the time. [name]. [name]'s recent genetic test results were disclosed to her, as were recommendations warranted by these results. These results and recommendations are discussed in more detail below.  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 Migdalia Dk      09/02/2017 Genetic Testing    Negative genetic testing on the common hereditary cancer panel.  The Hereditary Gene Panel offered by [facility] includes sequencing and/or deletion duplication testing of the following 47 genes: APC, ATM, AXIN2, BARD1, BMPR1A, BRCA1, BRCA2, BRIP1, CDH1, CDK4, CDKN2A (p14ARF), CDKN2A (p16INK4a), CHEK2, CTNNA1, DICER1, EPCAM (Deletion/duplication testing only), GREM1 (promoter region deletion/duplication testing only), KIT, MEN1, MLH1, MSH2, MSH3, MSH6, MUTYH, NBN, NF1, NHTL1, PALB2, PDGFRA, PMS2, POLD1, POLE, PTEN, RAD50, RAD51C, RAD51D, SDHB, SDHC, SDHD, SMAD4, SMARCA4. STK11, TP53, TSC1, TSC2, and VHL.  The  following genes were evaluated for sequence changes only: SDHA and HOXB13 c.251G>A variant only. The report date is September 02, 2017.       FAMILY HISTORY:  We obtained a detailed, 4-generation family history.  Significant diagnoses are listed below: Family History  Problem Relation Age of Onset  . Heart attack Father 51  . Breast cancer Sister 22        bilateral   . Heart failure Sister        PMH intensive chemotherapy  . Hypertension Sister   . Stroke Sister 40  . Stroke Mother   . Breast cancer Maternal Aunt 25  . Diabetes Maternal Grandmother   . Ovarian cancer Paternal Grandmother   . Breast cancer Maternal Aunt 77    The patient ha two sons who are cancer free.  She had two brothers and a sister.  One brother died at 61 from measles and the other brother died at 54 from a car accident.  Her sister is living but has had strokes.  She had breast cancer at 28.  Both parents are deceased.  The mother died at 67 from complications of a stroke.  She had four sisters and four brothers.  One sister had breast cancer at 56 and another sister had breast cancer at 18.  No other cancer was reported on this side of the family.  The patient's father had several siblings, but the patient lost track of this side of the family.  The grandmother reportedly had ovarian cancer.  [name]. [name] is unaware of previous family history of genetic testing for hereditary cancer risks.  Patient's maternal ancestors are of [place] descent, and paternal ancestors are of [place] descent. There is no reported Ashkenazi Jewish ancestry. There is no known consanguinity.  GENETIC TEST RESULTS: Genetic testing reported out on September 04, 2017 through the Common Hereditary cancer panel found no deleterious mutations.  The Hereditary Gene Panel offered by [facility] includes sequencing and/or deletion duplication testing of the following 47 genes: APC, ATM, AXIN2, BARD1, BMPR1A, BRCA1, BRCA2, BRIP1, CDH1,  CDK4, CDKN2A (p14ARF), CDKN2A (p16INK4a), CHEK2, CTNNA1, DICER1, EPCAM (Deletion/duplication testing only), GREM1 (promoter region deletion/duplication testing only), KIT, MEN1, MLH1, MSH2, MSH3, MSH6, MUTYH, NBN, NF1, NHTL1, PALB2, PDGFRA, PMS2, POLD1, POLE, PTEN, RAD50, RAD51C, RAD51D, SDHB, SDHC, SDHD, SMAD4, SMARCA4. STK11, TP53, TSC1, TSC2, and VHL.  The following genes were evaluated for sequence changes only: SDHA and HOXB13 c.251G>A variant only.   The test report has been scanned into EPIC and is located under the Molecular Pathology section of the Results Review tab.   We discussed with [name]. [name] that since the current genetic testing is not perfect, it is possible there may be a gene mutation in one of these genes that current testing cannot detect, but that chance is small. We also discussed, that it is possible that another gene that has not yet been discovered, or that we have not yet tested, is responsible for the cancer diagnoses in the family, and it is, therefore, important to remain in touch with cancer genetics in the future so that we can continue to offer [name]. [name] the most up to date genetic testing.      CANCER SCREENING RECOMMENDATIONS:  This result is reassuring and indicates that [name]. [name] likely does not have an increased risk for a future cancer due to a mutation in one of these genes. This normal test also suggests that [name]. [name]'s cancer was most likely not due to an inherited predisposition associated with one of these genes.  Most cancers happen by chance and this negative test suggests that her cancer falls into this category.  We, therefore, recommended she continue to follow the cancer management and screening guidelines provided by her oncology and primary healthcare provider.   RECOMMENDATIONS FOR FAMILY MEMBERS: Women in this family might be at some increased risk of developing cancer, over the general population risk, simply due to the family history of  cancer. We recommended women in this family have a yearly mammogram beginning at age 71, or 39 years younger than the earliest onset of cancer, an annual clinical breast exam, and perform monthly breast self-exams. Women in this family should also have a gynecological exam as recommended by their primary provider. All family members should have a colonoscopy by age 60.  FOLLOW-UP: Lastly, we discussed with [name]. Killian that cancer genetics is a rapidly advancing field and it is possible that new genetic tests will be appropriate for her and/or her family members in the future. We encouraged her to remain in contact with cancer genetics on an annual basis so we can update her personal and family histories and let her know of advances in cancer genetics that may benefit this family.   Our contact number was provided. [name]. [place]'s questions were answered to her satisfaction, and she knows she is welcome to call us at anytime with additional questions or concerns.   Carol Kayser, [name], [facility] Certified Genetic Counselor [email or web address]

## 2017-12-17 ENCOUNTER — Telehealth: Payer: Self-pay | Admitting: Emergency Medicine

## 2017-12-17 DIAGNOSIS — R6889 Other general symptoms and signs: Secondary | ICD-10-CM

## 2017-12-17 DIAGNOSIS — R269 Unspecified abnormalities of gait and mobility: Secondary | ICD-10-CM

## 2017-12-17 NOTE — Telephone Encounter (Signed)
Copied from [place] [id number]. Topic: Referral - Request >> Dec 17, 2017  2:01 PM Hewitt Shorts wrote: Reason for CRM: pt is requesting a neurology   referral because her gait is off and her hand witting has changed   Best number [phone]   Pt is requesting Dr. Tomi Likens [phone]  >> Dec 17, 2017  3:07 PM Para Skeans A wrote: LOV was 08/2017. Would you like for them to have an appointment?

## 2017-12-17 NOTE — Telephone Encounter (Signed)
Referral ordered

## 2017-12-18 NOTE — Telephone Encounter (Signed)
Spoke with pt to inform. Pt has appt scheduled with Neurology.

## 2017-12-19 ENCOUNTER — Encounter: Payer: Self-pay | Admitting: Neurology

## 2017-12-19 ENCOUNTER — Ambulatory Visit: Payer: Medicare Other | Admitting: Neurology

## 2017-12-19 VITALS — BP 108/60 | HR 96 | Resp 16 | Ht 64.5 in | Wt 169.2 lb

## 2017-12-19 DIAGNOSIS — R2681 Unsteadiness on feet: Secondary | ICD-10-CM | POA: Diagnosis not present

## 2017-12-19 NOTE — Patient Instructions (Addendum)
While small handwriting is commonly seen in Parkinson's disease, you do not meet the criteria for Parkinson's disease.  The balance issues may be due to the arthritis in the knees or neuropathy in the feet due to diabetes.  Often time will tell if symptoms progress.  Therefore, I want to reevaluate you in 6 months to see if you present with other symptoms that may meet the criteria for diagnosis.  In the meantime, I will refer you to physical therapy to help with your balance.  If symptoms get worse and you feel the need to see me sooner, please contact us.

## 2017-12-19 NOTE — Progress Notes (Signed)
NEUROLOGY CONSULTATION NOTE  MAGIE CIAMPA MRN: [id number] DOB: [birth year]  Referring provider: Dr. [name] Primary care provider: Dr. [name]  Reason for consult:  Abnormal gait, worsening handwriting  HISTORY OF PRESENT ILLNESS: [name] is a 77 year old female with diabetes, HTN, Gilbert Syndrome, and IBS who presents for abnormal gait and handwriting..    Since last summer, she has had trouble with her penmanship.  She states it is more difficult to write and that her handwriting is smaller.  She denies cramping.  About 2 months ago, she started having increased problems with balance.  She needs to push off in order to stand.  She feels weak in the legs.  She denies back pain or radicular pain and numbness in the feet.  She denies neck pain.  She denies freezing when initiating taking a step.  She denies dizziness or lightheadedness.  She looked up her symptoms on line and is concerned about Parkinson's disease.  She denies tremor, difficulty eating (using utensils, swallowing), drooling.  Sense of smell is intact.  She does not experience symptoms consistent with REM sleep behavior disorder.  She reports no cognitive difficulty.  There is no family history of PD.  08/15/17 LABS:  CBC with WBC 6.1, HGB 13.8, HCT 40.1, PLT 220; TSH 3.74; CMP with Na 139, K 3.9, Cl 102, CO2 27, glucose 211, BUN 16, Cr 1.09, t bili 1.3, ALP 63, AST 81 and ALT 74; Hgb A1c 7.6  PAST MEDICAL HISTORY: Past Medical History:  Diagnosis Date  . Breast cancer ([place]) 2012  . Diabetes mellitus   . Family history of breast cancer   . Family history of ovarian cancer   . GERD (gastroesophageal reflux disease)   . Gilbert syndrome   . Hypertension   . IBS (irritable bowel syndrome)   . Plantar fasciitis of right foot    Dr Oneta Rack  . Transfusion history 1976    PAST SURGICAL HISTORY: Past Surgical History:  Procedure Laterality Date  . ABDOMINAL HYSTERECTOMY  1976   with BSO due to  infection from [facility] IUD  . APPENDECTOMY  1976   @ [place]     [id number], [phone]  . CATARACT EXTRACTION, BILATERAL Bilateral 2018  . COLONOSCOPY W/ POLYPECTOMY  2006   negative 2011; Dr Carlean Purl  . MASTECTOMY W/ NODES PARTIAL  2012   double mastectomy with nodes taken out on right side   . PLACEMENT OF BREAST IMPLANTS  04/2011   Dr Migdalia Dk, [facility]  . TONSILLECTOMY AND ADENOIDECTOMY      MEDICATIONS: Current Outpatient Medications on File Prior to Visit  Medication Sig Dispense Refill  . cloNIDine (CATAPRES) 0.2 MG tablet Take 1 tablet (0.2 mg total) by mouth 2 (two) times daily. 180 tablet 3  . Cyanocobalamin (VITAMIN B-12) 5000 MCG SUBL Place under the tongue.    Marland Kitchen losartan-hydrochlorothiazide (HYZAAR) 100-12.5 MG tablet Take 1 tablet by mouth daily. 90 tablet 3  . metFORMIN (GLUCOPHAGE) 500 MG tablet TAKE  2  TABLETS TWICE A DAY WITH MEALS (BREAKFAST AND SUPPER) 360 tablet 1  . Multiple Vitamin (MULTIVITAMIN) capsule Take 1 capsule by mouth daily.      . Omega-3 Fatty Acids (FISH OIL CONCENTRATE) 1000 MG CAPS Take by mouth 2 (two) times daily.     . ranitidine (ZANTAC) 150 MG tablet Take 150 mg by mouth 2 (two) times daily.    Marland Kitchen oxybutynin (OXYTROL) 3.9 MG/24HR Place 1 patch onto  the skin 2 (two) times a week.      . Probiotic Product (ALIGN) 4 MG CAPS Take by mouth as needed.      No current facility-administered medications on file prior to visit.     ALLERGIES: Allergies  Allergen Reactions  . Sulfur     Flushed, funny feeling in throat   . Exemestane Nausea Only    Other reaction(s): Dizziness (intolerance)  . Morphine And Related   . Statins Other (See Comments)    Elevated LFTs  . Ace Inhibitors     REACTION: COUGH  . Codeine     REACTION: VOMITTING    FAMILY HISTORY: Family History  Problem Relation Age of Onset  . Heart attack Father 45  . Breast cancer Sister 57        bilateral   . Heart failure Sister         PMH intensive chemotherapy  . Hypertension Sister   . Stroke Sister 54  . Stroke Mother        TMI  . Breast cancer Maternal Aunt 42  . Diabetes Maternal Grandmother   . Ovarian cancer Paternal Grandmother   . Breast cancer Maternal Aunt 77    SOCIAL HISTORY: Social History   Socioeconomic History  . Marital status: Married    Spouse name: Not on file  . Number of children: Not on file  . Years of education: Not on file  . Highest education level: Not on file  Occupational History  . Not on file  Social Needs  . Financial resource strain: Not on file  . Food insecurity:    Worry: Not on file    Inability: Not on file  . Transportation needs:    Medical: Not on file    Non-medical: Not on file  Tobacco Use  . Smoking status: Former Smoker    Last attempt to quit: 09/18/1977    Years since quitting: 40.2  . Smokeless tobacco: Never Used  . Tobacco comment: smoked 1957-1979 , up to 1 ppd  Substance and Sexual Activity  . Alcohol use: No    Comment:  very rarely  . Drug use: No  . Sexual activity: Not Currently    Birth control/protection: Surgical  Lifestyle  . Physical activity:    Days per week: Not on file    Minutes per session: Not on file  . Stress: Not on file  Relationships  . Social connections:    Talks on phone: Not on file    Gets together: Not on file    Attends religious service: Not on file    Active member of club or organization: Not on file    Attends meetings of clubs or organizations: Not on file    Relationship status: Not on file  . Intimate partner violence:    Fear of current or ex partner: Not on file    Emotionally abused: Not on file    Physically abused: Not on file    Forced sexual activity: Not on file  Other Topics Concern  . Not on file  Social History Narrative   Walking for exercise    REVIEW OF SYSTEMS: Constitutional: No fevers, chills, or sweats, no generalized fatigue, change in appetite Eyes: No visual changes,  double vision, eye pain Ear, nose and throat: No hearing loss, ear pain, nasal congestion, sore throat Cardiovascular: No chest pain, palpitations Respiratory:  No shortness of breath at rest or with exertion, wheezes GastrointestinaI: IBS Genitourinary:  No dysuria, urinary retention or frequency Musculoskeletal:  No neck pain, back pain Integumentary: No rash, pruritus, skin lesions Neurological: as above Psychiatric: No depression, insomnia, anxiety Endocrine: No palpitations, fatigue, diaphoresis, mood swings, change in appetite, change in weight, increased thirst Hematologic/Lymphatic:  No purpura, petechiae. Allergic/Immunologic: no itchy/runny eyes, nasal congestion, recent allergic reactions, rashes  PHYSICAL EXAM: Vitals:   12/19/17 0951  BP: 108/60  Pulse: 96  Resp: 16  SpO2: 96%   General: No acute distress.  Patient appears well-groomed.  Head:  Normocephalic/atraumatic Eyes:  fundi examined but not visualized Neck: supple, no paraspinal tenderness, full range of motion Back: No paraspinal tenderness Heart: regular rate and rhythm Lungs: Clear to auscultation bilaterally. Vascular: No carotid bruits. Neurological Exam: Mental status: alert and oriented to person, place, and time, recent and remote memory intact, fund of knowledge intact, attention and concentration intact, speech fluent and not dysarthric, language intact. Cranial nerves: CN I: not tested CN II: pupils equal, round and reactive to light, visual fields intact CN III, IV, VI:  full range of motion, no nystagmus, no ptosis CN V: facial sensation intact CN VII: upper and lower face symmetric CN VIII: hearing intact CN IX, X: gag intact, uvula midline CN XI: sternocleidomastoid and trapezius muscles intact CN XII: tongue midline Bulk & Tone: normal, no rigidity, no fasciculations. Motor:  5/5 throughout.  At first she demonstrated reduced finger-thumb tapping speed and amplitude which diminished with  encouragement.  No resting, postural, kinetic or intention tremor.  Handwriting does appear slow Sensation:  Pinprick sensation intact; vibration sensation slightly reduced in feet. Deep Tendon Reflexes:  2+ throughout, toes downgoing.  Finger to nose testing:  Without dysmetria.  Gait:  Wide-based, slightly waddling gait.  Upright posture with mildly reduced arm swing bilaterally.  4 steps to turn.  Romberg with sway.  IMPRESSION: While she does exhibit micrographia, she does not meet the criteria for Parkinson's disease.  She may have very mild questionable bradykinesia but nothing significant.  She does not exhibit rigidity or tremor.  Her gait abnormality is not typical for Parkinson's disease and may be attributed to arthritis in her knees or possibly underlying diabetic polyneuropathy.  She does not exhibit UMN symptoms to suggest myelopathy due to cervical spinal stenosis or lateralizing symptoms to suggest an intracranial abnormality.  She does not exhibit dizziness to suggest vestibulopathy.    PLAN: Under these circumstances, I recommend monitoring and returning in 6 months for reevaluation to see if she has any progressed or new symptoms to alter possible diagnosis.  If she has any significant worsening symptoms that warrants attention, she should contact us for sooner follow-up.  In the meantime, I will refer her to physical therapy to evaluate and treat her unsteady gait.  Thank you for allowing me to take part in the care of this patient.  Metta Clines, DO  CC: Billey Gosling, MD

## 2017-12-25 DIAGNOSIS — C50919 Malignant neoplasm of unspecified site of unspecified female breast: Secondary | ICD-10-CM | POA: Diagnosis not present

## 2017-12-25 DIAGNOSIS — Z9013 Acquired absence of bilateral breasts and nipples: Secondary | ICD-10-CM | POA: Diagnosis not present

## 2017-12-25 DIAGNOSIS — N651 Disproportion of reconstructed breast: Secondary | ICD-10-CM | POA: Diagnosis not present

## 2018-01-07 ENCOUNTER — Encounter: Payer: Self-pay | Admitting: Physical Therapy

## 2018-01-07 ENCOUNTER — Ambulatory Visit: Payer: Medicare Other | Attending: Neurology | Admitting: Physical Therapy

## 2018-01-07 DIAGNOSIS — R2681 Unsteadiness on feet: Secondary | ICD-10-CM | POA: Insufficient documentation

## 2018-01-07 DIAGNOSIS — R2689 Other abnormalities of gait and mobility: Secondary | ICD-10-CM | POA: Insufficient documentation

## 2018-01-07 NOTE — Therapy (Signed)
[facility] [address] [place], [place], [address] Phone: [phone]   Fax:  [phone]  Physical Therapy Evaluation  Patient Details  Name: [name] MRN: [id number] Date of Birth: Feb 06, 1941 Referring Provider: Metta Clines, DO   Encounter Date: 01/07/2018  PT End of Session - 01/07/18 1234    Visit Number  1    Number of Visits  10    Date for PT Re-Evaluation  03/08/18    Authorization Type  UHC Medicare    PT Start Time  0934    PT Stop Time  1016    PT Time Calculation (min)  42 min    Activity Tolerance  Patient tolerated treatment well    Behavior During Therapy  [facility] for tasks assessed/performed       Past Medical History:  Diagnosis Date  . Breast cancer ([place]) 2012  . Diabetes mellitus   . Family history of breast cancer   . Family history of ovarian cancer   . GERD (gastroesophageal reflux disease)   . Gilbert syndrome   . Hypertension   . IBS (irritable bowel syndrome)   . Plantar fasciitis of right foot    Dr Oneta Rack  . Transfusion history 1976    Past Surgical History:  Procedure Laterality Date  . ABDOMINAL HYSTERECTOMY  1976   with BSO due to infection from [facility] IUD  . APPENDECTOMY  1976   @ [place]     [id number], [phone]  . CATARACT EXTRACTION, BILATERAL Bilateral 2018  . COLONOSCOPY W/ POLYPECTOMY  2006   negative 2011; Dr Carlean Purl  . MASTECTOMY W/ NODES PARTIAL  2012   double mastectomy with nodes taken out on right side   . PLACEMENT OF BREAST IMPLANTS  04/2011   Dr Migdalia Dk, [facility]  . TONSILLECTOMY AND ADENOIDECTOMY      There were no vitals filed for this visit.   Subjective Assessment - 01/07/18 0939    Subjective  Pt feels her balance is getting somewhat better.  I feel when I stand I'm unbalanced.  My handwriting has gotten smaller.  At times, I have trouble sleeping due to knee pain, foot pain.  Have a tendency to  "bounce" off the walls, due to unsteadiness with gait.  No reported falls, except for snow in December due to depth of snow.     Pertinent History  breast cancer, DM, HTN, Gilbert Syndrome, IBS, breast cancer    Patient Stated Goals  Pt's goal for PT is to improve steadiness of walking.    Currently in Pain?  No/denies         [facility] PT Assessment - 01/07/18 0944      Assessment   Medical Diagnosis  unsteady gait    Referring Provider  Metta Clines, DO    Onset Date/Surgical Date  12/19/17 MD visit; has been going on 6-8 weeks      Precautions   Precautions  Fall      Balance Screen   Has the patient fallen in the past 6 months  Yes    How many times?  1    Has the patient had a decrease in activity level because of a fear of falling?   No    Is the patient reluctant to leave their home because of a fear of falling?   No      Home Film/video editor residence  Living Arrangements  Spouse/significant other    Available Help at Discharge  Family    Type of [place] to enter    Entrance Stairs-Number of Steps  2 [place]  One level    [place]  -- trekking poles-typically uses one trekking pole      Prior Function   Level of [place]  Enjoys walking, walks the dog; not walking as much due to unsure of gait.      Observation/Other Assessments   Focus on Therapeutic Outcomes (FOTO)   NA      Posture/Postural Control   Posture/Postural Control  Postural limitations    Postural Limitations  Rounded Shoulders      ROM / Strength   AROM / PROM / Strength  Strength      Strength   Overall Strength  Deficits    Overall Strength Comments  grossly tested 4/5 bilateral lower extremities      Transfers   Transfers  Sit to Stand;Stand to Sit    Sit to Stand  6: Modified independent (Device/Increase time);Without upper extremity assist;From  chair/3-in-1    Five time sit to stand comments   unable without UE support x 5    Stand to Sit  6: Modified independent (Device/Increase time);Without upper extremity assist;To chair/3-in-1;Uncontrolled descent      Ambulation/Gait   Ambulation/Gait  Yes    Ambulation/Gait Assistance  6: Modified independent (Device/Increase time)    Ambulation Distance (Feet)  200 Feet    Assistive device  None    Gait Pattern  Step-through pattern;Decreased arm swing - right;Decreased step length - right;Decreased trunk rotation;Poor foot clearance - right    Ambulation Surface  Level;Indoor    Gait velocity  12.9 sec = 2.54 ft/sec      Standardized Balance Assessment   Standardized Balance Assessment  Timed Up and Go Test;Dynamic Gait Index      Dynamic Gait Index   Level Surface  Mild Impairment    Change in Gait Speed  Normal    Gait with Horizontal Head Turns  Mild Impairment    Gait with Vertical Head Turns  Mild Impairment    Gait and Pivot Turn  Normal    Step Over Obstacle  Mild Impairment    Step Around Obstacles  Normal    Steps  Moderate Impairment    Total Score  18    DGI comment:  Scores <19/24 indicate increased fall risk      Timed Up and Go Test   Normal TUG (seconds)  17.47    Manual TUG (seconds)  15.53    Cognitive TUG (seconds)  17.03    TUG Comments  Scores >13.5-15 seconds indicate increased fall risk.      High Level Balance   High Level Balance Comments  Posterior push and release :  3 steps to regain balance .  Pt able to stand EO and EC on solid surface and foam surface 30 seconds with increased sway (EC)                Objective measurements completed on examination: See above findings.                   PT Long Term Goals - 01/07/18 1624      PT LONG TERM GOAL #1  Title  Pt will be independent with HEP to address balance, gait, functional mobility.  TARGET 02/08/18    Time  5    Period  Weeks    Status  New    Target Date   02/08/18      PT LONG TERM GOAL #2   Title  Pt will improve DGI to at least 20/24 for decreased fall risk.    Time  5    Period  Weeks    Status  New    Target Date  02/08/18      PT LONG TERM GOAL #3   Title  Pt will improve TUG score to less than or equal to 13.5 seconds for decreased fall risk.    Time  5    Period  Weeks    Target Date  02/08/18      PT LONG TERM GOAL #4   Title  Pt will improve gait velocity to at least 2.62 ft/sec for improved gait efficiency and safety in community.    Time  5    Period  Weeks    Status  New    Target Date  02/08/18      PT LONG TERM GOAL #5   Title  Pt will verbalize understanding of fall prevention in home environment.    Time  5    Period  Weeks    Status  New    Target Date  02/08/18             Plan - 01/07/18 1235    Clinical Impression Statement  Pt is a 77 year old female who presents to OP PT with history of unsteady gait over the past 6-8 weeks.  She has recently seen neurologist and also c/o smaller handwriting.  Neurologist does not feel she meets criteria for Parkinson's disease at this time, but will follow up with her in 6 months.  She presents to OP PT with slowed gait velocity, decreased balance, decreased timing and coordination of gait (decreased R arm swing, decreased R step length), decreased postural stability (takes 3 steps in posterior push and release test).  She is at fall risk per DGI, TUG scores.  She would benefit from skilled PT to address the above stated deficits to decrease fall risk and improve functional mobility.    History and Personal Factors relevant to plan of care:  Hx of DM, HTN, breast cancer, IBS; unsteady gait and decreased handwriting size in past 6-8 weeks; 1 fall in the snow     Clinical Presentation  Stable    Clinical Presentation due to:  fall risk per DGI and TUG scores    Clinical Decision Making  Low    Rehab Potential  Good    PT Frequency  2x / week 1x/wk for 1 week, then 2x/wk  for 4 weeks    PT Duration  Other (comment)    PT Treatment/Interventions  ADLs/Self Care Home Management;Gait training;Stair training;Functional mobility training;Therapeutic activities;Therapeutic exercise;Balance training;Patient/family education;Neuromuscular re-education    PT Next Visit Plan  Try Sensory Organization Test (based on pt's reports of veering with gait); initiate HEP to address balance, dynamic gait    Consulted and Agree with Plan of Care  Patient       Patient will benefit from skilled therapeutic intervention in order to improve the following deficits and impairments:  Abnormal gait, Decreased balance, Decreased mobility, Difficulty walking, Postural dysfunction  Visit Diagnosis: Other abnormalities of gait and mobility  Unsteadiness  on feet     Problem List Patient Active Problem List   Diagnosis Date Noted  . Genetic testing 09/04/2017  . Family history of breast cancer   . Family history of ovarian cancer   . Fatty liver 08/05/2015  . History of breast cancer 08/04/2015  . S/P bilateral mastectomy 08/04/2015  . Osteoarthritis of left knee 08/04/2015  . Dyslipidemia 04/15/2015  . Elevated LFTs 08/10/2014  . Osteopenia 08/31/2013  . Transfusion history 08/18/2013  . IBS (irritable bowel syndrome) 08/18/2013  . Cancer of central portion of right female breast ([place]) 09/04/2010  . Joint pain 09/02/2010  . History of colonic polyps 06/06/2010  . Essential hypertension 02/16/2009  . Diabetes type 2, controlled ([place]) 08/11/2008  . Esophageal reflux 05/06/2008  . GILBERT'S SYNDROME 02/06/2007    [name] W. 01/07/2018, 4:28 PM  Frazier Butt., PT   [facility] [address] [place], [place], [address] Phone: [phone]   Fax:  [phone]  Name: KARLISSA ARON MRN: [id number] Date of Birth: 11-Aug-1941

## 2018-01-09 ENCOUNTER — Encounter: Payer: Self-pay | Admitting: Physical Therapy

## 2018-01-09 ENCOUNTER — Ambulatory Visit: Payer: Medicare Other | Admitting: Physical Therapy

## 2018-01-09 DIAGNOSIS — R2681 Unsteadiness on feet: Secondary | ICD-10-CM

## 2018-01-09 DIAGNOSIS — R2689 Other abnormalities of gait and mobility: Secondary | ICD-10-CM

## 2018-01-09 NOTE — Patient Instructions (Addendum)
Stand in corner with chair in front with each exercise   Feet Together (Compliant Surface) Arm Motion - Eyes Closed    Stand on compliant surface: ___pillow_____ with feet together. Close eyes and move arms up and down: to front. Repeat __5__ times per session. Do __1-2__ sessions per day.  Copyright  VHI. All rights reserved.  Feet Together (Compliant Surface) Arm Motion - Eyes Closed   Feet Together (Compliant Surface) Head Motion - Eyes Open    With eyes open, standing on compliant surface: ___pillow_____, feet together, move head slowly: up and down, side to side, diagnonal Repeat ___10_ times per session. Do _1-2___ sessions per day.  Copyright  VHI. All rights res

## 2018-01-09 NOTE — Therapy (Signed)
[facility] [address] [place], [place], [address] Phone: [phone]   Fax:  [phone]  Physical Therapy Treatment  Patient Details  Name: [name] MRN: [id number] Date of Birth: 12/25/1940 Referring Provider: Metta Clines, DO   Encounter Date: 01/09/2018  PT End of Session - 01/09/18 1230    Visit Number  2    Number of Visits  10    Date for PT Re-Evaluation  03/08/18    Authorization Type  UHC Medicare    PT Start Time  0935    PT Stop Time  1017    PT Time Calculation (min)  42 min       Past Medical History:  Diagnosis Date  . Breast cancer ([place]) 2012  . Diabetes mellitus   . Family history of breast cancer   . Family history of ovarian cancer   . GERD (gastroesophageal reflux disease)   . Gilbert syndrome   . Hypertension   . IBS (irritable bowel syndrome)   . Plantar fasciitis of right foot    Dr Oneta Rack  . Transfusion history 1976    Past Surgical History:  Procedure Laterality Date  . ABDOMINAL HYSTERECTOMY  1976   with BSO due to infection from [facility] IUD  . APPENDECTOMY  1976   @ [place]     [id number], [phone]  . CATARACT EXTRACTION, BILATERAL Bilateral 2018  . COLONOSCOPY W/ POLYPECTOMY  2006   negative 2011; Dr Carlean Purl  . MASTECTOMY W/ NODES PARTIAL  2012   double mastectomy with nodes taken out on right side   . PLACEMENT OF BREAST IMPLANTS  04/2011   Dr Migdalia Dk, [facility]  . TONSILLECTOMY AND ADENOIDECTOMY      There were no vitals filed for this visit.  Subjective Assessment - 01/09/18 0936    Subjective  Pt remembers that she had a fall 2016 and twisted her hip. At the time she had gone to an orthopedic and had gotten a x-ray but nothing showed up on the x-ray, but it's never felt quite right since.    Pertinent History  breast cancer, DM, HTN, Gilbert Syndrome, IBS, breast cancer    Patient Stated Goals  Pt's goal for PT is to  improve steadiness of walking.    Currently in Pain?  No/denies        SOT: see results in PLAN below.                   Balance Exercises - 01/09/18 1228      Balance Exercises: Standing   Standing Eyes Opened  Narrow base of support (BOS);Head turns;Foam/compliant surface    Standing Eyes Closed  Narrow base of support (BOS);3 reps;10 secs progressed with arm movements        PT Education - 01/09/18 1229    Education provided  Yes    Education Details  Discussed results of SOT. Inititated HEP.    Person(s) Educated  Patient    Methods  Explanation;Demonstration;Verbal cues    Comprehension  Verbalized understanding;Verbal cues required;Need further instruction          PT Long Term Goals - 01/07/18 1624      PT LONG TERM GOAL #1   Title  Pt will be independent with HEP to address balance, gait, functional mobility.  TARGET 02/08/18    Time  5    Period  Weeks    Status  New  Target Date  02/08/18      PT LONG TERM GOAL #2   Title  Pt will improve DGI to at least 20/24 for decreased fall risk.    Time  5    Period  Weeks    Status  New    Target Date  02/08/18      PT LONG TERM GOAL #3   Title  Pt will improve TUG score to less than or equal to 13.5 seconds for decreased fall risk.    Time  5    Period  Weeks    Target Date  02/08/18      PT LONG TERM GOAL #4   Title  Pt will improve gait velocity to at least 2.62 ft/sec for improved gait efficiency and safety in community.    Time  5    Period  Weeks    Status  New    Target Date  02/08/18      PT LONG TERM GOAL #5   Title  Pt will verbalize understanding of fall prevention in home environment.    Time  5    Period  Weeks    Status  New    Target Date  02/08/18            Plan - 01/09/18 1230    Clinical Impression Statement  Results of SOT:  Sensory Analysis:  above normal- somatosensory and preferential, below normal- vision, vestibular.  Composite score: 52.  Pt did  demonstrate ability to quickly adjust balance righting reactions even after falls for conditions 4-6.  Initiated corner balance exercises on compliant surface; pt required intermittent UE support with feet together.                                                            Rehab Potential  Good    PT Frequency  2x / week 1x/wk for 1 week, then 2x/wk for 4 weeks    PT Duration  Other (comment)    PT Treatment/Interventions  ADLs/Self Care Home Management;Gait training;Stair training;Functional mobility training;Therapeutic activities;Therapeutic exercise;Balance training;Patient/family education;Neuromuscular re-education    PT Next Visit Plan  Review HEP to address balance, dynamic gait    Consulted and Agree with Plan of Care  Patient       Patient will benefit from skilled therapeutic intervention in order to improve the following deficits and impairments:  Abnormal gait, Decreased balance, Decreased mobility, Difficulty walking, Postural dysfunction  Visit Diagnosis: Other abnormalities of gait and mobility  Unsteadiness on feet     Problem List Patient Active Problem List   Diagnosis Date Noted  . Genetic testing 09/04/2017  . Family history of breast cancer   . Family history of ovarian cancer   . Fatty liver 08/05/2015  . History of breast cancer 08/04/2015  . S/P bilateral mastectomy 08/04/2015  . Osteoarthritis of left knee 08/04/2015  . Dyslipidemia 04/15/2015  . Elevated LFTs 08/10/2014  . Osteopenia 08/31/2013  . Transfusion history 08/18/2013  . IBS (irritable bowel syndrome) 08/18/2013  . Cancer of central portion of right female breast ([place]) 09/04/2010  . Joint pain 09/02/2010  . History of colonic polyps 06/06/2010  . Essential hypertension 02/16/2009  . Diabetes type 2, controlled ([place]) 08/11/2008  . Esophageal reflux 05/06/2008  . GILBERT'S SYNDROME  02/06/2007    Bjorn Loser 01/09/2018, 12:36 PM  [facility] [address] [place], [place], [address] Phone: [phone]   Fax:  [phone]  Name: [name] MRN: [id number] Date of Birth: Jan 24, 1941

## 2018-01-14 ENCOUNTER — Encounter: Payer: Self-pay | Admitting: Physical Therapy

## 2018-01-14 ENCOUNTER — Encounter: Payer: Self-pay | Admitting: Neurology

## 2018-01-14 ENCOUNTER — Ambulatory Visit: Payer: Medicare Other | Admitting: Physical Therapy

## 2018-01-14 DIAGNOSIS — R2689 Other abnormalities of gait and mobility: Secondary | ICD-10-CM | POA: Diagnosis not present

## 2018-01-14 DIAGNOSIS — R2681 Unsteadiness on feet: Secondary | ICD-10-CM

## 2018-01-14 NOTE — Patient Instructions (Signed)
Feet Together (Compliant Surface) Arm Motion - Eyes Closed    Stand on compliant surface: ___pillow_____ with feet together. Close eyes and move arms up and down: to front. Repeat __5__ times per session. Do __1-2__ sessions per day.  Copyright  VHI. All rights reserved.    Feet Together (Compliant Surface) Head Motion - Eyes Open    With eyes open, standing on compliant surface: ___pillow_____, feet together, move head slowly: up and down, side to side, diagnonal Repeat ___10_ times per session. Do _1-2___ sessions per day.  Then repeat with EYES CLOSED, holding onto the chair for support to stay steady.

## 2018-01-14 NOTE — Therapy (Signed)
[facility] [address] [place], [place], [address] Phone: [phone]   Fax:  [phone]  Physical Therapy Treatment  Patient Details  Name: [name] MRN: [id number] Date of Birth: Nov 12, 1940 Referring Provider: Metta Clines, DO   Encounter Date: 01/14/2018  PT End of Session - 01/14/18 1222    Visit Number  3    Number of Visits  10    Date for PT Re-Evaluation  03/08/18    Authorization Type  UHC Medicare    PT Start Time  4431    PT Stop Time  1102    PT Time Calculation (min)  39 min    Activity Tolerance  Patient tolerated treatment well    Behavior During Therapy  [facility] for tasks assessed/performed       Past Medical History:  Diagnosis Date  . Breast cancer ([place]) 2012  . Diabetes mellitus   . Family history of breast cancer   . Family history of ovarian cancer   . GERD (gastroesophageal reflux disease)   . Gilbert syndrome   . Hypertension   . IBS (irritable bowel syndrome)   . Plantar fasciitis of right foot    Dr Oneta Rack  . Transfusion history 1976    Past Surgical History:  Procedure Laterality Date  . ABDOMINAL HYSTERECTOMY  1976   with BSO due to infection from [facility] IUD  . APPENDECTOMY  1976   @ [place]     [id number], [phone]  . CATARACT EXTRACTION, BILATERAL Bilateral 2018  . COLONOSCOPY W/ POLYPECTOMY  2006   negative 2011; Dr Carlean Purl  . MASTECTOMY W/ NODES PARTIAL  2012   double mastectomy with nodes taken out on right side   . PLACEMENT OF BREAST IMPLANTS  04/2011   Dr Migdalia Dk, [facility]  . TONSILLECTOMY AND ADENOIDECTOMY      There were no vitals filed for this visit.  Subjective Assessment - 01/14/18 1025    Subjective  No pain.  Tried to do the exercises, but didn't have a great pillow.    Pertinent History  breast cancer, DM, HTN, Gilbert Syndrome, IBS, breast cancer    Patient Stated Goals  Pt's goal for PT is to improve  steadiness of walking.    Currently in Pain?  No/denies                            Balance Exercises - 01/14/18 1043      Balance Exercises: Standing   Standing Eyes Opened  Narrow base of support (BOS);Foam/compliant surface;Head turns;5 reps Head nods    Standing Eyes Closed  Narrow base of support (BOS);Foam/compliant surface;5 reps;Head turns Head nods, head diagonals UE support    Wall Bumps  Hip    Wall Bumps-Hips  Anterior/posterior;Eyes opened;10 reps    Rockerboard  Anterior/posterior;EO;UE support;Head turns hip/ankle strategy, arm swing, head nods-has post lean    Gait with Head Turns  Forward;Intermittent upper extremity support;3 reps in parallel bars    Tandem Gait  Forward;Retro;Upper extremity support;3 reps parallel bars    Partial Tandem Stance  Eyes open;Foam/compliant surface;Upper extremity support 2;5 reps Head turns, nods; then EC x 10 seconds; 2 sets     Retro Gait  Upper extremity support;3 reps forward/back in parallel bars    Marching Limitations  marching forward, walking back, 3 reps in parallel bars    Heel Raises Limitations  10 reps  Toe Raise Limitations  10 reps         PT Education - 01/14/18 1221    Education provided  Yes    Education Details  Updated HEP to include feet together with head movements with EC; also provided instructions to hold onto support to steady herself.    Person(s) Educated  Patient    Methods  Explanation;Demonstration;Handout    Comprehension  Verbalized understanding;Returned demonstration          PT Long Term Goals - 01/07/18 1624      PT LONG TERM GOAL #1   Title  Pt will be independent with HEP to address balance, gait, functional mobility.  TARGET 02/08/18    Time  5    Period  Weeks    Status  New    Target Date  02/08/18      PT LONG TERM GOAL #2   Title  Pt will improve DGI to at least 20/24 for decreased fall risk.    Time  5    Period  Weeks    Status  New    Target Date   02/08/18      PT LONG TERM GOAL #3   Title  Pt will improve TUG score to less than or equal to 13.5 seconds for decreased fall risk.    Time  5    Period  Weeks    Target Date  02/08/18      PT LONG TERM GOAL #4   Title  Pt will improve gait velocity to at least 2.62 ft/sec for improved gait efficiency and safety in community.    Time  5    Period  Weeks    Status  New    Target Date  02/08/18      PT LONG TERM GOAL #5   Title  Pt will verbalize understanding of fall prevention in home environment.    Time  5    Period  Weeks    Status  New    Target Date  02/08/18            Plan - 01/14/18 1223    Clinical Impression Statement  Briefly again explained results of SOT, especially in regards to real-life scenarios.  Reviewed and updated HEP.  Worked on compliant surfaces, dynamic activities with use of visual targets, and hip/ankle strategy work.  Pt tends to have difficulty with hip/ankle strategy work on rockerboard, often correcting by reaching to parallel bars with hands with posterior lean.  Will conitnue to beneift from additional skilled PT to address balance and gait.    Rehab Potential  Good    PT Frequency  2x / week 1x/wk for 1 week, then 2x/wk for 4 weeks    PT Duration  Other (comment)    PT Treatment/Interventions  ADLs/Self Care Home Management;Gait training;Stair training;Functional mobility training;Therapeutic activities;Therapeutic exercise;Balance training;Patient/family education;Neuromuscular re-education    PT Next Visit Plan  Review HEP to address balance; add to HEP dynamic balance activities at counter; gait activities     Consulted and Agree with Plan of Care  Patient       Patient will benefit from skilled therapeutic intervention in order to improve the following deficits and impairments:  Abnormal gait, Decreased balance, Decreased mobility, Difficulty walking, Postural dysfunction  Visit Diagnosis: Unsteadiness on feet  Other abnormalities of  gait and mobility     Problem List Patient Active Problem List   Diagnosis Date Noted  . Genetic testing 09/04/2017  .  Family history of breast cancer   . Family history of ovarian cancer   . Fatty liver 08/05/2015  . History of breast cancer 08/04/2015  . S/P bilateral mastectomy 08/04/2015  . Osteoarthritis of left knee 08/04/2015  . Dyslipidemia 04/15/2015  . Elevated LFTs 08/10/2014  . Osteopenia 08/31/2013  . Transfusion history 08/18/2013  . IBS (irritable bowel syndrome) 08/18/2013  . Cancer of central portion of right female breast ([place]) 09/04/2010  . Joint pain 09/02/2010  . History of colonic polyps 06/06/2010  . Essential hypertension 02/16/2009  . Diabetes type 2, controlled ([place]) 08/11/2008  . Esophageal reflux 05/06/2008  . GILBERT'S SYNDROME 02/06/2007    Porschea Borys W. 01/14/2018, 12:27 PM Frazier Butt., PT  [facility] [address] [place], [place], [address] Phone: [phone]   Fax:  [phone]  Name: [name] MRN: [id number] Date of Birth: 1941-03-11

## 2018-01-16 ENCOUNTER — Ambulatory Visit: Payer: Medicare Other | Attending: Neurology | Admitting: Physical Therapy

## 2018-01-16 ENCOUNTER — Encounter: Payer: Self-pay | Admitting: Physical Therapy

## 2018-01-16 DIAGNOSIS — R2681 Unsteadiness on feet: Secondary | ICD-10-CM | POA: Insufficient documentation

## 2018-01-16 DIAGNOSIS — R2689 Other abnormalities of gait and mobility: Secondary | ICD-10-CM | POA: Insufficient documentation

## 2018-01-16 NOTE — Therapy (Signed)
[facility] [address] [place], [place], [address] Phone: [phone]   Fax:  [phone]  Physical Therapy Treatment  Patient Details  Name: [name] MRN: [id number] Date of Birth: [birth year] Referring Provider: Metta Clines, DO   Encounter Date: 01/16/2018  PT End of Session - 01/16/18 1206    Visit Number  4    Number of Visits  10    Date for PT Re-Evaluation  03/08/18    Authorization Type  [employer] Medicare    PT Start Time  1105    PT Stop Time  1145    PT Time Calculation (min)  40 min    Activity Tolerance  Patient tolerated treatment well    Behavior During Therapy  [facility] for tasks assessed/performed       Past Medical History:  Diagnosis Date  . Breast cancer ([place]) 2012  . Diabetes mellitus   . Family history of breast cancer   . Family history of ovarian cancer   . GERD (gastroesophageal reflux disease)   . Gilbert syndrome   . Hypertension   . IBS (irritable bowel syndrome)   . Plantar fasciitis of right foot    Dr Oneta Rack  . Transfusion history 1976    Past Surgical History:  Procedure Laterality Date  . ABDOMINAL HYSTERECTOMY  1976   with BSO due to infection from [facility] IUD  . APPENDECTOMY  1976   @ [place]     [id number], [phone]  . CATARACT EXTRACTION, BILATERAL Bilateral 2018  . COLONOSCOPY W/ POLYPECTOMY  2006   negative 2011; Dr Carlean Purl  . MASTECTOMY W/ NODES PARTIAL  2012   double mastectomy with nodes taken out on right side   . PLACEMENT OF BREAST IMPLANTS  04/2011   Dr Migdalia Dk, [facility]  . TONSILLECTOMY AND ADENOIDECTOMY      There were no vitals filed for this visit.  Subjective Assessment - 01/16/18 1105    Subjective  This should be my last visit.  Pt's IBS has kicked in and pt is exhausted.    Pertinent History  breast cancer, DM, HTN, Gilbert Syndrome, IBS, breast cancer    Patient Stated Goals  Pt's goal for PT is to  improve steadiness of walking.    Currently in Pain?  No/denies         [facility] PT Assessment - 01/16/18 0001      Dynamic Gait Index   Level Surface  Normal    Change in Gait Speed  Normal    Gait with Horizontal Head Turns  Normal    Gait with Vertical Head Turns  Normal    Gait and Pivot Turn  Normal    Step Over Obstacle  Normal    Step Around Obstacles  Normal    Steps  Moderate Impairment    Total Score  22    DGI comment:  Scores <19/24 indicate increased fall risk      Timed Up and Go Test   TUG  Normal TUG    Normal TUG (seconds)  10.59    TUG Comments  Scores >13.5-15 seconds indicate increased fall risk.                   [facility] Adult PT Treatment/Exercise - 01/16/18 0001      Ambulation/Gait   Ambulation/Gait  Yes    Ambulation/Gait Assistance  6: Modified independent (Device/Increase time)    Ambulation Distance (Feet)  100 Feet    Assistive device  None    Gait Pattern  Step-through pattern;Decreased arm swing - right;Decreased step length - right;Decreased trunk rotation;Decreased hip/knee flexion - left;    Ambulation Surface  Level;Indoor    Gait velocity  9.0 sec= 3.6 ft/sec          Balance Exercises - 01/16/18 1203      Balance Exercises: Standing   Tandem Stance  Eyes open;Intermittent upper extremity support;2 reps;30 secs progressed to tandem walk, intermittent UE support    SLS  Eyes open;Intermittent upper extremity support;2 reps;15 secs    Marching Limitations  marching forward, high knees, intermittent UE suppport        PT Education - 01/16/18 1205    Education provided  Yes    Education Details  Fall prevention; Results of LTGs; updated HEP and explained how pt can progress exercises for balance;    Person(s) Educated  Patient    Methods  Explanation;Handout    Comprehension  Verbalized understanding          PT Long Term Goals - 01/16/18 1206      PT LONG TERM GOAL #1   Title  Pt will be independent with HEP to  address balance, gait, functional mobility.  TARGET 02/08/18    Baseline  Met 01/16/18.    Time  5    Period  Weeks    Status  Achieved      PT LONG TERM GOAL #2   Title  Pt will improve DGI to at least 20/24 for decreased fall risk.    Baseline  Met 01/16/18. Score 22/24.    Time  5    Period  Weeks    Status  Achieved      PT LONG TERM GOAL #3   Title  Pt will improve TUG score to less than or equal to 13.5 seconds for decreased fall risk.    Baseline  Met 01/16/18. Score 10.59 sec    Time  5    Period  Weeks    Status  Achieved      PT LONG TERM GOAL #4   Title  Pt will improve gait velocity to at least 2.62 ft/sec for improved gait efficiency and safety in community.    Baseline  Met 01/16/18.  Gait velocity 3.6 ft/sec.    Time  5    Period  Weeks    Status  Achieved      PT LONG TERM GOAL #5   Title  Pt will verbalize understanding of fall prevention in home environment.    Baseline  Met 01/16/18.    Time  5    Period  Weeks    Status  Achieved            Plan - 01/16/18 1209    Clinical Impression Statement  Pt wanted this visit to be her last visit due to finances. Pt met all LTGs.  Updated HEP with standing balance exercises; pt required intermittent UE support with single leg stance and tandem exercises.    Rehab Potential  Good    PT Frequency  2x / week 1x/wk for 1 week, then 2x/wk for 4 weeks    PT Duration  Other (comment)    PT Treatment/Interventions  ADLs/Self Care Home Management;Gait training;Stair training;Functional mobility training;Therapeutic activities;Therapeutic exercise;Balance training;Patient/family education;Neuromuscular re-education    PT Next Visit Plan  send d/c to MD    Consulted and Agree with Plan of Care  Patient       Patient will benefit from skilled therapeutic intervention in order to improve the following deficits and impairments:  Abnormal gait, Decreased balance, Decreased mobility, Difficulty walking, Postural dysfunction  Visit  Diagnosis: Unsteadiness on feet  Other abnormalities of gait and mobility     Problem List Patient Active Problem List   Diagnosis Date Noted  . Genetic testing 09/04/2017  . Family history of breast cancer   . Family history of ovarian cancer   . Fatty liver 08/05/2015  . History of breast cancer 08/04/2015  . S/P bilateral mastectomy 08/04/2015  . Osteoarthritis of left knee 08/04/2015  . Dyslipidemia 04/15/2015  . Elevated LFTs 08/10/2014  . Osteopenia 08/31/2013  . Transfusion history 08/18/2013  . IBS (irritable bowel syndrome) 08/18/2013  . Cancer of central portion of right female breast ([place]) 09/04/2010  . Joint pain 09/02/2010  . History of colonic polyps 06/06/2010  . Essential hypertension 02/16/2009  . Diabetes type 2, controlled ([place]) 08/11/2008  . Esophageal reflux 05/06/2008  . [facility] SYNDROME 02/06/2007   Bjorn Loser, PTA  01/16/18, 12:12 PM [facility] [address] [place], [place], [address] Phone: [phone]   Fax:  [phone]  Name: PEYTIN DECHERT MRN: [id number] Date of Birth: Oct 29, 1940

## 2018-01-16 NOTE — Patient Instructions (Addendum)
   Tandem Stance    Right foot in front of left, heel touching toe both feet "straight ahead". Stand on Foot Triangle of Support with both feet. Balance in this position _30__ seconds. Do with left foot in front of right x2  Copyright  VHI. All rights reserved.  Tandem Walking    Walk with each foot directly in front of other, heel of one foot touching toes of other foot with each step. Both feet straight ahead.   Copyright  VHI. All rights reserved.  Single Leg - Eyes Open    Tapping/ Holding support, lift right leg while maintaining balance over other leg. Progress to removing hands from support surface for longer periods of time. Hold__15__ seconds. Repeat __2-3__ times per session. Do __1__ sessions per day.  Copyright  VHI. All rights reserved.  Marching forward along counter or in Place: Varied Surfaces    Marching forward along counter or in place, slowly lifting knees toward ceiling. Repeat _4___ times per session. Do _1___ sessions per day. Repeat ____ times with eyes closed. Repeat on compliant surface: ________.

## 2018-02-19 NOTE — Progress Notes (Signed)
Subjective:    Patient ID: [name], female    DOB: 01-Jun-1941, 77 y.o.   MRN: [id number]  HPI The patient is here for follow up.  Diabetes: She is taking her medication daily as prescribed. She is compliant with a diabetic diet. She is not exercising regularly. She checks her feet daily and denies foot lesions. She is up-to-date with an ophthalmology examination.   Hypertension: She is taking her medication daily. She is compliant with a low sodium diet.  She denies chest pain, palpitations, edema, shortness of breath and regular headaches. She is not exercising regularly.  She does not monitor her blood pressure at home.    Hypertriglyceridemia: She has been eating better.  She is not currently exercising, but plans on doing more exercises at home and trying to start walking again.  She is not on any cholesterol medication.  CKD:  She drinks a good amount of water.  She does not take nsaids.    Low energy:  She has no energy.  She feels her sleep is good and she feels good when she first wakes up.  She is currently not exercising regularly, but plans on starting and she thinks that will help her energy level.  Poor balance: She did see Dr. Tomi Likens regarding her poor balance.  He did not think that she had Parkinson's disease.  He wondered if her balance was attributed to arthritis or possibly underlying diabetic polyneuropathy.  She did do some physical therapy and will continue to do those exercises at home.  She will follow-up with him in 6 months for reevaluation.  She denies any pain in the feet except for on occasion.  She denies any numbness or tingling.  She is using a cane when she ambulates.   Medications and allergies reviewed with patient and updated if appropriate.  Patient Active Problem List   Diagnosis Date Noted  . Genetic testing 09/04/2017  . Family history of breast cancer   . Family history of ovarian cancer   . Fatty liver 08/05/2015  . History of breast  cancer 08/04/2015  . S/P bilateral mastectomy 08/04/2015  . Osteoarthritis of left knee 08/04/2015  . Dyslipidemia 04/15/2015  . Elevated LFTs 08/10/2014  . Osteopenia 08/31/2013  . Transfusion history 08/18/2013  . IBS (irritable bowel syndrome) 08/18/2013  . Cancer of central portion of right female breast ([place]) 09/04/2010  . Joint pain 09/02/2010  . History of colonic polyps 06/06/2010  . Essential hypertension 02/16/2009  . Diabetes type 2, controlled ([place]) 08/11/2008  . Esophageal reflux 05/06/2008  . GILBERT'S SYNDROME 02/06/2007    Current Outpatient Medications on File Prior to Visit  Medication Sig Dispense Refill  . Ascorbic Acid (VITAMIN C) 1000 MG tablet Take 1,000 mg by mouth daily.    . cloNIDine (CATAPRES) 0.2 MG tablet Take 1 tablet (0.2 mg total) by mouth 2 (two) times daily. 180 tablet 3  . Cyanocobalamin (VITAMIN B-12) 5000 MCG SUBL Place under the tongue.    Marland Kitchen losartan-hydrochlorothiazide (HYZAAR) 100-12.5 MG tablet Take 1 tablet by mouth daily. 90 tablet 3  . metFORMIN (GLUCOPHAGE) 500 MG tablet TAKE  2  TABLETS TWICE A DAY WITH MEALS (BREAKFAST AND SUPPER) 360 tablet 1  . Multiple Vitamin (MULTIVITAMIN) capsule Take 1 capsule by mouth daily.      . Omega-3 Fatty Acids (FISH OIL CONCENTRATE) 1000 MG CAPS Take by mouth 2 (two) times daily.     Marland Kitchen oxybutynin (OXYTROL) 3.9 MG/24HR Place 1  patch onto the skin 2 (two) times a week.      . Probiotic Product (ALIGN) 4 MG CAPS Take by mouth as needed.     . ranitidine (ZANTAC) 150 MG tablet Take 150 mg by mouth 2 (two) times daily.     No current facility-administered medications on file prior to visit.     Past Medical History:  Diagnosis Date  . Breast cancer ([place]) 2012  . Diabetes mellitus   . Family history of breast cancer   . Family history of ovarian cancer   . GERD (gastroesophageal reflux disease)   . Gilbert syndrome   . Hypertension   . IBS (irritable bowel syndrome)   . Plantar fasciitis of right  foot    Dr Oneta Rack  . Transfusion history 1976    Past Surgical History:  Procedure Laterality Date  . ABDOMINAL HYSTERECTOMY  1976   with BSO due to infection from [facility] IUD  . APPENDECTOMY  1976   @ [place]     [id number], [phone]  . CATARACT EXTRACTION, BILATERAL Bilateral 2018  . COLONOSCOPY W/ POLYPECTOMY  2006   negative 2011; Dr Carlean Purl  . MASTECTOMY W/ NODES PARTIAL  2012   double mastectomy with nodes taken out on right side   . PLACEMENT OF BREAST IMPLANTS  04/2011   Dr Migdalia Dk, [facility]  . TONSILLECTOMY AND ADENOIDECTOMY      Social History   Socioeconomic History  . Marital status: Married    Spouse name: Not on file  . Number of children: Not on file  . Years of education: Not on file  . Highest education level: Not on file  Occupational History  . Not on file  Social Needs  . Financial resource strain: Not hard at all  . Food insecurity:    Worry: Never true    Inability: Never true  . Transportation needs:    Medical: No    Non-medical: No  Tobacco Use  . Smoking status: Former Smoker    Last attempt to quit: 09/18/1977    Years since quitting: 40.4  . Smokeless tobacco: Never Used  . Tobacco comment: smoked 1957-1979 , up to 1 ppd  Substance and Sexual Activity  . Alcohol use: No    Comment:  very rarely  . Drug use: No  . Sexual activity: Not Currently    Birth control/protection: Surgical  Lifestyle  . Physical activity:    Days per week: 0 days    Minutes per session: 0 min  . Stress: Not at all  Relationships  . Social connections:    Talks on phone: More than three times a week    Gets together: More than three times a week    Attends religious service: More than 4 times per year    Active member of club or organization: Yes    Attends meetings of clubs or organizations: More than 4 times per year    Relationship status: Married  Other Topics Concern  . Not on file  Social History Narrative    Walking for exercise    Family History  Problem Relation Age of Onset  . Heart attack Father 44  . Breast cancer Sister 50        bilateral   . Heart failure Sister        PMH intensive chemotherapy  . Hypertension Sister   . Stroke Sister 19  . Stroke Mother  TMI  . Breast cancer Maternal Aunt 47  . Diabetes Maternal Grandmother   . Ovarian cancer Paternal Grandmother   . Breast cancer Maternal Aunt 77    Review of Systems  Constitutional: Positive for fatigue. Negative for chills and fever.  Respiratory: Negative for cough, shortness of breath and wheezing.   Cardiovascular: Negative for chest pain, palpitations and leg swelling.  Neurological: Negative for light-headedness and headaches.  Psychiatric/Behavioral: Negative for sleep disturbance.       Objective:   Vitals:   02/20/18 1107  BP: 132/76  Pulse: 71  Resp: 16  Temp: 97.9 F (36.6 C)  SpO2: 99%   BP Readings from Last 3 Encounters:  02/20/18 132/76  02/20/18 132/76  12/19/17 108/60   Wt Readings from Last 3 Encounters:  02/20/18 166 lb (75.3 kg)  02/20/18 166 lb (75.3 kg)  12/19/17 169 lb 3.2 oz (76.7 kg)   Body mass index is 27.62 kg/m.   Physical Exam    Constitutional: Appears well-developed and well-nourished. No distress.  HENT:  Head: Normocephalic and atraumatic.  Neck: Neck supple. No tracheal deviation present. No thyromegaly present.  No cervical lymphadenopathy Cardiovascular: Normal rate, regular rhythm and normal heart sounds.   No murmur heard. No carotid bruit .  No edema Pulmonary/Chest: Effort normal and breath sounds normal. No respiratory distress. No has no wheezes. No rales.  Skin: Skin is warm and dry. Not diaphoretic.  Psychiatric: Normal mood and affect. Behavior is normal.      Assessment & Plan:    See Problem List for Assessment and Plan of chronic medical problems.

## 2018-02-19 NOTE — Progress Notes (Addendum)
Subjective:   [name] is a 77 y.o. female who presents for Medicare Annual (Subsequent) preventive examination.  Review of Systems:  No ROS.  Medicare Wellness Visit. Additional risk factors are reflected in the social history.  Cardiac Risk Factors include: advanced age (>31men, >67 women);diabetes mellitus Sleep patterns: gets up 1 times nightly to void and sleeps 7 hours nightly.    Home Safety/Smoke Alarms: Feels safe in home. Smoke alarms in place.  Living environment; residence and Firearm Safety: 1-story house/ trailer, equipment: Radio producer, Type: [place], no firearms. Lives with husband, no needs for DME, good support system  Seat Belt Safety/Bike Helmet: Wears seat belt.      Objective:     Vitals: BP 132/76   Pulse 71   Temp 97.9 F (36.6 C)   Resp 17   Ht 5\' 5"  (1.651 m)   Wt 166 lb (75.3 kg)   SpO2 99%   BMI 27.62 kg/m   Body mass index is 27.62 kg/m.  Advanced Directives 02/20/2018 01/07/2018 08/14/2016 08/16/2015 08/17/2014  Does Patient Have a Medical Advance Directive? Yes Yes No No Yes  Type of Paramedic of [place];Living will [place];Living will - - -  Copy of [place] in Chart? No - copy requested - - - No - copy requested    Tobacco Social History   Tobacco Use  Smoking Status Former Smoker  . Last attempt to quit: 09/18/1977  . Years since quitting: 40.4  Smokeless Tobacco Never Used  Tobacco Comment   smoked 1957-1979 , up to 1 ppd     Counseling given: Not Answered Comment: smoked 1957-1979 , up to 1 ppd  Past Medical History:  Diagnosis Date  . Breast cancer ([place]) 2012  . Diabetes mellitus   . Family history of breast cancer   . Family history of ovarian cancer   . GERD (gastroesophageal reflux disease)   . Gilbert syndrome   . Hypertension   . IBS (irritable bowel syndrome)   . Plantar fasciitis of right foot    Dr Oneta Rack  . Transfusion history 1976    Past Surgical History:  Procedure Laterality Date  . ABDOMINAL HYSTERECTOMY  1976   with BSO due to infection from [facility] IUD  . APPENDECTOMY  1976   @ [place]     [id number], [phone]  . CATARACT EXTRACTION, BILATERAL Bilateral 2018  . COLONOSCOPY W/ POLYPECTOMY  2006   negative 2011; Dr Carlean Purl  . MASTECTOMY W/ NODES PARTIAL  2012   double mastectomy with nodes taken out on right side   . PLACEMENT OF BREAST IMPLANTS  04/2011   Dr Migdalia Dk, [facility]  . TONSILLECTOMY AND ADENOIDECTOMY     Family History  Problem Relation Age of Onset  . Heart attack Father 65  . Breast cancer Sister 3        bilateral   . Heart failure Sister        PMH intensive chemotherapy  . Hypertension Sister   . Stroke Sister 38  . Stroke Mother        TMI  . Breast cancer Maternal Aunt 55  . Diabetes Maternal Grandmother   . Ovarian cancer Paternal Grandmother   . Breast cancer Maternal Aunt 77   Social History   Socioeconomic History  . Marital status: Married    Spouse name: Not on file  . Number of children: Not on file  .  Years of education: Not on file  . Highest education level: Not on file  Occupational History  . Not on file  Social Needs  . Financial resource strain: Not hard at all  . Food insecurity:    Worry: Never true    Inability: Never true  . Transportation needs:    Medical: No    Non-medical: No  Tobacco Use  . Smoking status: Former Smoker    Last attempt to quit: 09/18/1977    Years since quitting: 40.4  . Smokeless tobacco: Never Used  . Tobacco comment: smoked 1957-1979 , up to 1 ppd  Substance and Sexual Activity  . Alcohol use: No    Comment:  very rarely  . Drug use: No  . Sexual activity: Not Currently    Birth control/protection: Surgical  Lifestyle  . Physical activity:    Days per week: 0 days    Minutes per session: 0 min  . Stress: Not at all  Relationships  . Social connections:    Talks on phone: More  than three times a week    Gets together: More than three times a week    Attends religious service: More than 4 times per year    Active member of club or organization: Yes    Attends meetings of clubs or organizations: More than 4 times per year    Relationship status: Married  Other Topics Concern  . Not on file  Social History Narrative   Walking for exercise    Outpatient Encounter Medications as of 02/20/2018  Medication Sig  . Ascorbic Acid (VITAMIN C) 1000 MG tablet Take 1,000 mg by mouth daily.  . cloNIDine (CATAPRES) 0.2 MG tablet Take 1 tablet (0.2 mg total) by mouth 2 (two) times daily.  . Cyanocobalamin (VITAMIN B-12) 5000 MCG SUBL Place under the tongue.  Marland Kitchen losartan-hydrochlorothiazide (HYZAAR) 100-12.5 MG tablet Take 1 tablet by mouth daily.  . metFORMIN (GLUCOPHAGE) 500 MG tablet TAKE  2  TABLETS TWICE A DAY WITH MEALS (BREAKFAST AND SUPPER)  . Multiple Vitamin (MULTIVITAMIN) capsule Take 1 capsule by mouth daily.    . Omega-3 Fatty Acids (FISH OIL CONCENTRATE) 1000 MG CAPS Take by mouth 2 (two) times daily.   Marland Kitchen oxybutynin (OXYTROL) 3.9 MG/24HR Place 1 patch onto the skin 2 (two) times a week.    . Probiotic Product (ALIGN) 4 MG CAPS Take by mouth as needed.   . ranitidine (ZANTAC) 150 MG tablet Take 150 mg by mouth 2 (two) times daily.   No facility-administered encounter medications on file as of 02/20/2018.     Activities of Daily Living In your present state of health, do you have any difficulty performing the following activities: 02/20/2018  Hearing? N  Vision? N  Difficulty concentrating or making decisions? N  Walking or climbing stairs? N  Dressing or bathing? N  Doing errands, shopping? N  Preparing Food and eating ? N  Using the Toilet? N  In the past six months, have you accidently leaked urine? Y  Do you have problems with loss of bowel control? N  Managing your Medications? N  Managing your Finances? N  Housekeeping or managing your Housekeeping? N    Some recent data might be hidden    Patient Care Team: Binnie Rail, MD as PCP - General (Internal Medicine)    Assessment:   This is a routine wellness examination for [name]. Physical assessment deferred to PCP.   Exercise Activities and Dietary recommendations Current Exercise  Habits: Home exercise routine(chair exercise pamphlets provided), Type of exercise: walking, Time (Minutes): 20, Frequency (Times/Week): 2, Weekly Exercise (Minutes/Week): 40, Intensity: Mild, Exercise limited by: orthopedic condition(s)  Diet (meal preparation, eat out, water intake, caffeinated beverages, dairy products, fruits and vegetables): in general, a "healthy" diet  , well balanced   Reviewed heart healthy and diabetic diet, encouraged patient to increase daily water intake.  Goals    . Patient Stated     I want to start to walk by getting up in the morning and going 3 times weekly.       Fall Risk Fall Risk  02/20/2018 12/19/2017 08/15/2017 07/24/2016 08/17/2014  Falls in the past year? No Yes No No No  Number falls in past yr: - 1 - - -  Injury with Fall? - No - - -  Risk for fall due to : Impaired mobility;Impaired balance/gait History of fall(s);Impaired balance/gait - - -  Follow up - Falls evaluation completed - - -    Depression Screen PHQ 2/9 Scores 02/20/2018 12/19/2017 08/15/2017 07/24/2016  PHQ - 2 Score 0 0 0 0     Cognitive Function MMSE - Mini Mental State Exam 02/20/2018  Orientation to time 5  Orientation to Place 5  Registration 3  Attention/ Calculation 5  Recall 3  Language- name 2 objects 2  Language- repeat 1  Language- follow 3 step command 3  Language- read & follow direction 1  Write a sentence 1  Copy design 1  Total score 30        Immunization History  Administered Date(s) Administered  . H1N1 09/15/2008  . Influenza Split 07/24/2011  . Influenza Whole 07/29/2008, 08/06/2009, 08/19/2010  . Influenza, High Dose Seasonal PF 08/18/2013, 06/18/2015,  07/24/2016, 07/10/2017  . Influenza, Seasonal, Injecte, Preservative Fre 08/14/2012  . Influenza,inj,Quad PF,6+ Mos 05/28/2014  . Pneumococcal Conjugate-13 09/07/2014  . Pneumococcal Polysaccharide-23 10/05/2011  . Tetanus 02/26/2014  . Zoster 08/19/2011   Screening Tests Health Maintenance  Topic Date Due  . DEXA SCAN  01/04/2018  . HEMOGLOBIN A1C  02/12/2018  . INFLUENZA VACCINE  04/18/2018  . FOOT EXAM  08/15/2018  . OPHTHALMOLOGY EXAM  08/16/2018  . TETANUS/TDAP  02/27/2024  . PNA vac Low Risk Adult  Completed      Plan:   Continue doing brain stimulating activities (puzzles, reading, adult coloring books, staying active) to keep memory sharp.   Continue to eat heart healthy diet (full of fruits, vegetables, whole grains, lean protein, water--limit salt, fat, and sugar intake) and increase physical activity as tolerated.   I have personally reviewed and noted the following in the patient's chart:   . Medical and social history . Use of alcohol, tobacco or illicit drugs  . Current medications and supplements . Functional ability and status . Nutritional status . Physical activity . Advanced directives . List of other physicians . Vitals . Screenings to include cognitive, depression, and falls . Referrals and appointments  In addition, I have reviewed and discussed with patient certain preventive protocols, quality metrics, and best practice recommendations. A written personalized care plan for preventive services as well as general preventive health recommendations were provided to patient.     Michiel Cowboy, RN  02/20/2018   Medical screening examination/treatment/procedure(s) were performed by non-physician practitioner and as supervising physician I was immediately available for consultation/collaboration. I agree with above. Binnie Rail, MD

## 2018-02-19 NOTE — Patient Instructions (Addendum)
  Test(s) ordered today. Your results will be released to MyChart (or called to you) after review, usually within 72hours after test completion. If any changes need to be made, you will be notified at that same time.  Medications reviewed and updated.  No changes recommended at this time.    Please followup in 6 months   

## 2018-02-20 ENCOUNTER — Encounter: Payer: Self-pay | Admitting: Internal Medicine

## 2018-02-20 ENCOUNTER — Ambulatory Visit (INDEPENDENT_AMBULATORY_CARE_PROVIDER_SITE_OTHER): Payer: Medicare Other | Admitting: *Deleted

## 2018-02-20 ENCOUNTER — Ambulatory Visit: Payer: Medicare Other | Admitting: Internal Medicine

## 2018-02-20 ENCOUNTER — Other Ambulatory Visit (INDEPENDENT_AMBULATORY_CARE_PROVIDER_SITE_OTHER): Payer: Medicare Other

## 2018-02-20 VITALS — BP 132/76 | HR 71 | Temp 97.9°F | Resp 16 | Ht 65.0 in | Wt 166.0 lb

## 2018-02-20 VITALS — BP 132/76 | HR 71 | Temp 97.9°F | Resp 17 | Ht 65.0 in | Wt 166.0 lb

## 2018-02-20 DIAGNOSIS — Z0000 Encounter for general adult medical examination without abnormal findings: Secondary | ICD-10-CM | POA: Diagnosis not present

## 2018-02-20 DIAGNOSIS — I10 Essential (primary) hypertension: Secondary | ICD-10-CM

## 2018-02-20 DIAGNOSIS — E781 Pure hyperglyceridemia: Secondary | ICD-10-CM

## 2018-02-20 DIAGNOSIS — E2839 Other primary ovarian failure: Secondary | ICD-10-CM

## 2018-02-20 DIAGNOSIS — E119 Type 2 diabetes mellitus without complications: Secondary | ICD-10-CM | POA: Diagnosis not present

## 2018-02-20 DIAGNOSIS — R2689 Other abnormalities of gait and mobility: Secondary | ICD-10-CM | POA: Diagnosis not present

## 2018-02-20 HISTORY — DX: Other abnormalities of gait and mobility: R26.89

## 2018-02-20 LAB — LIPID PANEL
CHOLESTEROL: 155 mg/dL (ref 0–200)
HDL: 34 mg/dL — AB (ref >39.00)
LDL CALC: 83 mg/dL (ref 0–99)
NonHDL: 121.36
TRIGLYCERIDES: 194 mg/dL — AB (ref 0.0–149.0)
Total CHOL/HDL Ratio: 5
VLDL: 38.8 mg/dL (ref 0.0–40.0)

## 2018-02-20 LAB — COMPREHENSIVE METABOLIC PANEL
ALBUMIN: 4.1 g/dL (ref 3.5–5.2)
ALT: 48 U/L — AB (ref 0–35)
AST: 44 U/L — AB (ref 0–37)
Alkaline Phosphatase: 57 U/L (ref 39–117)
BILIRUBIN TOTAL: 1.2 mg/dL (ref 0.2–1.2)
BUN: 14 mg/dL (ref 6–23)
CALCIUM: 9.7 mg/dL (ref 8.4–10.5)
CO2: 30 mEq/L (ref 19–32)
CREATININE: 1 mg/dL (ref 0.40–1.20)
Chloride: 104 mEq/L (ref 96–112)
GFR: 57.23 mL/min — AB (ref >60.00)
Glucose, Bld: 143 mg/dL — ABNORMAL HIGH (ref 70–99)
Potassium: 4.2 mEq/L (ref 3.5–5.1)
Sodium: 142 mEq/L (ref 135–145)
Total Protein: 7 g/dL (ref 6.0–8.3)

## 2018-02-20 LAB — HEMOGLOBIN A1C: HEMOGLOBIN A1C: 7 % — AB (ref 4.6–6.5)

## 2018-02-20 NOTE — Assessment & Plan Note (Addendum)
Elevated triglycerides Statin intolerant-not currently on medication She is fasting today so we will check her lipid panel Sugars likely better controlled so hopefully triglycerides will be as well Will start walking regularly

## 2018-02-20 NOTE — Patient Instructions (Addendum)
Continue doing brain stimulating activities (puzzles, reading, adult coloring books, staying active) to keep memory sharp.   Continue to eat heart healthy diet (full of fruits, vegetables, whole grains, lean protein, water--limit salt, fat, and sugar intake) and increase physical activity as tolerated.   [name] , Thank you for taking time to come for your Medicare Wellness Visit. I appreciate your ongoing commitment to your health goals. Please review the following plan we discussed and let me know if I can assist you in the future.   These are the goals we discussed: Goals    . Patient Stated     I want to start to walk by getting up in the morning and going 3 times weekly.       This is a list of the screening recommended for you and due dates:  Health Maintenance  Topic Date Due  . DEXA scan (bone density measurement)  01/04/2018  . Hemoglobin A1C  02/12/2018  . Flu Shot  04/18/2018  . Complete foot exam   08/15/2018  . Eye exam for diabetics  08/16/2018  . Tetanus Vaccine  02/27/2024  . Pneumonia vaccines  Completed   Health Maintenance, Female Adopting a healthy lifestyle and getting preventive care can go a long way to promote health and wellness. Talk with your health care provider about what schedule of regular examinations is right for you. This is a good chance for you to check in with your provider about disease prevention and staying healthy. In between checkups, there are plenty of things you can do on your own. Experts have done a lot of research about which lifestyle changes and preventive measures are most likely to keep you healthy. Ask your health care provider for more information. Weight and diet Eat a healthy diet  Be sure to include plenty of vegetables, fruits, low-fat dairy products, and lean protein.  Do not eat a lot of foods high in solid fats, added sugars, or salt.  Get regular exercise. This is one of the most important things you can do for  your health. ? Most adults should exercise for at least 150 minutes each week. The exercise should increase your heart rate and make you sweat (moderate-intensity exercise). ? Most adults should also do strengthening exercises at least twice a week. This is in addition to the moderate-intensity exercise.  Maintain a healthy weight  Body mass index (BMI) is a measurement that can be used to identify possible weight problems. It estimates body fat based on height and weight. Your health care provider can help determine your BMI and help you achieve or maintain a healthy weight.  For females 76 years of age and older: ? A BMI below 18.5 is considered underweight. ? A BMI of 18.5 to 24.9 is normal. ? A BMI of 25 to 29.9 is considered overweight. ? A BMI of 30 and above is considered obese.  Watch levels of cholesterol and blood lipids  You should start having your blood tested for lipids and cholesterol at 76 years of age, then have this test every 5 years.  You may need to have your cholesterol levels checked more often if: ? Your lipid or cholesterol levels are high. ? You are older than 76 years of age. ? You are at high risk for heart disease.  Cancer screening Lung Cancer  Lung cancer screening is recommended for adults 69-62 years old who are at high risk for lung cancer because of a history of smoking.  A yearly low-dose CT scan of the lungs is recommended for people who: ? Currently smoke. ? Have quit within the past 15 years. ? Have at least a 30-pack-year history of smoking. A pack year is smoking an average of one pack of cigarettes a day for 1 year.  Yearly screening should continue until it has been 15 years since you quit.  Yearly screening should stop if you develop a health problem that would prevent you from having lung cancer treatment.  Breast Cancer  Practice breast self-awareness. This means understanding how your breasts normally appear and feel.  It also  means doing regular breast self-exams. Let your health care provider know about any changes, no matter how small.  If you are in your 20s or 30s, you should have a clinical breast exam (CBE) by a health care provider every 1-3 years as part of a regular health exam.  If you are 48 or older, have a CBE every year. Also consider having a breast X-ray (mammogram) every year.  If you have a family history of breast cancer, talk to your health care provider about genetic screening.  If you are at high risk for breast cancer, talk to your health care provider about having an MRI and a mammogram every year.  Breast cancer gene (BRCA) assessment is recommended for women who have family members with BRCA-related cancers. BRCA-related cancers include: ? Breast. ? Ovarian. ? Tubal. ? Peritoneal cancers.  Results of the assessment will determine the need for genetic counseling and BRCA1 and BRCA2 testing.  Cervical Cancer Your health care provider may recommend that you be screened regularly for cancer of the pelvic organs (ovaries, uterus, and vagina). This screening involves a pelvic examination, including checking for microscopic changes to the surface of your cervix (Pap test). You may be encouraged to have this screening done every 3 years, beginning at age 47.  For women ages 22-65, health care providers may recommend pelvic exams and Pap testing every 3 years, or they may recommend the Pap and pelvic exam, combined with testing for human papilloma virus (HPV), every 5 years. Some types of HPV increase your risk of cervical cancer. Testing for HPV may also be done on women of any age with unclear Pap test results.  Other health care providers may not recommend any screening for nonpregnant women who are considered low risk for pelvic cancer and who do not have symptoms. Ask your health care provider if a screening pelvic exam is right for you.  If you have had past treatment for cervical cancer or  a condition that could lead to cancer, you need Pap tests and screening for cancer for at least 20 years after your treatment. If Pap tests have been discontinued, your risk factors (such as having a new sexual partner) need to be reassessed to determine if screening should resume. Some women have medical problems that increase the chance of getting cervical cancer. In these cases, your health care provider may recommend more frequent screening and Pap tests.  Colorectal Cancer  This type of cancer can be detected and often prevented.  Routine colorectal cancer screening usually begins at 76 years of age and continues through 76 years of age.  Your health care provider may recommend screening at an earlier age if you have risk factors for colon cancer.  Your health care provider may also recommend using home test kits to check for hidden blood in the stool.  A small camera at the end  of a tube can be used to examine your colon directly (sigmoidoscopy or colonoscopy). This is done to check for the earliest forms of colorectal cancer.  Routine screening usually begins at age 68.  Direct examination of the colon should be repeated every 5-10 years through 76 years of age. However, you may need to be screened more often if early forms of precancerous polyps or small growths are found.  Skin Cancer  Check your skin from head to toe regularly.  Tell your health care provider about any new moles or changes in moles, especially if there is a change in a mole's shape or color.  Also tell your health care provider if you have a mole that is larger than the size of a pencil eraser.  Always use sunscreen. Apply sunscreen liberally and repeatedly throughout the day.  Protect yourself by wearing long sleeves, pants, a wide-brimmed hat, and sunglasses whenever you are outside.  Heart disease, diabetes, and high blood pressure  High blood pressure causes heart disease and increases the risk of  stroke. High blood pressure is more likely to develop in: ? People who have blood pressure in the high end of the normal range (130-139/85-89 mm Hg). ? People who are overweight or obese. ? People who are African American.  If you are 58-69 years of age, have your blood pressure checked every 3-5 years. If you are 57 years of age or older, have your blood pressure checked every year. You should have your blood pressure measured twice-once when you are at a hospital or clinic, and once when you are not at a hospital or clinic. Record the average of the two measurements. To check your blood pressure when you are not at a hospital or clinic, you can use: ? An automated blood pressure machine at a pharmacy. ? A home blood pressure monitor.  If you are between 41 years and 12 years old, ask your health care provider if you should take aspirin to prevent strokes.  Have regular diabetes screenings. This involves taking a blood sample to check your fasting blood sugar level. ? If you are at a normal weight and have a low risk for diabetes, have this test once every three years after 76 years of age. ? If you are overweight and have a high risk for diabetes, consider being tested at a younger age or more often. Preventing infection Hepatitis B  If you have a higher risk for hepatitis B, you should be screened for this virus. You are considered at high risk for hepatitis B if: ? You were born in a country where hepatitis B is common. Ask your health care provider which countries are considered high risk. ? Your parents were born in a high-risk country, and you have not been immunized against hepatitis B (hepatitis B vaccine). ? You have HIV or AIDS. ? You use needles to inject street drugs. ? You live with someone who has hepatitis B. ? You have had sex with someone who has hepatitis B. ? You get hemodialysis treatment. ? You take certain medicines for conditions, including cancer, organ  transplantation, and autoimmune conditions.  Hepatitis C  Blood testing is recommended for: ? Everyone born from [birth year] through 1964. ? Anyone with known risk factors for hepatitis C.  Sexually transmitted infections (STIs)  You should be screened for sexually transmitted infections (STIs) including gonorrhea and chlamydia if: ? You are sexually active and are younger than 76 years of age. ? You are  older than 76 years of age and your health care provider tells you that you are at risk for this type of infection. ? Your sexual activity has changed since you were last screened and you are at an increased risk for chlamydia or gonorrhea. Ask your health care provider if you are at risk.  If you do not have HIV, but are at risk, it may be recommended that you take a prescription medicine daily to prevent HIV infection. This is called pre-exposure prophylaxis (PrEP). You are considered at risk if: ? You are sexually active and do not regularly use condoms or know the HIV status of your partner(s). ? You take drugs by injection. ? You are sexually active with a partner who has HIV.  Talk with your health care provider about whether you are at high risk of being infected with HIV. If you choose to begin PrEP, you should first be tested for HIV. You should then be tested every 3 months for as long as you are taking PrEP. Pregnancy  If you are premenopausal and you may become pregnant, ask your health care provider about preconception counseling.  If you may become pregnant, take 400 to 800 micrograms (mcg) of folic acid every day.  If you want to prevent pregnancy, talk to your health care provider about birth control (contraception). Osteoporosis and menopause  Osteoporosis is a disease in which the bones lose minerals and strength with aging. This can result in serious bone fractures. Your risk for osteoporosis can be identified using a bone density scan.  If you are 62 years of age or  older, or if you are at risk for osteoporosis and fractures, ask your health care provider if you should be screened.  Ask your health care provider whether you should take a calcium or vitamin D supplement to lower your risk for osteoporosis.  Menopause may have certain physical symptoms and risks.  Hormone replacement therapy may reduce some of these symptoms and risks. Talk to your health care provider about whether hormone replacement therapy is right for you. Follow these instructions at home:  Schedule regular health, dental, and eye exams.  Stay current with your immunizations.  Do not use any tobacco products including cigarettes, chewing tobacco, or electronic cigarettes.  If you are pregnant, do not drink alcohol.  If you are breastfeeding, limit how much and how often you drink alcohol.  Limit alcohol intake to no more than 1 drink per day for nonpregnant women. One drink equals 12 ounces of beer, 5 ounces of wine, or 1 ounces of hard liquor.  Do not use street drugs.  Do not share needles.  Ask your health care provider for help if you need support or information about quitting drugs.  Tell your health care provider if you often feel depressed.  Tell your health care provider if you have ever been abused or do not feel safe at home. This information is not intended to replace advice given to you by your health care provider. Make sure you discuss any questions you have with your health care provider. Document Released: 03/20/2011 Document Revised: 02/10/2016 Document Reviewed: 06/08/2015 Elsevier Interactive Patient Education  [employer].

## 2018-02-20 NOTE — Assessment & Plan Note (Signed)
She has poor balance and did see Dr. Tomi Likens Poor balance thought to be related to knee arthritis or possibly underlying diabetic polyneuropathy She did do some physical therapy and will continue to those exercises at home She will try to start walking regularly She is follow-up with Dr. Tomi Likens in few months She is using a cane She denies any recent falls

## 2018-02-20 NOTE — Assessment & Plan Note (Signed)
BP well controlled Current regimen effective and well tolerated Continue current medications at current doses cmp  

## 2018-02-20 NOTE — Assessment & Plan Note (Signed)
Taking metformin as prescribed Check A1c today Continue diabetic diet She will work on starting to walk regularly and continuing her physical therapy exercises

## 2018-02-22 ENCOUNTER — Encounter: Payer: Self-pay | Admitting: Internal Medicine

## 2018-03-11 ENCOUNTER — Other Ambulatory Visit: Payer: Self-pay | Admitting: Internal Medicine

## 2018-03-20 DIAGNOSIS — Z853 Personal history of malignant neoplasm of breast: Secondary | ICD-10-CM | POA: Diagnosis not present

## 2018-03-20 DIAGNOSIS — M8588 Other specified disorders of bone density and structure, other site: Secondary | ICD-10-CM | POA: Diagnosis not present

## 2018-04-01 ENCOUNTER — Encounter: Payer: Self-pay | Admitting: Internal Medicine

## 2018-04-15 DIAGNOSIS — H04123 Dry eye syndrome of bilateral lacrimal glands: Secondary | ICD-10-CM | POA: Diagnosis not present

## 2018-04-15 DIAGNOSIS — H5213 Myopia, bilateral: Secondary | ICD-10-CM | POA: Diagnosis not present

## 2018-04-15 DIAGNOSIS — E119 Type 2 diabetes mellitus without complications: Secondary | ICD-10-CM | POA: Diagnosis not present

## 2018-04-15 DIAGNOSIS — H26493 Other secondary cataract, bilateral: Secondary | ICD-10-CM | POA: Diagnosis not present

## 2018-04-15 LAB — HM DIABETES EYE EXAM

## 2018-04-19 ENCOUNTER — Encounter: Payer: Self-pay | Admitting: Internal Medicine

## 2018-06-20 ENCOUNTER — Ambulatory Visit: Payer: Self-pay | Admitting: Neurology

## 2018-07-04 DIAGNOSIS — L218 Other seborrheic dermatitis: Secondary | ICD-10-CM | POA: Diagnosis not present

## 2018-07-04 DIAGNOSIS — D2371 Other benign neoplasm of skin of right lower limb, including hip: Secondary | ICD-10-CM | POA: Diagnosis not present

## 2018-07-04 DIAGNOSIS — L570 Actinic keratosis: Secondary | ICD-10-CM | POA: Diagnosis not present

## 2018-07-04 DIAGNOSIS — D2262 Melanocytic nevi of left upper limb, including shoulder: Secondary | ICD-10-CM | POA: Diagnosis not present

## 2018-07-04 DIAGNOSIS — D225 Melanocytic nevi of trunk: Secondary | ICD-10-CM | POA: Diagnosis not present

## 2018-07-04 DIAGNOSIS — L820 Inflamed seborrheic keratosis: Secondary | ICD-10-CM | POA: Diagnosis not present

## 2018-07-22 ENCOUNTER — Telehealth: Payer: Self-pay | Admitting: Adult Health

## 2018-07-22 NOTE — Telephone Encounter (Signed)
[place] out 11/29 - moved LTS visit to 11/26. Spoke with patient and per patient request visit cxd. Per patient she will f/u annually with her pcp. Message routed to [facility].

## 2018-07-24 ENCOUNTER — Ambulatory Visit (INDEPENDENT_AMBULATORY_CARE_PROVIDER_SITE_OTHER): Payer: Medicare Other

## 2018-07-24 DIAGNOSIS — Z23 Encounter for immunization: Secondary | ICD-10-CM | POA: Diagnosis not present

## 2018-07-24 NOTE — Progress Notes (Signed)
NEUROLOGY FOLLOW UP OFFICE NOTE  [name] [id number]  HISTORY OF PRESENT ILLNESS: *[name] is a 77 year old female with diabetes, hypertension, Gilbert's syndrome, and irritable bowel syndrome who follows up for abnormal gait and handwriting.  She is accompanied by her husband who supplements history.  UPDATE:  She went to PT for a few sessions but then stopped.  She has exercises for home.  She continues to have unsteady gait with falls.  Her legs continue to feel weak.  She has trouble standing from a chair or getting up from the floor.  HISTORY:  Since summer 2018, she has had trouble with her penmanship.  She states it is more difficult to write and that her handwriting is smaller.  She denies cramping.  In early 2019, she started having increased problems with balance.  She needs to push off in order to stand.  She feels weak in the legs.  She denies back pain or radicular pain and numbness in the feet.  She denies neck pain.  She denies freezing when initiating taking a step.  She denies dizziness or lightheadedness.  She looked up her symptoms on line and is concerned about Parkinson's disease.  She denies tremor, difficulty eating (using utensils, swallowing), drooling.  Sense of smell is intact.  She does not experience symptoms consistent with REM sleep behavior disorder.  She reports no cognitive difficulty.  There is no family history of PD.  PAST MEDICAL HISTORY: Past Medical History:  Diagnosis Date  . Breast cancer ([place]) 2012  . Diabetes mellitus   . Family history of breast cancer   . Family history of ovarian cancer   . GERD (gastroesophageal reflux disease)   . Gilbert syndrome   . Hypertension   . IBS (irritable bowel syndrome)   . Plantar fasciitis of right foot    Dr Oneta Rack  . Transfusion history 1976    MEDICATIONS: Current Outpatient Medications on File Prior to Visit  Medication Sig Dispense Refill  . Ascorbic Acid (VITAMIN C) 1000  MG tablet Take 1,000 mg by mouth daily.    . cloNIDine (CATAPRES) 0.2 MG tablet Take 1 tablet (0.2 mg total) by mouth 2 (two) times daily. 180 tablet 3  . Cyanocobalamin (VITAMIN B-12) 5000 MCG SUBL Place under the tongue.    Marland Kitchen losartan-hydrochlorothiazide (HYZAAR) 100-12.5 MG tablet Take 1 tablet by mouth daily. 90 tablet 3  . metFORMIN (GLUCOPHAGE) 500 MG tablet TAKE 2 TABLETS TWICE A DAY WITH MEALS. 360 tablet 1  . Multiple Vitamin (MULTIVITAMIN) capsule Take 1 capsule by mouth daily.      . Omega-3 Fatty Acids (FISH OIL CONCENTRATE) 1000 MG CAPS Take by mouth 2 (two) times daily.     Marland Kitchen oxybutynin (OXYTROL) 3.9 MG/24HR Place 1 patch onto the skin 2 (two) times a week.      . Probiotic Product (ALIGN) 4 MG CAPS Take by mouth as needed.     . ranitidine (ZANTAC) 150 MG tablet Take 150 mg by mouth 2 (two) times daily.     No current facility-administered medications on file prior to visit.     ALLERGIES: Allergies  Allergen Reactions  . Sulfur     Flushed, funny feeling in throat   . Exemestane Nausea Only    Other reaction(s): Dizziness (intolerance)  . Morphine And Related   . Statins Other (See Comments)    Elevated LFTs  . Ace Inhibitors     REACTION: COUGH  . Codeine  REACTION: VOMITTING    FAMILY HISTORY: Family History  Problem Relation Age of Onset  . Heart attack Father 63  . Breast cancer Sister 44        bilateral   . Heart failure Sister        PMH intensive chemotherapy  . Hypertension Sister   . Stroke Sister 24  . Stroke Mother        TMI  . Breast cancer Maternal Aunt 76  . Diabetes Maternal Grandmother   . Ovarian cancer Paternal Grandmother   . Breast cancer Maternal Aunt 77   SOCIAL HISTORY: Social History   Socioeconomic History  . Marital status: Married    Spouse name: Not on file  . Number of children: Not on file  . Years of education: Not on file  . Highest education level: Not on file  Occupational History  . Not on file  Social  Needs  . Financial resource strain: Not hard at all  . Food insecurity:    Worry: Never true    Inability: Never true  . Transportation needs:    Medical: No    Non-medical: No  Tobacco Use  . Smoking status: Former Smoker    Last attempt to quit: 09/18/1977    Years since quitting: 40.8  . Smokeless tobacco: Never Used  . Tobacco comment: smoked 1957-1979 , up to 1 ppd  Substance and Sexual Activity  . Alcohol use: No    Comment:  very rarely  . Drug use: No  . Sexual activity: Not Currently    Birth control/protection: Surgical  Lifestyle  . Physical activity:    Days per week: 0 days    Minutes per session: 0 min  . Stress: Not at all  Relationships  . Social connections:    Talks on phone: More than three times a week    Gets together: More than three times a week    Attends religious service: More than 4 times per year    Active member of club or organization: Yes    Attends meetings of clubs or organizations: More than 4 times per year    Relationship status: Married  . Intimate partner violence:    Fear of current or ex partner: No    Emotionally abused: No    Physically abused: No    Forced sexual activity: No  Other Topics Concern  . Not on file  Social History Narrative   Walking for exercise    REVIEW OF SYSTEMS: Constitutional: No fevers, chills, or sweats, no generalized fatigue, change in appetite Eyes: No visual changes, double vision, eye pain Ear, nose and throat: No hearing loss, ear pain, nasal congestion, sore throat Cardiovascular: No chest pain, palpitations Respiratory:  No shortness of breath at rest or with exertion, wheezes GastrointestinaI: No nausea, vomiting, diarrhea, abdominal pain, fecal incontinence Genitourinary:  No dysuria, urinary retention or frequency Musculoskeletal:  No neck pain, back pain Integumentary: No rash, pruritus, skin lesions Neurological: as above Psychiatric: No depression, insomnia, anxiety Endocrine: No  palpitations, fatigue, diaphoresis, mood swings, change in appetite, change in weight, increased thirst Hematologic/Lymphatic:  No purpura, petechiae. Allergic/Immunologic: no itchy/runny eyes, nasal congestion, recent allergic reactions, rashes  PHYSICAL EXAM: Blood pressure 118/70, pulse 80, height 5' 4.5" (1.638 m), weight 165 lb (74.8 kg), SpO2 93 %. General: No acute distress.  Patient appears well-groomed.   Head:  Normocephalic/atraumatic Eyes:  Fundi examined but not visualized Neck: supple, no paraspinal tenderness, full range of motion  Heart:  Regular rate and rhythm Lungs:  Clear to auscultation bilaterally Back: No paraspinal tenderness Neurological Exam: alert and oriented to person, place, and time. Attention span and concentration intact, recent and remote memory intact, fund of knowledge intact.  Speech fluent and not dysarthric, language intact.  CN II-XII intact. Bulk and tone normal, no rigidity; muscle strength 5-/5 bilateral hip flexion and knee extension.  Micrographia.  Finger-thumb tapping reduced on right. No tremor.  Sensation to pinprick and vibration intact.  Deep tendon reflexes 2+ throughout, toes downgoing.  Finger to nose testing intact.  Gait wide-based/slighthly waddling with upright posture and mildly reduced arm swing bilaterally.  Romberg negative.  IMPRESSION: 1.  Unsteady gait with micrographia.  She does exhibit bradykinesia, particularly in the right upper extremity.  She does not exhibit other symptoms of Parkinson's disease such as rigidity or tremor.  She does not exhibit the typical shuffling gait.  However, I suspect she has an underlying neuropathy that may be affecting her gait as well.  PLAN: 1.  We will start a trial of carbidopa-levodopa, titrating to 25/100 mg 3 times daily. 2.  Continue home exercises. 3.  She will follow-up in a couple of months.  25 minutes spent with the patient and her husband, over 50% spent discussing  management.  Metta Clines, DO  CC: Billey Gosling, MD

## 2018-07-25 ENCOUNTER — Ambulatory Visit: Payer: Medicare Other | Admitting: Neurology

## 2018-07-25 ENCOUNTER — Encounter: Payer: Self-pay | Admitting: Neurology

## 2018-07-25 VITALS — BP 118/70 | HR 80 | Ht 64.5 in | Wt 165.0 lb

## 2018-07-25 DIAGNOSIS — R2681 Unsteadiness on feet: Secondary | ICD-10-CM

## 2018-07-25 DIAGNOSIS — G20 Parkinson's disease: Secondary | ICD-10-CM | POA: Diagnosis not present

## 2018-07-25 DIAGNOSIS — G6289 Other specified polyneuropathies: Secondary | ICD-10-CM | POA: Diagnosis not present

## 2018-07-25 MED ORDER — CARBIDOPA-LEVODOPA 25-100 MG PO TABS: ORAL_TABLET | ORAL | 0 refills | Status: DC

## 2018-07-25 NOTE — Patient Instructions (Signed)
1.  1. Start carbidopa (Sinemet) 25/100mg  tablets.  Week 1: Take 0.5 tablet in morning and 0.5 tablet in evening/bedtime.  Week 2: Take 0.5 tablet in morning, 0.5 tablet in afternoon, and 0.5 tablet in evening/bedtime.  Week 3: Take 1 tablet three times daily.   Take the medication at the same time everyday. The medication does not get absorbed into your body as well, if you take it with protein-containing foods (meat, dairy, beans). Try taking the medication about one hour before meals. If you experience nausea by taking it on an empty stomach, you may take it with carbohydrate-containing food,such as bread.  Side effects to look out for, include dizziness, nausea, vivid dreams and hallucinations. If you experience any of these symptoms, please call us.  2.  Follow up in 2 months (or next available)

## 2018-08-13 ENCOUNTER — Encounter: Payer: Self-pay | Admitting: Adult Health

## 2018-08-16 ENCOUNTER — Encounter: Payer: Self-pay | Admitting: Adult Health

## 2018-08-22 NOTE — Patient Instructions (Addendum)
Tests ordered today. Your results will be released to [place] (or called to you) after review, usually within 72hours after test completion. If any changes need to be made, you will be notified at that same time.  All other Health Maintenance issues reviewed.   All recommended immunizations and age-appropriate screenings are up-to-date or discussed.  No immunizations administered today.   Medications reviewed and updated.  Changes include :   none    Please followup in 6 months   Health Maintenance, Female Adopting a healthy lifestyle and getting preventive care can go a long way to promote health and wellness. Talk with your health care provider about what schedule of regular examinations is right for you. This is a good chance for you to check in with your provider about disease prevention and staying healthy. In between checkups, there are plenty of things you can do on your own. Experts have done a lot of research about which lifestyle changes and preventive measures are most likely to keep you healthy. Ask your health care provider for more information. Weight and diet Eat a healthy diet  Be sure to include plenty of vegetables, fruits, low-fat dairy products, and lean protein.  Do not eat a lot of foods high in solid fats, added sugars, or salt.  Get regular exercise. This is one of the most important things you can do for your health. ? Most adults should exercise for at least 150 minutes each week. The exercise should increase your heart rate and make you sweat (moderate-intensity exercise). ? Most adults should also do strengthening exercises at least twice a week. This is in addition to the moderate-intensity exercise.  Maintain a healthy weight  Body mass index (BMI) is a measurement that can be used to identify possible weight problems. It estimates body fat based on height and weight. Your health care provider can help determine your BMI and help you achieve or maintain a  healthy weight.  For females 18 years of age and older: ? A BMI below 18.5 is considered underweight. ? A BMI of 18.5 to 24.9 is normal. ? A BMI of 25 to 29.9 is considered overweight. ? A BMI of 30 and above is considered obese.  Watch levels of cholesterol and blood lipids  You should start having your blood tested for lipids and cholesterol at 76 years of age, then have this test every 5 years.  You may need to have your cholesterol levels checked more often if: ? Your lipid or cholesterol levels are high. ? You are older than 76 years of age. ? You are at high risk for heart disease.  Cancer screening Lung Cancer  Lung cancer screening is recommended for adults 45-28 years old who are at high risk for lung cancer because of a history of smoking.  A yearly low-dose CT scan of the lungs is recommended for people who: ? Currently smoke. ? Have quit within the past 15 years. ? Have at least a 30-pack-year history of smoking. A pack year is smoking an average of one pack of cigarettes a day for 1 year.  Yearly screening should continue until it has been 15 years since you quit.  Yearly screening should stop if you develop a health problem that would prevent you from having lung cancer treatment.  Breast Cancer  Practice breast self-awareness. This means understanding how your breasts normally appear and feel.  It also means doing regular breast self-exams. Let your health care provider know about any  changes, no matter how small.  If you are in your 20s or 30s, you should have a clinical breast exam (CBE) by a health care provider every 1-3 years as part of a regular health exam.  If you are 40 or older, have a CBE every year. Also consider having a breast X-ray (mammogram) every year.  If you have a family history of breast cancer, talk to your health care provider about genetic screening.  If you are at high risk for breast cancer, talk to your health care provider about  having an MRI and a mammogram every year.  Breast cancer gene (BRCA) assessment is recommended for women who have family members with BRCA-related cancers. BRCA-related cancers include: ? Breast. ? Ovarian. ? Tubal. ? Peritoneal cancers.  Results of the assessment will determine the need for genetic counseling and BRCA1 and BRCA2 testing.  Cervical Cancer Your health care provider may recommend that you be screened regularly for cancer of the pelvic organs (ovaries, uterus, and vagina). This screening involves a pelvic examination, including checking for microscopic changes to the surface of your cervix (Pap test). You may be encouraged to have this screening done every 3 years, beginning at age 21.  For women ages 30-65, health care providers may recommend pelvic exams and Pap testing every 3 years, or they may recommend the Pap and pelvic exam, combined with testing for human papilloma virus (HPV), every 5 years. Some types of HPV increase your risk of cervical cancer. Testing for HPV may also be done on women of any age with unclear Pap test results.  Other health care providers may not recommend any screening for nonpregnant women who are considered low risk for pelvic cancer and who do not have symptoms. Ask your health care provider if a screening pelvic exam is right for you.  If you have had past treatment for cervical cancer or a condition that could lead to cancer, you need Pap tests and screening for cancer for at least 20 years after your treatment. If Pap tests have been discontinued, your risk factors (such as having a new sexual partner) need to be reassessed to determine if screening should resume. Some women have medical problems that increase the chance of getting cervical cancer. In these cases, your health care provider may recommend more frequent screening and Pap tests.  Colorectal Cancer  This type of cancer can be detected and often prevented.  Routine colorectal cancer  screening usually begins at 76 years of age and continues through 75 years of age.  Your health care provider may recommend screening at an earlier age if you have risk factors for colon cancer.  Your health care provider may also recommend using home test kits to check for hidden blood in the stool.  A small camera at the end of a tube can be used to examine your colon directly (sigmoidoscopy or colonoscopy). This is done to check for the earliest forms of colorectal cancer.  Routine screening usually begins at age 50.  Direct examination of the colon should be repeated every 5-10 years through 75 years of age. However, you may need to be screened more often if early forms of precancerous polyps or small growths are found.  Skin Cancer  Check your skin from head to toe regularly.  Tell your health care provider about any new moles or changes in moles, especially if there is a change in a mole's shape or color.  Also tell your health care provider if   you have a mole that is larger than the size of a pencil eraser.  Always use sunscreen. Apply sunscreen liberally and repeatedly throughout the day.  Protect yourself by wearing long sleeves, pants, a wide-brimmed hat, and sunglasses whenever you are outside.  Heart disease, diabetes, and high blood pressure  High blood pressure causes heart disease and increases the risk of stroke. High blood pressure is more likely to develop in: ? People who have blood pressure in the high end of the normal range (130-139/85-89 mm Hg). ? People who are overweight or obese. ? People who are African American.  If you are 18-39 years of age, have your blood pressure checked every 3-5 years. If you are 40 years of age or older, have your blood pressure checked every year. You should have your blood pressure measured twice-once when you are at a hospital or clinic, and once when you are not at a hospital or clinic. Record the average of the two measurements.  To check your blood pressure when you are not at a hospital or clinic, you can use: ? An automated blood pressure machine at a pharmacy. ? A home blood pressure monitor.  If you are between 55 years and 79 years old, ask your health care provider if you should take aspirin to prevent strokes.  Have regular diabetes screenings. This involves taking a blood sample to check your fasting blood sugar level. ? If you are at a normal weight and have a low risk for diabetes, have this test once every three years after 76 years of age. ? If you are overweight and have a high risk for diabetes, consider being tested at a younger age or more often. Preventing infection Hepatitis B  If you have a higher risk for hepatitis B, you should be screened for this virus. You are considered at high risk for hepatitis B if: ? You were born in a country where hepatitis B is common. Ask your health care provider which countries are considered high risk. ? Your parents were born in a high-risk country, and you have not been immunized against hepatitis B (hepatitis B vaccine). ? You have HIV or AIDS. ? You use needles to inject street drugs. ? You live with someone who has hepatitis B. ? You have had sex with someone who has hepatitis B. ? You get hemodialysis treatment. ? You take certain medicines for conditions, including cancer, organ transplantation, and autoimmune conditions.  Hepatitis C  Blood testing is recommended for: ? Everyone born from 1945 through 1965. ? Anyone with known risk factors for hepatitis C.  Sexually transmitted infections (STIs)  You should be screened for sexually transmitted infections (STIs) including gonorrhea and chlamydia if: ? You are sexually active and are younger than 76 years of age. ? You are older than 76 years of age and your health care provider tells you that you are at risk for this type of infection. ? Your sexual activity has changed since you were last screened  and you are at an increased risk for chlamydia or gonorrhea. Ask your health care provider if you are at risk.  If you do not have HIV, but are at risk, it may be recommended that you take a prescription medicine daily to prevent HIV infection. This is called pre-exposure prophylaxis (PrEP). You are considered at risk if: ? You are sexually active and do not regularly use condoms or know the HIV status of your partner(s). ? You take drugs by   injection. ? You are sexually active with a partner who has HIV.  Talk with your health care provider about whether you are at high risk of being infected with HIV. If you choose to begin PrEP, you should first be tested for HIV. You should then be tested every 3 months for as long as you are taking PrEP. Pregnancy  If you are premenopausal and you may become pregnant, ask your health care provider about preconception counseling.  If you may become pregnant, take 400 to 800 micrograms (mcg) of folic acid every day.  If you want to prevent pregnancy, talk to your health care provider about birth control (contraception). Osteoporosis and menopause  Osteoporosis is a disease in which the bones lose minerals and strength with aging. This can result in serious bone fractures. Your risk for osteoporosis can be identified using a bone density scan.  If you are 65 years of age or older, or if you are at risk for osteoporosis and fractures, ask your health care provider if you should be screened.  Ask your health care provider whether you should take a calcium or vitamin D supplement to lower your risk for osteoporosis.  Menopause may have certain physical symptoms and risks.  Hormone replacement therapy may reduce some of these symptoms and risks. Talk to your health care provider about whether hormone replacement therapy is right for you. Follow these instructions at home:  Schedule regular health, dental, and eye exams.  Stay current with your  immunizations.  Do not use any tobacco products including cigarettes, chewing tobacco, or electronic cigarettes.  If you are pregnant, do not drink alcohol.  If you are breastfeeding, limit how much and how often you drink alcohol.  Limit alcohol intake to no more than 1 drink per day for nonpregnant women. One drink equals 12 ounces of beer, 5 ounces of wine, or 1 ounces of hard liquor.  Do not use street drugs.  Do not share needles.  Ask your health care provider for help if you need support or information about quitting drugs.  Tell your health care provider if you often feel depressed.  Tell your health care provider if you have ever been abused or do not feel safe at home. This information is not intended to replace advice given to you by your health care provider. Make sure you discuss any questions you have with your health care provider. Document Released: 03/20/2011 Document Revised: 02/10/2016 Document Reviewed: 06/08/2015 [employer] Interactive Patient Education  2018 [employer]  

## 2018-08-22 NOTE — Progress Notes (Signed)
Subjective:    Patient ID: [name], female    DOB: 12/20/1940, 77 y.o.   MRN: [id number]  HPI She is here for a physical exam.   She has no concerns.  She has been started in sinemet and is feeling better - her balance it better and she thinks she is a little more active.   Medications and allergies reviewed with patient and updated if appropriate.  Patient Active Problem List   Diagnosis Date Noted  . Peripheral neuropathy 08/23/2018  . Poor balance 02/20/2018  . Genetic testing 09/04/2017  . Family history of breast cancer   . Family history of ovarian cancer   . Fatty liver 08/05/2015  . History of breast cancer 08/04/2015  . S/P bilateral mastectomy 08/04/2015  . Osteoarthritis of left knee 08/04/2015  . Elevated LFTs 08/10/2014  . Osteopenia 08/31/2013  . Transfusion history 08/18/2013  . IBS (irritable bowel syndrome) 08/18/2013  . 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 Joint pain 09/02/2010  . History of colonic polyps 06/06/2010  . Essential hypertension 02/16/2009  . Diabetes type 2, controlled ([place]) 08/11/2008  . Esophageal reflux 05/06/2008  . Hypertriglyceridemia 09/30/2007  . GILBERT'S SYNDROME 02/06/2007    Current Outpatient Medications on File Prior to Visit  Medication Sig Dispense Refill  . Ascorbic Acid (VITAMIN C) 1000 MG tablet Take 1,000 mg by mouth daily.    . carbidopa-levodopa (SINEMET IR) 25-100 MG tablet Take 1/2 tablet twice daily for one week, then 1/2 tablet three times daily for one week, then 1 tablet three times daily 90 tablet 0  . cloNIDine (CATAPRES) 0.2 MG tablet Take 1 tablet (0.2 mg total) by mouth 2 (two) times daily. 180 tablet 3  . Cyanocobalamin (VITAMIN B-12) 5000 MCG SUBL Place under the tongue.    Marland Kitchen losartan-hydrochlorothiazide (HYZAAR) 100-12.5 MG tablet Take 1 tablet by mouth daily. 90 tablet 3  . metFORMIN (GLUCOPHAGE) 500 MG tablet TAKE 2 TABLETS TWICE A DAY WITH MEALS. 360 tablet  1  . Multiple Vitamin (MULTIVITAMIN) capsule Take 1 capsule by mouth daily.      . Omega-3 Fatty Acids (FISH OIL CONCENTRATE) 1000 MG CAPS Take by mouth 2 (two) times daily.     Marland Kitchen oxybutynin (OXYTROL) 3.9 MG/24HR Place 1 patch onto the skin 2 (two) times a week.      . Probiotic Product (ALIGN) 4 MG CAPS Take by mouth as needed.     . ranitidine (ZANTAC) 150 MG tablet Take 150 mg by mouth 2 (two) times daily.     No current facility-administered medications on file prior to visit.     Past Medical History:  Diagnosis Date  . Breast cancer ([place]) 2012  . Diabetes mellitus   . Family history of breast cancer   . Family history of ovarian cancer   . GERD (gastroesophageal reflux disease)   . Gilbert syndrome   . Hypertension   . IBS (irritable bowel syndrome)   . Plantar fasciitis of right foot    Dr Oneta Rack  . Transfusion history 1976    Past Surgical History:  Procedure Laterality Date  . ABDOMINAL HYSTERECTOMY  1976   with BSO due to infection from [facility] IUD  . APPENDECTOMY  1976   @ [place]     [id number], [phone]  . CATARACT EXTRACTION, BILATERAL Bilateral 2018  . COLONOSCOPY W/ POLYPECTOMY  2006   negative 2011; Dr Carlean Purl  .  MASTECTOMY W/ NODES PARTIAL  2012   double mastectomy with nodes taken out on right side   . PLACEMENT OF BREAST IMPLANTS  04/2011   Dr Migdalia Dk, [facility]  . TONSILLECTOMY AND ADENOIDECTOMY      Social History   Socioeconomic History  . Marital status: Married    Spouse name: Not on file  . Number of children: Not on file  . Years of education: Not on file  . Highest education level: Not on file  Occupational History  . Not on file  Social Needs  . Financial resource strain: Not hard at all  . Food insecurity:    Worry: Never true    Inability: Never true  . Transportation needs:    Medical: No    Non-medical: No  Tobacco Use  . Smoking status: Former Smoker    Last attempt to quit: 09/18/1977     Years since quitting: 40.9  . Smokeless tobacco: Never Used  . Tobacco comment: smoked 1957-1979 , up to 1 ppd  Substance and Sexual Activity  . Alcohol use: No    Comment:  very rarely  . Drug use: No  . Sexual activity: Not Currently    Birth control/protection: Surgical  Lifestyle  . Physical activity:    Days per week: 0 days    Minutes per session: 0 min  . Stress: Not at all  Relationships  . Social connections:    Talks on phone: More than three times a week    Gets together: More than three times a week    Attends religious service: More than 4 times per year    Active member of club or organization: Yes    Attends meetings of clubs or organizations: More than 4 times per year    Relationship status: Married  Other Topics Concern  . Not on file  Social History Narrative   Walking for exercise    Family History  Problem Relation Age of Onset  . Heart attack Father 25  . Breast cancer Sister 80        bilateral   . Heart failure Sister        PMH intensive chemotherapy  . Hypertension Sister   . Stroke Sister 61  . Stroke Mother        TMI  . Breast cancer Maternal Aunt 71  . Diabetes Maternal Grandmother   . Ovarian cancer Paternal Grandmother   . Breast cancer Maternal Aunt 77    Review of Systems  Constitutional: Negative for chills and fever.  Eyes: Negative for visual disturbance.  Respiratory: Negative for cough, shortness of breath and wheezing.   Cardiovascular: Negative for chest pain and palpitations.  Gastrointestinal: Negative for abdominal pain, blood in stool, constipation, diarrhea and nausea.  Genitourinary: Negative for dysuria and hematuria.  Musculoskeletal: Negative for arthralgias.  Skin: Negative for color change and rash.  Neurological: Positive for headaches (transient, mild). Negative for light-headedness.  Psychiatric/Behavioral: Negative for dysphoric mood. The patient is not nervous/anxious.        Objective:   Vitals:    08/23/18 0753  BP: 122/62  Pulse: 91  Resp: 14  Temp: 98.1 F (36.7 C)  SpO2: 97%   Filed Weights   08/23/18 0753  Weight: 163 lb 12.8 oz (74.3 kg)   Body mass index is 27.68 kg/m.  BP Readings from Last 3 Encounters:  08/23/18 122/62  07/25/18 118/70  02/20/18 132/76    Wt Readings from Last 3 Encounters:  08/23/18 163 lb 12.8 oz (74.3 kg)  07/25/18 165 lb (74.8 kg)  02/20/18 166 lb (75.3 kg)     Physical Exam Constitutional: She appears well-developed and well-nourished. No distress.  HENT:  Head: Normocephalic and atraumatic.  Right Ear: External ear normal. Normal ear canal w/ excessive cerumen, TM not visualized Left Ear: External ear normal.  Normal ear canal w/ excessive cerumen, TM not visualized Mouth/Throat: Oropharynx is clear and moist.  Eyes: Conjunctivae and EOM are normal.  Neck: Neck supple. No tracheal deviation present. No thyromegaly present.  No carotid bruit  Cardiovascular: Normal rate, regular rhythm and normal heart sounds.   No murmur heard.  No edema. Pulmonary/Chest: Effort normal and breath sounds normal. No respiratory distress. She has no wheezes. She has no rales.  Breast: b/l implants, chest wall/axillas b/l normal w/o lumps, no skin changes Abdominal: Soft. She exhibits no distension. There is no tenderness.  Lymphadenopathy: She has no cervical adenopathy.  Skin: Skin is warm and dry. She is not diaphoretic.  Psychiatric: She has a normal mood and affect. Her behavior is normal.        Assessment & Plan:   Physical exam: Screening blood work   ordered Immunizations  Discussed shingrix, others up to date Colonoscopy  Last done 2011 - up to date - no further scopes needed Mammogram   N/A Gyn  No longer seeing Dexa     Up to date  Eye exams    Up to date  EKG  Last done 2013 Exercise  Doing PT exercises, Walking Weight  Encouraged weight loss Skin sees derm annually - no concerns today Substance abuse  none  See Problem  List for Assessment and Plan of chronic medical problems.    FU in 6 months

## 2018-08-23 ENCOUNTER — Ambulatory Visit (INDEPENDENT_AMBULATORY_CARE_PROVIDER_SITE_OTHER): Payer: Medicare Other | Admitting: Internal Medicine

## 2018-08-23 ENCOUNTER — Encounter: Payer: Self-pay | Admitting: Internal Medicine

## 2018-08-23 ENCOUNTER — Other Ambulatory Visit (INDEPENDENT_AMBULATORY_CARE_PROVIDER_SITE_OTHER): Payer: Medicare Other

## 2018-08-23 VITALS — BP 122/62 | HR 91 | Temp 98.1°F | Resp 14 | Ht 64.5 in | Wt 163.8 lb

## 2018-08-23 DIAGNOSIS — E119 Type 2 diabetes mellitus without complications: Secondary | ICD-10-CM

## 2018-08-23 DIAGNOSIS — E781 Pure hyperglyceridemia: Secondary | ICD-10-CM | POA: Diagnosis not present

## 2018-08-23 DIAGNOSIS — G629 Polyneuropathy, unspecified: Secondary | ICD-10-CM

## 2018-08-23 DIAGNOSIS — K219 Gastro-esophageal reflux disease without esophagitis: Secondary | ICD-10-CM | POA: Diagnosis not present

## 2018-08-23 DIAGNOSIS — Z0000 Encounter for general adult medical examination without abnormal findings: Secondary | ICD-10-CM

## 2018-08-23 DIAGNOSIS — G6289 Other specified polyneuropathies: Secondary | ICD-10-CM

## 2018-08-23 DIAGNOSIS — I10 Essential (primary) hypertension: Secondary | ICD-10-CM

## 2018-08-23 HISTORY — DX: Polyneuropathy, unspecified: G62.9

## 2018-08-23 LAB — LIPID PANEL
Cholesterol: 146 mg/dL (ref 0–200)
HDL: 36.9 mg/dL — ABNORMAL LOW (ref >39.00)
LDL Cholesterol: 73 mg/dL (ref 0–99)
NonHDL: 109.17
Total CHOL/HDL Ratio: 4
Triglycerides: 180 mg/dL — ABNORMAL HIGH (ref 0.0–149.0)
VLDL: 36 mg/dL (ref 0.0–40.0)

## 2018-08-23 LAB — COMPREHENSIVE METABOLIC PANEL
ALT: 48 U/L — ABNORMAL HIGH (ref 0–35)
AST: 51 U/L — ABNORMAL HIGH (ref 0–37)
Albumin: 4.1 g/dL (ref 3.5–5.2)
Alkaline Phosphatase: 56 U/L (ref 39–117)
BILIRUBIN TOTAL: 1.4 mg/dL — AB (ref 0.2–1.2)
BUN: 17 mg/dL (ref 6–23)
CO2: 26 mEq/L (ref 19–32)
Calcium: 9.7 mg/dL (ref 8.4–10.5)
Chloride: 104 mEq/L (ref 96–112)
Creatinine, Ser: 0.94 mg/dL (ref 0.40–1.20)
GFR: 61.39 mL/min (ref >60.00)
Glucose, Bld: 185 mg/dL — ABNORMAL HIGH (ref 70–99)
Potassium: 3.5 mEq/L (ref 3.5–5.1)
Sodium: 142 mEq/L (ref 135–145)
Total Protein: 7.2 g/dL (ref 6.0–8.3)

## 2018-08-23 LAB — HEMOGLOBIN A1C: Hgb A1c MFr Bld: 6.8 % — ABNORMAL HIGH (ref 4.6–6.5)

## 2018-08-23 LAB — CBC WITH DIFFERENTIAL/PLATELET
BASOS ABS: 0 10*3/uL (ref 0.0–0.1)
Basophils Relative: 0.5 % (ref 0.0–3.0)
Eosinophils Absolute: 0.1 10*3/uL (ref 0.0–0.7)
Eosinophils Relative: 2.4 % (ref 0.0–5.0)
HCT: 37.9 % (ref 36.0–46.0)
Hemoglobin: 13.1 g/dL (ref 12.0–15.0)
Lymphocytes Relative: 28.8 % (ref 12.0–46.0)
Lymphs Abs: 1.8 10*3/uL (ref 0.7–4.0)
MCHC: 34.6 g/dL (ref 30.0–36.0)
MCV: 86.6 fl (ref 78.0–100.0)
MONOS PCT: 10.5 % (ref 3.0–12.0)
Monocytes Absolute: 0.6 10*3/uL (ref 0.1–1.0)
Neutro Abs: 3.5 10*3/uL (ref 1.4–7.7)
Neutrophils Relative %: 57.8 % (ref 43.0–77.0)
Platelets: 217 10*3/uL (ref 150.0–400.0)
RBC: 4.38 Mil/uL (ref 3.87–5.11)
RDW: 13.8 % (ref 11.5–15.5)
WBC: 6.1 10*3/uL (ref 4.0–10.5)

## 2018-08-23 LAB — TSH: TSH: 3.63 u[IU]/mL (ref 0.35–4.50)

## 2018-08-23 NOTE — Assessment & Plan Note (Signed)
Compliant with diabetic diet Check a1c Low sugar / carb diet Stressed regular exercise, weight loss

## 2018-08-23 NOTE — Assessment & Plan Note (Signed)
Pain in feet with walking

## 2018-08-23 NOTE — Assessment & Plan Note (Signed)
BP well controlled Current regimen effective and well tolerated Continue current medications at current doses cmp  

## 2018-08-23 NOTE — Assessment & Plan Note (Signed)
Check lipid panel  Regular exercise and healthy diet encouraged  

## 2018-08-23 NOTE — Assessment & Plan Note (Signed)
Takes medication otc as needed controlled

## 2018-08-24 ENCOUNTER — Encounter: Payer: Self-pay | Admitting: Internal Medicine

## 2018-08-29 ENCOUNTER — Other Ambulatory Visit: Payer: Self-pay | Admitting: Neurology

## 2018-09-19 ENCOUNTER — Other Ambulatory Visit: Payer: Self-pay | Admitting: Internal Medicine

## 2018-09-27 ENCOUNTER — Ambulatory Visit: Payer: Self-pay | Admitting: Neurology

## 2018-10-10 NOTE — Progress Notes (Signed)
NEUROLOGY FOLLOW UP OFFICE NOTE  [name] [id number]  HISTORY OF PRESENT ILLNESS: [name] is a 78 year old female with diabetes, hypertension, Gilbert's syndrome, and irritable bowel syndrome who follows up for abnormal gait.  She is accompanied by her husband who supplements history.  UPDATE:  She was started on a carbidopa-levodopa trial.  She note improvement.  She just feels better.  Her husband reports she is moving better and more steady.  She has not had any falls since last visit.  She states that the writing has not improved significantly but notes some improvement.    HISTORY: Since summer 2018, she has had trouble with her penmanship. She states it is more difficult to write and that her handwriting is smaller. She denies cramping.  In early 2019, she started having increased problems with balance. She needs to push off in order to stand. She feels weak in the legs. She denies back pain or radicular pain and numbness in the feet. She denies neck pain. She denies freezing when initiating taking a step. She denies dizziness or lightheadedness.  She looked up her symptoms on line and is concerned about Parkinson's disease. She denies tremor, difficulty eating (using utensils, swallowing), drooling. Sense of smell is intact. She does not experience symptoms consistent with REM sleep behavior disorder. She reports no cognitive difficulty. There is no family history of PD.  PAST MEDICAL HISTORY: Past Medical History:  Diagnosis Date  . Breast cancer ([place]) 2012  . Diabetes mellitus   . Family history of breast cancer   . Family history of ovarian cancer   . GERD (gastroesophageal reflux disease)   . Gilbert syndrome   . Hypertension   . IBS (irritable bowel syndrome)   . Plantar fasciitis of right foot    Dr Oneta Rack  . Transfusion history 1976    MEDICATIONS: Current Outpatient Medications on File Prior to Visit  Medication Sig Dispense  Refill  . Ascorbic Acid (VITAMIN C) 1000 MG tablet Take 1,000 mg by mouth daily.    . carbidopa-levodopa (SINEMET IR) 25-100 MG tablet 1 TAB 3 TIMES DAILY. 90 tablet 3  . cloNIDine (CATAPRES) 0.2 MG tablet TAKE 1 TABLET BY MOUTH TWICE DAILY. 180 tablet 1  . Cyanocobalamin (VITAMIN B-12) 5000 MCG SUBL Place under the tongue.    Marland Kitchen losartan-hydrochlorothiazide (HYZAAR) 100-12.5 MG tablet TAKE 1 TABLET ONCE DAILY. 90 tablet 1  . metFORMIN (GLUCOPHAGE) 500 MG tablet TAKE 2 TABLETS TWICE A DAY WITH MEALS. 360 tablet 1  . Multiple Vitamin (MULTIVITAMIN) capsule Take 1 capsule by mouth daily.      . Omega-3 Fatty Acids (FISH OIL CONCENTRATE) 1000 MG CAPS Take by mouth 2 (two) times daily.     Marland Kitchen oxybutynin (OXYTROL) 3.9 MG/24HR Place 1 patch onto the skin 2 (two) times a week.      . Probiotic Product (ALIGN) 4 MG CAPS Take by mouth as needed.     . ranitidine (ZANTAC) 150 MG tablet Take 150 mg by mouth 2 (two) times daily.     No current facility-administered medications on file prior to visit.     ALLERGIES: Allergies  Allergen Reactions  . Sulfur     Flushed, funny feeling in throat   . Exemestane Nausea Only    Other reaction(s): Dizziness (intolerance)  . Morphine And Related   . Statins Other (See Comments)    Elevated LFTs  . Ace Inhibitors     REACTION: COUGH  . Codeine  REACTION: VOMITTING    FAMILY HISTORY: Family History  Problem Relation Age of Onset  . Heart attack Father 49  . Breast cancer Sister 41        bilateral   . Heart failure Sister        PMH intensive chemotherapy  . Hypertension Sister   . Stroke Sister 72  . Stroke Mother        TMI  . Breast cancer Maternal Aunt 66  . Diabetes Maternal Grandmother   . Ovarian cancer Paternal Grandmother   . Breast cancer Maternal Aunt 77   SOCIAL HISTORY: Social History   Socioeconomic History  . Marital status: Married    Spouse name: Not on file  . Number of children: Not on file  . Years of education:  Not on file  . Highest education level: Not on file  Occupational History  . Not on file  Social Needs  . Financial resource strain: Not hard at all  . Food insecurity:    Worry: Never true    Inability: Never true  . Transportation needs:    Medical: No    Non-medical: No  Tobacco Use  . Smoking status: Former Smoker    Last attempt to quit: 09/18/1977    Years since quitting: 41.0  . Smokeless tobacco: Never Used  . Tobacco comment: smoked 1957-1979 , up to 1 ppd  Substance and Sexual Activity  . Alcohol use: No    Comment:  very rarely  . Drug use: No  . Sexual activity: Not Currently    Birth control/protection: Surgical  Lifestyle  . Physical activity:    Days per week: 0 days    Minutes per session: 0 min  . Stress: Not at all  Relationships  . Social connections:    Talks on phone: More than three times a week    Gets together: More than three times a week    Attends religious service: More than 4 times per year    Active member of club or organization: Yes    Attends meetings of clubs or organizations: More than 4 times per year    Relationship status: Married  . Intimate partner violence:    Fear of current or ex partner: No    Emotionally abused: No    Physically abused: No    Forced sexual activity: No  Other Topics Concern  . Not on file  Social History Narrative   Walking for exercise    REVIEW OF SYSTEMS: Constitutional: No fevers, chills, or sweats, no generalized fatigue, change in appetite Eyes: No visual changes, double vision, eye pain Ear, nose and throat: No hearing loss, ear pain, nasal congestion, sore throat Cardiovascular: No chest pain, palpitations Respiratory:  No shortness of breath at rest or with exertion, wheezes GastrointestinaI: No nausea, vomiting, diarrhea, abdominal pain, fecal incontinence Genitourinary:  No dysuria, urinary retention or frequency Musculoskeletal:  No neck pain, back pain Integumentary: No rash, pruritus,  skin lesions Neurological: as above Psychiatric: No depression, insomnia, anxiety Endocrine: No palpitations, fatigue, diaphoresis, mood swings, change in appetite, change in weight, increased thirst Hematologic/Lymphatic:  No purpura, petechiae. Allergic/Immunologic: no itchy/runny eyes, nasal congestion, recent allergic reactions, rashes  PHYSICAL EXAM: Blood pressure 120/78, pulse 86, height 5' 4.5" (1.638 m), weight 167 lb 4 oz (75.9 kg), SpO2 99 %. General: No acute distress.  Patient appears well-groomed.  Head:  Normocephalic/atraumatic Eyes:  Fundi examined but not visualized Neck: supple, no paraspinal tenderness, full range of  motion Heart:  Regular rate and rhythm Lungs:  Clear to auscultation bilaterally Back: No paraspinal tenderness Neurological Exam: alert and oriented to person, place, and time. Attention span and concentration intact, recent and remote memory intact, fund of knowledge intact.  Speech fluent and not dysarthric, language intact.  CN II-XII intact. Bulk and tone normal, no rigidity.  Muscle strength 5 /5 throughout.  Micrographia.  Finger-thumb tapping reduced on the right.  No tremor.  Sensation to light touch intact.  Deep tendon reflexes 2+ throughout.  Questionable right Babinski reflex versus withdrawal.  Finger to nose testing intact.  Gait wide-based and slightly waddling with upright posture and mildly reduced arm swing bilaterally.  Romberg negative.  IMPRESSION: I am not convinced that Mrs. Viernes has Parkinson's disease.  She reports improvement with Sinemet in regards to just "feeling better" and mild improvement in gait which does not appear parkinsonian.  However, her micrographia has not improved.  She may be experiencing a placebo effect to the Sinemet.  She does have bradykinesia of right hand.  On exam, she has questionable right Babinski verus withdrawal but no other upper motor neuron signs.  I would like to rule out alternative causes for her  right upper extremity bradykinesia and balance problems.  PLAN: 1.  MRI of brain.  If unremarkable, will check MRI of cervical spine to further evaluate right upper extremity bradykinesia, balance difficulty and questionable Babinski. 2.  She wishes to remain on the Sinemet and we will continue for now since she is not experiencing side effects. 3.  Follow up and further recommendations pending results.  20 minutes spent face to face with patient, over 50% spent discussing diagnosis and plan.  Metta Clines, DO  CC:  Billey Gosling, MD

## 2018-10-11 ENCOUNTER — Ambulatory Visit: Payer: Medicare Other | Admitting: Neurology

## 2018-10-11 ENCOUNTER — Encounter: Payer: Self-pay | Admitting: Neurology

## 2018-10-11 VITALS — BP 120/78 | HR 86 | Ht 64.5 in | Wt 167.2 lb

## 2018-10-11 DIAGNOSIS — R2681 Unsteadiness on feet: Secondary | ICD-10-CM

## 2018-10-11 DIAGNOSIS — R292 Abnormal reflex: Secondary | ICD-10-CM | POA: Diagnosis not present

## 2018-10-11 DIAGNOSIS — R29898 Other symptoms and signs involving the musculoskeletal system: Secondary | ICD-10-CM

## 2018-10-11 NOTE — Patient Instructions (Addendum)
I am not quite convinced that you have Parkinson's disease.  I would like to look for other causes of the right hand slowing and balance problems.  1.  We will check MRI of brain. We have sent a referral to [facility] for your MRI and they will call you directly to schedule your appt. They are located at [place]. If you need to contact them directly please call [phone]. 2.  If MRI of brain is unremarkable, then I will check MRI of cervical spine 3.  Further recommendations pending these results. 4.  In meantime, continue carbidopa-levodopa 5.  Follow up after testing.

## 2018-10-19 ENCOUNTER — Ambulatory Visit
Admission: RE | Admit: 2018-10-19 | Discharge: 2018-10-19 | Disposition: A | Discharge status: Home / Self Care | Payer: Medicare Other | Source: Ambulatory Visit | Attending: Neurology | Admitting: Neurology

## 2018-10-19 DIAGNOSIS — R29898 Other symptoms and signs involving the musculoskeletal system: Secondary | ICD-10-CM

## 2018-10-19 DIAGNOSIS — R292 Abnormal reflex: Secondary | ICD-10-CM

## 2018-10-19 DIAGNOSIS — I10 Essential (primary) hypertension: Secondary | ICD-10-CM | POA: Diagnosis not present

## 2018-10-19 DIAGNOSIS — R2681 Unsteadiness on feet: Secondary | ICD-10-CM

## 2018-10-24 ENCOUNTER — Telehealth: Payer: Self-pay

## 2018-10-24 DIAGNOSIS — R292 Abnormal reflex: Secondary | ICD-10-CM

## 2018-10-24 DIAGNOSIS — R29898 Other symptoms and signs involving the musculoskeletal system: Secondary | ICD-10-CM

## 2018-10-24 DIAGNOSIS — R2681 Unsteadiness on feet: Secondary | ICD-10-CM

## 2018-10-24 NOTE — Telephone Encounter (Signed)
-----   Message from Pieter Partridge, DO sent at 10/21/2018  7:15 AM EST ----- MRI of brain unremarkable.  Would like to get MRI of cervical spine to evaluate for right hand weakness, unsteady gait and Babinski

## 2018-10-24 NOTE — Telephone Encounter (Signed)
Called and advised Pt of brain MRI results as well as  c spine MRI recommendation. She is aware [facility] will call to schedule that with her.

## 2018-11-04 ENCOUNTER — Ambulatory Visit
Admission: RE | Admit: 2018-11-04 | Discharge: 2018-11-04 | Disposition: A | Discharge status: Home / Self Care | Payer: Medicare Other | Source: Ambulatory Visit | Attending: Neurology | Admitting: Neurology

## 2018-11-04 DIAGNOSIS — R29898 Other symptoms and signs involving the musculoskeletal system: Secondary | ICD-10-CM

## 2018-11-04 DIAGNOSIS — R292 Abnormal reflex: Secondary | ICD-10-CM

## 2018-11-04 DIAGNOSIS — M4802 Spinal stenosis, cervical region: Secondary | ICD-10-CM | POA: Diagnosis not present

## 2018-11-04 DIAGNOSIS — R2681 Unsteadiness on feet: Secondary | ICD-10-CM

## 2018-11-08 ENCOUNTER — Telehealth: Payer: Self-pay

## 2018-11-08 NOTE — Telephone Encounter (Signed)
Called and advised Pt of results. 

## 2018-11-08 NOTE — Telephone Encounter (Signed)
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

## 2018-12-03 DIAGNOSIS — M7541 Impingement syndrome of right shoulder: Secondary | ICD-10-CM | POA: Diagnosis not present

## 2018-12-03 DIAGNOSIS — M25511 Pain in right shoulder: Secondary | ICD-10-CM | POA: Diagnosis not present

## 2018-12-10 DIAGNOSIS — M25511 Pain in right shoulder: Secondary | ICD-10-CM | POA: Diagnosis not present

## 2018-12-13 DIAGNOSIS — M75111 Incomplete rotator cuff tear or rupture of right shoulder, not specified as traumatic: Secondary | ICD-10-CM | POA: Diagnosis not present

## 2018-12-13 DIAGNOSIS — M25511 Pain in right shoulder: Secondary | ICD-10-CM | POA: Diagnosis not present

## 2018-12-13 DIAGNOSIS — M19011 Primary osteoarthritis, right shoulder: Secondary | ICD-10-CM | POA: Diagnosis not present

## 2018-12-19 DIAGNOSIS — M75111 Incomplete rotator cuff tear or rupture of right shoulder, not specified as traumatic: Secondary | ICD-10-CM | POA: Diagnosis not present

## 2018-12-19 DIAGNOSIS — M19019 Primary osteoarthritis, unspecified shoulder: Secondary | ICD-10-CM | POA: Diagnosis not present

## 2018-12-19 DIAGNOSIS — M25511 Pain in right shoulder: Secondary | ICD-10-CM | POA: Diagnosis not present

## 2019-01-15 ENCOUNTER — Other Ambulatory Visit: Payer: Self-pay | Admitting: Neurology

## 2019-02-13 DIAGNOSIS — H26493 Other secondary cataract, bilateral: Secondary | ICD-10-CM | POA: Diagnosis not present

## 2019-02-13 DIAGNOSIS — H5213 Myopia, bilateral: Secondary | ICD-10-CM | POA: Diagnosis not present

## 2019-02-13 DIAGNOSIS — E119 Type 2 diabetes mellitus without complications: Secondary | ICD-10-CM | POA: Diagnosis not present

## 2019-02-13 DIAGNOSIS — H04123 Dry eye syndrome of bilateral lacrimal glands: Secondary | ICD-10-CM | POA: Diagnosis not present

## 2019-02-13 LAB — HM DIABETES EYE EXAM

## 2019-02-22 NOTE — Progress Notes (Signed)
Subjective:    Patient ID: [name], female    DOB: Feb 02, 1941, 78 y.o.   MRN: [id number]  HPI The patient is here for follow up.  She is exercising regularly - walks with rollator around the neighborhood or up and down the fall.      She has no energy.  She gets good sleep.  When she gets up in the morning she feels okay and then after breakfast she has no energy and does not want to do anything.  She thinks this started within the last 6 months.  Dizziness: She denies a true spinning sensation.  She feels unbalanced and will walk into walls at times.  She feels it when she goes to bend over.  She has to hang onto something when she bends over because she will fall over.  It started several months ago.  She did have an MRI of her brain in February.  Diabetes: She is taking her medication daily as prescribed. She is compliant with a diabetic diet.   She denies numbness/tingling in her feet and foot lesions. She is up-to-date with an ophthalmology examination.   Hypertension: She is taking her medication daily. She is compliant with a low sodium diet.  She denies chest pain, palpitations, edema, shortness of breath and regular headaches.   GERD: She takes Zantac only as needed and not on a regular basis.  She does have one that does not contain any contaminants.  Hypertriglyceridemia: She is not on any medication daily. She is compliant with a low fat/cholesterol diet.   CKD:  She takes her medications daily.  She drinks plenty of water.  She does not take nsaids.       Medications and allergies reviewed with patient and updated if appropriate.  Patient Active Problem List   Diagnosis Date Noted  . Fatigue 02/24/2019  . Dizziness 02/24/2019  . Peripheral neuropathy 08/23/2018  . Poor balance 02/20/2018  . Genetic testing 09/04/2017  . Family history of breast cancer   . Family history of ovarian cancer   . Fatty liver 08/05/2015  . History of breast cancer  08/04/2015  . S/P bilateral mastectomy 08/04/2015  . Osteoarthritis of left knee 08/04/2015  . Elevated LFTs 08/10/2014  . Osteopenia 08/31/2013  . Transfusion history 08/18/2013  . IBS (irritable bowel syndrome) 08/18/2013  . 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 Joint pain 09/02/2010  . History of colonic polyps 06/06/2010  . Essential hypertension 02/16/2009  . Diabetes type 2, controlled ([place]) 08/11/2008  . Esophageal reflux 05/06/2008  . Hypertriglyceridemia 09/30/2007  . GILBERT'S SYNDROME 02/06/2007    Current Outpatient Medications on File Prior to Visit  Medication Sig Dispense Refill  . Ascorbic Acid (VITAMIN C) 1000 MG tablet Take 1,000 mg by mouth daily.    . carbidopa-levodopa (SINEMET IR) 25-100 MG tablet TAKE 1 TABLET BY MOUTH 3 TIMES A DAY. 90 tablet 5  . cloNIDine (CATAPRES) 0.2 MG tablet TAKE 1 TABLET BY MOUTH TWICE DAILY. 180 tablet 1  . Cyanocobalamin (VITAMIN B-12) 5000 MCG SUBL Place under the tongue.    . Multiple Vitamin (MULTIVITAMIN) capsule Take 1 capsule by mouth daily.      . Omega-3 Fatty Acids (FISH OIL CONCENTRATE) 1000 MG CAPS Take by mouth 2 (two) times daily.     Marland Kitchen oxybutynin (OXYTROL) 3.9 MG/24HR Place 1 patch onto the skin 2 (two) times a week.      . Probiotic Product (  ALIGN) 4 MG CAPS Take by mouth as needed.     . ranitidine (ZANTAC) 150 MG tablet Take 150 mg by mouth 2 (two) times daily.     No current facility-administered medications on file prior to visit.     Past Medical History:  Diagnosis Date  . Breast cancer ([place]) 2012  . Diabetes mellitus   . Family history of breast cancer   . Family history of ovarian cancer   . GERD (gastroesophageal reflux disease)   . Gilbert syndrome   . Hypertension   . IBS (irritable bowel syndrome)   . Plantar fasciitis of right foot    Dr Oneta Rack  . Transfusion history 1976    Past Surgical History:  Procedure Laterality Date  . ABDOMINAL HYSTERECTOMY  1976    with BSO due to infection from [facility] IUD  . APPENDECTOMY  1976   @ [place]     [id number], [phone]  . CATARACT EXTRACTION, BILATERAL Bilateral 2018  . COLONOSCOPY W/ POLYPECTOMY  2006   negative 2011; Dr Carlean Purl  . MASTECTOMY W/ NODES PARTIAL  2012   double mastectomy with nodes taken out on right side   . PLACEMENT OF BREAST IMPLANTS  04/2011   Dr Migdalia Dk, [facility]  . TONSILLECTOMY AND ADENOIDECTOMY      Social History   Socioeconomic History  . Marital status: Married    Spouse name: Not on file  . Number of children: Not on file  . Years of education: Not on file  . Highest education level: Not on file  Occupational History  . Not on file  Social Needs  . Financial resource strain: Not hard at all  . Food insecurity:    Worry: Never true    Inability: Never true  . Transportation needs:    Medical: No    Non-medical: No  Tobacco Use  . Smoking status: Former Smoker    Last attempt to quit: 09/18/1977    Years since quitting: 41.4  . Smokeless tobacco: Never Used  . Tobacco comment: smoked 1957-1979 , up to 1 ppd  Substance and Sexual Activity  . Alcohol use: No    Comment:  very rarely  . Drug use: No  . Sexual activity: Not Currently    Birth control/protection: Surgical  Lifestyle  . Physical activity:    Days per week: 0 days    Minutes per session: 0 min  . Stress: Not at all  Relationships  . Social connections:    Talks on phone: More than three times a week    Gets together: More than three times a week    Attends religious service: More than 4 times per year    Active member of club or organization: Yes    Attends meetings of clubs or organizations: More than 4 times per year    Relationship status: Married  Other Topics Concern  . Not on file  Social History Narrative   Walking for exercise    Family History  Problem Relation Age of Onset  . Heart attack Father 86  . Breast cancer Sister 32         bilateral   . Heart failure Sister        PMH intensive chemotherapy  . Hypertension Sister   . Stroke Sister 35  . Stroke Mother        TMI  . Breast cancer Maternal Aunt 20  . Diabetes Maternal Grandmother   .  Ovarian cancer Paternal Grandmother   . Breast cancer Maternal Aunt 77    Review of Systems  Constitutional: Positive for fatigue. Negative for chills and fever.  Respiratory: Negative for cough, shortness of breath and wheezing.   Cardiovascular: Negative for chest pain, palpitations and leg swelling.  Neurological: Negative for light-headedness and headaches.  Psychiatric/Behavioral: Negative for dysphoric mood.       Objective:   Vitals:   02/24/19 1053  BP: 118/60  Pulse: 81  Resp: 16  Temp: 98 F (36.7 C)  SpO2: 97%   BP Readings from Last 3 Encounters:  02/24/19 118/60  10/11/18 120/78  08/23/18 122/62   Wt Readings from Last 3 Encounters:  02/24/19 167 lb (75.8 kg)  10/11/18 167 lb 4 oz (75.9 kg)  08/23/18 163 lb 12.8 oz (74.3 kg)   Body mass index is 28.22 kg/m.   Physical Exam    Constitutional: Appears well-developed and well-nourished. No distress.  HENT:  Head: Normocephalic and atraumatic.  Neck: Neck supple. No tracheal deviation present. No thyromegaly present.  No cervical lymphadenopathy Cardiovascular: Normal rate, regular rhythm and normal heart sounds.   No murmur heard. No carotid bruit .  No edema Pulmonary/Chest: Effort normal and breath sounds normal. No respiratory distress. No has no wheezes. No rales.  Skin: Skin is warm and dry. Not diaphoretic.  Psychiatric: Normal mood and affect. Behavior is normal.    Diabetic Foot Exam - Simple   Simple Foot Form Diabetic Foot exam was performed with the following findings:  Yes 02/24/2019 12:01 PM  Visual Inspection No deformities, no ulcerations, no other skin breakdown bilaterally:  Yes Sensation Testing Intact to touch and monofilament testing bilaterally:  Yes Pulse Check  Posterior Tibialis and Dorsalis pulse intact bilaterally:  Yes Comments       Assessment & Plan:    See Problem List for Assessment and Plan of chronic medical problems.

## 2019-02-22 NOTE — Patient Instructions (Addendum)

## 2019-02-24 ENCOUNTER — Other Ambulatory Visit: Payer: Self-pay

## 2019-02-24 ENCOUNTER — Other Ambulatory Visit (INDEPENDENT_AMBULATORY_CARE_PROVIDER_SITE_OTHER): Payer: Medicare Other

## 2019-02-24 ENCOUNTER — Ambulatory Visit (INDEPENDENT_AMBULATORY_CARE_PROVIDER_SITE_OTHER): Payer: Medicare Other | Admitting: Internal Medicine

## 2019-02-24 ENCOUNTER — Encounter: Payer: Self-pay | Admitting: Internal Medicine

## 2019-02-24 VITALS — BP 118/60 | HR 81 | Temp 98.0°F | Resp 16 | Ht 64.5 in | Wt 167.0 lb

## 2019-02-24 DIAGNOSIS — E119 Type 2 diabetes mellitus without complications: Secondary | ICD-10-CM | POA: Diagnosis not present

## 2019-02-24 DIAGNOSIS — R5383 Other fatigue: Secondary | ICD-10-CM

## 2019-02-24 DIAGNOSIS — I10 Essential (primary) hypertension: Secondary | ICD-10-CM | POA: Diagnosis not present

## 2019-02-24 DIAGNOSIS — G6289 Other specified polyneuropathies: Secondary | ICD-10-CM

## 2019-02-24 DIAGNOSIS — E781 Pure hyperglyceridemia: Secondary | ICD-10-CM

## 2019-02-24 DIAGNOSIS — K219 Gastro-esophageal reflux disease without esophagitis: Secondary | ICD-10-CM

## 2019-02-24 DIAGNOSIS — R42 Dizziness and giddiness: Secondary | ICD-10-CM | POA: Insufficient documentation

## 2019-02-24 HISTORY — DX: Other fatigue: R53.83

## 2019-02-24 HISTORY — DX: Dizziness and giddiness: R42

## 2019-02-24 LAB — COMPREHENSIVE METABOLIC PANEL
ALT: 40 U/L — ABNORMAL HIGH (ref 0–35)
AST: 61 U/L — ABNORMAL HIGH (ref 0–37)
Albumin: 4.1 g/dL (ref 3.5–5.2)
Alkaline Phosphatase: 61 U/L (ref 39–117)
BUN: 13 mg/dL (ref 6–23)
CO2: 25 mEq/L (ref 19–32)
Calcium: 9.6 mg/dL (ref 8.4–10.5)
Chloride: 103 mEq/L (ref 96–112)
Creatinine, Ser: 1.02 mg/dL (ref 0.40–1.20)
GFR: 52.49 mL/min — ABNORMAL LOW (ref >60.00)
Glucose, Bld: 129 mg/dL — ABNORMAL HIGH (ref 70–99)
Potassium: 4.1 mEq/L (ref 3.5–5.1)
Sodium: 140 mEq/L (ref 135–145)
Total Bilirubin: 1.3 mg/dL — ABNORMAL HIGH (ref 0.2–1.2)
Total Protein: 7.1 g/dL (ref 6.0–8.3)

## 2019-02-24 LAB — LIPID PANEL
Cholesterol: 153 mg/dL (ref 0–200)
HDL: 36.9 mg/dL — ABNORMAL LOW (ref >39.00)
LDL Cholesterol: 78 mg/dL (ref 0–99)
NonHDL: 116.35
Total CHOL/HDL Ratio: 4
Triglycerides: 193 mg/dL — ABNORMAL HIGH (ref 0.0–149.0)
VLDL: 38.6 mg/dL (ref 0.0–40.0)

## 2019-02-24 LAB — TSH: TSH: 3.3 u[IU]/mL (ref 0.35–4.50)

## 2019-02-24 LAB — HEMOGLOBIN A1C: Hgb A1c MFr Bld: 7.6 % — ABNORMAL HIGH (ref 4.6–6.5)

## 2019-02-24 LAB — VITAMIN B12: Vitamin B-12: >1500 pg/mL — ABNORMAL HIGH (ref 211–911)

## 2019-02-24 MED ORDER — METFORMIN HCL 500 MG PO TABS: ORAL_TABLET | ORAL | 1 refills | Status: DC

## 2019-02-24 MED ORDER — LOSARTAN POTASSIUM-HCTZ 100-12.5 MG PO TABS: 1.0000 | ORAL_TABLET | Freq: Every day | ORAL | 1 refills | Status: DC

## 2019-02-24 NOTE — Assessment & Plan Note (Signed)
Occasional Takes Zantac only as needed-she has made sure that she has a safe source of the medication.

## 2019-02-24 NOTE — Assessment & Plan Note (Signed)
Has had dizziness for several months-not a true spinning sensation, but more of an unbalanced feeling No obvious causes MRI of brain in February 2020 reviewed She does follow with neurology and will discuss at her next appointment Discussed possible ENT referral, meclizine and physical therapy-she deferred these for now and will try some things at home and let me know if she wants to pursue any of the above

## 2019-02-24 NOTE — Assessment & Plan Note (Signed)
We will check A1c Compliant with a diabetic diet She is exercising Up-to-date with eye exam

## 2019-02-24 NOTE — Assessment & Plan Note (Signed)
BP well controlled Current regimen effective and well tolerated Continue current medications at current doses CMP 

## 2019-02-24 NOTE — Assessment & Plan Note (Signed)
Lipid panel She is exercising Not currently on any medication

## 2019-02-24 NOTE — Assessment & Plan Note (Signed)
For several months she has not had any energy Sleep is good She is exercising She denies any changes in medication that could be causing the fatigue She denies depression Check blood work including CMP, TSH

## 2019-02-24 NOTE — Assessment & Plan Note (Signed)
Will check B12 level

## 2019-02-25 ENCOUNTER — Encounter: Payer: Self-pay | Admitting: Internal Medicine

## 2019-02-26 ENCOUNTER — Encounter: Payer: Self-pay | Admitting: Internal Medicine

## 2019-04-01 ENCOUNTER — Ambulatory Visit (INDEPENDENT_AMBULATORY_CARE_PROVIDER_SITE_OTHER): Payer: Medicare Other | Admitting: *Deleted

## 2019-04-01 DIAGNOSIS — Z0000 Encounter for general adult medical examination without abnormal findings: Secondary | ICD-10-CM

## 2019-04-01 NOTE — Progress Notes (Addendum)
Subjective:   [name] is a 78 y.o. female who presents for Medicare Annual (Subsequent) preventive examination. I connected with patient by a telephone and verified that I am speaking with the correct person using two identifiers. Patient stated full name and DOB. Patient gave permission to continue with telephonic visit. Patient's location was at home and Nurse's location was at [place] office.   Review of Systems:     Sleep patterns: feels rested on waking, gets up 1 times nightly to void and sleeps 8 hours nightly.    Home Safety/Smoke Alarms: Feels safe in home. Smoke alarms in place.  Living environment; residence and Firearm Safety: 1-story house/ trailer. Lives with husband, no needs for DME, good support system Seat Belt Safety/Bike Helmet: Wears seat belt.    Objective:     Vitals: There were no vitals taken for this visit.  There is no height or weight on file to calculate BMI.  Advanced Directives 02/20/2018 01/07/2018 08/14/2016 08/16/2015 08/17/2014  Does Patient Have a Medical Advance Directive? Yes Yes No No Yes  Type of Paramedic of [place];Living will [place];Living will - - -  Copy of Trail in Chart? No - copy requested - - - No - copy requested    Tobacco Social History   Tobacco Use  Smoking Status Former Smoker  . Quit date: 09/18/1977  . Years since quitting: 41.5  Smokeless Tobacco Never Used  Tobacco Comment   smoked 1957-1979 , up to 1 ppd     Counseling given: Not Answered Comment: smoked 1957-1979 , up to 1 ppd  Past Medical History:  Diagnosis Date  . Breast cancer ([place]) 2012  . Diabetes mellitus   . Family history of breast cancer   . Family history of ovarian cancer   . GERD (gastroesophageal reflux disease)   . Gilbert syndrome   . Hypertension   . IBS (irritable bowel syndrome)   . Plantar fasciitis of right foot    Dr Oneta Rack  . Transfusion history 1976   Past Surgical History:  Procedure Laterality Date  . ABDOMINAL HYSTERECTOMY  1976   with BSO due to infection from [facility] IUD  . APPENDECTOMY  1976   @ [place]     [id number], [phone]  . CATARACT EXTRACTION, BILATERAL Bilateral 2018  . COLONOSCOPY W/ POLYPECTOMY  2006   negative 2011; Dr Carlean Purl  . MASTECTOMY W/ NODES PARTIAL  2012   double mastectomy with nodes taken out on right side   . PLACEMENT OF BREAST IMPLANTS  04/2011   Dr Migdalia Dk, [facility]  . TONSILLECTOMY AND ADENOIDECTOMY     Family History  Problem Relation Age of Onset  . Heart attack Father 31  . Breast cancer Sister 2        bilateral   . Heart failure Sister        PMH intensive chemotherapy  . Hypertension Sister   . Stroke Sister 59  . Stroke Mother        TMI  . Breast cancer Maternal Aunt 52  . Diabetes Maternal Grandmother   . Ovarian cancer Paternal Grandmother   . Breast cancer Maternal Aunt 77   Social History   Socioeconomic History  . Marital status: Married    Spouse name: Not on file  . Number of children: 4  . Years of education: Not on file  . Highest education level: Not on file  Occupational History  . Occupation: retired  Scientific laboratory technician  . Financial resource strain: Not hard at all  . Food insecurity    Worry: Never true    Inability: Never true  . Transportation needs    Medical: No    Non-medical: No  Tobacco Use  . Smoking status: Former Smoker    Quit date: 09/18/1977    Years since quitting: 41.5  . Smokeless tobacco: Never Used  . Tobacco comment: smoked 1957-1979 , up to 1 ppd  Substance and Sexual Activity  . Alcohol use: No    Comment:  very rarely  . Drug use: No  . Sexual activity: Not Currently    Birth control/protection: Surgical  Lifestyle  . Physical activity    Days per week: 0 days    Minutes per session: 0 min  . Stress: Not at all  Relationships  . Social connections    Talks on phone: More than three times a  week    Gets together: More than three times a week    Attends religious service: More than 4 times per year    Active member of club or organization: Yes    Attends meetings of clubs or organizations: More than 4 times per year    Relationship status: Married  Other Topics Concern  . Not on file  Social History Narrative   Walking for exercise    Outpatient Encounter Medications as of 04/01/2019  Medication Sig  . Ascorbic Acid (VITAMIN C) 1000 MG tablet Take 1,000 mg by mouth daily.  . carbidopa-levodopa (SINEMET IR) 25-100 MG tablet TAKE 1 TABLET BY MOUTH 3 TIMES A DAY.  . cloNIDine (CATAPRES) 0.2 MG tablet TAKE 1 TABLET BY MOUTH TWICE DAILY. (Patient taking differently: as needed. )  . Cyanocobalamin (VITAMIN B-12) 5000 MCG SUBL Place under the tongue.  Marland Kitchen losartan-hydrochlorothiazide (HYZAAR) 100-12.5 MG tablet Take 1 tablet by mouth daily.  . metFORMIN (GLUCOPHAGE) 500 MG tablet Take 2 tablets by mouth twice daily. (Patient taking differently: 1,500 mg 2 (two) times daily with a meal. Take 3 tablets by mouth with breakfast and 23 tabs with dinner)  . Multiple Vitamin (MULTIVITAMIN) capsule Take 1 capsule by mouth daily.    . Omega-3 Fatty Acids (FISH OIL CONCENTRATE) 1000 MG CAPS Take by mouth 2 (two) times daily.   . Probiotic Product (ALIGN) 4 MG CAPS Take by mouth as needed.   . ranitidine (ZANTAC) 150 MG tablet Take 150 mg by mouth as needed.   Marland Kitchen oxybutynin (OXYTROL) 3.9 MG/24HR Place 1 patch onto the skin as needed.    No facility-administered encounter medications on file as of 04/01/2019.     Activities of Daily Living In your present state of health, do you have any difficulty performing the following activities: 04/01/2019  Hearing? N  Vision? N  Difficulty concentrating or making decisions? N  Walking or climbing stairs? N  Dressing or bathing? N  Doing errands, shopping? N  Preparing Food and eating ? N  Using the Toilet? N  In the past six months, have you  accidently leaked urine? N  Do you have problems with loss of bowel control? N  Managing your Medications? N  Managing your Finances? N  Housekeeping or managing your Housekeeping? N  Some recent data might be hidden    Patient Care Team: Binnie Rail, MD as PCP - General (Internal Medicine) Pieter Partridge, DO as Consulting Physician (Neurology) Shon Hough, MD as Consulting Physician (Ophthalmology)  Assessment:   This is a routine wellness examination for [place]. .  Exercise Activities and Dietary recommendations Current Exercise Habits: Home exercise routine, Type of exercise: calisthenics;walking(does chair exercise), Time (Minutes): 35, Frequency (Times/Week): 4, Weekly Exercise (Minutes/Week): 140, Intensity: Mild, Exercise limited by: orthopedic condition(s)  Diet (meal preparation, eat out, water intake, caffeinated beverages, dairy products, fruits and vegetables): in general, a "healthy" diet  , well balanced. eats a variety of fruits and vegetables daily, limits salt, fat/cholesterol, sugar,carbohydrates,caffeine, drinks 6-8 glasses of water daily.  Goals    . Patient Stated     I want to start to walk by getting up in the morning and going 3 times weekly.       Fall Risk Fall Risk  04/01/2019 02/24/2019 07/25/2018 02/20/2018 12/19/2017  Falls in the past year? 0 1 1 No Yes  Number falls in past yr: 0 0 1 - 1  Injury with Fall? - 1 1 - No  Risk for fall due to : - - History of fall(s) Impaired mobility;Impaired balance/gait History of fall(s);Impaired balance/gait  Follow up - - Falls evaluation completed - Falls evaluation completed    Depression Screen PHQ 2/9 Scores 04/01/2019 02/24/2019 02/20/2018 12/19/2017  PHQ - 2 Score 0 0 0 0     Cognitive Function MMSE - Mini Mental State Exam 02/20/2018  Orientation to time 5  Orientation to Place 5  Registration 3  Attention/ Calculation 5  Recall 3  Language- name 2 objects 2  Language- repeat 1  Language- follow 3  step command 3  Language- read & follow direction 1  Write a sentence 1  Copy design 1  Total score 30       Ad8 score reviewed for issues:  Issues making decisions: no  Less interest in hobbies / activities: no  Repeats questions, stories (family complaining): no  Trouble using ordinary gadgets (microwave, computer, phone):no  Forgets the month or year: no  Mismanaging finances: no  Remembering appts: no  Daily problems with thinking and/or memory: no Ad8 score is= 0  Immunization History  Administered Date(s) Administered  . H1N1 09/15/2008  . Influenza Split 07/24/2011  . Influenza Whole 07/29/2008, 08/06/2009, 08/19/2010  . Influenza, High Dose Seasonal PF 08/18/2013, 06/18/2015, 07/24/2016, 07/10/2017, 07/24/2018  . Influenza, Seasonal, Injecte, Preservative Fre 08/14/2012  . Influenza,inj,Quad PF,6+ Mos 05/28/2014  . Pneumococcal Conjugate-13 09/07/2014  . Pneumococcal Polysaccharide-23 10/05/2011  . Tetanus 02/26/2014  . Zoster 08/19/2011   Screening Tests Health Maintenance  Topic Date Due  . INFLUENZA VACCINE  04/19/2019  . HEMOGLOBIN A1C  08/26/2019  . OPHTHALMOLOGY EXAM  02/13/2020  . FOOT EXAM  02/24/2020  . DEXA SCAN  03/20/2020  . TETANUS/TDAP  02/27/2024  . PNA vac Low Risk Adult  Completed      Plan:    Reviewed health maintenance screenings with patient today and relevant education, vaccines, and/or referrals were provided.   Continue to eat heart healthy diet (full of fruits, vegetables, whole grains, lean protein, water--limit salt, fat, and sugar intake) and increase physical activity as tolerated.  Continue doing brain stimulating activities (puzzles, reading, adult coloring books, staying active) to keep memory sharp.   I have personally reviewed and noted the following in the patient's chart:   . Medical and social history . Use of alcohol, tobacco or illicit drugs  . Current medications and supplements . Functional ability and  status . Nutritional status . Physical activity . Advanced directives . List of other physicians .  Screenings to include cognitive, depression, and falls . Referrals and appointments  In addition, I have reviewed and discussed with patient certain preventive protocols, quality metrics, and best practice recommendations. A written personalized care plan for preventive services as well as general preventive health recommendations were provided to patient.     Michiel Cowboy, RN  04/01/2019    Medical screening examination/treatment/procedure(s) were performed by non-physician practitioner and as supervising physician I was immediately available for consultation/collaboration. I agree with above. Binnie Rail, MD

## 2019-04-29 DIAGNOSIS — M25512 Pain in left shoulder: Secondary | ICD-10-CM | POA: Diagnosis not present

## 2019-04-29 DIAGNOSIS — M7542 Impingement syndrome of left shoulder: Secondary | ICD-10-CM | POA: Diagnosis not present

## 2019-05-01 DIAGNOSIS — L821 Other seborrheic keratosis: Secondary | ICD-10-CM | POA: Diagnosis not present

## 2019-05-01 DIAGNOSIS — D1801 Hemangioma of skin and subcutaneous tissue: Secondary | ICD-10-CM | POA: Diagnosis not present

## 2019-05-01 DIAGNOSIS — D225 Melanocytic nevi of trunk: Secondary | ICD-10-CM | POA: Diagnosis not present

## 2019-05-01 DIAGNOSIS — D2371 Other benign neoplasm of skin of right lower limb, including hip: Secondary | ICD-10-CM | POA: Diagnosis not present

## 2019-05-01 DIAGNOSIS — L570 Actinic keratosis: Secondary | ICD-10-CM | POA: Diagnosis not present

## 2019-06-03 ENCOUNTER — Other Ambulatory Visit: Payer: Self-pay | Admitting: Internal Medicine

## 2019-06-28 ENCOUNTER — Ambulatory Visit (INDEPENDENT_AMBULATORY_CARE_PROVIDER_SITE_OTHER): Payer: Medicare Other

## 2019-06-28 ENCOUNTER — Other Ambulatory Visit: Payer: Self-pay

## 2019-06-28 DIAGNOSIS — Z23 Encounter for immunization: Secondary | ICD-10-CM | POA: Diagnosis not present

## 2019-07-01 ENCOUNTER — Other Ambulatory Visit: Payer: Self-pay

## 2019-07-01 NOTE — Patient Outreach (Signed)
[facility]) Care Management  07/01/2019  [name] December 31, 1940 [id number]   Medication Adherence call to [name] [place] Identifiers Verify spoke with patient she is past due on Metformin 500 mg and Losartan/Hctz 100/12.5 mg patient explain she is taking both medication on a regular basis patient has 15 more days and will order when due patient explain she had extras from before. [name] is showing past due under [place].   [place] Management Direct Dial [phone]  Fax [phone] [name]

## 2019-08-26 NOTE — Patient Instructions (Addendum)
  Tests ordered today. Your results will be released to MyChart (or called to you) after review.  If any changes need to be made, you will be notified at that same time.    Medications reviewed and updated.  Changes include :   none     Please followup in 6 months   

## 2019-08-26 NOTE — Progress Notes (Signed)
Subjective:    Patient ID: [name], female    DOB: Aug 26, 1941, 78 y.o.   MRN: [id number]  HPI The patient is here for follow up.  She is exercising some.    Diabetes: She is taking her medication daily as prescribed. She is compliant with a diabetic diet.  She denies numbness/tingling in her feet and foot lesions. She is up-to-date with an ophthalmology examination.   Hypertension: She is taking her medication daily. She is compliant with a low sodium diet.  She denies  palpitations, edema, shortness of breath.     Hypertriglyceridemia: She is taking her medication daily. She is compliant with a low fat/cholesterol diet. She denies myalgias.   CKD:  She is taking her medications daily as prescribed.  She drinks a good amount of water during the day.  She does not take any nsaids.   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    lightheadedness:  She has dizziness with changes in head position.  She has vestibular exercises at home she can try.    Medications and allergies reviewed with patient and updated if appropriate.  Patient Active Problem List   Diagnosis Date Noted  . Fatigue 02/24/2019  . Dizziness 02/24/2019  . Peripheral neuropathy 08/23/2018  . Poor balance 02/20/2018  . Genetic testing 09/04/2017  . Family history of breast cancer   . Family history of ovarian cancer   . Fatty liver 08/05/2015  . History of breast cancer 08/04/2015  . S/P bilateral mastectomy 08/04/2015  . Osteoarthritis of left knee 08/04/2015  . Elevated LFTs 08/10/2014  . Osteopenia 08/31/2013  . Transfusion history 08/18/2013  . IBS (irritable bowel syndrome) 08/18/2013  . 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 Joint pain 09/02/2010  . History of colonic polyps 06/06/2010  . Essential hypertension 02/16/2009  . Diabetes type 2, controlled ([place]) 08/11/2008  . Esophageal reflux  05/06/2008  . Hypertriglyceridemia 09/30/2007  . GILBERT'S SYNDROME 02/06/2007    Current Outpatient Medications on File Prior to Visit  Medication Sig Dispense Refill  . Ascorbic Acid (VITAMIN C) 1000 MG tablet Take 1,000 mg by mouth daily.    . carbidopa-levodopa (SINEMET IR) 25-100 MG tablet TAKE 1 TABLET BY MOUTH 3 TIMES A DAY. 90 tablet 5  . cloNIDine (CATAPRES) 0.2 MG tablet TAKE 1 TABLET BY MOUTH TWICE DAILY. 180 tablet 0  . Cyanocobalamin (VITAMIN B-12) 5000 MCG SUBL Place under the tongue.    Marland Kitchen losartan-hydrochlorothiazide (HYZAAR) 100-12.5 MG tablet Take 1 tablet by mouth daily. 90 tablet 1  . metFORMIN (GLUCOPHAGE) 500 MG tablet Take 2 tablets by mouth twice daily. (Patient taking differently: 1,500 mg 2 (two) times daily with a meal. Take 3 tablets by mouth with breakfast and 23 tabs with dinner) 360 tablet 1  . Multiple Vitamin (MULTIVITAMIN) capsule Take 1 capsule by mouth daily.      . Omega-3 Fatty Acids (FISH OIL CONCENTRATE) 1000 MG CAPS Take by mouth 2 (two) times daily.     Marland Kitchen oxybutynin (OXYTROL) 3.9 MG/24HR Place 1 patch onto the skin as needed.     . Probiotic Product (ALIGN) 4 MG CAPS Take by mouth as needed.      No current facility-administered medications on file prior to visit.     Past Medical History:  Diagnosis Date  . Breast cancer ([place]) 2012  . Diabetes mellitus   .  Family history of breast cancer   . Family history of ovarian cancer   . GERD (gastroesophageal reflux disease)   . Gilbert syndrome   . Hypertension   . IBS (irritable bowel syndrome)   . Plantar fasciitis of right foot    Dr Oneta Rack  . Transfusion history 1976    Past Surgical History:  Procedure Laterality Date  . ABDOMINAL HYSTERECTOMY  1976   with BSO due to infection from [facility] IUD  . APPENDECTOMY  1976   @ [facility]     [id number]  . CATARACT EXTRACTION, BILATERAL Bilateral 2018  . COLONOSCOPY W/ POLYPECTOMY  2006    negative 2011; Dr Carlean Purl  . MASTECTOMY W/ NODES PARTIAL  2012   double mastectomy with nodes taken out on right side   . PLACEMENT OF BREAST IMPLANTS  04/2011   Dr Migdalia Dk, [facility]  . TONSILLECTOMY AND ADENOIDECTOMY      Social History   Socioeconomic History  . Marital status: Married    Spouse name: Not on file  . Number of children: 4  . Years of education: Not on file  . Highest education level: Not on file  Occupational History  . Occupation: retired  Scientific laboratory technician  . Financial resource strain: Not hard at all  . Food insecurity    Worry: Never true    Inability: Never true  . Transportation needs    Medical: No    Non-medical: No  Tobacco Use  . Smoking status: Former Smoker    Quit date: 09/18/1977    Years since quitting: 41.9  . Smokeless tobacco: Never Used  . Tobacco comment: smoked 1957-1979 , up to 1 ppd  Substance and Sexual Activity  . Alcohol use: No    Comment:  very rarely  . Drug use: No  . Sexual activity: Not Currently    Birth control/protection: Surgical  Lifestyle  . Physical activity    Days per week: 0 days    Minutes per session: 0 min  . Stress: Not at all  Relationships  . Social connections    Talks on phone: More than three times a week    Gets together: More than three times a week    Attends religious service: More than 4 times per year    Active member of club or organization: Yes    Attends meetings of clubs or organizations: More than 4 times per year    Relationship status: Married  Other Topics Concern  . Not on file  Social History Narrative   Walking for exercise    Family History  Problem Relation Age of Onset  . Heart attack Father 39  . Breast cancer Sister 94        bilateral   . Heart failure Sister        PMH intensive chemotherapy  . Hypertension Sister   . Stroke Sister 48  . Stroke Mother        TMI  . Breast cancer Maternal Aunt 43  . Diabetes Maternal Grandmother   . Ovarian cancer Paternal Grandmother    . Breast cancer Maternal Aunt 77    Review of Systems  Constitutional: Negative for chills and fever.  Respiratory: Negative for cough, shortness of breath and wheezing.   Cardiovascular: Positive for chest pain (cramping in chest wall - lasted few min and went away - occurred recently and has not occured again). Negative for palpitations and leg swelling.  Neurological: Positive for headaches (recently). Negative for light-headedness and numbness.       Objective:   Vitals:   08/27/19 0823  BP: 136/80  Pulse: 77  Resp: 16  Temp: 98.1 F (36.7 C)  SpO2: 99%   BP Readings from Last 3 Encounters:  08/27/19 136/80  02/24/19 118/60  10/11/18 120/78   Wt Readings from Last 3 Encounters:  08/27/19 163 lb 12.8 oz (74.3 kg)  02/24/19 167 lb (75.8 kg)  10/11/18 167 lb 4 oz (75.9 kg)   Body mass index is 27.68 kg/m.   Physical Exam    Constitutional: Appears well-developed and well-nourished. No distress.  HENT:  Head: Normocephalic and atraumatic.  Neck: Neck supple. No tracheal deviation present. No thyromegaly present.  No cervical lymphadenopathy Cardiovascular: Normal rate, regular rhythm and normal heart sounds.   No murmur heard. No carotid bruit .  No edema Pulmonary/Chest: Effort normal and breath sounds normal. No respiratory distress. No has no wheezes. No rales.  Skin: Skin is warm and dry. Not diaphoretic.  Psychiatric: Normal mood and affect. Behavior is normal.      Assessment & Plan:    See Problem List for Assessment and Plan of chronic medical problems.     This visit occurred during the SARS-CoV-2 public health emergency.  Safety protocols were in place, including screening questions prior to the visit, additional usage of staff PPE, and extensive cleaning of exam room while observing appropriate contact time as indicated for disinfecting solutions.

## 2019-08-27 ENCOUNTER — Other Ambulatory Visit: Payer: Medicare Other

## 2019-08-27 ENCOUNTER — Other Ambulatory Visit (INDEPENDENT_AMBULATORY_CARE_PROVIDER_SITE_OTHER): Payer: Medicare Other

## 2019-08-27 ENCOUNTER — Ambulatory Visit (INDEPENDENT_AMBULATORY_CARE_PROVIDER_SITE_OTHER): Payer: Medicare Other | Admitting: Internal Medicine

## 2019-08-27 ENCOUNTER — Other Ambulatory Visit: Payer: Self-pay

## 2019-08-27 ENCOUNTER — Encounter: Payer: Self-pay | Admitting: Internal Medicine

## 2019-08-27 VITALS — BP 136/80 | HR 77 | Temp 98.1°F | Resp 16 | Ht 64.5 in | Wt 163.8 lb

## 2019-08-27 DIAGNOSIS — G6289 Other specified polyneuropathies: Secondary | ICD-10-CM | POA: Diagnosis not present

## 2019-08-27 DIAGNOSIS — E119 Type 2 diabetes mellitus without complications: Secondary | ICD-10-CM

## 2019-08-27 DIAGNOSIS — E781 Pure hyperglyceridemia: Secondary | ICD-10-CM

## 2019-08-27 DIAGNOSIS — I10 Essential (primary) hypertension: Secondary | ICD-10-CM

## 2019-08-27 DIAGNOSIS — N1831 Chronic kidney disease, stage 3a: Secondary | ICD-10-CM

## 2019-08-27 DIAGNOSIS — R42 Dizziness and giddiness: Secondary | ICD-10-CM | POA: Insufficient documentation

## 2019-08-27 HISTORY — DX: Dizziness and giddiness: R42

## 2019-08-27 LAB — CBC WITH DIFFERENTIAL/PLATELET
Basophils Absolute: 0 10*3/uL (ref 0.0–0.1)
Basophils Relative: 0.4 % (ref 0.0–3.0)
Eosinophils Absolute: 0.1 10*3/uL (ref 0.0–0.7)
Eosinophils Relative: 2.2 % (ref 0.0–5.0)
HCT: 38 % (ref 36.0–46.0)
Hemoglobin: 12.9 g/dL (ref 12.0–15.0)
Lymphocytes Relative: 33.6 % (ref 12.0–46.0)
Lymphs Abs: 1.8 10*3/uL (ref 0.7–4.0)
MCHC: 33.9 g/dL (ref 30.0–36.0)
MCV: 88.3 fl (ref 78.0–100.0)
Monocytes Absolute: 0.6 10*3/uL (ref 0.1–1.0)
Monocytes Relative: 11.1 % (ref 3.0–12.0)
Neutro Abs: 2.8 10*3/uL (ref 1.4–7.7)
Neutrophils Relative %: 52.7 % (ref 43.0–77.0)
Platelets: 187 10*3/uL (ref 150.0–400.0)
RBC: 4.3 Mil/uL (ref 3.87–5.11)
RDW: 13.4 % (ref 11.5–15.5)
WBC: 5.4 10*3/uL (ref 4.0–10.5)

## 2019-08-27 LAB — COMPREHENSIVE METABOLIC PANEL
ALT: 53 U/L — ABNORMAL HIGH (ref 0–35)
AST: 46 U/L — ABNORMAL HIGH (ref 0–37)
Albumin: 4.1 g/dL (ref 3.5–5.2)
Alkaline Phosphatase: 63 U/L (ref 39–117)
BUN: 19 mg/dL (ref 6–23)
CO2: 28 mEq/L (ref 19–32)
Calcium: 9.7 mg/dL (ref 8.4–10.5)
Chloride: 102 mEq/L (ref 96–112)
Creatinine, Ser: 1.15 mg/dL (ref 0.40–1.20)
GFR: 45.65 mL/min — ABNORMAL LOW (ref >60.00)
Glucose, Bld: 136 mg/dL — ABNORMAL HIGH (ref 70–99)
Potassium: 3.9 mEq/L (ref 3.5–5.1)
Sodium: 140 mEq/L (ref 135–145)
Total Bilirubin: 1.5 mg/dL — ABNORMAL HIGH (ref 0.2–1.2)
Total Protein: 7.1 g/dL (ref 6.0–8.3)

## 2019-08-27 LAB — LIPID PANEL
Cholesterol: 160 mg/dL (ref 0–200)
HDL: 38.7 mg/dL — ABNORMAL LOW (ref >39.00)
LDL Cholesterol: 85 mg/dL (ref 0–99)
NonHDL: 121.41
Total CHOL/HDL Ratio: 4
Triglycerides: 184 mg/dL — ABNORMAL HIGH (ref 0.0–149.0)
VLDL: 36.8 mg/dL (ref 0.0–40.0)

## 2019-08-27 LAB — HEMOGLOBIN A1C: Hgb A1c MFr Bld: 6.6 % — ABNORMAL HIGH (ref 4.6–6.5)

## 2019-08-27 MED ORDER — METFORMIN HCL 500 MG PO TABS: ORAL_TABLET | ORAL | 1 refills | Status: DC

## 2019-08-27 NOTE — Assessment & Plan Note (Signed)
Check lipid panel  Diet controlled, statin intolerant Regular exercise and healthy diet encouraged

## 2019-08-27 NOTE — Assessment & Plan Note (Signed)
Chronic Cmp, cbc 

## 2019-08-27 NOTE — Assessment & Plan Note (Addendum)
Denies any problems with neuropathy now Denies pain, N/T

## 2019-08-27 NOTE — Assessment & Plan Note (Signed)
BP well controlled Current regimen effective and well tolerated Continue current medications at current doses cmp  

## 2019-08-27 NOTE — Assessment & Plan Note (Signed)
Occurs with changes in head position Likely vestibular Will try vestibular exercises at home Can consider PT if not effective

## 2019-08-27 NOTE — Assessment & Plan Note (Addendum)
Check a1c Low sugar / carb diet Encouraged regular exercise Continue metformin

## 2019-08-28 ENCOUNTER — Encounter: Payer: Self-pay | Admitting: Internal Medicine

## 2019-10-07 ENCOUNTER — Other Ambulatory Visit: Payer: Self-pay | Admitting: Internal Medicine

## 2019-10-10 ENCOUNTER — Ambulatory Visit: Payer: Medicare Other | Attending: Internal Medicine

## 2019-10-10 DIAGNOSIS — Z23 Encounter for immunization: Secondary | ICD-10-CM | POA: Insufficient documentation

## 2019-10-10 NOTE — Progress Notes (Signed)
   Covid-19 Vaccination Clinic  Name:  [name]    MRN: [id number] DOB: [birth year]  10/10/2019  [name] was observed post Covid-19 immunization for 15 minutes without incidence. She was provided with Vaccine Information Sheet and instruction to access the V-Safe system.   [name] was instructed to call 911 with any severe reactions post vaccine: Marland Kitchen Difficulty breathing  . Swelling of your face and throat  . A fast heartbeat  . A bad rash all over your body  . Dizziness and weakness    Immunizations Administered    Name Date Dose VIS Date Route   Pfizer COVID-19 Vaccine 10/10/2019  4:24 PM 0.3 mL 08/29/2019 Intramuscular   Manufacturer: [place]   Lot: [id number]   [place]: [id number]

## 2019-10-20 ENCOUNTER — Other Ambulatory Visit: Payer: Self-pay | Admitting: Internal Medicine

## 2019-10-31 ENCOUNTER — Ambulatory Visit: Payer: Medicare Other | Attending: Internal Medicine

## 2019-10-31 DIAGNOSIS — Z23 Encounter for immunization: Secondary | ICD-10-CM

## 2019-10-31 NOTE — Progress Notes (Signed)
   Covid-19 Vaccination Clinic  Name:  [name]    MRN: [id number] DOB: 07/21/1941  10/31/2019  [name] was observed post Covid-19 immunization for 15 minutes without incidence. She was provided with Vaccine Information Sheet and instruction to access the V-Safe system.   [name] was instructed to call 911 with any severe reactions post vaccine: Marland Kitchen Difficulty breathing  . Swelling of your face and throat  . A fast heartbeat  . A bad rash all over your body  . Dizziness and weakness    Immunizations Administered    Name Date Dose VIS Date Route   Pfizer COVID-19 Vaccine 10/31/2019 10:04 AM 0.3 mL 08/29/2019 Intramuscular   Manufacturer: [place]   Lot: [id number]   [place]: [id number]

## 2019-11-11 ENCOUNTER — Other Ambulatory Visit: Payer: Self-pay | Admitting: Internal Medicine

## 2020-01-30 ENCOUNTER — Inpatient Hospital Stay (HOSPITAL_COMMUNITY)
Admission: EM | Admit: 2020-01-30 | Discharge: 2020-02-07 | DRG: 871 | Disposition: A | Discharge status: Skilled Nursing Facility | Payer: Medicare Other | Attending: Infectious Disease | Admitting: Infectious Disease

## 2020-01-30 ENCOUNTER — Other Ambulatory Visit: Payer: Self-pay

## 2020-01-30 DIAGNOSIS — I4891 Unspecified atrial fibrillation: Secondary | ICD-10-CM | POA: Diagnosis present

## 2020-01-30 DIAGNOSIS — R6521 Severe sepsis with septic shock: Secondary | ICD-10-CM | POA: Diagnosis not present

## 2020-01-30 DIAGNOSIS — J9811 Atelectasis: Secondary | ICD-10-CM | POA: Diagnosis present

## 2020-01-30 DIAGNOSIS — Z9882 Breast implant status: Secondary | ICD-10-CM | POA: Diagnosis not present

## 2020-01-30 DIAGNOSIS — Z803 Family history of malignant neoplasm of breast: Secondary | ICD-10-CM

## 2020-01-30 DIAGNOSIS — I129 Hypertensive chronic kidney disease with stage 1 through stage 4 chronic kidney disease, or unspecified chronic kidney disease: Secondary | ICD-10-CM | POA: Diagnosis not present

## 2020-01-30 DIAGNOSIS — K219 Gastro-esophageal reflux disease without esophagitis: Secondary | ICD-10-CM | POA: Diagnosis not present

## 2020-01-30 DIAGNOSIS — A4151 Sepsis due to Escherichia coli [E. coli]: Principal | ICD-10-CM | POA: Diagnosis present

## 2020-01-30 DIAGNOSIS — N12 Tubulo-interstitial nephritis, not specified as acute or chronic: Secondary | ICD-10-CM | POA: Diagnosis present

## 2020-01-30 DIAGNOSIS — R296 Repeated falls: Secondary | ICD-10-CM | POA: Diagnosis not present

## 2020-01-30 DIAGNOSIS — Z87891 Personal history of nicotine dependence: Secondary | ICD-10-CM

## 2020-01-30 DIAGNOSIS — R0689 Other abnormalities of breathing: Secondary | ICD-10-CM | POA: Diagnosis not present

## 2020-01-30 DIAGNOSIS — I10 Essential (primary) hypertension: Secondary | ICD-10-CM | POA: Diagnosis present

## 2020-01-30 DIAGNOSIS — R55 Syncope and collapse: Secondary | ICD-10-CM | POA: Diagnosis not present

## 2020-01-30 DIAGNOSIS — A419 Sepsis, unspecified organism: Secondary | ICD-10-CM | POA: Diagnosis present

## 2020-01-30 DIAGNOSIS — E1122 Type 2 diabetes mellitus with diabetic chronic kidney disease: Secondary | ICD-10-CM | POA: Diagnosis not present

## 2020-01-30 DIAGNOSIS — R509 Fever, unspecified: Secondary | ICD-10-CM | POA: Diagnosis not present

## 2020-01-30 DIAGNOSIS — E1165 Type 2 diabetes mellitus with hyperglycemia: Secondary | ICD-10-CM | POA: Diagnosis not present

## 2020-01-30 DIAGNOSIS — R6520 Severe sepsis without septic shock: Secondary | ICD-10-CM | POA: Diagnosis not present

## 2020-01-30 DIAGNOSIS — E872 Acidosis, unspecified: Secondary | ICD-10-CM | POA: Diagnosis present

## 2020-01-30 DIAGNOSIS — R0902 Hypoxemia: Secondary | ICD-10-CM

## 2020-01-30 DIAGNOSIS — E876 Hypokalemia: Secondary | ICD-10-CM | POA: Diagnosis present

## 2020-01-30 DIAGNOSIS — G20 Parkinson's disease: Secondary | ICD-10-CM | POA: Diagnosis present

## 2020-01-30 DIAGNOSIS — R259 Unspecified abnormal involuntary movements: Secondary | ICD-10-CM | POA: Diagnosis present

## 2020-01-30 DIAGNOSIS — R000 Tachycardia, unspecified: Secondary | ICD-10-CM | POA: Diagnosis not present

## 2020-01-30 DIAGNOSIS — Z8249 Family history of ischemic heart disease and other diseases of the circulatory system: Secondary | ICD-10-CM

## 2020-01-30 DIAGNOSIS — N179 Acute kidney failure, unspecified: Secondary | ICD-10-CM | POA: Diagnosis present

## 2020-01-30 DIAGNOSIS — N183 Chronic kidney disease, stage 3 unspecified: Secondary | ICD-10-CM | POA: Diagnosis present

## 2020-01-30 DIAGNOSIS — I4892 Unspecified atrial flutter: Secondary | ICD-10-CM | POA: Diagnosis present

## 2020-01-30 DIAGNOSIS — Z743 Need for continuous supervision: Secondary | ICD-10-CM | POA: Diagnosis not present

## 2020-01-30 DIAGNOSIS — E861 Hypovolemia: Secondary | ICD-10-CM | POA: Diagnosis not present

## 2020-01-30 DIAGNOSIS — Z833 Family history of diabetes mellitus: Secondary | ICD-10-CM

## 2020-01-30 DIAGNOSIS — N390 Urinary tract infection, site not specified: Secondary | ICD-10-CM | POA: Diagnosis present

## 2020-01-30 DIAGNOSIS — A4150 Gram-negative sepsis, unspecified: Secondary | ICD-10-CM | POA: Diagnosis not present

## 2020-01-30 DIAGNOSIS — Z853 Personal history of malignant neoplasm of breast: Secondary | ICD-10-CM

## 2020-01-30 DIAGNOSIS — E1142 Type 2 diabetes mellitus with diabetic polyneuropathy: Secondary | ICD-10-CM | POA: Diagnosis present

## 2020-01-30 DIAGNOSIS — R42 Dizziness and giddiness: Secondary | ICD-10-CM | POA: Diagnosis not present

## 2020-01-30 DIAGNOSIS — N1831 Chronic kidney disease, stage 3a: Secondary | ICD-10-CM | POA: Diagnosis present

## 2020-01-30 DIAGNOSIS — I447 Left bundle-branch block, unspecified: Secondary | ICD-10-CM | POA: Diagnosis not present

## 2020-01-30 DIAGNOSIS — Z20822 Contact with and (suspected) exposure to covid-19: Secondary | ICD-10-CM | POA: Diagnosis not present

## 2020-01-30 DIAGNOSIS — Z9013 Acquired absence of bilateral breasts and nipples: Secondary | ICD-10-CM

## 2020-01-30 NOTE — ED Triage Notes (Signed)
Pt from home by EMS for fever and syncope. Husband reported pt had fallen 3x today after haivng syncopal episodes. Husband reported to EMS last fall at 2000, took around an hour for pt to become to baseline. Orthostatics attempted with EMS--lying bp 130/60, pulse 120; standing pulse increased to 144, no palpable radials and pt became lightheaded and faint. She has a bruise to [place] R shoulder from falling into the shower stall. Pt reports feeling weak with decreased PO intake for the past 2 days. Pt received tylenol around 2200 and 216ml NS . Warm to the touch. A&Ox3. Hx of UTI, denies urinary symptoms. No known covid exposures, has both vaccinations

## 2020-01-31 ENCOUNTER — Emergency Department (HOSPITAL_COMMUNITY): Payer: Medicare Other

## 2020-01-31 ENCOUNTER — Inpatient Hospital Stay (HOSPITAL_COMMUNITY): Payer: Medicare Other

## 2020-01-31 ENCOUNTER — Encounter (HOSPITAL_COMMUNITY): Payer: Self-pay | Admitting: Internal Medicine

## 2020-01-31 ENCOUNTER — Other Ambulatory Visit: Payer: Self-pay

## 2020-01-31 DIAGNOSIS — M2550 Pain in unspecified joint: Secondary | ICD-10-CM | POA: Diagnosis not present

## 2020-01-31 DIAGNOSIS — I4892 Unspecified atrial flutter: Secondary | ICD-10-CM | POA: Diagnosis not present

## 2020-01-31 DIAGNOSIS — I129 Hypertensive chronic kidney disease with stage 1 through stage 4 chronic kidney disease, or unspecified chronic kidney disease: Secondary | ICD-10-CM | POA: Diagnosis present

## 2020-01-31 DIAGNOSIS — E861 Hypovolemia: Secondary | ICD-10-CM | POA: Diagnosis present

## 2020-01-31 DIAGNOSIS — R509 Fever, unspecified: Secondary | ICD-10-CM | POA: Diagnosis not present

## 2020-01-31 DIAGNOSIS — R5381 Other malaise: Secondary | ICD-10-CM | POA: Diagnosis not present

## 2020-01-31 DIAGNOSIS — E1142 Type 2 diabetes mellitus with diabetic polyneuropathy: Secondary | ICD-10-CM | POA: Diagnosis not present

## 2020-01-31 DIAGNOSIS — N12 Tubulo-interstitial nephritis, not specified as acute or chronic: Secondary | ICD-10-CM | POA: Diagnosis not present

## 2020-01-31 DIAGNOSIS — E872 Acidosis, unspecified: Secondary | ICD-10-CM

## 2020-01-31 DIAGNOSIS — A419 Sepsis, unspecified organism: Secondary | ICD-10-CM

## 2020-01-31 DIAGNOSIS — I10 Essential (primary) hypertension: Secondary | ICD-10-CM | POA: Diagnosis not present

## 2020-01-31 DIAGNOSIS — N183 Chronic kidney disease, stage 3 unspecified: Secondary | ICD-10-CM | POA: Diagnosis present

## 2020-01-31 DIAGNOSIS — R296 Repeated falls: Secondary | ICD-10-CM | POA: Diagnosis present

## 2020-01-31 DIAGNOSIS — W19XXXA Unspecified fall, initial encounter: Secondary | ICD-10-CM | POA: Diagnosis not present

## 2020-01-31 DIAGNOSIS — Z853 Personal history of malignant neoplasm of breast: Secondary | ICD-10-CM | POA: Diagnosis not present

## 2020-01-31 DIAGNOSIS — K7460 Unspecified cirrhosis of liver: Secondary | ICD-10-CM | POA: Diagnosis not present

## 2020-01-31 DIAGNOSIS — E1165 Type 2 diabetes mellitus with hyperglycemia: Secondary | ICD-10-CM | POA: Diagnosis not present

## 2020-01-31 DIAGNOSIS — Z20822 Contact with and (suspected) exposure to covid-19: Secondary | ICD-10-CM | POA: Diagnosis not present

## 2020-01-31 DIAGNOSIS — R6521 Severe sepsis with septic shock: Secondary | ICD-10-CM

## 2020-01-31 DIAGNOSIS — A4150 Gram-negative sepsis, unspecified: Secondary | ICD-10-CM | POA: Diagnosis present

## 2020-01-31 DIAGNOSIS — Z9882 Breast implant status: Secondary | ICD-10-CM | POA: Diagnosis not present

## 2020-01-31 DIAGNOSIS — Z743 Need for continuous supervision: Secondary | ICD-10-CM | POA: Diagnosis not present

## 2020-01-31 DIAGNOSIS — N179 Acute kidney failure, unspecified: Secondary | ICD-10-CM | POA: Diagnosis not present

## 2020-01-31 DIAGNOSIS — Z87891 Personal history of nicotine dependence: Secondary | ICD-10-CM | POA: Diagnosis not present

## 2020-01-31 DIAGNOSIS — R6520 Severe sepsis without septic shock: Secondary | ICD-10-CM

## 2020-01-31 DIAGNOSIS — E1122 Type 2 diabetes mellitus with diabetic chronic kidney disease: Secondary | ICD-10-CM | POA: Diagnosis not present

## 2020-01-31 DIAGNOSIS — J9811 Atelectasis: Secondary | ICD-10-CM | POA: Diagnosis not present

## 2020-01-31 DIAGNOSIS — I4891 Unspecified atrial fibrillation: Secondary | ICD-10-CM | POA: Diagnosis not present

## 2020-01-31 DIAGNOSIS — A4151 Sepsis due to Escherichia coli [E. coli]: Secondary | ICD-10-CM | POA: Diagnosis not present

## 2020-01-31 DIAGNOSIS — N390 Urinary tract infection, site not specified: Secondary | ICD-10-CM | POA: Diagnosis present

## 2020-01-31 DIAGNOSIS — R259 Unspecified abnormal involuntary movements: Secondary | ICD-10-CM

## 2020-01-31 DIAGNOSIS — E876 Hypokalemia: Secondary | ICD-10-CM | POA: Diagnosis present

## 2020-01-31 DIAGNOSIS — Z7401 Bed confinement status: Secondary | ICD-10-CM | POA: Diagnosis not present

## 2020-01-31 DIAGNOSIS — N1831 Chronic kidney disease, stage 3a: Secondary | ICD-10-CM | POA: Diagnosis not present

## 2020-01-31 DIAGNOSIS — R29818 Other symptoms and signs involving the nervous system: Secondary | ICD-10-CM

## 2020-01-31 DIAGNOSIS — G20 Parkinson's disease: Secondary | ICD-10-CM | POA: Diagnosis present

## 2020-01-31 DIAGNOSIS — R000 Tachycardia, unspecified: Secondary | ICD-10-CM | POA: Diagnosis not present

## 2020-01-31 DIAGNOSIS — K219 Gastro-esophageal reflux disease without esophagitis: Secondary | ICD-10-CM | POA: Diagnosis present

## 2020-01-31 HISTORY — DX: Urinary tract infection, site not specified: N39.0

## 2020-01-31 HISTORY — DX: Other symptoms and signs involving the nervous system: R29.818

## 2020-01-31 HISTORY — DX: Gram-negative sepsis, unspecified: A41.50

## 2020-01-31 HISTORY — DX: Acidosis, unspecified: E87.20

## 2020-01-31 HISTORY — DX: Sepsis, unspecified organism: A41.9

## 2020-01-31 HISTORY — DX: Acidosis: E87.2

## 2020-01-31 HISTORY — DX: Unspecified abnormal involuntary movements: R25.9

## 2020-01-31 HISTORY — DX: Chronic kidney disease, stage 3 unspecified: N18.30

## 2020-01-31 HISTORY — DX: Severe sepsis without septic shock: R65.20

## 2020-01-31 HISTORY — DX: Acute kidney failure, unspecified: N17.9

## 2020-01-31 HISTORY — DX: Hypokalemia: E87.6

## 2020-01-31 LAB — BLOOD CULTURE ID PANEL (REFLEXED)

## 2020-01-31 LAB — CBC WITH DIFFERENTIAL/PLATELET
Abs Immature Granulocytes: 0.1 10*3/uL — ABNORMAL HIGH (ref 0.00–0.07)
Abs Immature Granulocytes: 0.06 10*3/uL (ref 0.00–0.07)
Basophils Absolute: 0 10*3/uL (ref 0.0–0.1)
Basophils Absolute: 0 10*3/uL (ref 0.0–0.1)
Basophils Relative: 0 %
Basophils Relative: 0 %
Eosinophils Absolute: 0 10*3/uL (ref 0.0–0.5)
Eosinophils Absolute: 0 10*3/uL (ref 0.0–0.5)
Eosinophils Relative: 0 %
Eosinophils Relative: 0 %
HCT: 37.4 % (ref 36.0–46.0)
HCT: 34.3 % — ABNORMAL LOW (ref 36.0–46.0)
Hemoglobin: 12.5 g/dL (ref 12.0–15.0)
Hemoglobin: 11.4 g/dL — ABNORMAL LOW (ref 12.0–15.0)
Immature Granulocytes: 1 %
Immature Granulocytes: 1 %
Lymphocytes Relative: 4 %
Lymphocytes Relative: 4 %
Lymphs Abs: 0.6 10*3/uL — ABNORMAL LOW (ref 0.7–4.0)
Lymphs Abs: 0.4 10*3/uL — ABNORMAL LOW (ref 0.7–4.0)
MCH: 29.3 pg (ref 26.0–34.0)
MCH: 29.5 pg (ref 26.0–34.0)
MCHC: 33.4 g/dL (ref 30.0–36.0)
MCHC: 33.2 g/dL (ref 30.0–36.0)
MCV: 87.6 fL (ref 80.0–100.0)
MCV: 88.6 fL (ref 80.0–100.0)
Monocytes Absolute: 1.7 10*3/uL — ABNORMAL HIGH (ref 0.1–1.0)
Monocytes Absolute: 0.8 10*3/uL (ref 0.1–1.0)
Monocytes Relative: 12 %
Monocytes Relative: 8 %
Neutro Abs: 12 10*3/uL — ABNORMAL HIGH (ref 1.7–7.7)
Neutro Abs: 7.9 10*3/uL — ABNORMAL HIGH (ref 1.7–7.7)
Neutrophils Relative %: 83 %
Neutrophils Relative %: 87 %
Platelets: 176 10*3/uL (ref 150–400)
Platelets: 151 10*3/uL (ref 150–400)
RBC: 4.27 MIL/uL (ref 3.87–5.11)
RBC: 3.87 MIL/uL (ref 3.87–5.11)
RDW: 12.9 % (ref 11.5–15.5)
RDW: 13.1 % (ref 11.5–15.5)
WBC: 14.5 10*3/uL — ABNORMAL HIGH (ref 4.0–10.5)
WBC: 9.1 10*3/uL (ref 4.0–10.5)
nRBC: 0 % (ref 0.0–0.2)
nRBC: 0 % (ref 0.0–0.2)

## 2020-01-31 LAB — COMPREHENSIVE METABOLIC PANEL
ALT: 28 U/L (ref 0–44)
ALT: 26 U/L (ref 0–44)
AST: 32 U/L (ref 15–41)
AST: 32 U/L (ref 15–41)
Albumin: 2.8 g/dL — ABNORMAL LOW (ref 3.5–5.0)
Albumin: 2.3 g/dL — ABNORMAL LOW (ref 3.5–5.0)
Alkaline Phosphatase: 45 U/L (ref 38–126)
Alkaline Phosphatase: 65 U/L (ref 38–126)
Anion gap: 14 (ref 5–15)
Anion gap: 14 (ref 5–15)
BUN: 23 mg/dL (ref 8–23)
BUN: 22 mg/dL (ref 8–23)
CO2: 21 mmol/L — ABNORMAL LOW (ref 22–32)
CO2: 19 mmol/L — ABNORMAL LOW (ref 22–32)
Calcium: 8.4 mg/dL — ABNORMAL LOW (ref 8.9–10.3)
Calcium: 7.7 mg/dL — ABNORMAL LOW (ref 8.9–10.3)
Chloride: 101 mmol/L (ref 98–111)
Chloride: 109 mmol/L (ref 98–111)
Creatinine, Ser: 1.81 mg/dL — ABNORMAL HIGH (ref 0.44–1.00)
Creatinine, Ser: 1.6 mg/dL — ABNORMAL HIGH (ref 0.44–1.00)
GFR calc Af Amer: 30 mL/min — ABNORMAL LOW (ref >60)
GFR calc Af Amer: 35 mL/min — ABNORMAL LOW (ref >60)
GFR calc non Af Amer: 26 mL/min — ABNORMAL LOW (ref >60)
GFR calc non Af Amer: 31 mL/min — ABNORMAL LOW (ref >60)
Glucose, Bld: 221 mg/dL — ABNORMAL HIGH (ref 70–99)
Glucose, Bld: 153 mg/dL — ABNORMAL HIGH (ref 70–99)
Potassium: 2.7 mmol/L — CL (ref 3.5–5.1)
Potassium: 3.5 mmol/L (ref 3.5–5.1)
Sodium: 136 mmol/L (ref 135–145)
Sodium: 142 mmol/L (ref 135–145)
Total Bilirubin: 2.4 mg/dL — ABNORMAL HIGH (ref 0.3–1.2)
Total Bilirubin: 1.9 mg/dL — ABNORMAL HIGH (ref 0.3–1.2)
Total Protein: 6.3 g/dL — ABNORMAL LOW (ref 6.5–8.1)
Total Protein: 5.5 g/dL — ABNORMAL LOW (ref 6.5–8.1)

## 2020-01-31 LAB — GLUCOSE, CAPILLARY
Glucose-Capillary: 86 mg/dL (ref 70–99)
Glucose-Capillary: 162 mg/dL — ABNORMAL HIGH (ref 70–99)

## 2020-01-31 LAB — PROTIME-INR
INR: 1.3 — ABNORMAL HIGH (ref 0.8–1.2)
INR: 1.4 — ABNORMAL HIGH (ref 0.8–1.2)
Prothrombin Time: 15.8 seconds — ABNORMAL HIGH (ref 11.4–15.2)
Prothrombin Time: 16.2 seconds — ABNORMAL HIGH (ref 11.4–15.2)

## 2020-01-31 LAB — ECHOCARDIOGRAM COMPLETE
Height: 64 in
Weight: 2592 oz

## 2020-01-31 LAB — URINALYSIS, ROUTINE W REFLEX MICROSCOPIC
Bilirubin Urine: NEGATIVE
Glucose, UA: NEGATIVE mg/dL
Ketones, ur: 5 mg/dL — AB
Nitrite: NEGATIVE
Protein, ur: 100 mg/dL — AB
Specific Gravity, Urine: 1.011 (ref 1.005–1.030)
WBC, UA: >50 WBC/hpf — ABNORMAL HIGH (ref 0–5)
pH: 5 (ref 5.0–8.0)

## 2020-01-31 LAB — CORTISOL-AM, BLOOD: Cortisol - AM: 45.8 ug/dL — ABNORMAL HIGH (ref 6.7–22.6)

## 2020-01-31 LAB — LACTIC ACID, PLASMA
Lactic Acid, Venous: 2.3 mmol/L (ref 0.5–1.9)
Lactic Acid, Venous: 2 mmol/L (ref 0.5–1.9)
Lactic Acid, Venous: 2.4 mmol/L (ref 0.5–1.9)

## 2020-01-31 LAB — CBG MONITORING, ED
Glucose-Capillary: 158 mg/dL — ABNORMAL HIGH (ref 70–99)
Glucose-Capillary: 162 mg/dL — ABNORMAL HIGH (ref 70–99)

## 2020-01-31 LAB — MRSA PCR SCREENING: MRSA by PCR: NEGATIVE

## 2020-01-31 LAB — HEMOGLOBIN A1C
Hgb A1c MFr Bld: 6.5 % — ABNORMAL HIGH (ref 4.8–5.6)
Mean Plasma Glucose: 139.85 mg/dL

## 2020-01-31 LAB — SARS CORONAVIRUS 2 BY RT PCR (HOSPITAL ORDER, PERFORMED IN ~~LOC~~ HOSPITAL LAB): SARS Coronavirus 2: NEGATIVE

## 2020-01-31 LAB — PROCALCITONIN: Procalcitonin: 12.36 ng/mL

## 2020-01-31 LAB — APTT: aPTT: 38 seconds — ABNORMAL HIGH (ref 24–36)

## 2020-01-31 LAB — TROPONIN I (HIGH SENSITIVITY): Troponin I (High Sensitivity): 36 ng/L — ABNORMAL HIGH (ref <18)

## 2020-01-31 MED ORDER — CLONIDINE HCL 0.2 MG PO TABS
0.2000 mg | ORAL_TABLET | Freq: Two times a day (BID) | ORAL | Status: DC
  Administered 2020-01-31: 0.2 mg via ORAL
  Filled 2020-01-31: qty 1

## 2020-01-31 MED ORDER — SODIUM CHLORIDE 0.9 % IV SOLN: 250.0000 mL | INTRAVENOUS | Status: DC

## 2020-01-31 MED ORDER — SODIUM CHLORIDE 0.9 % IV SOLN
1.0000 g | Freq: Once | INTRAVENOUS | Status: AC
  Administered 2020-01-31: 1 g via INTRAVENOUS
  Filled 2020-01-31: qty 10

## 2020-01-31 MED ORDER — NOREPINEPHRINE 4 MG/250ML-% IV SOLN
0.0000 ug/min | INTRAVENOUS | Status: DC
  Filled 2020-01-31: qty 250

## 2020-01-31 MED ORDER — ACETAMINOPHEN 325 MG PO TABS
650.0000 mg | ORAL_TABLET | Freq: Four times a day (QID) | ORAL | Status: DC | PRN
  Administered 2020-02-01: 650 mg via ORAL
  Filled 2020-01-31: qty 2

## 2020-01-31 MED ORDER — MAGNESIUM SULFATE 2 GM/50ML IV SOLN
2.0000 g | Freq: Once | INTRAVENOUS | Status: AC
  Administered 2020-01-31: 2 g via INTRAVENOUS
  Filled 2020-01-31: qty 50

## 2020-01-31 MED ORDER — PIPERACILLIN-TAZOBACTAM 3.375 G IVPB
3.3750 g | Freq: Three times a day (TID) | INTRAVENOUS | Status: DC
  Administered 2020-01-31 – 2020-02-01 (×4): 3.375 g via INTRAVENOUS
  Filled 2020-01-31 (×5): qty 50

## 2020-01-31 MED ORDER — ALBUTEROL SULFATE (2.5 MG/3ML) 0.083% IN NEBU
2.5000 mg | INHALATION_SOLUTION | RESPIRATORY_TRACT | Status: DC | PRN
  Administered 2020-02-02: 2.5 mg via RESPIRATORY_TRACT
  Filled 2020-01-31: qty 3

## 2020-01-31 MED ORDER — SODIUM CHLORIDE 0.9 % IV BOLUS
1000.0000 mL | Freq: Once | INTRAVENOUS | Status: AC
  Administered 2020-01-31: 1000 mL via INTRAVENOUS

## 2020-01-31 MED ORDER — CARBIDOPA-LEVODOPA 25-100 MG PO TABS
1.0000 | ORAL_TABLET | Freq: Three times a day (TID) | ORAL | Status: DC
  Administered 2020-01-31 – 2020-02-06 (×21): 1 via ORAL
  Filled 2020-01-31 (×23): qty 1

## 2020-01-31 MED ORDER — ONDANSETRON HCL 4 MG PO TABS: 4.0000 mg | ORAL_TABLET | Freq: Four times a day (QID) | ORAL | Status: DC | PRN

## 2020-01-31 MED ORDER — LACTATED RINGERS IV SOLN: INTRAVENOUS | Status: DC

## 2020-01-31 MED ORDER — POTASSIUM CHLORIDE 10 MEQ/100ML IV SOLN
10.0000 meq | INTRAVENOUS | Status: AC
  Administered 2020-01-31 (×3): 10 meq via INTRAVENOUS
  Filled 2020-01-31 (×3): qty 100

## 2020-01-31 MED ORDER — SODIUM CHLORIDE 0.9 % IV BOLUS: 1000.0000 mL | Freq: Once | INTRAVENOUS | Status: DC

## 2020-01-31 MED ORDER — WHITE PETROLATUM EX OINT
TOPICAL_OINTMENT | CUTANEOUS | Status: AC
  Filled 2020-01-31: qty 28.35

## 2020-01-31 MED ORDER — NOREPINEPHRINE 4 MG/250ML-% IV SOLN
2.0000 ug/min | INTRAVENOUS | Status: DC
  Administered 2020-01-31: 2 ug/min via INTRAVENOUS

## 2020-01-31 MED ORDER — CHLORHEXIDINE GLUCONATE CLOTH 2 % EX PADS
6.0000 | MEDICATED_PAD | Freq: Every day | CUTANEOUS | Status: DC
  Administered 2020-01-31 – 2020-02-06 (×5): 6 via TOPICAL

## 2020-01-31 MED ORDER — POTASSIUM CHLORIDE CRYS ER 20 MEQ PO TBCR
40.0000 meq | EXTENDED_RELEASE_TABLET | Freq: Once | ORAL | Status: AC
  Administered 2020-01-31: 40 meq via ORAL
  Filled 2020-01-31: qty 2

## 2020-01-31 MED ORDER — POLYETHYLENE GLYCOL 3350 17 G PO PACK: 17.0000 g | PACK | Freq: Every day | ORAL | Status: DC | PRN

## 2020-01-31 MED ORDER — ACETAMINOPHEN 500 MG PO TABS
1000.0000 mg | ORAL_TABLET | Freq: Once | ORAL | Status: AC
  Administered 2020-01-31: 1000 mg via ORAL
  Filled 2020-01-31: qty 2

## 2020-01-31 MED ORDER — LACTATED RINGERS IV BOLUS
1000.0000 mL | Freq: Once | INTRAVENOUS | Status: AC
  Administered 2020-01-31: 1000 mL via INTRAVENOUS

## 2020-01-31 MED ORDER — ORAL CARE MOUTH RINSE
15.0000 mL | Freq: Two times a day (BID) | OROMUCOSAL | Status: DC
  Administered 2020-01-31 – 2020-02-06 (×10): 15 mL via OROMUCOSAL

## 2020-01-31 MED ORDER — ONDANSETRON HCL 4 MG/2ML IJ SOLN: 4.0000 mg | Freq: Four times a day (QID) | INTRAMUSCULAR | Status: DC | PRN

## 2020-01-31 MED ORDER — ENOXAPARIN SODIUM 30 MG/0.3ML ~~LOC~~ SOLN
30.0000 mg | SUBCUTANEOUS | Status: DC
  Administered 2020-01-31 – 2020-02-02 (×3): 30 mg via SUBCUTANEOUS
  Filled 2020-01-31 (×3): qty 0.3

## 2020-01-31 MED ORDER — ACETAMINOPHEN 650 MG RE SUPP: 650.0000 mg | Freq: Four times a day (QID) | RECTAL | Status: DC | PRN

## 2020-01-31 MED ORDER — SODIUM CHLORIDE 0.9 % IV BOLUS
500.0000 mL | Freq: Once | INTRAVENOUS | Status: AC
  Administered 2020-01-31: 500 mL via INTRAVENOUS

## 2020-01-31 MED ORDER — SODIUM CHLORIDE 0.9 % IV SOLN: INTRAVENOUS | Status: AC

## 2020-01-31 MED ORDER — INSULIN ASPART 100 UNIT/ML ~~LOC~~ SOLN
0.0000 [IU] | Freq: Three times a day (TID) | SUBCUTANEOUS | Status: DC
  Administered 2020-01-31 (×3): 3 [IU] via SUBCUTANEOUS
  Administered 2020-02-01 – 2020-02-02 (×4): 2 [IU] via SUBCUTANEOUS
  Administered 2020-02-03: 5 [IU] via SUBCUTANEOUS
  Administered 2020-02-03 – 2020-02-05 (×3): 2 [IU] via SUBCUTANEOUS

## 2020-01-31 NOTE — ED Notes (Signed)
Pt now acutely anxious and trembling. Afebrile to 97.9. Pt reports she just started feeling this way. Pt also now w/ audible wheezing in all fields, increased O2 require of 5LPM Ava. Tachypneic to the 40s. Pt states "I don't want to die". Reassurance provided. CCM paged and made aware of acute change in status.

## 2020-01-31 NOTE — Progress Notes (Signed)
Assisted tele visit to patient with family member.  Seriyah Collison P, RN  

## 2020-01-31 NOTE — Progress Notes (Signed)
PHARMACY - PHYSICIAN COMMUNICATION CRITICAL VALUE ALERT - BLOOD CULTURE IDENTIFICATION (BCID)  Results for orders placed or performed during the hospital encounter of 01/30/20  Blood Culture ID Panel (Reflexed) (Collected: 01/31/2020 12:22 AM)  Result Value Ref Range   Enterococcus species NOT DETECTED NOT DETECTED   Listeria monocytogenes NOT DETECTED NOT DETECTED   Staphylococcus species NOT DETECTED NOT DETECTED   Staphylococcus aureus (BCID) NOT DETECTED NOT DETECTED   Streptococcus species NOT DETECTED NOT DETECTED   Streptococcus agalactiae NOT DETECTED NOT DETECTED   Streptococcus pneumoniae NOT DETECTED NOT DETECTED   Streptococcus pyogenes NOT DETECTED NOT DETECTED   Acinetobacter baumannii NOT DETECTED NOT DETECTED   Enterobacteriaceae species DETECTED (A) NOT DETECTED   Enterobacter cloacae complex NOT DETECTED NOT DETECTED   Escherichia coli DETECTED (A) NOT DETECTED   Klebsiella oxytoca NOT DETECTED NOT DETECTED   Klebsiella pneumoniae NOT DETECTED NOT DETECTED   Proteus species NOT DETECTED NOT DETECTED   Serratia marcescens NOT DETECTED NOT DETECTED   Carbapenem resistance NOT DETECTED NOT DETECTED   Haemophilus influenzae NOT DETECTED NOT DETECTED   Neisseria meningitidis NOT DETECTED NOT DETECTED   Pseudomonas aeruginosa NOT DETECTED NOT DETECTED   Candida albicans NOT DETECTED NOT DETECTED   Candida glabrata NOT DETECTED NOT DETECTED   Candida krusei NOT DETECTED NOT DETECTED   Candida parapsilosis NOT DETECTED NOT DETECTED   Candida tropicalis NOT DETECTED NOT DETECTED    Name of physician (or Provider) Contacted: none   Changes to prescribed antibiotics required: No changes needed Currently on [place].D. CPP, BCPS Clinical Pharmacist [phone] 01/31/2020 4:28 PM

## 2020-01-31 NOTE — H&P (Signed)
History and Physical    [name] [id number] DOB: [birth year] DOA: 01/30/2020  PCP: Binnie Rail, MD  Patient coming from: Home  Chief Complaint:  Chief Complaint  Patient presents with  . Fever     HPI:    79 year old female with past medical history of diabetes mellitus type 2, hypertension, chronic kidney disease stage III, diabetic peripheral neuropathy and remote history of breast cancer who presents to [facility] emergency department with a several day history of progressively worsening weakness and fever.  Patient explains that approximately 3 days ago she began to develop dysuria.  As the patient's dysuria persisted over the next several days patient developed progressively worsening generalized weakness and poor appetite.  Patient's generalized weakness was mild but became severe as the days progressed.  Weakness is worse with attempts to rise from a seated position or attempts to ambulate.  Patient states that over the same span of time she has been unable to tolerate oral or solid intake.  As patient symptoms continue to worsen she also began to experience intense lightheadedness upon attempts to rise from a seated position.  Upon further questioning patient denies sick contacts, recent travel, hematuria, low back pain, diarrhea, shortness of breath, cough or confirmed contact with COVID-19.  Patient symptoms of generalized weakness poor oral intake and lightheadedness continue to worsen to the patient eventually presented to [facility] emergency department for evaluation.  Upon evaluation in the emergency department patient was found to have multiple SIRS criteria as well as lactic acidosis concerning for sepsis secondary to urinary tract infection considering abnormal urinalysis.  Patient was provided with a dose of 1 g of intravenous ceftriaxone.  Patient was also hydrated with 2 L of intravenous isotonic normal saline.  The hospitalist group was  then called to assess the patient for admission the hospital.     Review of Systems: A 10-system review of systems has been performed and all systems are negative with the exception of what is listed in the HPI.   Past Medical History:  Diagnosis Date  . Breast cancer ([place]) 2012  . Diabetes mellitus   . Family history of breast cancer   . Family history of ovarian cancer   . GERD (gastroesophageal reflux disease)   . Gilbert syndrome   . Hypertension   . IBS (irritable bowel syndrome)   . Plantar fasciitis of right foot    Dr Oneta Rack  . Transfusion history 1976    Past Surgical History:  Procedure Laterality Date  . ABDOMINAL HYSTERECTOMY  1976   with BSO due to infection from [facility] IUD  . APPENDECTOMY  1976   @ [place]     [id number]  . CATARACT EXTRACTION, BILATERAL Bilateral 2018  . COLONOSCOPY W/ POLYPECTOMY  2006   negative 2011; Dr Carlean Purl  . MASTECTOMY W/ NODES PARTIAL  2012   double mastectomy with nodes taken out on right side   . PLACEMENT OF BREAST IMPLANTS  04/2011   Dr Migdalia Dk, [facility]  . TONSILLECTOMY AND ADENOIDECTOMY       reports that she quit smoking about 42 years ago. She has never used smokeless tobacco. She reports that she does not drink alcohol or use drugs.  Allergies  Allergen Reactions  . Sulfur     Flushed, funny feeling in throat   . Exemestane Nausea Only    Other reaction(s): Dizziness (intolerance)  . Morphine And Related   .  Statins Other (See Comments)    Elevated LFTs  . Ace Inhibitors     REACTION: COUGH  . Codeine     REACTION: VOMITTING    Family History  Problem Relation Age of Onset  . Heart attack Father 58  . Breast cancer Sister 47        bilateral   . Heart failure Sister        PMH intensive chemotherapy  . Hypertension Sister   . Stroke Sister 69  . Stroke Mother        TMI  . Breast cancer Maternal Aunt 55  . Diabetes Maternal Grandmother   . Ovarian  cancer Paternal Grandmother   . Breast cancer Maternal Aunt 77     Prior to Admission medications   Medication Sig Start Date End Date Taking? Authorizing Provider  carbidopa-levodopa (SINEMET IR) 25-100 MG tablet TAKE 1 TABLET BY MOUTH 3 TIMES A DAY. Patient taking differently: Take 1 tablet by mouth 3 (three) times daily.  01/16/19  Yes Jaffe, Adam R, DO  cloNIDine (CATAPRES) 0.2 MG tablet TAKE 1 TABLET BY MOUTH TWICE DAILY. Patient taking differently: Take 0.2 mg by mouth 2 (two) times daily.  10/20/19  Yes Burns, Claudina Lick, MD  Cyanocobalamin (VITAMIN B-12) 5000 MCG SUBL Place 5,000 mcg under the tongue daily.    Yes [provider]  losartan-hydrochlorothiazide (HYZAAR) 100-12.5 MG tablet TAKE 1 TABLET ONCE DAILY. Patient taking differently: Take 1 tablet by mouth daily.  11/11/19  Yes Burns, Claudina Lick, MD  metFORMIN (GLUCOPHAGE) 500 MG tablet TAKE 2 TABLETS TWICE A DAY WITH MEALS. Patient taking differently: Take 1,000-1,500 mg by mouth See admin instructions. Take 3 tablets every morning and take 2 tablets every evening 10/08/19  Yes Burns, Claudina Lick, MD  Multiple Vitamin (MULTIVITAMIN WITH MINERALS) TABS tablet Take 1 tablet by mouth daily.   Yes [provider]  Omega-3 Fatty Acids (FISH OIL CONCENTRATE) 1000 MG CAPS Take 1,000 mg by mouth 2 (two) times daily.    Yes [provider]    Physical Exam: Vitals:   01/31/20 0345 01/31/20 0400 01/31/20 0415 01/31/20 0430  BP: (!) 125/54 (!) 134/51 (!) 131/57 128/60  Pulse: (!) 119 (!) 120 (!) 116 (!) 120  Resp: (!) 36 (!) 36 (!) 35 (!) 36  Temp:      TempSrc:      SpO2: 94% 94% 95% 95%  Weight:      Height:        Constitutional: Patient is lethargic but arousable and oriented x3.  Patient is not in any acute distress. Skin: no rashes, no lesions, notable poor skin turgor. Eyes: Pupils are equally reactive to light.  No evidence of scleral icterus or conjunctival pallor.  ENMT: Extremely dry mucous membranes  noted.  Posterior pharynx clear of any exudate or lesions.   Neck: normal, supple, no masses, no thyromegaly.  No evidence of jugular venous distension.   Respiratory: clear to auscultation bilaterally, no wheezing, no crackles. Normal respiratory effort. No accessory muscle use.  Cardiovascular: Tachycardic rate but regular rhythm no murmurs / rubs / gallops. No extremity edema. 2+ pedal pulses. No carotid bruits.  Chest:   Nontender without crepitus or deformity.   Back:   Nontender without crepitus or deformity. Abdomen: Abdomen is soft and nontender.  No evidence of intra-abdominal masses.  Positive bowel sounds noted in all quadrants.   Musculoskeletal: No joint deformity upper and lower extremities. Good ROM, no contractures. Normal muscle tone.  Neurologic: Patient is lethargic but arousable and oriented x3.  Patient is moving all 4 extremities spontaneously.  Sensation is grossly intact.  Patient is responsive to verbal and painful stimuli.  Patient is following all commands. Psychiatric: Patient presents as with a depressed mood and flat affect.  Patient seems to possess insight as to theircurrent situation.     Labs on Admission: I have personally reviewed following labs and imaging studies -   CBC: Recent Labs  Lab 01/31/20 0007  WBC 14.5*  NEUTROABS 12.0*  HGB 12.5  HCT 37.4  MCV 87.6  PLT 0000000   Basic Metabolic Panel: Recent Labs  Lab 01/31/20 0007  NA 136  K 2.7*  CL 101  CO2 21*  GLUCOSE 221*  BUN 23  CREATININE 1.81*  CALCIUM 8.4*   GFR: Estimated Creatinine Clearance: 25.2 mL/min (A) (by C-G formula based on SCr of 1.81 mg/dL (H)). Liver Function Tests: Recent Labs  Lab 01/31/20 0007  AST 32  ALT 28  ALKPHOS 45  BILITOT 2.4*  PROT 6.3*  ALBUMIN 2.8*   No results for input(s): LIPASE, AMYLASE in the last 168 hours. No results for input(s): AMMONIA in the last 168 hours. Coagulation Profile: Recent Labs  Lab 01/31/20 0007  INR 1.3*   Cardiac  Enzymes: No results for input(s): CKTOTAL, CKMB, CKMBINDEX, TROPONINI in the last 168 hours. BNP (last 3 results) No results for input(s): PROBNP in the last 8760 hours. HbA1C: No results for input(s): HGBA1C in the last 72 hours. CBG: No results for input(s): GLUCAP in the last 168 hours. Lipid Profile: No results for input(s): CHOL, HDL, LDLCALC, TRIG, CHOLHDL, LDLDIRECT in the last 72 hours. Thyroid Function Tests: No results for input(s): TSH, T4TOTAL, FREET4, T3FREE, THYROIDAB in the last 72 hours. Anemia Panel: No results for input(s): VITAMINB12, FOLATE, FERRITIN, TIBC, IRON, RETICCTPCT in the last 72 hours. Urine analysis:    Component Value Date/Time   COLORURINE YELLOW 01/31/2020 0151   APPEARANCEUR CLOUDY (A) 01/31/2020 0151   LABSPEC 1.011 01/31/2020 0151   PHURINE 5.0 01/31/2020 0151   GLUCOSEU NEGATIVE 01/31/2020 0151   HGBUR MODERATE (A) 01/31/2020 0151   HGBUR negative 11/17/2010 1626   BILIRUBINUR NEGATIVE 01/31/2020 0151   KETONESUR 5 (A) 01/31/2020 0151   PROTEINUR 100 (A) 01/31/2020 0151   UROBILINOGEN negative 11/17/2010 1626   NITRITE NEGATIVE 01/31/2020 0151   LEUKOCYTESUR LARGE (A) 01/31/2020 0151    Radiological Exams on Admission - Personally Reviewed: DG Chest Port 1 View  Result Date: 01/31/2020 CLINICAL DATA:  New atrial flutter. Fever and syncope. EXAM: PORTABLE CHEST 1 VIEW COMPARISON:  Chest x-rays dated 01/31/2020 and 10/11/2010. FINDINGS: Heart size and mediastinal contours are within normal limits. Lungs are clear. No pleural effusion or pneumothorax is seen. Osseous structures about the chest are unremarkable. IMPRESSION: No active disease. No evidence of pneumonia or pulmonary edema. Electronically Signed   By: Franki Cabot M.D.   On: 01/31/2020 04:26   DG Chest Port 1 View  Result Date: 01/31/2020 CLINICAL DATA:  Sepsis EXAM: PORTABLE CHEST 1 VIEW COMPARISON:  None. FINDINGS: The heart size and mediastinal contours are within normal  limits. Both lungs are clear. The visualized skeletal structures are unremarkable. IMPRESSION: No active disease. Electronically Signed   By: Prudencio Pair M.D.   On: 01/31/2020 00:39    EKG: Personally reviewed.  Rhythm is sinus tachycardia with heart rate of 142 bpm.  No dynamic ST segment changes appreciated.  I am in disagreement with the  computer interpretation stating atrial fibrillation.  Assessment/Plan Principal Problem:   Sepsis due to gram-negative bacteria (HCC)   With multiple sirs criteria, urinalysis suggestive of urinary tract infection, lactic acidosis and acute kidney injury superimposed on chronic kidney disease  This is all suggestive of sepsis, likely secondary to urinary tract infection and likely gram-negative bacteria  Hydrating patient aggressively with intravenous isotonic fluids  Treating patient with broad-spectrum intravenous antibiotic therapy with Zosyn  Blood and urine cultures have been obtained  Despite benign abdomen on examination obtaining CT imaging of the abdomen and pelvis due to severity of patient's presentation  Admitting the patient to stepdown unit  Patient is at extremely high risk of rapid clinical decompensation and must be monitored closely.  Active Problems:   Lactic acidosis   Hydrating patient aggressively with intravenous isotonic fluids  Concurrently treating underlying infection with intravenous antibiotics  Performing serial lactic acid levels to ensure downtrending and resolution    Complicated UTI (urinary tract infection)   Please see assessment and plan above    Acute renal failure superimposed on stage 3 chronic kidney disease ([place])   Acute kidney injury with rise in creatinine to 1.81 from baseline of 1.15.  This is secondary to volume depletion and sepsis  Hydrating patient with intravenous isotonic fluids.  CT imaging of abdomen and pelvis to ensure there is no evidence of retained infected stone or  pyelonephritis  Strict input and output monitoring  Monitoring renal function and electrolytes with serial chemistry    Type 2 diabetes mellitus with stage 3 chronic kidney disease, without long-term current use of insulin (HCC)   Accu-Cheks before every meal and nightly with sliding scale insulin  Obtaining hemoglobin A1c  Holding home regimen of oral hypoglycemics    Essential hypertension   Holding home regimen of losartan due to acute kidney injury  Holding home regimen of hydrochlorothiazide due to volume depletion related to sepsis  We will restart antihypertensives as clinically able    Hypokalemia due to inadequate potassium intake   Substantial hypokalemia secondary to poor oral intake  Replacing with both oral and intravenous means.  Monitoring potassium levels with serial chemistries    Parkinsonian features   Patient has been treated with Sinemet 3 times daily for parkinsonian features (not parkinson disease)  by Dr. Tomi Likens   Code Status:  Full code Family Communication: Deferred  Status is: Inpatient  Remains inpatient appropriate because:Persistent severe electrolyte disturbances, Ongoing diagnostic testing needed not appropriate for outpatient work up, IV treatments appropriate due to intensity of illness or inability to take PO and Inpatient level of care appropriate due to severity of illness   Dispo: The patient is from: Home              Anticipated d/c is to: Home              Anticipated d/c date is: > 3 days              Patient currently is not medically stable to d/c.         Vernelle Emerald MD [facility] Pager [phone]  If 7PM-7AM, please contact night-coverage [email or web address] Use universal [place] password for that web site. If you do not have the password, please call the hospital operator.  01/31/2020, 4:47 AM

## 2020-01-31 NOTE — ED Notes (Signed)
Pt to ct 

## 2020-01-31 NOTE — ED Notes (Signed)
Breakfast Ordered 

## 2020-01-31 NOTE — ED Notes (Signed)
Pt assisted to bedside commode. Produced approx 273ml of urine. Sample sent.  Pt able to tolerate sitting and standing with assistance.

## 2020-01-31 NOTE — ED Provider Notes (Signed)
[facility] Emergency Department Provider Note MRN:  [id number]  Arrival date & time: 01/31/20     Chief Complaint   Fever   History of Present Illness   [name] is a 79 y.o. year-old female with a history of breast cancer presenting to the ED with chief complaint of fever.  Fever and malaise today.  3 falls today related to syncopal episodes.  Patient noting burning with urination today.  Denies cough, shortness of breath with standing or walking, no chest pain, no abdominal pain.  No vomiting or diarrhea.  Review of Systems  A complete 10 system review of systems was obtained and all systems are negative except as noted in the HPI and PMH.   Patient's Health History    Past Medical History:  Diagnosis Date  . Breast cancer ([place]) 2012  . Diabetes mellitus   . Family history of breast cancer   . Family history of ovarian cancer   . GERD (gastroesophageal reflux disease)   . Gilbert syndrome   . Hypertension   . IBS (irritable bowel syndrome)   . Plantar fasciitis of right foot    Dr Oneta Rack  . Transfusion history 1976    Past Surgical History:  Procedure Laterality Date  . ABDOMINAL HYSTERECTOMY  1976   with BSO due to infection from [facility] IUD  . APPENDECTOMY  1976   @ [place]     [id number], [phone]  . CATARACT EXTRACTION, BILATERAL Bilateral 2018  . COLONOSCOPY W/ POLYPECTOMY  2006   negative 2011; Dr Carlean Purl  . MASTECTOMY W/ NODES PARTIAL  2012   double mastectomy with nodes taken out on right side   . PLACEMENT OF BREAST IMPLANTS  04/2011   Dr Migdalia Dk, [facility]  . TONSILLECTOMY AND ADENOIDECTOMY      Family History  Problem Relation Age of Onset  . Heart attack Father 77  . Breast cancer Sister 49        bilateral   . Heart failure Sister        PMH intensive chemotherapy  . Hypertension Sister   . Stroke Sister 83  . Stroke Mother        TMI  . Breast cancer Maternal Aunt 30    . Diabetes Maternal Grandmother   . Ovarian cancer Paternal Grandmother   . Breast cancer Maternal Aunt 77    Social History   Socioeconomic History  . Marital status: Married    Spouse name: Not on file  . Number of children: 4  . Years of education: Not on file  . Highest education level: Not on file  Occupational History  . Occupation: retired  Tobacco Use  . Smoking status: Former Smoker    Quit date: 09/18/1977    Years since quitting: 42.3  . Smokeless tobacco: Never Used  . Tobacco comment: smoked 1957-1979 , up to 1 ppd  Substance and Sexual Activity  . Alcohol use: No    Comment:  very rarely  . Drug use: No  . Sexual activity: Not Currently    Birth control/protection: Surgical  Other Topics Concern  . Not on file  Social History Narrative   Walking for exercise   Social Determinants of Health   Financial Resource Strain:   . Difficulty of Paying Living Expenses:   Food Insecurity:   . Worried About Charity fundraiser in the Last Year:   . [place] in the  Last Year:   Transportation Needs:   . Film/video editor (Medical):   Marland Kitchen Lack of Transportation (Non-Medical):   Physical Activity:   . Days of Exercise per Week:   . Minutes of Exercise per Session:   Stress:   . Feeling of Stress :   Social Connections:   . Frequency of Communication with Friends and Family:   . Frequency of Social Gatherings with Friends and Family:   . Attends Religious Services:   . Active Member of Clubs or Organizations:   . Attends Archivist Meetings:   Marland Kitchen Marital Status:   Intimate Partner Violence:   . Fear of Current or Ex-Partner:   . Emotionally Abused:   Marland Kitchen Physically Abused:   . Sexually Abused:      Physical Exam   Vitals:   01/31/20 0515 01/31/20 0545  BP: (!) 125/53 123/61  Pulse: (!) 117 (!) 57  Resp: (!) 35 (!) 34  Temp:    SpO2: 96% 96%    CONSTITUTIONAL: Well-appearing, NAD NEURO:  Alert and oriented x 3, no focal  deficits EYES:  eyes equal and reactive ENT/NECK:  no LAD, no JVD CARDIO: Tachycardic rate, well-perfused, normal S1 and S2 PULM:  CTAB no wheezing or rhonchi GI/GU:  normal bowel sounds, non-distended, non-tender MSK/SPINE:  No gross deformities, no edema SKIN:  no rash, atraumatic PSYCH:  Appropriate speech and behavior  *Additional and/or pertinent findings included in MDM below  Diagnostic and Interventional Summary    EKG Interpretation  Date/Time:  Friday Jan 30 2020 23:42:10 EDT Ventricular Rate:  108 PR Interval:    QRS Duration: 130 QT Interval:  378 QTC Calculation: 507 R Axis:   -47 Text Interpretation: Sinus tachycardia Left bundle branch block No previous ECGs available Confirmed by Gerlene Fee [phone]) on 01/31/2020 12:10:04 AM      Labs Reviewed  LACTIC ACID, PLASMA - Abnormal; Notable for the following components:      Result Value   Lactic Acid, Venous 2.3 (*)    All other components within normal limits  LACTIC ACID, PLASMA - Abnormal; Notable for the following components:   Lactic Acid, Venous 2.0 (*)    All other components within normal limits  COMPREHENSIVE METABOLIC PANEL - Abnormal; Notable for the following components:   Potassium 2.7 (*)    CO2 21 (*)    Glucose, Bld 221 (*)    Creatinine, Ser 1.81 (*)    Calcium 8.4 (*)    Total Protein 6.3 (*)    Albumin 2.8 (*)    Total Bilirubin 2.4 (*)    GFR calc non Af Amer 26 (*)    GFR calc Af Amer 30 (*)    All other components within normal limits  CBC WITH DIFFERENTIAL/PLATELET - Abnormal; Notable for the following components:   WBC 14.5 (*)    Neutro Abs 12.0 (*)    Lymphs Abs 0.6 (*)    Monocytes Absolute 1.7 (*)    Abs Immature Granulocytes 0.10 (*)    All other components within normal limits  APTT - Abnormal; Notable for the following components:   aPTT 38 (*)    All other components within normal limits  PROTIME-INR - Abnormal; Notable for the following components:   Prothrombin  Time 15.8 (*)    INR 1.3 (*)    All other components within normal limits  URINALYSIS, ROUTINE W REFLEX MICROSCOPIC - Abnormal; Notable for the following components:   APPearance CLOUDY (*)  Hgb urine dipstick MODERATE (*)    Ketones, ur 5 (*)    Protein, ur 100 (*)    Leukocytes,Ua LARGE (*)    WBC, UA >50 (*)    Bacteria, UA RARE (*)    All other components within normal limits  HEMOGLOBIN A1C - Abnormal; Notable for the following components:   Hgb A1c MFr Bld 6.5 (*)    All other components within normal limits  PROTIME-INR - Abnormal; Notable for the following components:   Prothrombin Time 16.2 (*)    INR 1.4 (*)    All other components within normal limits  CORTISOL-AM, BLOOD - Abnormal; Notable for the following components:   Cortisol - AM 45.8 (*)    All other components within normal limits  SARS CORONAVIRUS 2 BY RT PCR (HOSPITAL ORDER, [facility])  CULTURE, BLOOD (ROUTINE X 2)  CULTURE, BLOOD (ROUTINE X 2)  URINE CULTURE  PROCALCITONIN  LACTIC ACID, PLASMA  COMPREHENSIVE METABOLIC PANEL  CBC WITH DIFFERENTIAL/PLATELET    CT ABDOMEN PELVIS WO CONTRAST  Final Result    DG Chest Port 1 View  Final Result    DG Chest Port 1 View  Final Result      Medications  sodium chloride 0.9 % bolus 1,000 mL (1,000 mLs Intravenous New Bag/Given 01/31/20 0503)    Followed by  0.9 %  sodium chloride infusion (has no administration in time range)  insulin aspart (novoLOG) injection 0-15 Units (has no administration in time range)  cloNIDine (CATAPRES) tablet 0.2 mg (has no administration in time range)  carbidopa-levodopa (SINEMET IR) 25-100 MG per tablet immediate release 1 tablet (has no administration in time range)  enoxaparin (LOVENOX) injection 30 mg (has no administration in time range)  acetaminophen (TYLENOL) tablet 650 mg (has no administration in time range)    Or  acetaminophen (TYLENOL) suppository 650 mg (has no administration in  time range)  ondansetron (ZOFRAN) tablet 4 mg (has no administration in time range)    Or  ondansetron (ZOFRAN) injection 4 mg (has no administration in time range)  polyethylene glycol (MIRALAX / GLYCOLAX) packet 17 g (has no administration in time range)  piperacillin-tazobactam (ZOSYN) IVPB 3.375 g (3.375 g Intravenous New Bag/Given 01/31/20 0548)  cefTRIAXone (ROCEPHIN) 1 g in sodium chloride 0.9 % 100 mL IVPB (0 g Intravenous Stopped 01/31/20 0105)  sodium chloride 0.9 % bolus 1,000 mL (0 mLs Intravenous Stopped 01/31/20 0146)  sodium chloride 0.9 % bolus 1,000 mL (0 mLs Intravenous Stopped 01/31/20 0146)  magnesium sulfate IVPB 2 g 50 mL (0 g Intravenous Stopped 01/31/20 0321)  potassium chloride SA (KLOR-CON) CR tablet 40 mEq (40 mEq Oral Given 01/31/20 0210)  potassium chloride 10 mEq in 100 mL IVPB (10 mEq Intravenous New Bag/Given 01/31/20 0445)  acetaminophen (TYLENOL) tablet 1,000 mg (1,000 mg Oral Given 01/31/20 0329)     Procedures  /  Critical Care .Critical Care Performed by: Maudie Flakes, MD Authorized by: Maudie Flakes, MD   Critical care provider statement:    Critical care time (minutes):  45   Critical care was necessary to treat or prevent imminent or life-threatening deterioration of the following conditions:  Sepsis   Critical care was time spent personally by me on the following activities:  Discussions with consultants, evaluation of patient's response to treatment, examination of patient, ordering and performing treatments and interventions, ordering and review of laboratory studies, ordering and review of radiographic studies, pulse oximetry, re-evaluation of patient's  condition, obtaining history from patient or surrogate and review of old charts .1-3 Lead EKG Interpretation Performed by: Maudie Flakes, MD Authorized by: Maudie Flakes, MD     Interpretation: abnormal     ECG rate assessment: tachycardic     Rhythm: sinus rhythm     Ectopy: PAC   Comments:      Cardiac monitoring was ordered to monitor the patient for dysrhythmia.  I personally interpreted the patient's cardiac monitor while at the bedside.     ED Course and Medical Decision Making  I have reviewed the triage vital signs, the nursing notes, and pertinent available records from the EMR.  Listed above are laboratory and imaging tests that I personally ordered, reviewed, and interpreted and then considered in my medical decision making (see below for details).      Fever, tachycardia, multiple syncopal episodes, concern for sepsis and hypovolemia.  Was orthostatic with EMS.  Patient is in no acute distress, seems to be of sound mind on my exam, alert and oriented, no focal neurological deficits, benign abdomen.  Likely source urine given dysuria earlier today.  Code sepsis initiated, providing fluids, ceftriaxone, work-up pending.  Labs reveal hypokalemia, AKI, urinalysis consistent with infection.  Will admit for urosepsis with metabolic disturbance.  After acceptance for admission by hospitalist service, was made aware that patient has had a clinical change, seems to be demonstrating an atrial flutter with heart rate in the 140s to 150s.  Is mildly tachypneic with rigors on exam.  Providing Tylenol, repeat x-ray of the chest is reassuring, providing third liter of IV fluids.  Barth Kirks. Sedonia Small, [place] [email or web address]  Final Clinical Impressions(s) / ED Diagnoses     ICD-10-CM   1. Sepsis, due to unspecified organism, unspecified whether acute organ dysfunction present [facility])  A41.9     ED Discharge Orders    None       Discharge Instructions Discussed with and Provided to Patient:   Discharge Instructions   None       Maudie Flakes, MD 01/31/20 [phone]

## 2020-01-31 NOTE — Progress Notes (Signed)
Pharmacy Antibiotic Note  [name] is a 79 y.o. female admitted on 01/30/2020 with sepsis.  Pharmacy has been consulted for zosyn dosing.  Plan: Zosyn 3.375g IV q8h (4 hour infusion).  F/u renal function, cultures and clinical course  Height: 5\' 4"  (162.6 cm) Weight: 73.5 kg (162 lb) IBW/kg (Calculated) : 54.7  Temp (24hrs), Avg:99.8 F (37.7 C), Min:98.5 F (36.9 C), Max:102.5 F (39.2 C)  Recent Labs  Lab 01/31/20 0007 01/31/20 0259  WBC 14.5*  --   CREATININE 1.81*  --   LATICACIDVEN 2.3* 2.0*    Estimated Creatinine Clearance: 25.2 mL/min (A) (by C-G formula based on SCr of 1.81 mg/dL (H)).    Allergies  Allergen Reactions  . Sulfur     Flushed, funny feeling in throat   . Exemestane Nausea Only    Other reaction(s): Dizziness (intolerance)  . Morphine And Related   . Statins Other (See Comments)    Elevated LFTs  . Ace Inhibitors     REACTION: COUGH  . Codeine     REACTION: VOMITTING     Thank you for allowing pharmacy to be a part of this patient's care.  Excell Seltzer Poteet 01/31/2020 5:02 AM

## 2020-01-31 NOTE — Consult Note (Signed)
NAME:  [name], MRN:  [id number], DOB:  May 19, 1941, LOS: 0 ADMISSION DATE:  01/30/2020, CONSULTATION DATE:  01/31/2020 REFERRING MD:  Dr. Wynelle Cleveland, [facility], CHIEF COMPLAINT:  fever  Brief History   79 yo female presented to ER with fever, syncope, dysuria x 3 days.  Found to have UTI.  Developed hypotension and transient A fib in ER and PCCM asked to assess.  Past Medical History  DM, HTN, CKD 3a, DM neuropathy, Breast cancer, IBS, Gilbert syndrome, GERD,   Significant Hospital Events   5/14 Admit  Consults:    Procedures:    Significant Diagnostic Tests:  CT abd/pelvis 5/15 >> changes of cirrhosis, no hydronephrosis  Micro Data:  SARS CoV2 PCR 5/15 >> negative Urine 5/15 >> Blood 5/15 >>   Antimicrobials:  Rocephin 5/14  Zosyn 5/14 >>  Interim history/subjective:    Objective   Blood pressure (!) 75/40, pulse 78, temperature 98.1 F (36.7 C), temperature source Oral, resp. rate (!) 30, height 5\' 4"  (1.626 m), weight 73.5 kg, SpO2 99 %.        Intake/Output Summary (Last 24 hours) at 01/31/2020 1215 Last data filed at 01/31/2020 0750 Gross per 24 hour  Intake 1000 ml  Output --  Net 1000 ml   Filed Weights   01/31/20 0000  Weight: 73.5 kg    Examination:  General - alert Eyes - pupils reactive ENT - no sinus tenderness, no stridor Cardiac - regular rate/rhythm, no murmur Chest - equal breath sounds b/l, no wheezing or rales Abdomen - soft, non tender, + bowel sounds Extremities - no cyanosis, clubbing, or edema Skin - no rashes Neuro - normal strength, moves extremities, follows commands Psych - normal mood and behavior Lymphatics - no lymphadenopathy   Resolved Hospital Problem list     Assessment & Plan:   Septic shock from UTI. - admit to ICU - day 2 of Abx, currently on zosyn - norepinephrine through peripheral IV - LR 1 liter bolus now - LR at 100 ml/hr  AKI from hypovolemia and sepsis. Hypokalemia. Lactic acidosis. -  baseline creatinine 1.15 from 08/27/19  Transient A fib. Hx of HTN. - check cardiac enzymes, Echo - hold outpt catapres, losartan, HCTZ  DM type II poorly controlled with hyperglycemia. DM neuropathy. - SSI - hold outpt metformin  Elevated bilirubin. - hx of Gilbert syndrome  Parkinson features. - followed by Dr. Tomi Likens with [place] [facility] - continue sinemet  Best practice:  Diet: heart healthy, carb modified DVT prophylaxis: lovenox GI prophylaxis: not indicated Mobility: bed rest Code Status: full code Disposition: ICU  Labs   CBC: Recent Labs  Lab 01/31/20 0007 01/31/20 0506  WBC 14.5* 9.1  NEUTROABS 12.0* 7.9*  HGB 12.5 11.4*  HCT 37.4 34.3*  MCV 87.6 88.6  PLT 176 [id number]    Basic Metabolic Panel: Recent Labs  Lab 01/31/20 0007 01/31/20 0506  NA 136 142  K 2.7* 3.5  CL 101 109  CO2 21* 19*  GLUCOSE 221* 153*  BUN 23 22  CREATININE 1.81* 1.60*  CALCIUM 8.4* 7.7*   GFR: Estimated Creatinine Clearance: 28.5 mL/min (A) (by C-G formula based on SCr of 1.6 mg/dL (H)). Recent Labs  Lab 01/31/20 0007 01/31/20 0259 01/31/20 0506 01/31/20 0556  PROCALCITON  --   --  12.36  --   WBC 14.5*  --  9.1  --   LATICACIDVEN 2.3* 2.0*  --  2.4*    Liver Function Tests: Recent Labs  Lab 01/31/20  0007 01/31/20 0506  AST 32 32  ALT 28 26  ALKPHOS 45 65  BILITOT 2.4* 1.9*  PROT 6.3* 5.5*  ALBUMIN 2.8* 2.3*   No results for input(s): LIPASE, AMYLASE in the last 168 hours. No results for input(s): AMMONIA in the last 168 hours.  ABG No results found for: PHART, PCO2ART, PO2ART, HCO3, TCO2, ACIDBASEDEF, O2SAT   Coagulation Profile: Recent Labs  Lab 01/31/20 0007 01/31/20 0506  INR 1.3* 1.4*    Cardiac Enzymes: No results for input(s): CKTOTAL, CKMB, CKMBINDEX, TROPONINI in the last 168 hours.  HbA1C: Hgb A1c MFr Bld  Date/Time Value Ref Range Status  01/31/2020 05:57 AM 6.5 (H) 4.8 - 5.6 % Final    Comment:    (NOTE) Pre diabetes:           5.7%-6.4% Diabetes:              >6.4% Glycemic control for   <7.0% adults with diabetes   08/27/2019 09:10 AM 6.6 (H) 4.6 - 6.5 % Final    Comment:    Glycemic Control Guidelines for People with Diabetes:Non Diabetic:  <6%Goal of Therapy: <7%Additional Action Suggested:  >8%     CBG: Recent Labs  Lab 01/31/20 0745 01/31/20 1143  GLUCAP 158* 162*    Review of Systems:   Reviewed and negative  Past Medical History  She,  has a past medical history of Breast cancer ([place]) (2012), Diabetes mellitus, Family history of breast cancer, Family history of ovarian cancer, GERD (gastroesophageal reflux disease), Gilbert syndrome, Hypertension, IBS (irritable bowel syndrome), Plantar fasciitis of right foot, and Transfusion history (1976).   Surgical History    Past Surgical History:  Procedure Laterality Date  . ABDOMINAL HYSTERECTOMY  1976   with BSO due to infection from [facility] IUD  . APPENDECTOMY  1976   @ [place]     [id number], [phone]  . CATARACT EXTRACTION, BILATERAL Bilateral 2018  . COLONOSCOPY W/ POLYPECTOMY  2006   negative 2011; Dr Carlean Purl  . MASTECTOMY W/ NODES PARTIAL  2012   double mastectomy with nodes taken out on right side   . PLACEMENT OF BREAST IMPLANTS  04/2011   Dr Migdalia Dk, [facility]  . TONSILLECTOMY AND ADENOIDECTOMY       Social History   reports that she quit smoking about 42 years ago. She has never used smokeless tobacco. She reports that she does not drink alcohol or use drugs.   Family History   Her family history includes Breast cancer (age of onset: 43) in her maternal aunt; Breast cancer (age of onset: 41) in her sister; Breast cancer (age of onset: 16) in her maternal aunt; Diabetes in her maternal grandmother; Heart attack (age of onset: 7) in her father; Heart failure in her sister; Hypertension in her sister; Ovarian cancer in her paternal grandmother; Stroke in her mother; Stroke (age of onset: 63) in her  sister.   Allergies Allergies  Allergen Reactions  . Sulfur     Flushed, funny feeling in throat   . Exemestane Nausea Only    Other reaction(s): Dizziness (intolerance)  . Morphine And Related   . Statins Other (See Comments)    Elevated LFTs  . Ace Inhibitors     REACTION: COUGH  . Codeine     REACTION: VOMITTING     Home Medications  Prior to Admission medications   Medication Sig Start Date End Date Taking? Authorizing Provider  carbidopa-levodopa (SINEMET IR) 25-100  MG tablet TAKE 1 TABLET BY MOUTH 3 TIMES A DAY. Patient taking differently: Take 1 tablet by mouth 3 (three) times daily.  01/16/19  Yes Jaffe, Adam R, DO  cloNIDine (CATAPRES) 0.2 MG tablet TAKE 1 TABLET BY MOUTH TWICE DAILY. Patient taking differently: Take 0.2 mg by mouth 2 (two) times daily.  10/20/19  Yes Burns, Claudina Lick, MD  Cyanocobalamin (VITAMIN B-12) 5000 MCG SUBL Place 5,000 mcg under the tongue daily.    Yes [provider]  losartan-hydrochlorothiazide (HYZAAR) 100-12.5 MG tablet TAKE 1 TABLET ONCE DAILY. Patient taking differently: Take 1 tablet by mouth daily.  11/11/19  Yes Burns, Claudina Lick, MD  metFORMIN (GLUCOPHAGE) 500 MG tablet TAKE 2 TABLETS TWICE A DAY WITH MEALS. Patient taking differently: Take 1,000-1,500 mg by mouth See admin instructions. Take 3 tablets every morning and take 2 tablets every evening 10/08/19  Yes Burns, Claudina Lick, MD  Multiple Vitamin (MULTIVITAMIN WITH MINERALS) TABS tablet Take 1 tablet by mouth daily.   Yes [provider]  Omega-3 Fatty Acids (FISH OIL CONCENTRATE) 1000 MG CAPS Take 1,000 mg by mouth 2 (two) times daily.    Yes [provider]     Critical care time: 34 minutes   Chesley Mires, MD [place] Pager - [phone] 01/31/2020, 12:50 PM

## 2020-01-31 NOTE — Progress Notes (Signed)
  Echocardiogram 2D Echocardiogram has been performed.  [name] F 01/31/2020, 1:45 PM

## 2020-02-01 LAB — BASIC METABOLIC PANEL
Anion gap: 9 (ref 5–15)
BUN: 23 mg/dL (ref 8–23)
CO2: 20 mmol/L — ABNORMAL LOW (ref 22–32)
Calcium: 7.3 mg/dL — ABNORMAL LOW (ref 8.9–10.3)
Chloride: 113 mmol/L — ABNORMAL HIGH (ref 98–111)
Creatinine, Ser: 1.54 mg/dL — ABNORMAL HIGH (ref 0.44–1.00)
GFR calc Af Amer: 37 mL/min — ABNORMAL LOW (ref >60)
GFR calc non Af Amer: 32 mL/min — ABNORMAL LOW (ref >60)
Glucose, Bld: 124 mg/dL — ABNORMAL HIGH (ref 70–99)
Potassium: 3.7 mmol/L (ref 3.5–5.1)
Sodium: 142 mmol/L (ref 135–145)

## 2020-02-01 LAB — GLUCOSE, CAPILLARY
Glucose-Capillary: 113 mg/dL — ABNORMAL HIGH (ref 70–99)
Glucose-Capillary: 114 mg/dL — ABNORMAL HIGH (ref 70–99)
Glucose-Capillary: 121 mg/dL — ABNORMAL HIGH (ref 70–99)
Glucose-Capillary: 126 mg/dL — ABNORMAL HIGH (ref 70–99)
Glucose-Capillary: 141 mg/dL — ABNORMAL HIGH (ref 70–99)
Glucose-Capillary: 92 mg/dL (ref 70–99)

## 2020-02-01 LAB — MAGNESIUM: Magnesium: 1.7 mg/dL (ref 1.7–2.4)

## 2020-02-01 MED ORDER — SODIUM CHLORIDE 0.9 % IV SOLN
1.0000 g | INTRAVENOUS | Status: DC
  Filled 2020-02-01: qty 10

## 2020-02-01 MED ORDER — MAGNESIUM SULFATE 2 GM/50ML IV SOLN
2.0000 g | Freq: Once | INTRAVENOUS | Status: AC
  Administered 2020-02-01: 2 g via INTRAVENOUS
  Filled 2020-02-01: qty 50

## 2020-02-01 MED ORDER — SODIUM CHLORIDE 0.9 % IV SOLN
2.0000 g | INTRAVENOUS | Status: DC
  Administered 2020-02-01 – 2020-02-06 (×6): 2 g via INTRAVENOUS
  Filled 2020-02-01 (×6): qty 20

## 2020-02-01 MED ORDER — POTASSIUM CHLORIDE CRYS ER 20 MEQ PO TBCR
40.0000 meq | EXTENDED_RELEASE_TABLET | Freq: Once | ORAL | Status: AC
  Administered 2020-02-01: 40 meq via ORAL
  Filled 2020-02-01: qty 2

## 2020-02-01 NOTE — Progress Notes (Signed)
Transferred from 65M  In wheel chair.Awake alert and oriented x 4. Her MEW score is 2 due to her breathing 25-30/min, her saturation was 97% at room air and patient was able to speak without any shortness of breathing.Connect patient on continous pulse oximeter.

## 2020-02-01 NOTE — Consult Note (Signed)
NAME:  [name], MRN:  [id number], DOB:  Aug 28, 1941, LOS: 1 ADMISSION DATE:  01/30/2020, CONSULTATION DATE:  01/31/2020 REFERRING MD:  Dr. Wynelle [place], [facility], CHIEF COMPLAINT:  fever  Brief History   79 yo female presented to ER with fever, syncope, dysuria x 3 days.  Found to have UTI.  Developed hypotension and transient A fib in ER and PCCM asked to assess.  Past Medical History  DM, HTN, CKD 3a, DM neuropathy, Breast cancer, IBS, Gilbert syndrome, GERD,   Significant Hospital Events   5/14 Admit  Consults:  PCCM  Procedures:    Significant Diagnostic Tests:  CT abd/pelvis 5/15 >> changes of cirrhosis, no hydronephrosis  Micro Data:  SARS CoV2 PCR 5/15 >> negative Urine 5/15 >> Blood 5/15 >>   Antimicrobials:  Rocephin 5/14  Zosyn 5/14 >>  Interim history/subjective:  Off vasopressors overnight.  Denies abdominal or flank pain.  Mild dyspnea this morning.  Objective   Blood pressure (!) 141/69, pulse (!) 110, temperature 98.9 F (37.2 C), temperature source Oral, resp. rate 18, height 5\' 4"  (1.626 m), weight 73.5 kg, SpO2 100 %.        Intake/Output Summary (Last 24 hours) at 02/01/2020 0909 Last data filed at 02/01/2020 0800 Gross per 24 hour  Intake 3356.86 ml  Output 600 ml  Net 2756.86 ml   Filed Weights   01/31/20 0000  Weight: 73.5 kg    Examination:  General - alert Eyes - pupils reactive ENT -  no stridor Cardiac - regular rate/rhythm, no murmur Chest - equal breath sounds b/l, occasional crackles at bases.  Mild tachypnea. Abdomen - soft, non tender, + bowel sounds no CVA tenderness. Extremities - no cyanosis, clubbing, or edema Skin - no rashes Neuro - normal strength, moves extremities, follows commands Psych - normal mood and behavior Lymphatics - no lymphadenopathy   Resolved Hospital Problem list     Assessment & Plan:   Septic shock from pyelonephritis.  Now resolved. -Ready for transfer to medical ward. -Stop IV  fluids.  E. coli pyelonephritis -Stepdown to ceftriaxone.  Tachypnea with wheezing.  Possible volume overload and atelectasis. -Incentive spirometry and progressive ambulation. -Consider diuresis if symptoms persist.  AKI from hypovolemia and sepsis.  Improving. - baseline creatinine 1.15 from 08/27/19  Transient A fib.  Now in sinus rhythm.  Likely secondary to high adrenergic tone.  Only mild left atrial enlargement. Hx of HTN. -No need for rate control.  Would not anticoagulate. - hold outpt catapres, losartan, HCTZ plan to resume closer to discharge.  DM type II poorly controlled with hyperglycemia. DM neuropathy. - SSI - hold outpt metformin  Elevated bilirubin. - hx of Gilbert syndrome  Parkinson features. - followed by Dr. Tomi Likens with [facility] - continue sinemet  Best practice:  Diet: heart healthy, carb modified DVT prophylaxis: lovenox GI prophylaxis: not indicated Mobility: bed rest Code Status: full code Disposition: Ready for transfer to [facility] ward.  Order reconciliation performed.  Hospitalist contacted.  Labs   CBC: Recent Labs  Lab 01/31/20 0007 01/31/20 0506  WBC 14.5* 9.1  NEUTROABS 12.0* 7.9*  HGB 12.5 11.4*  HCT 37.4 34.3*  MCV 87.6 88.6  PLT 176 [id number]    Basic Metabolic Panel: Recent Labs  Lab 01/31/20 0007 01/31/20 0506 02/01/20 0227  NA 136 142 142  K 2.7* 3.5 3.7  CL 101 109 113*  CO2 21* 19* 20*  GLUCOSE 221* 153* 124*  BUN 23 22 23   CREATININE 1.81* 1.60*  1.54*  CALCIUM 8.4* 7.7* 7.3*  MG  --   --  1.7   GFR: Estimated Creatinine Clearance: 29.6 mL/min (A) (by C-G formula based on SCr of 1.54 mg/dL (H)). Recent Labs  Lab 01/31/20 0007 01/31/20 0259 01/31/20 0506 01/31/20 0556  PROCALCITON  --   --  12.36  --   WBC 14.5*  --  9.1  --   LATICACIDVEN 2.3* 2.0*  --  2.4*    Liver Function Tests: Recent Labs  Lab 01/31/20 0007 01/31/20 0506  AST 32 32  ALT 28 26  ALKPHOS 45 65  BILITOT 2.4* 1.9*  PROT  6.3* 5.5*  ALBUMIN 2.8* 2.3*   No results for input(s): LIPASE, AMYLASE in the last 168 hours. No results for input(s): AMMONIA in the last 168 hours.  ABG No results found for: PHART, PCO2ART, PO2ART, HCO3, TCO2, ACIDBASEDEF, O2SAT   Coagulation Profile: Recent Labs  Lab 01/31/20 0007 01/31/20 0506  INR 1.3* 1.4*    Cardiac Enzymes: No results for input(s): CKTOTAL, CKMB, CKMBINDEX, TROPONINI in the last 168 hours.  HbA1C: Hgb A1c MFr Bld  Date/Time Value Ref Range Status  01/31/2020 05:57 AM 6.5 (H) 4.8 - 5.6 % Final    Comment:    (NOTE) Pre diabetes:          5.7%-6.4% Diabetes:              >6.4% Glycemic control for   <7.0% adults with diabetes   08/27/2019 09:10 AM 6.6 (H) 4.6 - 6.5 % Final    Comment:    Glycemic Control Guidelines for People with Diabetes:Non Diabetic:  <6%Goal of Therapy: <7%Additional Action Suggested:  >8%     CBG: Recent Labs  Lab 01/31/20 1549 01/31/20 2002 01/31/20 2357 02/01/20 0402 02/01/20 0743  GLUCAP 86 162* 92 114* 113*   35 min spent with >50% of time in counseling and coordination of care.  Kipp Brood, MD [facility] ICU Physician [place]  Pager: [phone] Mobile: [phone] After hours: [phone].  02/01/2020, 9:16 AM

## 2020-02-01 NOTE — Progress Notes (Signed)
[place] Progress Note Patient Name: [name] DOB: 05-29-1941 MRN: [id number]   Date of Service  02/01/2020  HPI/Events of Note  Patient having PVC's - No AM labs ordered.   eICU Interventions  Will order: 1. BMP and Mg++ level at 5 AM.      Intervention Category Major Interventions: Arrhythmia - evaluation and management  Donnis Phaneuf [place] 02/01/2020, 12:15 AM

## 2020-02-01 NOTE — Progress Notes (Signed)
Patient still under CCM ,Paged on call.E link nurse responded and passed to on call M.D.. MD on call  Not aware of this MEWS protocol ,and nurse explained that ,updated patient's condition and the on call said, '' maybe this what we call sepsis code and the e- link nurse would be able to help.She is going to call you back.

## 2020-02-01 NOTE — Progress Notes (Signed)
As we were doing bedside reporting,patient's husband said " this kind of breathing is normal for her,she has that kind of breathing every now and then.

## 2020-02-02 LAB — BASIC METABOLIC PANEL
Anion gap: 10 (ref 5–15)
BUN: 21 mg/dL (ref 8–23)
CO2: 20 mmol/L — ABNORMAL LOW (ref 22–32)
Calcium: 8 mg/dL — ABNORMAL LOW (ref 8.9–10.3)
Chloride: 113 mmol/L — ABNORMAL HIGH (ref 98–111)
Creatinine, Ser: 1.33 mg/dL — ABNORMAL HIGH (ref 0.44–1.00)
GFR calc Af Amer: 44 mL/min — ABNORMAL LOW (ref >60)
GFR calc non Af Amer: 38 mL/min — ABNORMAL LOW (ref >60)
Glucose, Bld: 142 mg/dL — ABNORMAL HIGH (ref 70–99)
Potassium: 3.3 mmol/L — ABNORMAL LOW (ref 3.5–5.1)
Sodium: 143 mmol/L (ref 135–145)

## 2020-02-02 LAB — CBC WITH DIFFERENTIAL/PLATELET
Abs Immature Granulocytes: 0.12 10*3/uL — ABNORMAL HIGH (ref 0.00–0.07)
Basophils Absolute: 0 10*3/uL (ref 0.0–0.1)
Basophils Relative: 0 %
Eosinophils Absolute: 0 10*3/uL (ref 0.0–0.5)
Eosinophils Relative: 0 %
HCT: 32.3 % — ABNORMAL LOW (ref 36.0–46.0)
Hemoglobin: 10.8 g/dL — ABNORMAL LOW (ref 12.0–15.0)
Immature Granulocytes: 1 %
Lymphocytes Relative: 9 %
Lymphs Abs: 0.9 10*3/uL (ref 0.7–4.0)
MCH: 29.5 pg (ref 26.0–34.0)
MCHC: 33.4 g/dL (ref 30.0–36.0)
MCV: 88.3 fL (ref 80.0–100.0)
Monocytes Absolute: 1.1 10*3/uL — ABNORMAL HIGH (ref 0.1–1.0)
Monocytes Relative: 11 %
Neutro Abs: 7.3 10*3/uL (ref 1.7–7.7)
Neutrophils Relative %: 79 %
Platelets: 139 10*3/uL — ABNORMAL LOW (ref 150–400)
RBC: 3.66 MIL/uL — ABNORMAL LOW (ref 3.87–5.11)
RDW: 13.2 % (ref 11.5–15.5)
WBC: 9.5 10*3/uL (ref 4.0–10.5)
nRBC: 0 % (ref 0.0–0.2)

## 2020-02-02 LAB — CULTURE, BLOOD (ROUTINE X 2): Special Requests: ADEQUATE

## 2020-02-02 LAB — URINE CULTURE: Culture: >=100000 — AB

## 2020-02-02 LAB — BRAIN NATRIURETIC PEPTIDE: B Natriuretic Peptide: 712.9 pg/mL — ABNORMAL HIGH (ref 0.0–100.0)

## 2020-02-02 LAB — GLUCOSE, CAPILLARY
Glucose-Capillary: 126 mg/dL — ABNORMAL HIGH (ref 70–99)
Glucose-Capillary: 109 mg/dL — ABNORMAL HIGH (ref 70–99)
Glucose-Capillary: 125 mg/dL — ABNORMAL HIGH (ref 70–99)
Glucose-Capillary: 123 mg/dL — ABNORMAL HIGH (ref 70–99)

## 2020-02-02 LAB — TSH: TSH: 2.102 u[IU]/mL (ref 0.350–4.500)

## 2020-02-02 LAB — MAGNESIUM: Magnesium: 2 mg/dL (ref 1.7–2.4)

## 2020-02-02 MED ORDER — POTASSIUM CHLORIDE CRYS ER 20 MEQ PO TBCR
40.0000 meq | EXTENDED_RELEASE_TABLET | Freq: Once | ORAL | Status: AC
  Administered 2020-02-02: 40 meq via ORAL
  Filled 2020-02-02: qty 2

## 2020-02-02 MED ORDER — CARVEDILOL 6.25 MG PO TABS
6.2500 mg | ORAL_TABLET | Freq: Two times a day (BID) | ORAL | Status: DC
  Administered 2020-02-02 – 2020-02-05 (×7): 6.25 mg via ORAL
  Filled 2020-02-02 (×7): qty 1

## 2020-02-02 MED ORDER — ASPIRIN 81 MG PO CHEW
81.0000 mg | CHEWABLE_TABLET | Freq: Every day | ORAL | Status: DC
  Administered 2020-02-02 – 2020-02-06 (×5): 81 mg via ORAL
  Filled 2020-02-02 (×5): qty 1

## 2020-02-02 MED ORDER — AMLODIPINE BESYLATE 10 MG PO TABS
10.0000 mg | ORAL_TABLET | Freq: Every day | ORAL | Status: DC
  Administered 2020-02-02: 10 mg via ORAL
  Filled 2020-02-02: qty 1

## 2020-02-02 NOTE — Progress Notes (Signed)
RN just spoke with nurse on call for [facility] regarding oxygen desating with movement and needing a prn order for oxygen to maintain sats in 90s. RN was told to have respiratory to come and assess and to call nurse back before receiving the order. RN called Tim with respiratory to come and assess pt.   [name], RN

## 2020-02-02 NOTE — Progress Notes (Signed)
No response from NP on call. RN messaged again regarding oxygen and needing an order.  [name], RN

## 2020-02-02 NOTE — Evaluation (Signed)
Occupational Therapy Evaluation Patient Details Name: [name] MRN: [id number] DOB: July 24, 1941 Today's Date: 02/02/2020    History of Present Illness 79 year old female with several day history of progressively worsening weakness and fever.  Found to have UTI, hypotension and transient A fib in ER  PMHx:of DMT2, HTN, CKDx stage III, diabetic PN and remote history of breast cancer    Clinical Impression   Pt PTA: pt living at home with spouse in 1L home with 3 steps to get in. Pt reports near independence with ADL/IADL but reports 1-2 falls/wk. Pt currently limited by decreased strength, decreased activity tolerance and decreased ability to care for self. Pt requiring assist for ADL routine with minA overall requiring increased assist. Pt minA overall for mobility in room and decreased awareness of deficits. Pt's spouse fearful of pt's parkinson's worsening and LB "giving out" with her mobility. Pt would greatly benefit from continued OT skilled services for ADL, mobility and cognition. OT following acutely. Pt >90% O2 on RA; HR 116 BPM with exertion.      Follow Up Recommendations  SNF(Pt unable to properly care for self.)    Equipment Recommendations  Other (comment)(defer to next facility)    Recommendations for Other Services       Precautions / Restrictions Precautions Precautions: Fall;Other (comment) Precaution Comments: watch HR; falls 1-2x/wk Restrictions Weight Bearing Restrictions: No      Mobility Bed Mobility               General bed mobility comments: up in recliner pre and post session  Transfers Overall transfer level: Needs assistance Equipment used: Rolling walker (2 wheeled) Transfers: Sit to/from [facility] Sit to Stand: Min guard Stand pivot transfers: Min assist       General transfer comment: minguardA to initiate rocking forward "nose over toes" and pt with no physical assist STS x1 from [facility] and recliner; MinA for  transfer to avoid plopping    Balance Overall balance assessment: Needs assistance   Sitting balance-Leahy Scale: Good     Standing balance support: Single extremity supported;During functional activity Standing balance-Leahy Scale: Poor Standing balance comment: standing at sink for grooming                           ADL either performed or assessed with clinical judgement   ADL Overall ADL's : Needs assistance/impaired Eating/Feeding: Modified independent;Sitting   Grooming: Supervision/safety;Sitting   Upper Body Bathing: Supervision/ safety;Sitting   Lower Body Bathing: Minimal assistance;Sit to/from stand;Sitting/lateral leans;Cueing for safety   Upper Body Dressing : Supervision/safety;Sitting   Lower Body Dressing: Minimal assistance;Cueing for safety;Sitting/lateral leans;Sit to/from stand   Toilet Transfer: Min guard;Ambulation;BSC Toilet Transfer Details (indicate cue type and reason): Pt needing to abruptly sit down as pt having incontinence or sudden urge while standing at sink to use BSC behind her. Toileting- Clothing Manipulation and Hygiene: Minimal assistance;Sitting/lateral lean;Sit to/from stand Toileting - Clothing Manipulation Details (indicate cue type and reason): Pt able to perform anterior and posterior pericare, but requiring assist for cleanliness of posterior pericare      Functional mobility during ADLs: Minimal assistance;Cueing for safety;Rolling walker General ADL Comments: Pt limited by decreased strength, decreased activity tolerance and decreased ability to care for self with reports of 1-2 falls per week. Pt requiring assist for ADL routine with minA overall. Pt performing LB dressing with figure 4 technique, but would be unsteady without physical assist.      Vision  Baseline Vision/History: No visual deficits Patient Visual Report: No change from baseline Vision Assessment?: No apparent visual deficits     Perception      Praxis      Pertinent Vitals/Pain Pain Assessment: No/denies pain     Hand Dominance Right   Extremity/Trunk Assessment Upper Extremity Assessment RUE Deficits / Details: Separation of R shoulder RUE Coordination: decreased fine motor;decreased gross motor LUE Deficits / Details: AROM, WFLs   Lower Extremity Assessment Lower Extremity Assessment: Generalized weakness   Cervical / Trunk Assessment Cervical / Trunk Assessment: Kyphotic   Communication Communication Communication: No difficulties   Cognition Arousal/Alertness: Awake/alert Behavior During Therapy: WFL for tasks assessed/performed Overall Cognitive Status: Impaired/Different from baseline Area of Impairment: Safety/judgement                         Safety/Judgement: Decreased awareness of safety;Decreased awareness of deficits     General Comments: Pt not aware of vastness of poor mobility that leads to falls and caregiver burden.   General Comments  >90% O2 on RA; HR 116 BPM with exertion. Spouse present for session and voiced concerns that he is the sole caregiver and cannot keep helping her when she falls.    Exercises     Shoulder Instructions      Home Living Family/patient expects to be discharged to:: Private residence Living Arrangements: Spouse/significant other Available Help at Discharge: Family;Available 24 hours/day Type of Home: House Home Access: Stairs to enter [employer] of Steps: 3 Entrance Stairs-Rails: Right Home Layout: One level     Bathroom Shower/Tub: Tub/shower unit;Door   Bathroom Toilet: Handicapped height     Home Equipment: Grab bars - toilet;Grab bars - tub/shower;Shower seat          Prior Functioning/Environment Level of Independence: Independent with assistive device(s)        Comments: Pt used rollator most times in/out; pt spouse provides transport; pt was going grocery shopping ; medication management; reports independent with  ADL and IADL. Pt and spouse cook easy meals.        OT Problem List: Decreased strength;Decreased activity tolerance;Impaired balance (sitting and/or standing);Decreased safety awareness;Pain;Increased edema;Decreased knowledge of use of DME or AE      OT Treatment/Interventions: Self-care/ADL training;Therapeutic exercise;Energy conservation;DME and/or AE instruction;Therapeutic activities;Patient/family education;Balance training;Cognitive remediation/compensation    OT Goals(Current goals can be found in the care plan section) Acute Rehab OT Goals Patient Stated Goal: to walk OT Goal Formulation: With patient/family Time For Goal Achievement: 02/16/20 Potential to Achieve Goals: Good ADL Goals Pt Will Perform Grooming: with modified independence;standing Pt Will Perform Lower Body Dressing: with modified independence;sitting/lateral leans;sit to/from stand Additional ADL Goal #1: Pt will state 3 fall prevention strategies in order to increase safety awareness. Additional ADL Goal #2: Pt will complete x10 mins ADL task standing at sink with 1 rest break in order to increase activity tolerance.  OT Frequency: Min 2X/week   Barriers to D/C:            Co-evaluation              AM-PAC OT "6 Clicks" Daily Activity     Outcome Measure Help from another person eating meals?: None Help from another person taking care of personal grooming?: A Little Help from another person toileting, which includes using toliet, bedpan, or urinal?: A Little Help from another person bathing (including washing, rinsing, drying)?: A Little Help from another person to put on and taking  off regular upper body clothing?: None Help from another person to put on and taking off regular lower body clothing?: A Little 6 Click Score: 20   End of Session Equipment Utilized During Treatment: Gait belt Nurse Communication: Mobility status  Activity Tolerance: Patient tolerated treatment well Patient  left: in chair;with call bell/phone within reach;with chair alarm set;with family/visitor present  OT Visit Diagnosis: Unsteadiness on feet (R26.81);Muscle weakness (generalized) (M62.81)                Time: 1225-1300 OT Time Calculation (min): 35 min Charges:  OT General Charges $OT Visit: 1 Visit OT Evaluation $OT Eval Moderate Complexity: 1 Mod OT Treatments $Self Care/Home Management : 8-22 mins  Jefferey Pica, OTR/L Acute Rehabilitation Services Pager: [phone] Office: [phone]   Mindy Gali C 02/02/2020, 4:04 PM

## 2020-02-02 NOTE — Progress Notes (Signed)
RN just spoke with [name] on call with [facility] and was told again that she had messaged the on call regarding oxygen order and that she would take care of it.This RN also informed on call RN that RN was told pt had a burst of SVT, however the strip saved is longer than a burst and an EKG is needed. Orders were received.   [name], RN

## 2020-02-02 NOTE — Progress Notes (Signed)
[facility] Progress Note Patient Name: [name] DOB: 05/20/1941 MRN: [id number]   Date of Service  02/02/2020  HPI/Events of Note  Notified of tachycardia, possible stridor and requiring 2 L [place]. On RT bedside assessment patient is not in distress. K 3.3. Im unable to camera in this room.  eICU Interventions  Replace K 40 meqs. Please refer back if assistance needed.      Intervention Category Intermediate Interventions: Other:  Judd Lien 02/02/2020, 7:00 AM

## 2020-02-02 NOTE — Progress Notes (Signed)
PROGRESS NOTE                                                                                                                                                                                                             Patient Demographics:    [name], is a 79 y.o. female, DOB - 1941-03-12, [id number]  Admit date - 01/30/2020   Admitting Physician Chesley Mires, MD  Outpatient Primary MD for the patient is Binnie Rail, MD  LOS - 2  Chief Complaint  Patient presents with  . Fever       Brief Narrative - 79 year old Caucasian female history of DM type II, hypertension, CKD 3, diabetic peripheral neuropathy, past history of breast cancer, IBS, GERD and Gilbert's syndrome who was admitted to the hospital for sepsis arising from UTI, was under ICU care, briefly required pressors, now stabilized and transferred to hospitalist service on 02/02/2020.   Subjective:    Ihor Gully today has, No headache, No chest pain, No abdominal pain - No Nausea, No new weakness tingling or numbness, No Cough - SOB.    Assessment  & Plan :     1. Sepsis due to E.Coli UTI - was in ICU and required pressors briefly, with supportive care has sepsis pathophysiology seems to have resolved, E. coli seems to be pansensitive and for now continue IV Rocephin.  2.  Brief run of atrial fibrillation in ICU while she was on pressor.  Per ICU MD Dr. Lynetta Mare resolved after a brief run, her [place] vas 2 score certainly is greater than 3 however this seems to be in the setting of a reversible cause, echo is stable with preserved EF and no wall motion abnormality, she has no history of stroke, TSH will be obtained.  For now she will be placed on aspirin 81 mg along with beta-blocker.  Upon discharge will do a 30-day event monitor and follow-up with cardiology, if there is further recurrence of atrial fibrillation then certainly she requires full anticoagulation and further work-up.  3.  Generalized  weakness.  Due to #1 above.  PT OT eval.  Patient eager to go home however husband states that he might not be able to take care of her at home if she remains this week.  4.  Possible cirrhosis on her CT scan.  LFTs remain stable, total bili is high but she carries a diagnosis of Gilbert's syndrome, INR was borderline at 1.4, will defer further work-up to PCP outpatient she  may benefit from cardiology evaluation 1 time post discharge.  Question if she has underlying NASH.  5.  History of Parkinson's.  Continue combination of carbidopa levodopa.  6.  Essential hypertension.  Placed on Coreg.  Discontinue Norvasc.  7.  AKI on CKD 2.  Stable.  Baseline creatinine around 1.2.  Trend is improving.   8.  DM type II.  On Glucophage at home, sliding scale here will be continued.  Lab Results  Component Value Date   HGBA1C 6.5 (H) 01/31/2020   CBG (last 3)  Recent Labs    02/01/20 1636 02/01/20 2317 02/02/20 0647  GLUCAP 126* 121* 126*      Condition - Fair  Family Communication  : Kristine Linea [phone] on 02/02/2020  Code Status :  Full  Consults  :  PCCM  Procedures  :    CT -  1. No acute findings within the abdomen or pelvis. No bowel obstruction or evidence of bowel wall inflammation. No renal or ureteral calculi. No evidence of pyelonephritis. 2. Cirrhotic appearing liver. Aortic Atherosclerosis  TTE  1. Left ventricular ejection fraction, by estimation, is 60 to 65%. The left ventricle has normal function. The left ventricle has no regional wall motion abnormalities. Left ventricular diastolic parameters were normal.  2. Right ventricular systolic function is normal. The right ventricular size is normal.  3. Left atrial size was mildly dilated.  4. The mitral valve is degenerative. Trivial mitral valve regurgitation. No evidence of mitral stenosis.  5. The aortic valve is tricuspid. Aortic valve regurgitation is not visualized. Mild to moderate aortic valve  sclerosis/calcification is present, without any evidence of aortic stenosis.  6. The inferior vena cava is normal in size with greater than 50% respiratory variability, suggesting right atrial pressure of 3 mmHg.   PUD Prophylaxis : Lovenox  Disposition Plan  :    Status is: Inpatient  Remains inpatient appropriate because:IV treatments appropriate due to intensity of illness or inability to take PO   Dispo: The patient is from: Home              Anticipated d/c is to: Home              Anticipated d/c date is: 2 days              Patient currently is not medically stable to d/c.   DVT Prophylaxis  :  Lovenox   Lab Results  Component Value Date   PLT 139 (L) 02/02/2020    Diet :   Diet Order            Diet heart healthy/carb modified Room service appropriate? Yes; Fluid consistency: Thin  Diet effective now               Inpatient Medications Scheduled Meds: . amLODipine  10 mg Oral Daily  . carbidopa-levodopa  1 tablet Oral TID  . Chlorhexidine Gluconate Cloth  6 each Topical Daily  . enoxaparin (LOVENOX) injection  30 mg Subcutaneous Q24H  . insulin aspart  0-15 Units Subcutaneous TID AC & HS  . mouth rinse  15 mL Mouth Rinse BID   Continuous Infusions: . cefTRIAXone (ROCEPHIN)  IV Stopped (02/01/20 1541)   PRN Meds:.albuterol, [DISCONTINUED] ondansetron **OR** ondansetron (ZOFRAN) IV  Antibiotics  :   Anti-infectives (From admission, onward)   Start     Dose/Rate Route Frequency Ordered Stop   02/01/20 1600  cefTRIAXone (ROCEPHIN) 2 g in sodium chloride 0.9 % 100 mL  IVPB     2 g 200 mL/hr over 30 Minutes Intravenous Every 24 hours 02/01/20 0841     02/01/20 1000  cefTRIAXone (ROCEPHIN) 1 g in sodium chloride 0.9 % 100 mL IVPB  Status:  Discontinued     1 g 200 mL/hr over 30 Minutes Intravenous Every 24 hours 02/01/20 0835 02/01/20 0841   01/31/20 0500  piperacillin-tazobactam (ZOSYN) IVPB 3.375 g  Status:  Discontinued     3.375 g 12.5 mL/hr over 240  Minutes Intravenous Every 8 hours 01/31/20 0459 02/01/20 0833   01/31/20 0015  cefTRIAXone (ROCEPHIN) 1 g in sodium chloride 0.9 % 100 mL IVPB     1 g 200 mL/hr over 30 Minutes Intravenous  Once 01/31/20 0007 01/31/20 0105        Objective:   Vitals:   02/02/20 0845 02/02/20 0854 02/02/20 0857 02/02/20 0952  BP:    137/65  Pulse:    94  Resp:    18  Temp:    98.9 F (37.2 C)  TempSrc:    Oral  SpO2: 95% 94% 96% 96%  Weight:      Height:        SpO2: 96 % O2 Flow Rate (L/min): 2 L/min  Wt Readings from Last 3 Encounters:  01/31/20 73.5 kg  08/27/19 74.3 kg  02/24/19 75.8 kg     Intake/Output Summary (Last 24 hours) at 02/02/2020 1001 Last data filed at 02/02/2020 0550 Gross per 24 hour  Intake 235.58 ml  Output 350 ml  Net -114.42 ml     Physical Exam  Awake Alert, No new F.N deficits, Normal affect [place].AT,PERRAL Supple Neck,No JVD, No cervical lymphadenopathy appriciated.  Symmetrical Chest wall movement, Good air movement bilaterally, CTAB RRR,No Gallops,Rubs or new Murmurs, No Parasternal Heave +ve B.Sounds, Abd Soft, No tenderness, No organomegaly appriciated, No rebound - guarding or rigidity. No Cyanosis, Clubbing or edema, No new Rash or bruise     Data Review:    Recent Labs  Lab 01/31/20 0007 01/31/20 0506 02/02/20 0500  WBC 14.5* 9.1 9.5  HGB 12.5 11.4* 10.8*  HCT 37.4 34.3* 32.3*  PLT 176 151 139*  MCV 87.6 88.6 88.3  MCH 29.3 29.5 29.5  MCHC 33.4 33.2 33.4  RDW 12.9 13.1 13.2  LYMPHSABS 0.6* 0.4* 0.9  MONOABS 1.7* 0.8 1.1*  EOSABS 0.0 0.0 0.0  BASOSABS 0.0 0.0 0.0    Recent Labs  Lab 01/31/20 0007 01/31/20 0506 01/31/20 0557 02/01/20 0227 02/02/20 0500  NA 136 142  --  142 143  K 2.7* 3.5  --  3.7 3.3*  CL 101 109  --  113* 113*  CO2 21* 19*  --  20* 20*  GLUCOSE 221* 153*  --  124* 142*  BUN 23 22  --  23 21  CREATININE 1.81* 1.60*  --  1.54* 1.33*  CALCIUM 8.4* 7.7*  --  7.3* 8.0*  AST 32 32  --   --   --   ALT 28 26   --   --   --   ALKPHOS 45 65  --   --   --   BILITOT 2.4* 1.9*  --   --   --   ALBUMIN 2.8* 2.3*  --   --   --   MG  --   --   --  1.7 2.0  PROCALCITON  --  12.36  --   --   --   INR 1.3* 1.4*  --   --   --  HGBA1C  --   --  6.5*  --   --   BNP  --   --   --   --  712.9*    Recent Labs  Lab 01/31/20 0151 01/31/20 0506 02/02/20 0500  BNP  --   --  712.9*  PROCALCITON  --  12.36  --   SARSCOV2NAA NEGATIVE  --   --     ------------------------------------------------------------------------------------------------------------------ No results for input(s): CHOL, HDL, LDLCALC, TRIG, CHOLHDL, LDLDIRECT in the last 72 hours.  Lab Results  Component Value Date   HGBA1C 6.5 (H) 01/31/2020   ------------------------------------------------------------------------------------------------------------------ No results for input(s): TSH, T4TOTAL, T3FREE, THYROIDAB in the last 72 hours.  Invalid input(s): FREET3 ------------------------------------------------------------------------------------------------------------------ No results for input(s): VITAMINB12, FOLATE, FERRITIN, TIBC, IRON, RETICCTPCT in the last 72 hours.  Coagulation profile Recent Labs  Lab 01/31/20 0007 01/31/20 0506  INR 1.3* 1.4*    No results for input(s): DDIMER in the last 72 hours.  Cardiac Enzymes No results for input(s): CKMB, TROPONINI, MYOGLOBIN in the last 168 hours.  Invalid input(s): CK ------------------------------------------------------------------------------------------------------------------    Component Value Date/Time   BNP 712.9 (H) 02/02/2020 0500    Micro Results Recent Results (from the past 240 hour(s))  Blood Culture (routine x 2)     Status: Abnormal   Collection Time: 01/31/20 12:22 AM   Specimen: BLOOD  Result Value Ref Range Status   Specimen Description BLOOD RIGHT ANTECUBITAL  Final   Special Requests   Final    BOTTLES DRAWN AEROBIC AND ANAEROBIC Blood Culture  results may not be optimal due to an excessive volume of blood received in culture bottles   Culture  Setup Time   Final    GRAM NEGATIVE RODS IN BOTH AEROBIC AND ANAEROBIC BOTTLES CRITICAL RESULT CALLED TO, READ BACK BY AND VERIFIED WITH: PHATMD L CRRAU AN:[id number] AT 22 B Y CM Performed at [facility], [place] [address] [place], [place] [address]    Culture ESCHERICHIA COLI (A)  Final   Report Status 02/02/2020 FINAL  Final   Organism ID, Bacteria ESCHERICHIA COLI  Final      Susceptibility   Escherichia coli - MIC*    AMPICILLIN >=32 RESISTANT Resistant     CEFAZOLIN >=64 RESISTANT Resistant     CEFEPIME <=1 SENSITIVE Sensitive     CEFTAZIDIME <=1 SENSITIVE Sensitive     CEFTRIAXONE <=1 SENSITIVE Sensitive     CIPROFLOXACIN <=0.25 SENSITIVE Sensitive     GENTAMICIN <=1 SENSITIVE Sensitive     IMIPENEM <=0.25 SENSITIVE Sensitive     TRIMETH/SULFA <=20 SENSITIVE Sensitive     AMPICILLIN/SULBACTAM >=32 RESISTANT Resistant     PIP/TAZO 64 INTERMEDIATE Intermediate     * ESCHERICHIA COLI  Blood Culture ID Panel (Reflexed)     Status: Abnormal   Collection Time: 01/31/20 12:22 AM  Result Value Ref Range Status   Enterococcus species NOT DETECTED NOT DETECTED Final   Listeria monocytogenes NOT DETECTED NOT DETECTED Final   Staphylococcus species NOT DETECTED NOT DETECTED Final   Staphylococcus aureus (BCID) NOT DETECTED NOT DETECTED Final   Streptococcus species NOT DETECTED NOT DETECTED Final   Streptococcus agalactiae NOT DETECTED NOT DETECTED Final   Streptococcus pneumoniae NOT DETECTED NOT DETECTED Final   Streptococcus pyogenes NOT DETECTED NOT DETECTED Final   Acinetobacter baumannii NOT DETECTED NOT DETECTED Final   Enterobacteriaceae species DETECTED (A) NOT DETECTED Final    Comment: Enterobacteriaceae represent a large family of gram-negative bacteria, not a single organism. CRITICAL  RESULT CALLED TO, READ BACK BY AND VERIFIED WITH: PHARMD L CRRAU AN:[id number] AT 1420 BY  CM    Enterobacter cloacae complex NOT DETECTED NOT DETECTED Final   Escherichia coli DETECTED (A) NOT DETECTED Final    Comment: CRITICAL RESULT CALLED TO, READ BACK BY AND VERIFIED WITH: PHARMD L CRRAU AN:[id number] AT 1620 BY CM    Klebsiella oxytoca NOT DETECTED NOT DETECTED Final   Klebsiella pneumoniae NOT DETECTED NOT DETECTED Final   Proteus species NOT DETECTED NOT DETECTED Final   Serratia marcescens NOT DETECTED NOT DETECTED Final   Carbapenem resistance NOT DETECTED NOT DETECTED Final   Haemophilus influenzae NOT DETECTED NOT DETECTED Final   Neisseria meningitidis NOT DETECTED NOT DETECTED Final   Pseudomonas aeruginosa NOT DETECTED NOT DETECTED Final   Candida albicans NOT DETECTED NOT DETECTED Final   Candida glabrata NOT DETECTED NOT DETECTED Final   Candida krusei NOT DETECTED NOT DETECTED Final   Candida parapsilosis NOT DETECTED NOT DETECTED Final   Candida tropicalis NOT DETECTED NOT DETECTED Final    Comment: Performed at [facility], [place] [address], [place], [place] [address]  Blood Culture (routine x 2)     Status: Abnormal   Collection Time: 01/31/20 12:23 AM   Specimen: BLOOD LEFT WRIST  Result Value Ref Range Status   Specimen Description BLOOD LEFT WRIST  Final   Special Requests   Final    BOTTLES DRAWN AEROBIC ONLY Blood Culture adequate volume   Culture  Setup Time   Final    GRAM NEGATIVE RODS AEROBIC BOTTLE ONLY CRITICAL VALUE NOTED.  VALUE IS CONSISTENT WITH PREVIOUSLY REPORTED AND CALLED VALUE.    Culture (A)  Final    ESCHERICHIA COLI SUSCEPTIBILITIES PERFORMED ON PREVIOUS CULTURE WITHIN THE LAST 5 DAYS. Performed at [facility], [place] [address], [place], [place] [address]    Report Status 02/02/2020 FINAL  Final  Urine culture     Status: Abnormal   Collection Time: 01/31/20  1:51 AM   Specimen: Urine, Clean Catch  Result Value Ref Range Status   Specimen Description URINE, CLEAN CATCH  Final   Special Requests   Final     NONE Performed at [facility], [place] [address] [place],  [address]    Culture >=100,000 COLONIES/mL ESCHERICHIA COLI (A)  Final   Report Status 02/02/2020 FINAL  Final   Organism ID, Bacteria ESCHERICHIA COLI (A)  Final      Susceptibility   Escherichia coli - MIC*    AMPICILLIN >=32 RESISTANT Resistant     CEFAZOLIN >=64 RESISTANT Resistant     CEFTRIAXONE <=1 SENSITIVE Sensitive     CIPROFLOXACIN <=0.25 SENSITIVE Sensitive     GENTAMICIN <=1 SENSITIVE Sensitive     IMIPENEM <=0.25 SENSITIVE Sensitive     NITROFURANTOIN <=16 SENSITIVE Sensitive     TRIMETH/SULFA <=20 SENSITIVE Sensitive     AMPICILLIN/SULBACTAM >=32 RESISTANT Resistant     PIP/TAZO 16 SENSITIVE Sensitive     * >=100,000 COLONIES/mL ESCHERICHIA COLI  SARS Coronavirus 2 by RT PCR (hospital order, performed in [place] hospital lab) Nasopharyngeal Nasopharyngeal Swab     Status: None   Collection Time: 01/31/20  1:51 AM   Specimen: Nasopharyngeal Swab  Result Value Ref Range Status   SARS Coronavirus 2 NEGATIVE NEGATIVE Final    Comment: (NOTE) SARS-CoV-2 target nucleic acids are NOT DETECTED. The SARS-CoV-2 RNA is generally detectable in upper and lower respiratory specimens during the acute phase of infection.  The lowest concentration of SARS-CoV-2 viral copies this assay can detect is 250 copies / mL. A negative result does not preclude SARS-CoV-2 infection and should not be used as the sole basis for treatment or other patient management decisions.  A negative result may occur with improper specimen collection / handling, submission of specimen other than nasopharyngeal swab, presence of viral mutation(s) within the areas targeted by this assay, and inadequate number of viral copies (<250 copies / mL). A negative result must be combined with clinical observations, patient history, and epidemiological information. Fact Sheet for Patients:   StrictlyIdeas.no Fact Sheet  for Healthcare Providers: BankingDealers.co.za This test is not yet approved or cleared  by the [place] FDA and has been authorized for detection and/or diagnosis of SARS-CoV-2 by FDA under an Emergency Use Authorization (EUA).  This EUA will remain in effect (meaning this test can be used) for the duration of the COVID-19 declaration under Section 564(b)(1) of the Act, 21 U.S.C. section 360bbb-3(b)(1), unless the authorization is terminated or revoked sooner. Performed at [facility], [place] [address], [place], [place] [address]   MRSA PCR Screening     Status: None   Collection Time: 01/31/20  4:35 PM   Specimen: Nasopharyngeal  Result Value Ref Range Status   MRSA by PCR NEGATIVE NEGATIVE Final    Comment:        The GeneXpert MRSA Assay (FDA approved for NASAL specimens only), is one component of a comprehensive MRSA colonization surveillance program. It is not intended to diagnose MRSA infection nor to guide or monitor treatment for MRSA infections. Performed at [facility], [place] [address], [place], [place] [address]     Radiology Reports CT ABDOMEN PELVIS WO CONTRAST  Result Date: 01/31/2020 CLINICAL DATA:  Sepsis, complicated UTI. Evaluate for pyelonephritis or infected stone. EXAM: CT ABDOMEN AND PELVIS WITHOUT CONTRAST TECHNIQUE: Multidetector CT imaging of the abdomen and pelvis was performed following the standard protocol without IV contrast. COMPARISON:  CT abdomen dated 01/04/2010. FINDINGS: Lower chest: No acute abnormality. Hepatobiliary: Peripheral liver contours are slightly nodular suggesting cirrhosis. No focal liver abnormality is seen. Gallbladder is unremarkable. No bile duct dilatation seen. Pancreas: Unremarkable. No pancreatic ductal dilatation or surrounding inflammatory changes. Spleen: Normal in size without focal abnormality. Adrenals/Urinary Tract: Adrenal glands appear normal. Kidneys are unremarkable without  mass, stone or hydronephrosis. No ureteral or bladder calculi are identified. Bladder is unremarkable, partially decompressed. Stomach/Bowel: No dilated large or small bowel loops. No evidence of bowel wall inflammation. Appendix is not seen but there are no inflammatory changes about the cecum to suggest acute appendicitis. Stomach is unremarkable, decompressed. Vascular/Lymphatic: Aortic atherosclerosis. No enlarged lymph nodes seen. Reproductive: Presumed hysterectomy. No adnexal mass or free fluid. Other: No free fluid or abscess collection seen. No free intraperitoneal air. Musculoskeletal: Mild degenerative spondylosis of the slightly scoliotic thoracolumbar spine. Superficial soft tissues are unremarkable. IMPRESSION: 1. No acute findings within the abdomen or pelvis. No bowel obstruction or evidence of bowel wall inflammation. No renal or ureteral calculi. No evidence of pyelonephritis. 2. Cirrhotic appearing liver. Aortic Atherosclerosis (ICD10-I70.0). Electronically Signed   By: Franki Cabot M.D.   On: 01/31/2020 05:45   DG CHEST PORT 1 VIEW  Result Date: 01/31/2020 CLINICAL DATA:  Desaturation EXAM: PORTABLE CHEST 1 VIEW COMPARISON:  Radiograph 01/31/2020 FINDINGS: Chronically coarsened basilar interstitial changes and bronchitic features are similar to comparison studies likely accentuated by some basilar atelectasis. Apical lucency compatible with emphysematous changes seen on  prior cross-sectional imaging. No consolidation, features of edema, pneumothorax, or effusion. The aorta is calcified. The remaining cardiomediastinal contours are unremarkable. No acute osseous or soft tissue abnormality. Degenerative changes are present in the imaged spine and shoulders. Mild levocurvature of the spine. IMPRESSION: 1. Mild atelectasis, otherwise no acute cardiopulmonary abnormality. 2. Chronically coarsened basilar interstitial changes and bronchitic features are similar to comparison studies. 3.  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 Electronically Signed   By: Lovena Le M.D.   On: 01/31/2020 15:45   DG Chest Port 1 View  Result Date: 01/31/2020 CLINICAL DATA:  New atrial flutter. Fever and syncope. EXAM: PORTABLE CHEST 1 VIEW COMPARISON:  Chest x-rays dated 01/31/2020 and 10/11/2010. FINDINGS: Heart size and mediastinal contours are within normal limits. Lungs are clear. No pleural effusion or pneumothorax is seen. Osseous structures about the chest are unremarkable. IMPRESSION: No active disease. No evidence of pneumonia or pulmonary edema. Electronically Signed   By: Franki Cabot M.D.   On: 01/31/2020 04:26   DG Chest Port 1 View  Result Date: 01/31/2020 CLINICAL DATA:  Sepsis EXAM: PORTABLE CHEST 1 VIEW COMPARISON:  None. FINDINGS: The heart size and mediastinal contours are within normal limits. Both lungs are clear. The visualized skeletal structures are unremarkable. IMPRESSION: No active disease. Electronically Signed   By: Prudencio Pair M.D.   On: 01/31/2020 00:39   ECHOCARDIOGRAM COMPLETE  Result Date: 01/31/2020    ECHOCARDIOGRAM REPORT   Patient Name:   KINSLEA EWERS Date of Exam: 01/31/2020 Medical Rec #:  [id number]          Height:       64.0 in Accession #:    [id number]         Weight:       162.0 lb Date of Birth:  [birth year]          BSA:          1.789 m Patient Age:    60 years           BP:           106/51 mmHg Patient Gender: F                  HR:           91 bpm. Exam Location:  Inpatient Procedure: 2D Echo, Cardiac Doppler and Color Doppler Indications:    I48.91* Unspeicified atrial fibrillation  History:        Patient has prior history of Echocardiogram examinations, most                 recent 09/01/2008.  Sonographer:    Merrie Roof RDCS Referring Phys: [place]  1. Left ventricular ejection fraction, by estimation, is 60 to 65%. The left ventricle has normal function. The left ventricle has no regional wall motion abnormalities. Left  ventricular diastolic parameters were normal.  2. Right ventricular systolic function is normal. The right ventricular size is normal.  3. Left atrial size was mildly dilated.  4. The mitral valve is degenerative. Trivial mitral valve regurgitation. No evidence of mitral stenosis.  5. The aortic valve is tricuspid. Aortic valve regurgitation is not visualized. Mild to moderate aortic valve sclerosis/calcification is present, without any evidence of aortic stenosis.  6. The inferior vena cava is normal in size with greater than 50% respiratory variability, suggesting right atrial pressure of 3 mmHg. FINDINGS  Left Ventricle: Left ventricular ejection fraction, by estimation, is 60 to  65%. The left ventricle has normal function. The left ventricle has no regional wall motion abnormalities. The left ventricular internal cavity size was normal in size. There is  no left ventricular hypertrophy. Left ventricular diastolic parameters were normal. Right Ventricle: The right ventricular size is normal. No increase in right ventricular wall thickness. Right ventricular systolic function is normal. Left Atrium: Left atrial size was mildly dilated. Right Atrium: Right atrial size was normal in size. Pericardium: There is no evidence of pericardial effusion. Mitral Valve: The mitral valve is degenerative in appearance. There is mild thickening of the mitral valve leaflet(s). There is mild calcification of the mitral valve leaflet(s). Normal mobility of the mitral valve leaflets. Moderate mitral annular calcification. Trivial mitral valve regurgitation. No evidence of mitral valve stenosis. Tricuspid Valve: The tricuspid valve is normal in structure. Tricuspid valve regurgitation is trivial. No evidence of tricuspid stenosis. Aortic Valve: The aortic valve is tricuspid. Aortic valve regurgitation is not visualized. Mild to moderate aortic valve sclerosis/calcification is present, without any evidence of aortic stenosis. Pulmonic  Valve: The pulmonic valve was normal in structure. Pulmonic valve regurgitation is not visualized. No evidence of pulmonic stenosis. Aorta: The aortic root is normal in size and structure. Venous: The inferior vena cava is normal in size with greater than 50% respiratory variability, suggesting right atrial pressure of 3 mmHg. IAS/Shunts: No atrial level shunt detected by color flow Doppler.  LEFT VENTRICLE PLAX 2D LVIDd:         3.80 cm     Diastology LVIDs:         2.50 cm     LV e' lateral:   8.81 cm/s LV PW:         0.90 cm     LV E/e' lateral: 10.9 LV IVS:        1.00 cm     LV e' medial:    7.07 cm/s                            LV E/e' medial:  13.6  LV Volumes (MOD) LV vol d, MOD A4C: 46.5 ml LV vol s, MOD A4C: 17.7 ml LV SV MOD A4C:     46.5 ml RIGHT VENTRICLE             IVC RV Basal diam:  2.70 cm     IVC diam: 2.10 cm RV S prime:     10.10 cm/s TAPSE (M-mode): 1.9 cm LEFT ATRIUM           Index       RIGHT ATRIUM           Index LA diam:      4.10 cm 2.29 cm/m  RA Area:     10.80 cm LA Vol (A2C): 63.2 ml 35.33 ml/m RA Volume:   22.70 ml  12.69 ml/m LA Vol (A4C): 60.7 ml 33.93 ml/m  AORTIC VALVE LVOT Vmax:   90.20 cm/s LVOT Vmean:  60.700 cm/s LVOT VTI:    0.187 m  AORTA Ao Root diam: 3.00 cm Ao Asc diam:  2.90 cm MITRAL VALVE MV Area (PHT): 5.27 cm     SHUNTS MV Decel Time: 144 msec     Systemic VTI: 0.19 m MV E velocity: 96.00 cm/s MV A velocity: 125.00 cm/s MV E/A ratio:  0.77 Jenkins Rouge MD Electronically signed by Jenkins Rouge MD Signature Date/Time: 01/31/2020/2:20:10 PM    Final  Time Spent in minutes  30   Lala Lund M.D on 02/02/2020 at 10:01 AM  To page go to [email or web address] - password [facility]

## 2020-02-02 NOTE — Progress Notes (Signed)
Respiratory came to assess pt and stated that pt sounded a little wheezy and that oxygen seemed appropriate prn. RN called and spoke with nurse on call for [place] and she stated she would put order in and wanted RN to give a breathing tx. Breathing tx was given. RN will continue to monitor.   [name], RN

## 2020-02-02 NOTE — Progress Notes (Signed)
EKG is done. In chart.  [name], RN

## 2020-02-02 NOTE — Progress Notes (Signed)
Messaged NP on call for a PRN order for oxygen to maintain O2 sats at 93% or better due to pt keep sliding down in bed and with movement pt's oxygen sats drop into the high 80s. Pt is forgetful and will not stay sitting up on her own. Awaiting response.   [name], RN

## 2020-02-02 NOTE — Evaluation (Signed)
Physical Therapy Evaluation Patient Details Name: [name] MRN: [id number] DOB: 02-Aug-1941 Today's Date: 02/02/2020   History of Present Illness  79 year old female with several day history of progressively worsening weakness and fever.  Found to have UTI, hypotension and transient A fib in ER  PMHx:of DMT2, HTN, CKDx stage III, diabetic PN and remote history of breast cancer   Clinical Impression   Pt admitted with above diagnosis. pt living at home with spouse in 1L home with 3 steps to get in. Pt reports near independence with mobility, but reports 1-2 falls/wk. Pt currently limited by decreased strength, decreased activity tolerance and decreased ability to care for self, high fall risk;  Pt currently with functional limitations due to the deficits listed below (see PT Problem List). Pt will benefit from skilled PT to increase their independence and safety with mobility to allow discharge to the venue listed below.       Follow Up Recommendations SNF    Equipment Recommendations       Recommendations for Other Services       Precautions / Restrictions Precautions Precautions: Fall;Other (comment) Precaution Comments: watch HR; falls 1-2x/wk Restrictions Weight Bearing Restrictions: No      Mobility  Bed Mobility               General bed mobility comments: up in recliner pre and post session  Transfers Overall transfer level: Needs assistance Equipment used: Rolling walker (2 wheeled) Transfers: Sit to/from Stand Sit to Stand: Mod assist Stand pivot transfers: Min assist       General transfer comment: Light mod assist to help translate center of mass forward; pt performed a few unsuccessful tries; decr contol with stand to sit  Ambulation/Gait Ambulation/Gait assistance: Min assist Gait Distance (Feet): 20 Feet Assistive device: Rolling walker (2 wheeled) Gait Pattern/deviations: Decreased step length - right;Decreased step length - left;Decreased  stride length;Trunk flexed     General Gait Details: Cues to self-monitor for activity tolerance  Stairs            Wheelchair Mobility    Modified Rankin (Stroke Patients Only)       Balance Overall balance assessment: Needs assistance   Sitting balance-Leahy Scale: Good     Standing balance support: Single extremity supported;During functional activity Standing balance-Leahy Scale: Poor Standing balance comment: standing at sink for grooming                             Pertinent Vitals/Pain Pain Assessment: No/denies pain    Home Living Family/patient expects to be discharged to:: Private residence Living Arrangements: Spouse/significant other Available Help at Discharge: Family;Available 24 hours/day Type of Home: House Home Access: Stairs to enter Entrance Stairs-Rails: Right Entrance Stairs-Number of Steps: 3 Home Layout: One level Home Equipment: Grab bars - toilet;Grab bars - tub/shower;Shower seat      Prior Function Level of Independence: Independent with assistive device(s)         Comments: Pt used rollator most times in/out; pt spouse provides transport; pt was going grocery shopping ; medication management; reports independent with ADL and IADL. Pt and spouse cook easy meals.     Hand Dominance   Dominant Hand: Right    Extremity/Trunk Assessment   Upper Extremity Assessment Upper Extremity Assessment: Defer to OT evaluation RUE Deficits / Details: Separation of R shoulder RUE Coordination: decreased fine motor;decreased gross motor LUE Deficits / Details: AROM, WFLs  Lower Extremity Assessment Lower Extremity Assessment: Generalized weakness(and decr coordination)    Cervical / Trunk Assessment Cervical / Trunk Assessment: Kyphotic  Communication   Communication: No difficulties  Cognition Arousal/Alertness: Awake/alert Behavior During Therapy: WFL for tasks assessed/performed Overall Cognitive Status:  Impaired/Different from baseline Area of Impairment: Safety/judgement                         Safety/Judgement: Decreased awareness of safety;Decreased awareness of deficits     General Comments: Pt not aware of vastness of poor mobility that leads to falls and caregiver burden.      General Comments General comments (skin integrity, edema, etc.): >90% O2 on RA; HR 116 BPM with exertion. Spouse voiced concerns that he is the sole caregiver and cannot keep helping her when she falls.    Exercises     Assessment/Plan    PT Assessment Patient needs continued PT services  PT Problem List Decreased strength;Decreased range of motion;Decreased activity tolerance;Decreased balance;Decreased mobility;Decreased coordination;Decreased cognition;Decreased knowledge of use of DME;Decreased safety awareness;Decreased knowledge of precautions       PT Treatment Interventions DME instruction;Gait training;Stair training;Functional mobility training;Therapeutic activities;Therapeutic exercise;Balance training;Neuromuscular re-education;Cognitive remediation;Patient/family education    PT Goals (Current goals can be found in the Care Plan section)  Acute Rehab PT Goals Patient Stated Goal: to walk PT Goal Formulation: With patient Time For Goal Achievement: 02/16/20 Potential to Achieve Goals: Good    Frequency Min 2X/week   Barriers to discharge        Co-evaluation               AM-PAC PT "6 Clicks" Mobility  Outcome Measure Help needed turning from your back to your side while in a flat bed without using bedrails?: A Little Help needed moving from lying on your back to sitting on the side of a flat bed without using bedrails?: A Little Help needed moving to and from a bed to a chair (including a wheelchair)?: A Little Help needed standing up from a chair using your arms (e.g., wheelchair or bedside chair)?: A Little Help needed to walk in hospital room?: A Little Help  needed climbing 3-5 steps with a railing? : Total 6 Click Score: 16    End of Session Equipment Utilized During Treatment: Gait belt Activity Tolerance: Patient tolerated treatment well Patient left: in chair;with call bell/phone within reach;with chair alarm set Nurse Communication: Mobility status PT Visit Diagnosis: Unsteadiness on feet (R26.81);Other abnormalities of gait and mobility (R26.89);Muscle weakness (generalized) (M62.81)    Time: [id number] PT Time Calculation (min) (ACUTE ONLY): 14 min   Charges:   PT Evaluation $PT Eval Moderate Complexity: 1 Mod          Roney [place], [place]  Acute Rehabilitation Services Pager [phone] Office [phone]   [place]ta [place] 02/02/2020, 5:36 PM

## 2020-02-03 LAB — CBC WITH DIFFERENTIAL/PLATELET
Abs Immature Granulocytes: 0.16 10*3/uL — ABNORMAL HIGH (ref 0.00–0.07)
Basophils Absolute: 0 10*3/uL (ref 0.0–0.1)
Basophils Relative: 0 %
Eosinophils Absolute: 0.1 10*3/uL (ref 0.0–0.5)
Eosinophils Relative: 2 %
HCT: 31 % — ABNORMAL LOW (ref 36.0–46.0)
Hemoglobin: 10.3 g/dL — ABNORMAL LOW (ref 12.0–15.0)
Immature Granulocytes: 2 %
Lymphocytes Relative: 11 %
Lymphs Abs: 1 10*3/uL (ref 0.7–4.0)
MCH: 29.3 pg (ref 26.0–34.0)
MCHC: 33.2 g/dL (ref 30.0–36.0)
MCV: 88.3 fL (ref 80.0–100.0)
Monocytes Absolute: 0.9 10*3/uL (ref 0.1–1.0)
Monocytes Relative: 11 %
Neutro Abs: 6.3 10*3/uL (ref 1.7–7.7)
Neutrophils Relative %: 74 %
Platelets: 159 10*3/uL (ref 150–400)
RBC: 3.51 MIL/uL — ABNORMAL LOW (ref 3.87–5.11)
RDW: 13.4 % (ref 11.5–15.5)
WBC: 8.5 10*3/uL (ref 4.0–10.5)
nRBC: 0 % (ref 0.0–0.2)

## 2020-02-03 LAB — GLUCOSE, CAPILLARY
Glucose-Capillary: 111 mg/dL — ABNORMAL HIGH (ref 70–99)
Glucose-Capillary: 213 mg/dL — ABNORMAL HIGH (ref 70–99)
Glucose-Capillary: 80 mg/dL (ref 70–99)
Glucose-Capillary: 138 mg/dL — ABNORMAL HIGH (ref 70–99)

## 2020-02-03 LAB — MAGNESIUM: Magnesium: 1.7 mg/dL (ref 1.7–2.4)

## 2020-02-03 LAB — COMPREHENSIVE METABOLIC PANEL WITH GFR
ALT: 7 U/L (ref 0–44)
AST: 30 U/L (ref 15–41)
Albumin: 2 g/dL — ABNORMAL LOW (ref 3.5–5.0)
Alkaline Phosphatase: 55 U/L (ref 38–126)
Anion gap: 7 (ref 5–15)
BUN: 18 mg/dL (ref 8–23)
CO2: 24 mmol/L (ref 22–32)
Calcium: 8.1 mg/dL — ABNORMAL LOW (ref 8.9–10.3)
Chloride: 113 mmol/L — ABNORMAL HIGH (ref 98–111)
Creatinine, Ser: 1.17 mg/dL — ABNORMAL HIGH (ref 0.44–1.00)
GFR calc Af Amer: 52 mL/min — ABNORMAL LOW (ref >60)
GFR calc non Af Amer: 45 mL/min — ABNORMAL LOW (ref >60)
Glucose, Bld: 136 mg/dL — ABNORMAL HIGH (ref 70–99)
Potassium: 3.5 mmol/L (ref 3.5–5.1)
Sodium: 144 mmol/L (ref 135–145)
Total Bilirubin: 0.7 mg/dL (ref 0.3–1.2)
Total Protein: 5.4 g/dL — ABNORMAL LOW (ref 6.5–8.1)

## 2020-02-03 LAB — BRAIN NATRIURETIC PEPTIDE: B Natriuretic Peptide: 532.1 pg/mL — ABNORMAL HIGH (ref 0.0–100.0)

## 2020-02-03 MED ORDER — MAGNESIUM SULFATE IN D5W 1-5 GM/100ML-% IV SOLN
1.0000 g | Freq: Once | INTRAVENOUS | Status: AC
  Administered 2020-02-03: 1 g via INTRAVENOUS
  Filled 2020-02-03: qty 100

## 2020-02-03 MED ORDER — POTASSIUM CHLORIDE CRYS ER 20 MEQ PO TBCR
40.0000 meq | EXTENDED_RELEASE_TABLET | Freq: Once | ORAL | Status: AC
  Administered 2020-02-03: 40 meq via ORAL
  Filled 2020-02-03: qty 2

## 2020-02-03 MED ORDER — ENOXAPARIN SODIUM 40 MG/0.4ML ~~LOC~~ SOLN
40.0000 mg | SUBCUTANEOUS | Status: DC
  Administered 2020-02-03 – 2020-02-06 (×4): 40 mg via SUBCUTANEOUS
  Filled 2020-02-03 (×3): qty 0.4

## 2020-02-03 NOTE — Progress Notes (Signed)
PROGRESS NOTE                                                                                                                                                                                                             Patient Demographics:    [name], is a 79 y.o. female, DOB - [birth year], MY:[id number]  Admit date - 01/30/2020   Admitting Physician Chesley Mires, MD  Outpatient Primary MD for the patient is Binnie Rail, MD  LOS - 3  Chief Complaint  Patient presents with  . Fever       Brief Narrative - 79 year old Caucasian female history of DM type II, hypertension, CKD 3, diabetic peripheral neuropathy, past history of breast cancer, IBS, GERD and Gilbert's syndrome who was admitted to the hospital for sepsis arising from UTI, was under ICU care, briefly required pressors, now stabilized and transferred to hospitalist service on 02/02/2020.   Subjective:    [name] today has, No headache, No chest pain, No abdominal pain - No Nausea, No new weakness tingling or numbness, No Cough - SOB.    Assessment  & Plan :     1. Sepsis due to E.Coli UTI and bacteremia- was in ICU and required pressors briefly, with supportive care has sepsis pathophysiology seems to have resolved, E. coli seems to be pansensitive and for now continue IV Rocephin.  2.  Brief run of atrial fibrillation in ICU while she was on pressor.  Per ICU MD Dr. Lynetta Mare resolved after a brief run, her [place] vas 2 score certainly is greater than 3 however this seems to be in the setting of a reversible cause, echo is stable with preserved EF and no wall motion abnormality, she has no history of stroke, TSH will be obtained.  For now she will be placed on aspirin 81 mg along with beta-blocker.  Upon discharge will do a 30-day event monitor and follow-up with cardiology, if there is further recurrence of atrial fibrillation then certainly she requires full anticoagulation and further work-up.  3.   Generalized weakness.  Due to #1 above.  PT OT eval, patient agreeable for SNF bed awaited.  4.  Possible cirrhosis on her CT scan.  LFTs remain stable, total bili is high but she carries a diagnosis of Gilbert's syndrome, INR was borderline at 1.4, will defer further work-up to PCP outpatient she may benefit from GI evaluation 1 time post discharge.  Question if she has underlying NASH.  5.  History  of Parkinson's.  Continue combination of carbidopa levodopa.  6.  Essential hypertension.  Placed on Coreg.  Discontinue Norvasc.  7.  AKI on CKD 2.  Stable.  Baseline creatinine around 1.2.  Trend is improving and almost at baseline now.  8.  DM type II.  On Glucophage at home, sliding scale here will be continued.  Lab Results  Component Value Date   HGBA1C 6.5 (H) 01/31/2020   CBG (last 3)  Recent Labs    02/02/20 1609 02/02/20 2048 02/03/20 0638  GLUCAP 125* 123* 111*      Condition - Fair  Family Communication  : Kristine Linea [phone] on 02/02/2020  Code Status :  Full  Consults  :  PCCM  Procedures  :    CT -  1. No acute findings within the abdomen or pelvis. No bowel obstruction or evidence of bowel wall inflammation. No renal or ureteral calculi. No evidence of pyelonephritis. 2. Cirrhotic appearing liver. Aortic Atherosclerosis  TTE  1. Left ventricular ejection fraction, by estimation, is 60 to 65%. The left ventricle has normal function. The left ventricle has no regional wall motion abnormalities. Left ventricular diastolic parameters were normal.  2. Right ventricular systolic function is normal. The right ventricular size is normal.  3. Left atrial size was mildly dilated.  4. The mitral valve is degenerative. Trivial mitral valve regurgitation. No evidence of mitral stenosis.  5. The aortic valve is tricuspid. Aortic valve regurgitation is not visualized. Mild to moderate aortic valve sclerosis/calcification is present, without any evidence of  aortic stenosis.  6. The inferior vena cava is normal in size with greater than 50% respiratory variability, suggesting right atrial pressure of 3 mmHg.   PUD Prophylaxis : Lovenox  Disposition Plan  :    Status is: Inpatient  Remains inpatient appropriate because:IV treatments appropriate due to intensity of illness or inability to take PO   Dispo: The patient is from: Home              Anticipated d/c is to: SNF              Anticipated d/c date is: 1-2 days              Patient currently is not medically stable to d/c.   DVT Prophylaxis  :  Lovenox   Lab Results  Component Value Date   PLT 159 02/03/2020    Diet :   Diet Order            Diet heart healthy/carb modified Room service appropriate? Yes; Fluid consistency: Thin  Diet effective now               Inpatient Medications Scheduled Meds: . aspirin  81 mg Oral Daily  . carbidopa-levodopa  1 tablet Oral TID  . carvedilol  6.25 mg Oral BID WC  . Chlorhexidine Gluconate Cloth  6 each Topical Daily  . enoxaparin (LOVENOX) injection  40 mg Subcutaneous Q24H  . insulin aspart  0-15 Units Subcutaneous TID AC & HS  . mouth rinse  15 mL Mouth Rinse BID   Continuous Infusions: . cefTRIAXone (ROCEPHIN)  IV 2 g (02/02/20 1745)   PRN Meds:.albuterol, [DISCONTINUED] ondansetron **OR** ondansetron (ZOFRAN) IV  Antibiotics  :   Anti-infectives (From admission, onward)   Start     Dose/Rate Route Frequency Ordered Stop   02/01/20 1600  cefTRIAXone (ROCEPHIN) 2 g in sodium chloride 0.9 % 100 mL IVPB     2 g  200 mL/hr over 30 Minutes Intravenous Every 24 hours 02/01/20 0841     02/01/20 1000  cefTRIAXone (ROCEPHIN) 1 g in sodium chloride 0.9 % 100 mL IVPB  Status:  Discontinued     1 g 200 mL/hr over 30 Minutes Intravenous Every 24 hours 02/01/20 0835 02/01/20 0841   01/31/20 0500  piperacillin-tazobactam (ZOSYN) IVPB 3.375 g  Status:  Discontinued     3.375 g 12.5 mL/hr over 240 Minutes Intravenous Every 8 hours  01/31/20 0459 02/01/20 0833   01/31/20 0015  cefTRIAXone (ROCEPHIN) 1 g in sodium chloride 0.9 % 100 mL IVPB     1 g 200 mL/hr over 30 Minutes Intravenous  Once 01/31/20 0007 01/31/20 0105        Objective:   Vitals:   02/02/20 1751 02/02/20 2050 02/03/20 0419 02/03/20 0859  BP:  (!) 148/82 (!) 138/58 (!) 156/77  Pulse:  92 91 90  Resp:  16 18 18   Temp:  99.1 F (37.3 C) 99 F (37.2 C) 98.5 F (36.9 C)  TempSrc:  Oral Oral Oral  SpO2: 98% 97% 96% 97%  Weight:  74.8 kg    Height:        SpO2: 97 % O2 Flow Rate (L/min): 2 L/min  Wt Readings from Last 3 Encounters:  02/02/20 74.8 kg  08/27/19 74.3 kg  02/24/19 75.8 kg     Intake/Output Summary (Last 24 hours) at 02/03/2020 0934 Last data filed at 02/03/2020 [id number] Gross per 24 hour  Intake 600 ml  Output 500 ml  Net 100 ml     Physical Exam  Awake Alert, No new F.N deficits, Normal affect Merlin.AT,PERRAL Supple Neck,No JVD, No cervical lymphadenopathy appriciated.  Symmetrical Chest wall movement, Good air movement bilaterally, CTAB RRR,No Gallops, Rubs or new Murmurs, No Parasternal Heave +ve B.Sounds, Abd Soft, No tenderness, No organomegaly appriciated, No rebound - guarding or rigidity. No Cyanosis, Clubbing or edema, No new Rash or bruise     Data Review:    Recent Labs  Lab 01/31/20 0007 01/31/20 0506 02/02/20 0500 02/03/20 0444  WBC 14.5* 9.1 9.5 8.5  HGB 12.5 11.4* 10.8* 10.3*  HCT 37.4 34.3* 32.3* 31.0*  PLT 176 151 139* 159  MCV 87.6 88.6 88.3 88.3  MCH 29.3 29.5 29.5 29.3  MCHC 33.4 33.2 33.4 33.2  RDW 12.9 13.1 13.2 13.4  LYMPHSABS 0.6* 0.4* 0.9 1.0  MONOABS 1.7* 0.8 1.1* 0.9  EOSABS 0.0 0.0 0.0 0.1  BASOSABS 0.0 0.0 0.0 0.0    Recent Labs  Lab 01/31/20 0007 01/31/20 0506 01/31/20 0557 02/01/20 0227 02/02/20 0500 02/02/20 1119 02/03/20 0444  NA 136 142  --  142 143  --  144  K 2.7* 3.5  --  3.7 3.3*  --  3.5  CL 101 109  --  113* 113*  --  113*  CO2 21* 19*  --  20* 20*  --   24  GLUCOSE 221* 153*  --  124* 142*  --  136*  BUN 23 22  --  23 21  --  18  CREATININE 1.81* 1.60*  --  1.54* 1.33*  --  1.17*  CALCIUM 8.4* 7.7*  --  7.3* 8.0*  --  8.1*  AST 32 32  --   --   --   --  30  ALT 28 26  --   --   --   --  7  ALKPHOS 45 65  --   --   --   --  75  BILITOT 2.4* 1.9*  --   --   --   --  0.7  ALBUMIN 2.8* 2.3*  --   --   --   --  2.0*  MG  --   --   --  1.7 2.0  --  1.7  PROCALCITON  --  12.36  --   --   --   --   --   INR 1.3* 1.4*  --   --   --   --   --   TSH  --   --   --   --   --  2.102  --   HGBA1C  --   --  6.5*  --   --   --   --   BNP  --   --   --   --  712.9*  --  532.1*    Recent Labs  Lab 01/31/20 0151 01/31/20 0506 02/02/20 0500 02/03/20 0444  BNP  --   --  712.9* 532.1*  PROCALCITON  --  12.36  --   --   SARSCOV2NAA NEGATIVE  --   --   --     ------------------------------------------------------------------------------------------------------------------ No results for input(s): CHOL, HDL, LDLCALC, TRIG, CHOLHDL, LDLDIRECT in the last 72 hours.  Lab Results  Component Value Date   HGBA1C 6.5 (H) 01/31/2020   ------------------------------------------------------------------------------------------------------------------ Recent Labs    02/02/20 1119  TSH 2.102   ------------------------------------------------------------------------------------------------------------------ No results for input(s): VITAMINB12, FOLATE, FERRITIN, TIBC, IRON, RETICCTPCT in the last 72 hours.  Coagulation profile Recent Labs  Lab 01/31/20 0007 01/31/20 0506  INR 1.3* 1.4*    No results for input(s): DDIMER in the last 72 hours.  Cardiac Enzymes No results for input(s): CKMB, TROPONINI, MYOGLOBIN in the last 168 hours.  Invalid input(s): CK ------------------------------------------------------------------------------------------------------------------    Component Value Date/Time   BNP 532.1 (H) 02/03/2020 0444    Micro  Results Recent Results (from the past 240 hour(s))  Blood Culture (routine x 2)     Status: Abnormal   Collection Time: 01/31/20 12:22 AM   Specimen: BLOOD  Result Value Ref Range Status   Specimen Description BLOOD RIGHT ANTECUBITAL  Final   Special Requests   Final    BOTTLES DRAWN AEROBIC AND ANAEROBIC Blood Culture results may not be optimal due to an excessive volume of blood received in culture bottles   Culture  Setup Time   Final    GRAM NEGATIVE RODS IN BOTH AEROBIC AND ANAEROBIC BOTTLES CRITICAL RESULT CALLED TO, READ BACK BY AND VERIFIED WITH: PHATMD L CRRAU LJ:[id number] AT 51 B Y CM Performed at [facility], [place] [address] [place], [place] [address]    Culture ESCHERICHIA COLI (A)  Final   Report Status 02/02/2020 FINAL  Final   Organism ID, Bacteria ESCHERICHIA COLI  Final      Susceptibility   Escherichia coli - MIC*    AMPICILLIN >=32 RESISTANT Resistant     CEFAZOLIN >=64 RESISTANT Resistant     CEFEPIME <=1 SENSITIVE Sensitive     CEFTAZIDIME <=1 SENSITIVE Sensitive     CEFTRIAXONE <=1 SENSITIVE Sensitive     CIPROFLOXACIN <=0.25 SENSITIVE Sensitive     GENTAMICIN <=1 SENSITIVE Sensitive     IMIPENEM <=0.25 SENSITIVE Sensitive     TRIMETH/SULFA <=20 SENSITIVE Sensitive     AMPICILLIN/SULBACTAM >=32 RESISTANT Resistant     PIP/TAZO 64 INTERMEDIATE Intermediate     * ESCHERICHIA COLI  Blood Culture ID Panel (  Reflexed)     Status: Abnormal   Collection Time: 01/31/20 12:22 AM  Result Value Ref Range Status   Enterococcus species NOT DETECTED NOT DETECTED Final   Listeria monocytogenes NOT DETECTED NOT DETECTED Final   Staphylococcus species NOT DETECTED NOT DETECTED Final   Staphylococcus aureus (BCID) NOT DETECTED NOT DETECTED Final   Streptococcus species NOT DETECTED NOT DETECTED Final   Streptococcus agalactiae NOT DETECTED NOT DETECTED Final   Streptococcus pneumoniae NOT DETECTED NOT DETECTED Final   Streptococcus pyogenes NOT DETECTED NOT DETECTED  Final   Acinetobacter baumannii NOT DETECTED NOT DETECTED Final   Enterobacteriaceae species DETECTED (A) NOT DETECTED Final    Comment: Enterobacteriaceae represent a large family of gram-negative bacteria, not a single organism. CRITICAL RESULT CALLED TO, READ BACK BY AND VERIFIED WITH: PHARMD L CRRAU [id number]:[id number] AT 1420 BY CM    Enterobacter cloacae complex NOT DETECTED NOT DETECTED Final   Escherichia coli DETECTED (A) NOT DETECTED Final    Comment: CRITICAL RESULT CALLED TO, READ BACK BY AND VERIFIED WITH: PHARMD L CRRAU [id number]:[id number] AT 1620 BY CM    Klebsiella oxytoca NOT DETECTED NOT DETECTED Final   Klebsiella pneumoniae NOT DETECTED NOT DETECTED Final   Proteus species NOT DETECTED NOT DETECTED Final   Serratia marcescens NOT DETECTED NOT DETECTED Final   Carbapenem resistance NOT DETECTED NOT DETECTED Final   Haemophilus influenzae NOT DETECTED NOT DETECTED Final   Neisseria meningitidis NOT DETECTED NOT DETECTED Final   Pseudomonas aeruginosa NOT DETECTED NOT DETECTED Final   Candida albicans NOT DETECTED NOT DETECTED Final   Candida glabrata NOT DETECTED NOT DETECTED Final   Candida krusei NOT DETECTED NOT DETECTED Final   Candida parapsilosis NOT DETECTED NOT DETECTED Final   Candida tropicalis NOT DETECTED NOT DETECTED Final    Comment: Performed at [facility], [place]. [address], [place], [place] [address]  Blood Culture (routine x 2)     Status: Abnormal   Collection Time: 01/31/20 12:23 AM   Specimen: BLOOD LEFT WRIST  Result Value Ref Range Status   Specimen Description BLOOD LEFT WRIST  Final   Special Requests   Final    BOTTLES DRAWN AEROBIC ONLY Blood Culture adequate volume   Culture  Setup Time   Final    GRAM NEGATIVE RODS AEROBIC BOTTLE ONLY CRITICAL VALUE NOTED.  VALUE IS CONSISTENT WITH PREVIOUSLY REPORTED AND CALLED VALUE.    Culture (A)  Final    ESCHERICHIA COLI SUSCEPTIBILITIES PERFORMED ON PREVIOUS CULTURE WITHIN THE LAST 5 DAYS. Performed at [facility], [place] [address] [place], [place] [address]    Report Status 02/02/2020 FINAL  Final  Urine culture     Status: Abnormal   Collection Time: 01/31/20  1:51 AM   Specimen: Urine, Clean Catch  Result Value Ref Range Status   Specimen Description URINE, CLEAN CATCH  Final   Special Requests   Final    NONE Performed at [facility], [place] [address], [place], [place] [address]    Culture >=100,000 COLONIES/mL ESCHERICHIA COLI (A)  Final   Report Status 02/02/2020 FINAL  Final   Organism ID, Bacteria ESCHERICHIA COLI (A)  Final      Susceptibility   Escherichia coli - MIC*    AMPICILLIN >=32 RESISTANT Resistant     CEFAZOLIN >=64 RESISTANT Resistant     CEFTRIAXONE <=1 SENSITIVE Sensitive     CIPROFLOXACIN <=0.25 SENSITIVE Sensitive     GENTAMICIN <=1 SENSITIVE Sensitive  IMIPENEM <=0.25 SENSITIVE Sensitive     NITROFURANTOIN <=16 SENSITIVE Sensitive     TRIMETH/SULFA <=20 SENSITIVE Sensitive     AMPICILLIN/SULBACTAM >=32 RESISTANT Resistant     PIP/TAZO 16 SENSITIVE Sensitive     * >=100,000 COLONIES/mL ESCHERICHIA COLI  SARS Coronavirus 2 by RT PCR (hospital order, performed in [facility] hospital lab) Nasopharyngeal Nasopharyngeal Swab     Status: None   Collection Time: 01/31/20  1:51 AM   Specimen: Nasopharyngeal Swab  Result Value Ref Range Status   SARS Coronavirus 2 NEGATIVE NEGATIVE Final    Comment: (NOTE) SARS-CoV-2 target nucleic acids are NOT DETECTED. The SARS-CoV-2 RNA is generally detectable in upper and lower respiratory specimens during the acute phase of infection. The lowest concentration of SARS-CoV-2 viral copies this assay can detect is 250 copies / mL. A negative result does not preclude SARS-CoV-2 infection and should not be used as the sole basis for treatment or other patient management decisions.  A negative result may occur with improper specimen collection / handling, submission of specimen other than nasopharyngeal swab,  presence of viral mutation(s) within the areas targeted by this assay, and inadequate number of viral copies (<250 copies / mL). A negative result must be combined with clinical observations, patient history, and epidemiological information. Fact Sheet for Patients:   StrictlyIdeas.no Fact Sheet for Healthcare Providers: BankingDealers.co.za This test is not yet approved or cleared  by the [place] FDA and has been authorized for detection and/or diagnosis of SARS-CoV-2 by FDA under an Emergency Use Authorization (EUA).  This EUA will remain in effect (meaning this test can be used) for the duration of the COVID-19 declaration under Section 564(b)(1) of the Act, 21 U.S.C. section 360bbb-3(b)(1), unless the authorization is terminated or revoked sooner. Performed at [facility], [place] [address] [place], [place] [address]   MRSA PCR Screening     Status: None   Collection Time: 01/31/20  4:35 PM   Specimen: Nasopharyngeal  Result Value Ref Range Status   MRSA by PCR NEGATIVE NEGATIVE Final    Comment:        The GeneXpert MRSA Assay (FDA approved for NASAL specimens only), is one component of a comprehensive MRSA colonization surveillance program. It is not intended to diagnose MRSA infection nor to guide or monitor treatment for MRSA infections. Performed at [facility], [place] [address], [place], [place] [address]     Radiology Reports CT ABDOMEN PELVIS WO CONTRAST  Result Date: 01/31/2020 CLINICAL DATA:  Sepsis, complicated UTI. Evaluate for pyelonephritis or infected stone. EXAM: CT ABDOMEN AND PELVIS WITHOUT CONTRAST TECHNIQUE: Multidetector CT imaging of the abdomen and pelvis was performed following the standard protocol without IV contrast. COMPARISON:  CT abdomen dated 01/04/2010. FINDINGS: Lower chest: No acute abnormality. Hepatobiliary: Peripheral liver contours are slightly nodular suggesting cirrhosis.  No focal liver abnormality is seen. Gallbladder is unremarkable. No bile duct dilatation seen. Pancreas: Unremarkable. No pancreatic ductal dilatation or surrounding inflammatory changes. Spleen: Normal in size without focal abnormality. Adrenals/Urinary Tract: Adrenal glands appear normal. Kidneys are unremarkable without mass, stone or hydronephrosis. No ureteral or bladder calculi are identified. Bladder is unremarkable, partially decompressed. Stomach/Bowel: No dilated large or small bowel loops. No evidence of bowel wall inflammation. Appendix is not seen but there are no inflammatory changes about the cecum to suggest acute appendicitis. Stomach is unremarkable, decompressed. Vascular/Lymphatic: Aortic atherosclerosis. No enlarged lymph nodes seen. Reproductive: Presumed hysterectomy. No adnexal mass or free fluid. Other: No  free fluid or abscess collection seen. No free intraperitoneal air. Musculoskeletal: Mild degenerative spondylosis of the slightly scoliotic thoracolumbar spine. Superficial soft tissues are unremarkable. IMPRESSION: 1. No acute findings within the abdomen or pelvis. No bowel obstruction or evidence of bowel wall inflammation. No renal or ureteral calculi. No evidence of pyelonephritis. 2. Cirrhotic appearing liver. Aortic Atherosclerosis (ICD10-I70.0). Electronically Signed   By: Franki Cabot M.D.   On: 01/31/2020 05:45   DG CHEST PORT 1 VIEW  Result Date: 01/31/2020 CLINICAL DATA:  Desaturation EXAM: PORTABLE CHEST 1 VIEW COMPARISON:  Radiograph 01/31/2020 FINDINGS: Chronically coarsened basilar interstitial changes and bronchitic features are similar to comparison studies likely accentuated by some basilar atelectasis. Apical lucency compatible with emphysematous changes seen on prior cross-sectional imaging. No consolidation, features of edema, pneumothorax, or effusion. The aorta is calcified. The remaining cardiomediastinal contours are unremarkable. No acute osseous or soft  tissue abnormality. Degenerative changes are present in the imaged spine and shoulders. Mild levocurvature of the spine. IMPRESSION: 1. Mild atelectasis, otherwise no acute cardiopulmonary abnormality. 2. Chronically coarsened basilar interstitial changes and bronchitic features are similar to comparison studies. 3. 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 Electronically Signed   By: Lovena Le M.D.   On: 01/31/2020 15:45   DG Chest Port 1 View  Result Date: 01/31/2020 CLINICAL DATA:  New atrial flutter. Fever and syncope. EXAM: PORTABLE CHEST 1 VIEW COMPARISON:  Chest x-rays dated 01/31/2020 and 10/11/2010. FINDINGS: Heart size and mediastinal contours are within normal limits. Lungs are clear. No pleural effusion or pneumothorax is seen. Osseous structures about the chest are unremarkable. IMPRESSION: No active disease. No evidence of pneumonia or pulmonary edema. Electronically Signed   By: Franki Cabot M.D.   On: 01/31/2020 04:26   DG Chest Port 1 View  Result Date: 01/31/2020 CLINICAL DATA:  Sepsis EXAM: PORTABLE CHEST 1 VIEW COMPARISON:  None. FINDINGS: The heart size and mediastinal contours are within normal limits. Both lungs are clear. The visualized skeletal structures are unremarkable. IMPRESSION: No active disease. Electronically Signed   By: Prudencio Pair M.D.   On: 01/31/2020 00:39   ECHOCARDIOGRAM COMPLETE  Result Date: 01/31/2020    ECHOCARDIOGRAM REPORT   Patient Name:   [name] Date of Exam: 01/31/2020 Medical Rec #:  [id number]          Height:       64.0 in Accession #:    [id number]         Weight:       162.0 lb Date of Birth:  1941-09-05          BSA:          1.789 m Patient Age:    30 years           BP:           106/51 mmHg Patient Gender: F                  HR:           91 bpm. Exam Location:  Inpatient Procedure: 2D Echo, Cardiac Doppler and Color Doppler Indications:    I48.91* Unspeicified atrial fibrillation  History:        Patient has prior history of  Echocardiogram examinations, most                 recent 09/01/2008.  Sonographer:    Merrie Roof RDCS Referring Phys: [place]  1. Left ventricular ejection fraction, by estimation,  is 60 to 65%. The left ventricle has normal function. The left ventricle has no regional wall motion abnormalities. Left ventricular diastolic parameters were normal.  2. Right ventricular systolic function is normal. The right ventricular size is normal.  3. Left atrial size was mildly dilated.  4. The mitral valve is degenerative. Trivial mitral valve regurgitation. No evidence of mitral stenosis.  5. The aortic valve is tricuspid. Aortic valve regurgitation is not visualized. Mild to moderate aortic valve sclerosis/calcification is present, without any evidence of aortic stenosis.  6. The inferior vena cava is normal in size with greater than 50% respiratory variability, suggesting right atrial pressure of 3 mmHg. FINDINGS  Left Ventricle: Left ventricular ejection fraction, by estimation, is 60 to 65%. The left ventricle has normal function. The left ventricle has no regional wall motion abnormalities. The left ventricular internal cavity size was normal in size. There is  no left ventricular hypertrophy. Left ventricular diastolic parameters were normal. Right Ventricle: The right ventricular size is normal. No increase in right ventricular wall thickness. Right ventricular systolic function is normal. Left Atrium: Left atrial size was mildly dilated. Right Atrium: Right atrial size was normal in size. Pericardium: There is no evidence of pericardial effusion. Mitral Valve: The mitral valve is degenerative in appearance. There is mild thickening of the mitral valve leaflet(s). There is mild calcification of the mitral valve leaflet(s). Normal mobility of the mitral valve leaflets. Moderate mitral annular calcification. Trivial mitral valve regurgitation. No evidence of mitral valve stenosis. Tricuspid Valve: The  tricuspid valve is normal in structure. Tricuspid valve regurgitation is trivial. No evidence of tricuspid stenosis. Aortic Valve: The aortic valve is tricuspid. Aortic valve regurgitation is not visualized. Mild to moderate aortic valve sclerosis/calcification is present, without any evidence of aortic stenosis. Pulmonic Valve: The pulmonic valve was normal in structure. Pulmonic valve regurgitation is not visualized. No evidence of pulmonic stenosis. Aorta: The aortic root is normal in size and structure. Venous: The inferior vena cava is normal in size with greater than 50% respiratory variability, suggesting right atrial pressure of 3 mmHg. IAS/Shunts: No atrial level shunt detected by color flow Doppler.  LEFT VENTRICLE PLAX 2D LVIDd:         3.80 cm     Diastology LVIDs:         2.50 cm     LV e' lateral:   8.81 cm/s LV PW:         0.90 cm     LV E/e' lateral: 10.9 LV IVS:        1.00 cm     LV e' medial:    7.07 cm/s                            LV E/e' medial:  13.6  LV Volumes (MOD) LV vol d, MOD A4C: 46.5 ml LV vol s, MOD A4C: 17.7 ml LV SV MOD A4C:     46.5 ml RIGHT VENTRICLE             IVC RV Basal diam:  2.70 cm     IVC diam: 2.10 cm RV S prime:     10.10 cm/s TAPSE (M-mode): 1.9 cm LEFT ATRIUM           Index       RIGHT ATRIUM           Index LA diam:      4.10 cm 2.29 cm/m  RA Area:     10.80 cm LA Vol (A2C): 63.2 ml 35.33 ml/m RA Volume:   22.70 ml  12.69 ml/m LA Vol (A4C): 60.7 ml 33.93 ml/m  AORTIC VALVE LVOT Vmax:   90.20 cm/s LVOT Vmean:  60.700 cm/s LVOT VTI:    0.187 m  AORTA Ao Root diam: 3.00 cm Ao Asc diam:  2.90 cm MITRAL VALVE MV Area (PHT): 5.27 cm     SHUNTS MV Decel Time: 144 msec     Systemic VTI: 0.19 m MV E velocity: 96.00 cm/s MV A velocity: 125.00 cm/s MV E/A ratio:  0.77 Jenkins Rouge MD Electronically signed by Jenkins Rouge MD Signature Date/Time: 01/31/2020/2:20:10 PM    Final     Time Spent in minutes  30   Lala Lund M.D on 02/03/2020 at 9:34 AM  To page go to  [email or web address] - password [email or web address]

## 2020-02-04 DIAGNOSIS — A4150 Gram-negative sepsis, unspecified: Secondary | ICD-10-CM

## 2020-02-04 DIAGNOSIS — N183 Chronic kidney disease, stage 3 unspecified: Secondary | ICD-10-CM

## 2020-02-04 DIAGNOSIS — N390 Urinary tract infection, site not specified: Secondary | ICD-10-CM

## 2020-02-04 DIAGNOSIS — E1122 Type 2 diabetes mellitus with diabetic chronic kidney disease: Secondary | ICD-10-CM

## 2020-02-04 DIAGNOSIS — N179 Acute kidney failure, unspecified: Secondary | ICD-10-CM

## 2020-02-04 DIAGNOSIS — R6520 Severe sepsis without septic shock: Secondary | ICD-10-CM

## 2020-02-04 DIAGNOSIS — R259 Unspecified abnormal involuntary movements: Secondary | ICD-10-CM

## 2020-02-04 LAB — BRAIN NATRIURETIC PEPTIDE: B Natriuretic Peptide: 580.1 pg/mL — ABNORMAL HIGH (ref 0.0–100.0)

## 2020-02-04 LAB — CBC WITH DIFFERENTIAL/PLATELET
Abs Immature Granulocytes: 0.34 10*3/uL — ABNORMAL HIGH (ref 0.00–0.07)
Basophils Absolute: 0 10*3/uL (ref 0.0–0.1)
Basophils Relative: 0 %
Eosinophils Absolute: 0.1 10*3/uL (ref 0.0–0.5)
Eosinophils Relative: 1 %
HCT: 34.4 % — ABNORMAL LOW (ref 36.0–46.0)
Hemoglobin: 11.4 g/dL — ABNORMAL LOW (ref 12.0–15.0)
Immature Granulocytes: 4 %
Lymphocytes Relative: 10 %
Lymphs Abs: 1 10*3/uL (ref 0.7–4.0)
MCH: 29.5 pg (ref 26.0–34.0)
MCHC: 33.1 g/dL (ref 30.0–36.0)
MCV: 89.1 fL (ref 80.0–100.0)
Monocytes Absolute: 0.8 10*3/uL (ref 0.1–1.0)
Monocytes Relative: 9 %
Neutro Abs: 7 10*3/uL (ref 1.7–7.7)
Neutrophils Relative %: 76 %
Platelets: 193 10*3/uL (ref 150–400)
RBC: 3.86 MIL/uL — ABNORMAL LOW (ref 3.87–5.11)
RDW: 13.3 % (ref 11.5–15.5)
WBC: 9.3 10*3/uL (ref 4.0–10.5)
nRBC: 0 % (ref 0.0–0.2)

## 2020-02-04 LAB — GLUCOSE, CAPILLARY
Glucose-Capillary: 124 mg/dL — ABNORMAL HIGH (ref 70–99)
Glucose-Capillary: 117 mg/dL — ABNORMAL HIGH (ref 70–99)
Glucose-Capillary: 108 mg/dL — ABNORMAL HIGH (ref 70–99)
Glucose-Capillary: 117 mg/dL — ABNORMAL HIGH (ref 70–99)

## 2020-02-04 LAB — COMPREHENSIVE METABOLIC PANEL
ALT: 8 U/L (ref 0–44)
AST: 52 U/L — ABNORMAL HIGH (ref 15–41)
Albumin: 2.2 g/dL — ABNORMAL LOW (ref 3.5–5.0)
Alkaline Phosphatase: 75 U/L (ref 38–126)
Anion gap: 11 (ref 5–15)
BUN: 16 mg/dL (ref 8–23)
CO2: 24 mmol/L (ref 22–32)
Calcium: 8.5 mg/dL — ABNORMAL LOW (ref 8.9–10.3)
Chloride: 110 mmol/L (ref 98–111)
Creatinine, Ser: 1.1 mg/dL — ABNORMAL HIGH (ref 0.44–1.00)
GFR calc Af Amer: 56 mL/min — ABNORMAL LOW (ref >60)
GFR calc non Af Amer: 48 mL/min — ABNORMAL LOW (ref >60)
Glucose, Bld: 134 mg/dL — ABNORMAL HIGH (ref 70–99)
Potassium: 3.9 mmol/L (ref 3.5–5.1)
Sodium: 145 mmol/L (ref 135–145)
Total Bilirubin: 0.9 mg/dL (ref 0.3–1.2)
Total Protein: 5.8 g/dL — ABNORMAL LOW (ref 6.5–8.1)

## 2020-02-04 LAB — MAGNESIUM: Magnesium: 1.8 mg/dL (ref 1.7–2.4)

## 2020-02-04 NOTE — Progress Notes (Signed)
PROGRESS NOTE                                                                                                                                                                                                             Patient Demographics:    [name], is a 79 y.o. female, DOB - [birth year], [id number]  Admit date - 01/30/2020   Admitting Physician Chesley Mires, MD  Outpatient Primary MD for the patient is Binnie Rail, MD  LOS - 4  Chief Complaint  Patient presents with  . Fever       Brief Narrative - 79 year old Caucasian female history of DM type II, hypertension, CKD 3, diabetic peripheral neuropathy, past history of breast cancer, IBS, GERD and Gilbert's syndrome who was admitted to the hospital for sepsis arising from UTI, was under ICU care, briefly required pressors, now stabilized and transferred to hospitalist service on 02/02/2020.   Subjective:    [name] today denies headache, No chest pain, No abdominal pain - No Nausea, No new weakness tingling or numbness, No Cough - SOB.    Assessment  & Plan :     1. Sepsis due to E.Coli UTI and bacteremia- was in ICU and required pressors briefly, with supportive care has sepsis pathophysiology seems to have resolved, E. coli seems to be pansensitive and for now continue IV Rocephin. Ordered repeat blood cultures.  2.  Brief run of atrial fibrillation in ICU while she was on pressor.  Per ICU MD Dr. Lynetta Mare resolved after a brief run, her [place] vas 2 score certainly is greater than 3 however this seems to be in the setting of a reversible cause, echo is stable with preserved EF and no wall motion abnormality, she has no history of stroke, TSH normal.  For now aspirin 81 mg along with beta-blocker.  Upon discharge will do a 30-day event monitor and follow-up with cardiology, if there is further recurrence of atrial fibrillation then certainly she requires full anticoagulation and further work-up.  3.   Generalized weakness.  Due to #1 above.  PT OT eval, patient agreeable for SNF bed awaited.  4.  Possible cirrhosis on her CT scan.  LFTs remain stable, total bili is high but she carries a diagnosis of Gilbert's syndrome, INR was borderline at 1.4, will defer further work-up to PCP outpatient she may benefit from GI evaluation post discharge.  Question if she has underlying NASH.  5.  History of Parkinson's.  Continue combination of  carbidopa levodopa.  6.  Essential hypertension.  Placed on Coreg.  Discontinue Norvasc.  7.  AKI on CKD 2.  Stable.  Baseline creatinine around 1.2.  Trend is improving and almost at baseline now.  8.  DM type II.  On Glucophage at home, sliding scale continued.  Continue to monitor Accu-Cheks and adjust medications as needed.  Lab Results  Component Value Date   HGBA1C 6.5 (H) 01/31/2020   CBG (last 3)  Recent Labs    02/04/20 0644 02/04/20 1150 02/04/20 1619  GLUCAP 124* 117* 108*      Condition - Fair  Family Communication  : Husband Samuel at bedside.  Code Status :  Full  Consults  :  PCCM  Procedures  :  None  CT -  1. No acute findings within the abdomen or pelvis. No bowel obstruction or evidence of bowel wall inflammation. No renal or ureteral calculi. No evidence of pyelonephritis. 2. Cirrhotic appearing liver. Aortic Atherosclerosis  TTE  1. Left ventricular ejection fraction, by estimation, is 60 to 65%. The left ventricle has normal function. The left ventricle has no regional wall motion abnormalities. Left ventricular diastolic parameters were normal.  2. Right ventricular systolic function is normal. The right ventricular size is normal.  3. Left atrial size was mildly dilated.  4. The mitral valve is degenerative. Trivial mitral valve regurgitation. No evidence of mitral stenosis.  5. The aortic valve is tricuspid. Aortic valve regurgitation is not visualized. Mild to moderate aortic valve sclerosis/calcification is  present, without any evidence of aortic stenosis.  6. The inferior vena cava is normal in size with greater than 50% respiratory variability, suggesting right atrial pressure of 3 mmHg.   PUD Prophylaxis : Lovenox  Disposition Plan  :  SNF  Status is: Inpatient  Remains inpatient appropriate because:IV treatments appropriate due to intensity of illness or inability to take PO   Dispo: The patient is from: Home              Anticipated d/c is to: SNF              Anticipated d/c date is: 1-2 days              Patient currently is not medically stable to d/c.   DVT Prophylaxis  :  Lovenox   Lab Results  Component Value Date   PLT 193 02/04/2020    Diet :   Diet Order            Diet heart healthy/carb modified Room service appropriate? Yes; Fluid consistency: Thin  Diet effective now               Inpatient Medications Scheduled Meds: . aspirin  81 mg Oral Daily  . carbidopa-levodopa  1 tablet Oral TID  . carvedilol  6.25 mg Oral BID WC  . Chlorhexidine Gluconate Cloth  6 each Topical Daily  . enoxaparin (LOVENOX) injection  40 mg Subcutaneous Q24H  . insulin aspart  0-15 Units Subcutaneous TID AC & HS  . mouth rinse  15 mL Mouth Rinse BID   Continuous Infusions: . cefTRIAXone (ROCEPHIN)  IV 2 g (02/04/20 1703)   PRN Meds:.albuterol, [DISCONTINUED] ondansetron **OR** ondansetron (ZOFRAN) IV  Antibiotics  :   Anti-infectives (From admission, onward)   Start     Dose/Rate Route Frequency Ordered Stop   02/01/20 1600  cefTRIAXone (ROCEPHIN) 2 g in sodium chloride 0.9 % 100 mL IVPB     2 g  200 mL/hr over 30 Minutes Intravenous Every 24 hours 02/01/20 0841     02/01/20 1000  cefTRIAXone (ROCEPHIN) 1 g in sodium chloride 0.9 % 100 mL IVPB  Status:  Discontinued     1 g 200 mL/hr over 30 Minutes Intravenous Every 24 hours 02/01/20 0835 02/01/20 0841   01/31/20 0500  piperacillin-tazobactam (ZOSYN) IVPB 3.375 g  Status:  Discontinued     3.375 g 12.5 mL/hr over  240 Minutes Intravenous Every 8 hours 01/31/20 0459 02/01/20 0833   01/31/20 0015  cefTRIAXone (ROCEPHIN) 1 g in sodium chloride 0.9 % 100 mL IVPB     1 g 200 mL/hr over 30 Minutes Intravenous  Once 01/31/20 0007 01/31/20 0105        Objective:   Vitals:   02/03/20 2054 02/04/20 0433 02/04/20 0951 02/04/20 1620  BP: (!) 149/72 (!) 168/78 129/74 (!) 142/64  Pulse: 82 96 82 79  Resp: 16 18 18 18   Temp: 98.1 F (36.7 C) 99 F (37.2 C) 98.8 F (37.1 C) 98.5 F (36.9 C)  TempSrc: Oral Oral Oral Oral  SpO2: 96% 94% 98% 98%  Weight: 74 kg     Height:        SpO2: 98 % O2 Flow Rate (L/min): 2 L/min  Wt Readings from Last 3 Encounters:  02/03/20 74 kg  08/27/19 74.3 kg  02/24/19 75.8 kg     Intake/Output Summary (Last 24 hours) at 02/04/2020 1720 Last data filed at 02/04/2020 1300 Gross per 24 hour  Intake 360 ml  Output 900 ml  Net -540 ml     Physical Exam  Awake Alert, No new F.N deficits, Normal affect [place].AT,PERRAL Supple Neck,No JVD, No cervical lymphadenopathy appriciated.  Symmetrical Chest wall movement, Good air movement bilaterally, CTAB RRR,No Gallops, Rubs or new Murmurs, No Parasternal Heave Positive bowel sounds, Abd Soft, No tenderness, No organomegaly appriciated, No rebound - guarding or rigidity. No Cyanosis, Clubbing or edema, No new Rash or bruise     Data Review:    Recent Labs  Lab 01/31/20 0007 01/31/20 0506 02/02/20 0500 02/03/20 0444 02/04/20 0457  WBC 14.5* 9.1 9.5 8.5 9.3  HGB 12.5 11.4* 10.8* 10.3* 11.4*  HCT 37.4 34.3* 32.3* 31.0* 34.4*  PLT 176 151 139* 159 193  MCV 87.6 88.6 88.3 88.3 89.1  MCH 29.3 29.5 29.5 29.3 29.5  MCHC 33.4 33.2 33.4 33.2 33.1  RDW 12.9 13.1 13.2 13.4 13.3  LYMPHSABS 0.6* 0.4* 0.9 1.0 1.0  MONOABS 1.7* 0.8 1.1* 0.9 0.8  EOSABS 0.0 0.0 0.0 0.1 0.1  BASOSABS 0.0 0.0 0.0 0.0 0.0    Recent Labs  Lab 01/31/20 0007 01/31/20 0007 01/31/20 0506 01/31/20 0557 02/01/20 0227 02/02/20 0500  02/02/20 1119 02/03/20 0444 02/04/20 0457  NA 136   < > 142  --  142 143  --  144 145  K 2.7*   < > 3.5  --  3.7 3.3*  --  3.5 3.9  CL 101   < > 109  --  113* 113*  --  113* 110  CO2 21*   < > 19*  --  20* 20*  --  24 24  GLUCOSE 221*   < > 153*  --  124* 142*  --  136* 134*  BUN 23   < > 22  --  23 21  --  18 16  CREATININE 1.81*   < > 1.60*  --  1.54* 1.33*  --  1.17* 1.10*  CALCIUM 8.4*   < > 7.7*  --  7.3* 8.0*  --  8.1* 8.5*  AST 32  --  32  --   --   --   --  30 52*  ALT 28  --  26  --   --   --   --  7 8  ALKPHOS 45  --  65  --   --   --   --  55 75  BILITOT 2.4*  --  1.9*  --   --   --   --  0.7 0.9  ALBUMIN 2.8*  --  2.3*  --   --   --   --  2.0* 2.2*  MG  --   --   --   --  1.7 2.0  --  1.7 1.8  PROCALCITON  --   --  12.36  --   --   --   --   --   --   INR 1.3*  --  1.4*  --   --   --   --   --   --   TSH  --   --   --   --   --   --  2.102  --   --   HGBA1C  --   --   --  6.5*  --   --   --   --   --   BNP  --   --   --   --   --  712.9*  --  532.1* 580.1*   < > = values in this interval not displayed.    Recent Labs  Lab 01/31/20 0151 01/31/20 0506 02/02/20 0500 02/03/20 0444 02/04/20 0457  BNP  --   --  712.9* 532.1* 580.1*  PROCALCITON  --  12.36  --   --   --   SARSCOV2NAA NEGATIVE  --   --   --   --     ------------------------------------------------------------------------------------------------------------------ No results for input(s): CHOL, HDL, LDLCALC, TRIG, CHOLHDL, LDLDIRECT in the last 72 hours.  Lab Results  Component Value Date   HGBA1C 6.5 (H) 01/31/2020   ------------------------------------------------------------------------------------------------------------------ Recent Labs    02/02/20 1119  TSH 2.102   ------------------------------------------------------------------------------------------------------------------ No results for input(s): VITAMINB12, FOLATE, FERRITIN, TIBC, IRON, RETICCTPCT in the last 72 hours.  Coagulation  profile Recent Labs  Lab 01/31/20 0007 01/31/20 0506  INR 1.3* 1.4*    No results for input(s): DDIMER in the last 72 hours.  Cardiac Enzymes No results for input(s): CKMB, TROPONINI, MYOGLOBIN in the last 168 hours.  Invalid input(s): CK ------------------------------------------------------------------------------------------------------------------    Component Value Date/Time   BNP 580.1 (H) 02/04/2020 0457    Micro Results Recent Results (from the past 240 hour(s))  Blood Culture (routine x 2)     Status: Abnormal   Collection Time: 01/31/20 12:22 AM   Specimen: BLOOD  Result Value Ref Range Status   Specimen Description BLOOD RIGHT ANTECUBITAL  Final   Special Requests   Final    BOTTLES DRAWN AEROBIC AND ANAEROBIC Blood Culture results may not be optimal due to an excessive volume of blood received in culture bottles   Culture  Setup Time   Final    GRAM NEGATIVE RODS IN BOTH AEROBIC AND ANAEROBIC BOTTLES CRITICAL RESULT CALLED TO, READ BACK BY AND VERIFIED WITH: PHATMD L CRRAU [id number]:[id number] AT 51 B Y CM Performed at [facility], [place] [address],  [place] [address]    Culture ESCHERICHIA COLI (A)  Final   Report Status 02/02/2020 FINAL  Final   Organism ID, Bacteria ESCHERICHIA COLI  Final      Susceptibility   Escherichia coli - MIC*    AMPICILLIN >=32 RESISTANT Resistant     CEFAZOLIN >=64 RESISTANT Resistant     CEFEPIME <=1 SENSITIVE Sensitive     CEFTAZIDIME <=1 SENSITIVE Sensitive     CEFTRIAXONE <=1 SENSITIVE Sensitive     CIPROFLOXACIN <=0.25 SENSITIVE Sensitive     GENTAMICIN <=1 SENSITIVE Sensitive     IMIPENEM <=0.25 SENSITIVE Sensitive     TRIMETH/SULFA <=20 SENSITIVE Sensitive     AMPICILLIN/SULBACTAM >=32 RESISTANT Resistant     PIP/TAZO 64 INTERMEDIATE Intermediate     * ESCHERICHIA COLI  Blood Culture ID Panel (Reflexed)     Status: Abnormal   Collection Time: 01/31/20 12:22 AM  Result Value Ref Range Status   Enterococcus species  NOT DETECTED NOT DETECTED Final   Listeria monocytogenes NOT DETECTED NOT DETECTED Final   Staphylococcus species NOT DETECTED NOT DETECTED Final   Staphylococcus aureus (BCID) NOT DETECTED NOT DETECTED Final   Streptococcus species NOT DETECTED NOT DETECTED Final   Streptococcus agalactiae NOT DETECTED NOT DETECTED Final   Streptococcus pneumoniae NOT DETECTED NOT DETECTED Final   Streptococcus pyogenes NOT DETECTED NOT DETECTED Final   Acinetobacter baumannii NOT DETECTED NOT DETECTED Final   Enterobacteriaceae species DETECTED (A) NOT DETECTED Final    Comment: Enterobacteriaceae represent a large family of gram-negative bacteria, not a single organism. CRITICAL RESULT CALLED TO, READ BACK BY AND VERIFIED WITH: PHARMD L CRRAU [id number] AT 1420 BY CM    Enterobacter cloacae complex NOT DETECTED NOT DETECTED Final   Escherichia coli DETECTED (A) NOT DETECTED Final    Comment: CRITICAL RESULT CALLED TO, READ BACK BY AND VERIFIED WITH: PHARMD L CRRAU [id number] AT 1620 BY CM    Klebsiella oxytoca NOT DETECTED NOT DETECTED Final   Klebsiella pneumoniae NOT DETECTED NOT DETECTED Final   Proteus species NOT DETECTED NOT DETECTED Final   Serratia marcescens NOT DETECTED NOT DETECTED Final   Carbapenem resistance NOT DETECTED NOT DETECTED Final   Haemophilus influenzae NOT DETECTED NOT DETECTED Final   Neisseria meningitidis NOT DETECTED NOT DETECTED Final   Pseudomonas aeruginosa NOT DETECTED NOT DETECTED Final   Candida albicans NOT DETECTED NOT DETECTED Final   Candida glabrata NOT DETECTED NOT DETECTED Final   Candida krusei NOT DETECTED NOT DETECTED Final   Candida parapsilosis NOT DETECTED NOT DETECTED Final   Candida tropicalis NOT DETECTED NOT DETECTED Final    Comment: Performed at [facility], [place]. [address], [place], [place] [address]  Blood Culture (routine x 2)     Status: Abnormal   Collection Time: 01/31/20 12:23 AM   Specimen: BLOOD LEFT WRIST  Result Value Ref Range  Status   Specimen Description BLOOD LEFT WRIST  Final   Special Requests   Final    BOTTLES DRAWN AEROBIC ONLY Blood Culture adequate volume   Culture  Setup Time   Final    GRAM NEGATIVE RODS AEROBIC BOTTLE ONLY CRITICAL VALUE NOTED.  VALUE IS CONSISTENT WITH PREVIOUSLY REPORTED AND CALLED VALUE.    Culture (A)  Final    ESCHERICHIA COLI SUSCEPTIBILITIES PERFORMED ON PREVIOUS CULTURE WITHIN THE LAST 5 DAYS. Performed at [facility], [place] [address] [place], [place] [address]    Report Status 02/02/2020 FINAL  Final  Urine culture  Status: Abnormal   Collection Time: 01/31/20  1:51 AM   Specimen: Urine, Clean Catch  Result Value Ref Range Status   Specimen Description URINE, CLEAN CATCH  Final   Special Requests   Final    NONE Performed at [facility], [address], [place], [place] [address]    Culture >=100,000 COLONIES/mL ESCHERICHIA COLI (A)  Final   Report Status 02/02/2020 FINAL  Final   Organism ID, Bacteria ESCHERICHIA COLI (A)  Final      Susceptibility   Escherichia coli - MIC*    AMPICILLIN >=32 RESISTANT Resistant     CEFAZOLIN >=64 RESISTANT Resistant     CEFTRIAXONE <=1 SENSITIVE Sensitive     CIPROFLOXACIN <=0.25 SENSITIVE Sensitive     GENTAMICIN <=1 SENSITIVE Sensitive     IMIPENEM <=0.25 SENSITIVE Sensitive     NITROFURANTOIN <=16 SENSITIVE Sensitive     TRIMETH/SULFA <=20 SENSITIVE Sensitive     AMPICILLIN/SULBACTAM >=32 RESISTANT Resistant     PIP/TAZO 16 SENSITIVE Sensitive     * >=100,000 COLONIES/mL ESCHERICHIA COLI  SARS Coronavirus 2 by RT PCR (hospital order, performed in [facility] hospital lab) Nasopharyngeal Nasopharyngeal Swab     Status: None   Collection Time: 01/31/20  1:51 AM   Specimen: Nasopharyngeal Swab  Result Value Ref Range Status   SARS Coronavirus 2 NEGATIVE NEGATIVE Final    Comment: (NOTE) SARS-CoV-2 target nucleic acids are NOT DETECTED. The SARS-CoV-2 RNA is generally detectable in upper and  lower respiratory specimens during the acute phase of infection. The lowest concentration of SARS-CoV-2 viral copies this assay can detect is 250 copies / mL. A negative result does not preclude SARS-CoV-2 infection and should not be used as the sole basis for treatment or other patient management decisions.  A negative result may occur with improper specimen collection / handling, submission of specimen other than nasopharyngeal swab, presence of viral mutation(s) within the areas targeted by this assay, and inadequate number of viral copies (<250 copies / mL). A negative result must be combined with clinical observations, patient history, and epidemiological information. Fact Sheet for Patients:   StrictlyIdeas.no Fact Sheet for Healthcare Providers: BankingDealers.co.za This test is not yet approved or cleared  by the [place] FDA and has been authorized for detection and/or diagnosis of SARS-CoV-2 by FDA under an Emergency Use Authorization (EUA).  This EUA will remain in effect (meaning this test can be used) for the duration of the COVID-19 declaration under Section 564(b)(1) of the Act, 21 U.S.C. section 360bbb-3(b)(1), unless the authorization is terminated or revoked sooner. Performed at [facility], [place] [address], [place], [place] [address]   MRSA PCR Screening     Status: None   Collection Time: 01/31/20  4:35 PM   Specimen: Nasopharyngeal  Result Value Ref Range Status   MRSA by PCR NEGATIVE NEGATIVE Final    Comment:        The GeneXpert MRSA Assay (FDA approved for NASAL specimens only), is one component of a comprehensive MRSA colonization surveillance program. It is not intended to diagnose MRSA infection nor to guide or monitor treatment for MRSA infections. Performed at [facility], [place] [address] [place], [place] [address]     Radiology Reports CT ABDOMEN PELVIS WO CONTRAST  Result  Date: 01/31/2020 CLINICAL DATA:  Sepsis, complicated UTI. Evaluate for pyelonephritis or infected stone. EXAM: CT ABDOMEN AND PELVIS WITHOUT CONTRAST TECHNIQUE: Multidetector CT imaging of the abdomen and pelvis was performed  following the standard protocol without IV contrast. COMPARISON:  CT abdomen dated 01/04/2010. FINDINGS: Lower chest: No acute abnormality. Hepatobiliary: Peripheral liver contours are slightly nodular suggesting cirrhosis. No focal liver abnormality is seen. Gallbladder is unremarkable. No bile duct dilatation seen. Pancreas: Unremarkable. No pancreatic ductal dilatation or surrounding inflammatory changes. Spleen: Normal in size without focal abnormality. Adrenals/Urinary Tract: Adrenal glands appear normal. Kidneys are unremarkable without mass, stone or hydronephrosis. No ureteral or bladder calculi are identified. Bladder is unremarkable, partially decompressed. Stomach/Bowel: No dilated large or small bowel loops. No evidence of bowel wall inflammation. Appendix is not seen but there are no inflammatory changes about the cecum to suggest acute appendicitis. Stomach is unremarkable, decompressed. Vascular/Lymphatic: Aortic atherosclerosis. No enlarged lymph nodes seen. Reproductive: Presumed hysterectomy. No adnexal mass or free fluid. Other: No free fluid or abscess collection seen. No free intraperitoneal air. Musculoskeletal: Mild degenerative spondylosis of the slightly scoliotic thoracolumbar spine. Superficial soft tissues are unremarkable. IMPRESSION: 1. No acute findings within the abdomen or pelvis. No bowel obstruction or evidence of bowel wall inflammation. No renal or ureteral calculi. No evidence of pyelonephritis. 2. Cirrhotic appearing liver. Aortic Atherosclerosis (ICD10-I70.0). Electronically Signed   By: Franki Cabot M.D.   On: 01/31/2020 05:45   DG CHEST PORT 1 VIEW  Result Date: 01/31/2020 CLINICAL DATA:  Desaturation EXAM: PORTABLE CHEST 1 VIEW COMPARISON:   Radiograph 01/31/2020 FINDINGS: Chronically coarsened basilar interstitial changes and bronchitic features are similar to comparison studies likely accentuated by some basilar atelectasis. Apical lucency compatible with emphysematous changes seen on prior cross-sectional imaging. No consolidation, features of edema, pneumothorax, or effusion. The aorta is calcified. The remaining cardiomediastinal contours are unremarkable. No acute osseous or soft tissue abnormality. Degenerative changes are present in the imaged spine and shoulders. Mild levocurvature of the spine. IMPRESSION: 1. Mild atelectasis, otherwise no acute cardiopulmonary abnormality. 2. Chronically coarsened basilar interstitial changes and bronchitic features are similar to comparison studies. 3. 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 Electronically Signed   By: Lovena Le M.D.   On: 01/31/2020 15:45   DG Chest Port 1 View  Result Date: 01/31/2020 CLINICAL DATA:  New atrial flutter. Fever and syncope. EXAM: PORTABLE CHEST 1 VIEW COMPARISON:  Chest x-rays dated 01/31/2020 and 10/11/2010. FINDINGS: Heart size and mediastinal contours are within normal limits. Lungs are clear. No pleural effusion or pneumothorax is seen. Osseous structures about the chest are unremarkable. IMPRESSION: No active disease. No evidence of pneumonia or pulmonary edema. Electronically Signed   By: Franki Cabot M.D.   On: 01/31/2020 04:26   DG Chest Port 1 View  Result Date: 01/31/2020 CLINICAL DATA:  Sepsis EXAM: PORTABLE CHEST 1 VIEW COMPARISON:  None. FINDINGS: The heart size and mediastinal contours are within normal limits. Both lungs are clear. The visualized skeletal structures are unremarkable. IMPRESSION: No active disease. Electronically Signed   By: Prudencio Pair M.D.   On: 01/31/2020 00:39   ECHOCARDIOGRAM COMPLETE  Result Date: 01/31/2020    ECHOCARDIOGRAM REPORT   Patient Name:   JAMEKA GORNTO Date of Exam: 01/31/2020 Medical Rec #:  [id number]           Height:       64.0 in Accession #:    [id number]         Weight:       162.0 lb Date of Birth:  Feb 28, 1941          BSA:          1.789 m Patient  Age:    33 years           BP:           106/51 mmHg Patient Gender: F                  HR:           91 bpm. Exam Location:  Inpatient Procedure: 2D Echo, Cardiac Doppler and Color Doppler Indications:    I48.91* Unspeicified atrial fibrillation  History:        Patient has prior history of Echocardiogram examinations, most                 recent 09/01/2008.  Sonographer:    Merrie Roof RDCS Referring Phys: [place]  1. Left ventricular ejection fraction, by estimation, is 60 to 65%. The left ventricle has normal function. The left ventricle has no regional wall motion abnormalities. Left ventricular diastolic parameters were normal.  2. Right ventricular systolic function is normal. The right ventricular size is normal.  3. Left atrial size was mildly dilated.  4. The mitral valve is degenerative. Trivial mitral valve regurgitation. No evidence of mitral stenosis.  5. The aortic valve is tricuspid. Aortic valve regurgitation is not visualized. Mild to moderate aortic valve sclerosis/calcification is present, without any evidence of aortic stenosis.  6. The inferior vena cava is normal in size with greater than 50% respiratory variability, suggesting right atrial pressure of 3 mmHg. FINDINGS  Left Ventricle: Left ventricular ejection fraction, by estimation, is 60 to 65%. The left ventricle has normal function. The left ventricle has no regional wall motion abnormalities. The left ventricular internal cavity size was normal in size. There is  no left ventricular hypertrophy. Left ventricular diastolic parameters were normal. Right Ventricle: The right ventricular size is normal. No increase in right ventricular wall thickness. Right ventricular systolic function is normal. Left Atrium: Left atrial size was mildly dilated. Right Atrium: Right  atrial size was normal in size. Pericardium: There is no evidence of pericardial effusion. Mitral Valve: The mitral valve is degenerative in appearance. There is mild thickening of the mitral valve leaflet(s). There is mild calcification of the mitral valve leaflet(s). Normal mobility of the mitral valve leaflets. Moderate mitral annular calcification. Trivial mitral valve regurgitation. No evidence of mitral valve stenosis. Tricuspid Valve: The tricuspid valve is normal in structure. Tricuspid valve regurgitation is trivial. No evidence of tricuspid stenosis. Aortic Valve: The aortic valve is tricuspid. Aortic valve regurgitation is not visualized. Mild to moderate aortic valve sclerosis/calcification is present, without any evidence of aortic stenosis. Pulmonic Valve: The pulmonic valve was normal in structure. Pulmonic valve regurgitation is not visualized. No evidence of pulmonic stenosis. Aorta: The aortic root is normal in size and structure. Venous: The inferior vena cava is normal in size with greater than 50% respiratory variability, suggesting right atrial pressure of 3 mmHg. IAS/Shunts: No atrial level shunt detected by color flow Doppler.  LEFT VENTRICLE PLAX 2D LVIDd:         3.80 cm     Diastology LVIDs:         2.50 cm     LV e' lateral:   8.81 cm/s LV PW:         0.90 cm     LV E/e' lateral: 10.9 LV IVS:        1.00 cm     LV e' medial:    7.07 cm/s  LV E/e' medial:  13.6  LV Volumes (MOD) LV vol d, MOD A4C: 46.5 ml LV vol s, MOD A4C: 17.7 ml LV SV MOD A4C:     46.5 ml RIGHT VENTRICLE             IVC RV Basal diam:  2.70 cm     IVC diam: 2.10 cm RV S prime:     10.10 cm/s TAPSE (M-mode): 1.9 cm LEFT ATRIUM           Index       RIGHT ATRIUM           Index LA diam:      4.10 cm 2.29 cm/m  RA Area:     10.80 cm LA Vol (A2C): 63.2 ml 35.33 ml/m RA Volume:   22.70 ml  12.69 ml/m LA Vol (A4C): 60.7 ml 33.93 ml/m  AORTIC VALVE LVOT Vmax:   90.20 cm/s LVOT Vmean:  60.700 cm/s  LVOT VTI:    0.187 m  AORTA Ao Root diam: 3.00 cm Ao Asc diam:  2.90 cm MITRAL VALVE MV Area (PHT): 5.27 cm     SHUNTS MV Decel Time: 144 msec     Systemic VTI: 0.19 m MV E velocity: 96.00 cm/s MV A velocity: 125.00 cm/s MV E/A ratio:  0.77 Jenkins Rouge MD Electronically signed by Jenkins Rouge MD Signature Date/Time: 01/31/2020/2:20:10 PM    Final     Time Spent in minutes  68   Rubby Barbary M.D on 02/04/2020 at 5:20 PM  To page go to [email or web address] - password [facility]

## 2020-02-04 NOTE — Plan of Care (Signed)
  Problem: Coping: Goal: Level of anxiety will decrease Outcome: Progressing   Problem: Safety: Goal: Ability to remain free from injury will improve Outcome: Progressing   Problem: Skin Integrity: Goal: Risk for impaired skin integrity will decrease Outcome: Progressing   

## 2020-02-04 NOTE — TOC Initial Note (Addendum)
Transition of Care [facility]) - Initial/Assessment Note    Patient Details  Name: [name] MRN: [id number] Date of Birth: 1941/06/26  Transition of Care [facility]) CM/SW Contact:    Bartholomew Crews, RN Phone Number: [phone] 02/04/2020, 1:19 PM  Clinical Narrative:                  14:58 Spoke with spouse on the phone to provide bed offers. Spouse states his first choice is [facility]. Message left for [facility] to review patient. Will follow up with spouse in the AM for choice.  13:19 Spoke with patient and spouse at the bedside. PTA home with spouse. Discussed recommendations for SNF. Patient and spouse agreeable. Reviewed SNF process, and patient and spouse agreed to information being faxed out. PASRR number created. FL2 faxed out. Will follow up with patient and spouse with bed offers. Once facility selected patient will need insurance authorization and a covid test. TOC following for transition needs.   Expected Discharge Plan: Skilled Nursing Facility Barriers to Discharge: Continued Medical Work up   Patient Goals and CMS Choice Patient states their goals for this hospitalization and ongoing recovery are:: rehab before home CMS Medicare.gov Compare Post Acute Care list provided to:: Patient Choice offered to / list presented to : Patient, Spouse  Expected Discharge Plan and Services Expected Discharge Plan: [place] In-house Referral: Clinical Social Work Discharge Planning Services: CM Consult Post Acute Care Choice: [place] arrangements for the past 2 months: Single Family Home                 DME Arranged: N/A DME Agency: NA       HH Arranged: NA [place] Agency: NA        Prior Living Arrangements/Services Living arrangements for the past 2 months: [place] Lives with:: Self, Spouse Patient language and need for interpreter reviewed:: Yes Do you feel safe going back to the place where you live?: Yes      Need for  Family Participation in Patient Care: Yes (Comment) Care giver support system in place?: Yes (comment)   Criminal Activity/Legal Involvement Pertinent to Current Situation/Hospitalization: No - Comment as needed  Activities of Daily Living      Permission Sought/Granted Permission sought to share information with : Family Supports Permission granted to share information with : Yes, Verbal Permission Granted  Share Information with NAME: Synia Guarente     Permission granted to share info w Relationship: spouse  Permission granted to share info w Contact Information: [phone]  Emotional Assessment Appearance:: Appears stated age Attitude/Demeanor/Rapport: Engaged Affect (typically observed): Accepting Orientation: : Oriented to Self, Oriented to  Time, Oriented to Place, Oriented to Situation Alcohol / Substance Use: Not Applicable Psych Involvement: No (comment)  Admission diagnosis:  Septic shock ([place]) [A41.9, R65.21] Severe sepsis with acute organ dysfunction due to Gram negative bacteria ([place]) [A41.50, R65.20] Severe sepsis with acute organ dysfunction due to gram-negative bacteria (H[facility]) [A41.50, R65.20] Sepsis, due to unspecified organism, unspecified whether acute organ dysfunction present [facility]) [A41.9] Patient Active Problem List   Diagnosis Date Noted  . Sepsis due to gram-negative bacteria ([place]) 01/31/2020  . Lactic acidosis 01/31/2020  . Complicated UTI (urinary tract infection) 01/31/2020  . 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 Acute renal failure superimposed on stage 3 chronic kidney disease ([place]) 01/31/2020  . Severe sepsis with acute organ dysfunction due to gram-negative bacteria ([place]) 01/31/2020  . Hypokalemia due to inadequate potassium intake 01/31/2020  .  Severe sepsis with acute organ dysfunction due to Gram negative bacteria ([place]) 01/31/2020  . Septic shock ([place]) 01/31/2020  . Episodic lightheadedness 08/27/2019  . Fatigue 02/24/2019  . Dizziness  02/24/2019  . Peripheral neuropathy 08/23/2018  . Poor balance 02/20/2018  . Genetic testing 09/04/2017  . Family history of breast cancer   . Family history of ovarian cancer   . Fatty liver 08/05/2015  . History of breast cancer 08/04/2015  . S/P bilateral mastectomy 08/04/2015  . Osteoarthritis of left knee 08/04/2015  . Elevated LFTs 08/10/2014  . Osteopenia 08/31/2013  . Transfusion history 08/18/2013  . IBS (irritable bowel syndrome) 08/18/2013  . 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 Joint pain 09/02/2010  . History of colonic polyps 06/06/2010  . Essential hypertension 02/16/2009  . Type 2 diabetes mellitus with stage 3 chronic kidney disease, without long-term current use of insulin ([place]) 08/11/2008  . Esophageal reflux 05/06/2008  . Hypertriglyceridemia 09/30/2007  . [place] SYNDROME 02/06/2007   PCP:  Binnie Rail, MD Pharmacy:   [place], [address] [place] [address] Phone: [phone] Fax: [phone]     Social Determinants of Health (SDOH) Interventions    Readmission Risk Interventions No flowsheet data found.

## 2020-02-04 NOTE — NC FL2 (Addendum)
[place] LEVEL OF CARE SCREENING TOOL     IDENTIFICATION  Patient Name: [name] Birthdate: Jul 21, 1941 Sex: female Admission Date (Current Location): 01/30/2020  [facility] and [place] Number:  Herbalist and Address:  [facility] [place], [place] [address], [place], [place] [address]      Provider Number: [id number]  Attending Physician Name and Address:  Yaakov Guthrie, MD  Relative Name and Phone Number:  Ismahan Kumpf (spouse) [phone]    Current Level of Care: Hospital Recommended Level of Care: [place] Prior Approval Number:    Date Approved/Denied: 02/04/20 PASRR Number: [id number]  Discharge Plan: Home    Current Diagnoses: Patient Active Problem List   Diagnosis Date Noted  . Sepsis due to gram-negative bacteria ([place]) 01/31/2020  . Lactic acidosis 01/31/2020  . Complicated UTI (urinary tract infection) 01/31/2020  . 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 Acute renal failure superimposed on stage 3 chronic kidney disease ([place]) 01/31/2020  . Severe sepsis with acute organ dysfunction due to gram-negative bacteria ([place]) 01/31/2020  . Hypokalemia due to inadequate potassium intake 01/31/2020  . Severe sepsis with acute organ dysfunction due to Gram negative bacteria ([place]) 01/31/2020  . Septic shock ([place]) 01/31/2020  . Episodic lightheadedness 08/27/2019  . Fatigue 02/24/2019  . Dizziness 02/24/2019  . Peripheral neuropathy 08/23/2018  . Poor balance 02/20/2018  . Genetic testing 09/04/2017  . Family history of breast cancer   . Family history of ovarian cancer   . Fatty liver 08/05/2015  . History of breast cancer 08/04/2015  . S/P bilateral mastectomy 08/04/2015  . Osteoarthritis of left knee 08/04/2015  . Elevated LFTs 08/10/2014  . Osteopenia 08/31/2013  . Transfusion history 08/18/2013  . IBS (irritable bowel syndrome) 08/18/2013  . Cancer of central portion of right female breast  ([place]) 09/04/2010  . Joint pain 09/02/2010  . History of colonic polyps 06/06/2010  . Essential hypertension 02/16/2009  . Type 2 diabetes mellitus with stage 3 chronic kidney disease, without long-term current use of insulin ([place]) 08/11/2008  . Esophageal reflux 05/06/2008  . Hypertriglyceridemia 09/30/2007  . GILBERT'S SYNDROME 02/06/2007    Orientation RESPIRATION BLADDER Height & Weight     Self, Time, Situation, Place  Normal Continent Weight: 74 kg Height:  5\' 4"  (162.6 cm)  BEHAVIORAL SYMPTOMS/MOOD NEUROLOGICAL BOWEL NUTRITION STATUS      Continent Diet  AMBULATORY STATUS COMMUNICATION OF NEEDS Skin   Limited Assist Verbally Bruising(bilateral arms)                       Personal Care Assistance Level of Assistance  Dressing, Feeding, Bathing   Feeding assistance: Limited assistance Dressing Assistance: Limited assistance     Functional Limitations Info  Sight, Speech, Hearing Sight Info: Adequate Hearing Info: Adequate Speech Info: Adequate    SPECIAL CARE FACTORS FREQUENCY  PT (By licensed PT), OT (By licensed OT)     PT Frequency: PT at SNF to eval and treat a min of 5x week OT Frequency: OT at SNF to eval and treat a min of 5x week            Contractures Contractures Info: Not present    Additional Factors Info  Code Status, Allergies, Insulin Sliding Scale Code Status Info: Full Allergies Info: sulfur, exemestane, morphine, statins, ace inhibitors, codeine   Insulin Sliding Scale Info: moderate scale       Current Medications (02/04/2020):  This is the current hospital  active medication list Current Facility-Administered Medications  Medication Dose Route Frequency Provider Last Rate Last Admin  . albuterol (PROVENTIL) (2.5 MG/3ML) 0.083% nebulizer solution 2.5 mg  2.5 mg Nebulization Q4H PRN Kipp Brood, MD   2.5 mg at 02/02/20 0556  . aspirin chewable tablet 81 mg  81 mg Oral Daily Thurnell Lose, MD   81 mg at 02/04/20 1101  .  carbidopa-levodopa (SINEMET IR) 25-100 MG per tablet immediate release 1 tablet  1 tablet Oral TID Kipp Brood, MD   1 tablet at 02/04/20 1101  . carvedilol (COREG) tablet 6.25 mg  6.25 mg Oral BID WC Thurnell Lose, MD   6.25 mg at 02/04/20 0825  . cefTRIAXone (ROCEPHIN) 2 g in sodium chloride 0.9 % 100 mL IVPB  2 g Intravenous Q24H Agarwala, Einar Grad, MD 200 mL/hr at 02/03/20 1716 2 g at 02/03/20 1716  . Chlorhexidine Gluconate Cloth 2 % PADS 6 each  6 each Topical Daily Kipp Brood, MD   6 each at 02/04/20 1101  . enoxaparin (LOVENOX) injection 40 mg  40 mg Subcutaneous Q24H Pierce, Dwayne A, RPH   40 mg at 02/03/20 1714  . insulin aspart (novoLOG) injection 0-15 Units  0-15 Units Subcutaneous TID AC & HS Kipp Brood, MD   2 Units at 02/04/20 0825  . MEDLINE mouth rinse  15 mL Mouth Rinse BID Agarwala, Ravi, MD   15 mL at 02/04/20 1102  . ondansetron (ZOFRAN) injection 4 mg  4 mg Intravenous Q6H PRN Kipp Brood, MD         Discharge Medications: Please see discharge summary for a list of discharge medications.  Relevant Imaging Results:  Relevant Lab Results:   Additional Information SSN [id number]  Bartholomew Crews, RN

## 2020-02-05 LAB — CBC WITH DIFFERENTIAL/PLATELET
Abs Immature Granulocytes: 0.43 10*3/uL — ABNORMAL HIGH (ref 0.00–0.07)
Basophils Absolute: 0 10*3/uL (ref 0.0–0.1)
Basophils Relative: 0 %
Eosinophils Absolute: 0.1 10*3/uL (ref 0.0–0.5)
Eosinophils Relative: 2 %
HCT: 31 % — ABNORMAL LOW (ref 36.0–46.0)
Hemoglobin: 10.2 g/dL — ABNORMAL LOW (ref 12.0–15.0)
Immature Granulocytes: 5 %
Lymphocytes Relative: 15 %
Lymphs Abs: 1.4 10*3/uL (ref 0.7–4.0)
MCH: 29.1 pg (ref 26.0–34.0)
MCHC: 32.9 g/dL (ref 30.0–36.0)
MCV: 88.6 fL (ref 80.0–100.0)
Monocytes Absolute: 0.8 10*3/uL (ref 0.1–1.0)
Monocytes Relative: 9 %
Neutro Abs: 6.6 10*3/uL (ref 1.7–7.7)
Neutrophils Relative %: 69 %
Platelets: 213 10*3/uL (ref 150–400)
RBC: 3.5 MIL/uL — ABNORMAL LOW (ref 3.87–5.11)
RDW: 13.3 % (ref 11.5–15.5)
WBC: 9.4 10*3/uL (ref 4.0–10.5)
nRBC: 0.2 % (ref 0.0–0.2)

## 2020-02-05 LAB — COMPREHENSIVE METABOLIC PANEL
ALT: 9 U/L (ref 0–44)
AST: 55 U/L — ABNORMAL HIGH (ref 15–41)
Albumin: 2.1 g/dL — ABNORMAL LOW (ref 3.5–5.0)
Alkaline Phosphatase: 80 U/L (ref 38–126)
Anion gap: 9 (ref 5–15)
BUN: 16 mg/dL (ref 8–23)
CO2: 26 mmol/L (ref 22–32)
Calcium: 8.4 mg/dL — ABNORMAL LOW (ref 8.9–10.3)
Chloride: 108 mmol/L (ref 98–111)
Creatinine, Ser: 1.17 mg/dL — ABNORMAL HIGH (ref 0.44–1.00)
GFR calc Af Amer: 52 mL/min — ABNORMAL LOW (ref >60)
GFR calc non Af Amer: 45 mL/min — ABNORMAL LOW (ref >60)
Glucose, Bld: 123 mg/dL — ABNORMAL HIGH (ref 70–99)
Potassium: 3.9 mmol/L (ref 3.5–5.1)
Sodium: 143 mmol/L (ref 135–145)
Total Bilirubin: 0.6 mg/dL (ref 0.3–1.2)
Total Protein: 5.4 g/dL — ABNORMAL LOW (ref 6.5–8.1)

## 2020-02-05 LAB — MAGNESIUM: Magnesium: 1.6 mg/dL — ABNORMAL LOW (ref 1.7–2.4)

## 2020-02-05 LAB — GLUCOSE, CAPILLARY
Glucose-Capillary: 123 mg/dL — ABNORMAL HIGH (ref 70–99)
Glucose-Capillary: 113 mg/dL — ABNORMAL HIGH (ref 70–99)
Glucose-Capillary: 81 mg/dL (ref 70–99)
Glucose-Capillary: 99 mg/dL (ref 70–99)

## 2020-02-05 LAB — BRAIN NATRIURETIC PEPTIDE: B Natriuretic Peptide: 655.4 pg/mL — ABNORMAL HIGH (ref 0.0–100.0)

## 2020-02-05 LAB — SARS CORONAVIRUS 2 (TAT 6-24 HRS): SARS Coronavirus 2: NEGATIVE

## 2020-02-05 MED ORDER — CARVEDILOL 12.5 MG PO TABS
12.5000 mg | ORAL_TABLET | Freq: Two times a day (BID) | ORAL | Status: DC
  Administered 2020-02-06: 12.5 mg via ORAL
  Filled 2020-02-05: qty 1

## 2020-02-05 MED ORDER — AMLODIPINE BESYLATE 5 MG PO TABS
5.0000 mg | ORAL_TABLET | Freq: Every day | ORAL | Status: DC
  Administered 2020-02-05 – 2020-02-06 (×2): 5 mg via ORAL
  Filled 2020-02-05 (×2): qty 1

## 2020-02-05 NOTE — Progress Notes (Signed)
Physical Therapy Treatment Patient Details Name: [name] MRN: [id number] DOB: 1940/12/03 Today's Date: 02/05/2020    History of Present Illness 79 year old female with several day history of progressively worsening weakness and fever.  Found to have UTI, hypotension and transient A fib in ER  PMHx:of DMT2, HTN, CKDx stage III, diabetic PN and remote history of breast cancer     PT Comments    Pt ambulating short hallway distance this session, requiring frequent safety cuing for directing RW around hallway objects and form during gait. Pt requires rest breaks during mobility due to decreased activity tolerance. Pt tolerated limited LE exercises this session, with emphasis placed on repeated sit to stands for improving pt's transfer ability. PT continuing to recommend SNF to maximize pt mobility and function prior to d/c home. Will continue to follow acutely.    Follow Up Recommendations  SNF     Equipment Recommendations  None recommended by PT    Recommendations for Other Services       Precautions / Restrictions Precautions Precautions: Fall Precaution Comments: frequent falls Restrictions Weight Bearing Restrictions: No    Mobility  Bed Mobility Overal bed mobility: Needs Assistance             General bed mobility comments: up in recliner pre and post session  Transfers Overall transfer level: Needs assistance Equipment used: Rolling walker (2 wheeled) Transfers: Sit to/from Stand Sit to Stand: Min assist         General transfer comment: assist to steady and power up from low surface, min verbal cuing for hand placement when rising and sitting. sit to stand from recliner and standard toilet, with repeated sit to stands as exercise (see LE exercise).  Ambulation/Gait Ambulation/Gait assistance: Min guard Gait Distance (Feet): 50 Feet(2x50) Assistive device: Rolling walker (2 wheeled) Gait Pattern/deviations: Decreased stride length;Trunk  flexed;Step-through pattern Gait velocity: decr   General Gait Details: Min guard for safety, verbal cuing for increasing step length as pt tolerates, taking standing rest break to recover fatigue and momentary dizziness.   Stairs             Wheelchair Mobility    Modified Rankin (Stroke Patients Only)       Balance Overall balance assessment: Needs assistance Sitting-balance support: No upper extremity supported;Feet supported Sitting balance-Leahy Scale: Fair     Standing balance support: During functional activity;No upper extremity supported Standing balance-Leahy Scale: Fair Standing balance comment: able to stand at sink without UE support for ~10 seconds                            Cognition Arousal/Alertness: Awake/alert Behavior During Therapy: WFL for tasks assessed/performed Overall Cognitive Status: Impaired/Different from baseline Area of Impairment: Safety/judgement;Attention                   Current Attention Level: Sustained     Safety/Judgement: Decreased awareness of safety;Decreased awareness of deficits     General Comments: A/Ox4, requires safety cues especially with use of RW. Pt with tendency to bump into hallway objects requiring PT assist to correct      Exercises General Exercises - Lower Extremity Long Arc Quad: AROM;Both;10 reps;Seated(against min PT resistance) Mini-Sqauts: AROM;Both;5 reps;Seated;Standing(verbal cuing for slow and controlled)    General Comments        Pertinent Vitals/Pain Pain Assessment: Faces Faces Pain Scale: Hurts a little bit Pain Location: R knee, during exercise Pain Descriptors / Indicators:  Discomfort;Grimacing Pain Intervention(s): Limited activity within patient's tolerance;Monitored during session;Repositioned    Home Living                      Prior Function            PT Goals (current goals can now be found in the care plan section) Acute Rehab PT  Goals Patient Stated Goal: get stronger PT Goal Formulation: With patient Time For Goal Achievement: 02/16/20 Potential to Achieve Goals: Good Progress towards PT goals: Progressing toward goals    Frequency    Min 2X/week      PT Plan Current plan remains appropriate    Co-evaluation              AM-PAC PT "6 Clicks" Mobility   Outcome Measure  Help needed turning from your back to your side while in a flat bed without using bedrails?: A Little Help needed moving from lying on your back to sitting on the side of a flat bed without using bedrails?: A Little Help needed moving to and from a bed to a chair (including a wheelchair)?: A Little Help needed standing up from a chair using your arms (e.g., wheelchair or bedside chair)?: A Little Help needed to walk in hospital room?: A Little Help needed climbing 3-5 steps with a railing? : Total 6 Click Score: 16    End of Session Equipment Utilized During Treatment: Gait belt Activity Tolerance: Patient tolerated treatment well Patient left: in chair;with call bell/phone within reach;with chair alarm set Nurse Communication: Mobility status PT Visit Diagnosis: Unsteadiness on feet (R26.81);Other abnormalities of gait and mobility (R26.89);Muscle weakness (generalized) (M62.81)     Time: [id number] PT Time Calculation (min) (ACUTE ONLY): 20 min  Charges:  $Gait Training: 8-22 mins                     Baxter Gonzalez E, PT [place] Pager [phone]  Office [phone]   Abena Erdman D Elonda Husky 02/05/2020, 2:42 PM

## 2020-02-05 NOTE — Progress Notes (Addendum)
PROGRESS NOTE                                                                                                                                                                                                             Patient Demographics:    [name], is a 79 y.o. female, DOB - 01-21-1941, [id number]  Admit date - 01/30/2020   Admitting Physician Chesley Mires, MD  Outpatient Primary MD for the patient is Binnie Rail, MD  LOS - 5  Chief Complaint  Patient presents with  . Fever       Brief Narrative - 79 year old Caucasian female history of DM type II, hypertension, CKD 3, diabetic peripheral neuropathy, past history of breast cancer, IBS, GERD and Gilbert's syndrome who was admitted to the hospital for sepsis arising from UTI, was under ICU care, briefly required pressors, stabilized and transferred to hospitalist service on 02/02/2020.   Subjective:    Bula Jinks today denies headache, No chest pain, No abdominal pain - No Nausea, No new weakness tingling or numbness, No Cough - SOB.    Assessment  & Plan :     1. Sepsis due to E.Coli UTI and bacteremia- was in ICU and required pressors briefly, with supportive care has sepsis pathophysiology seems to have resolved, E. coli seems to be pansensitive and for now continue IV Rocephin. Repeat blood cultures no growth.  2.  Brief run of atrial fibrillation in ICU while she was on pressor.  Per ICU MD Dr. Lynetta Mare resolved after a brief run, her [place] vas 2 score certainly is greater than 3 however this seems to be in the setting of a reversible cause, echo is stable with preserved EF and no wall motion abnormality, she has no history of stroke, TSH normal.  For now aspirin 81 mg along with beta-blocker.  Upon discharge consider 30-day event monitor and follow-up with cardiology, if there is further recurrence of atrial fibrillation then certainly she requires full anticoagulation and further work-up.  3.   Generalized weakness.  Due to #1 above.  PT OT eval, patient agreeable for SNF bed awaited.  4.  Possible cirrhosis on her CT scan.  LFTs remain stable, total bili is high but she carries a diagnosis of Gilbert's syndrome, INR was borderline at 1.4, will defer further work-up to PCP outpatient she may benefit from GI evaluation post discharge.  Question if she has underlying NASH.  5.  History of Parkinson's.  Continue combination of carbidopa levodopa.  6.  Essential hypertension.  Placed on Coreg. Continue Norvasc.  Blood pressure continues to remain high.  Will increase the dosage.  Continue to monitor and adjust medications as needed.  7.  AKI on CKD 2.  Stable.  Baseline creatinine around 1.2.  Trend is improving and almost at baseline now.  8.  DM type II.  On Glucophage at home, sliding scale continued.  Continue to monitor Accu-Cheks and adjust medications as needed.  Lab Results  Component Value Date   HGBA1C 6.5 (H) 01/31/2020   CBG (last 3)  Recent Labs    02/05/20 0636 02/05/20 1134 02/05/20 1637  GLUCAP 123* 113* 81      Condition - Fair  Family Communication  : Husband Samuel at bedside.  Code Status :  Full  Consults  :  PCCM  Procedures  :  None  CT -  1. No acute findings within the abdomen or pelvis. No bowel obstruction or evidence of bowel wall inflammation. No renal or ureteral calculi. No evidence of pyelonephritis. 2. Cirrhotic appearing liver. Aortic Atherosclerosis  TTE  1. Left ventricular ejection fraction, by estimation, is 60 to 65%. The left ventricle has normal function. The left ventricle has no regional wall motion abnormalities. Left ventricular diastolic parameters were normal.  2. Right ventricular systolic function is normal. The right ventricular size is normal.  3. Left atrial size was mildly dilated.  4. The mitral valve is degenerative. Trivial mitral valve regurgitation. No evidence of mitral stenosis.  5. The aortic valve is  tricuspid. Aortic valve regurgitation is not visualized. Mild to moderate aortic valve sclerosis/calcification is present, without any evidence of aortic stenosis.  6. The inferior vena cava is normal in size with greater than 50% respiratory variability, suggesting right atrial pressure of 3 mmHg.   PUD Prophylaxis : Lovenox  Disposition Plan  :  SNF  Status is: Inpatient  Dispo: The patient is from: Home              Anticipated d/c is to: SNF              Anticipated d/c date is: When bed available                 DVT Prophylaxis  :  Lovenox   Lab Results  Component Value Date   PLT 213 02/05/2020    Diet :   Diet Order            Diet heart healthy/carb modified Room service appropriate? Yes; Fluid consistency: Thin  Diet effective now               Inpatient Medications Scheduled Meds: . aspirin  81 mg Oral Daily  . carbidopa-levodopa  1 tablet Oral TID  . carvedilol  6.25 mg Oral BID WC  . Chlorhexidine Gluconate Cloth  6 each Topical Daily  . enoxaparin (LOVENOX) injection  40 mg Subcutaneous Q24H  . insulin aspart  0-15 Units Subcutaneous TID AC & HS  . mouth rinse  15 mL Mouth Rinse BID   Continuous Infusions: . cefTRIAXone (ROCEPHIN)  IV 2 g (02/05/20 1712)   PRN Meds:.albuterol, [DISCONTINUED] ondansetron **OR** ondansetron (ZOFRAN) IV  Antibiotics  :   Anti-infectives (From admission, onward)   Start     Dose/Rate Route Frequency Ordered Stop   02/01/20 1600  cefTRIAXone (ROCEPHIN) 2 g in sodium chloride 0.9 % 100 mL IVPB     2 g 200 mL/hr over 30 Minutes Intravenous Every  24 hours 02/01/20 0841     02/01/20 1000  cefTRIAXone (ROCEPHIN) 1 g in sodium chloride 0.9 % 100 mL IVPB  Status:  Discontinued     1 g 200 mL/hr over 30 Minutes Intravenous Every 24 hours 02/01/20 0835 02/01/20 0841   01/31/20 0500  piperacillin-tazobactam (ZOSYN) IVPB 3.375 g  Status:  Discontinued     3.375 g 12.5 mL/hr over 240 Minutes Intravenous Every 8 hours 01/31/20  0459 02/01/20 0833   01/31/20 0015  cefTRIAXone (ROCEPHIN) 1 g in sodium chloride 0.9 % 100 mL IVPB     1 g 200 mL/hr over 30 Minutes Intravenous  Once 01/31/20 0007 01/31/20 0105        Objective:   Vitals:   02/04/20 2045 02/05/20 0448 02/05/20 0909 02/05/20 1640  BP: (!) 152/72 (!) 158/68 (!) 154/62 (!) 161/75  Pulse: 84 88 83 81  Resp: 18 16 20 18   Temp: 98.4 F (36.9 C) 98.3 F (36.8 C) 98.3 F (36.8 C) 99 F (37.2 C)  TempSrc: Oral Oral Oral Oral  SpO2: 98% 96% 98% 99%  Weight:      Height:        SpO2: 99 % O2 Flow Rate (L/min): 2 L/min  Wt Readings from Last 3 Encounters:  02/03/20 74 kg  08/27/19 74.3 kg  02/24/19 75.8 kg     Intake/Output Summary (Last 24 hours) at 02/05/2020 1712 Last data filed at 02/05/2020 0553 Gross per 24 hour  Intake 0 ml  Output 200 ml  Net -200 ml     Physical Exam  Awake Alert, No new F.N deficits, Normal affect [place].AT,PERRAL Supple Neck,No JVD, No cervical lymphadenopathy appriciated.  Symmetrical Chest wall movement, Good air movement bilaterally, CTAB RRR,No Gallops, Rubs or new Murmurs, No Parasternal Heave Positive bowel sounds, Abd Soft, No tenderness, No organomegaly appriciated, No rebound - guarding or rigidity. No Cyanosis, Clubbing or edema, No new Rash or bruise     Data Review:    Recent Labs  Lab 01/31/20 0506 02/02/20 0500 02/03/20 0444 02/04/20 0457 02/05/20 0432  WBC 9.1 9.5 8.5 9.3 9.4  HGB 11.4* 10.8* 10.3* 11.4* 10.2*  HCT 34.3* 32.3* 31.0* 34.4* 31.0*  PLT 151 139* 159 193 213  MCV 88.6 88.3 88.3 89.1 88.6  MCH 29.5 29.5 29.3 29.5 29.1  MCHC 33.2 33.4 33.2 33.1 32.9  RDW 13.1 13.2 13.4 13.3 13.3  LYMPHSABS 0.4* 0.9 1.0 1.0 1.4  MONOABS 0.8 1.1* 0.9 0.8 0.8  EOSABS 0.0 0.0 0.1 0.1 0.1  BASOSABS 0.0 0.0 0.0 0.0 0.0    Recent Labs  Lab 01/31/20 0007 01/31/20 0007 01/31/20 0506 01/31/20 0506 01/31/20 0557 02/01/20 0227 02/02/20 0500 02/02/20 1119 02/03/20 0444 02/04/20 0457  02/05/20 0432  NA 136   < > 142   < >  --  142 143  --  144 145 143  K 2.7*   < > 3.5   < >  --  3.7 3.3*  --  3.5 3.9 3.9  CL 101   < > 109   < >  --  113* 113*  --  113* 110 108  CO2 21*   < > 19*   < >  --  20* 20*  --  24 24 26   GLUCOSE 221*   < > 153*   < >  --  124* 142*  --  136* 134* 123*  BUN 23   < > 22   < >  --  23 21  --  18 16 16   CREATININE 1.81*   < > 1.60*   < >  --  1.54* 1.33*  --  1.17* 1.10* 1.17*  CALCIUM 8.4*   < > 7.7*   < >  --  7.3* 8.0*  --  8.1* 8.5* 8.4*  AST 32  --  32  --   --   --   --   --  30 52* 55*  ALT 28  --  26  --   --   --   --   --  7 8 9   ALKPHOS 45  --  65  --   --   --   --   --  55 75 80  BILITOT 2.4*  --  1.9*  --   --   --   --   --  0.7 0.9 0.6  ALBUMIN 2.8*  --  2.3*  --   --   --   --   --  2.0* 2.2* 2.1*  MG  --   --   --   --   --  1.7 2.0  --  1.7 1.8 1.6*  PROCALCITON  --   --  12.36  --   --   --   --   --   --   --   --   INR 1.3*  --  1.4*  --   --   --   --   --   --   --   --   TSH  --   --   --   --   --   --   --  2.102  --   --   --   HGBA1C  --   --   --   --  6.5*  --   --   --   --   --   --   BNP  --   --   --   --   --   --  712.9*  --  532.1* 580.1* 655.4*   < > = values in this interval not displayed.    Recent Labs  Lab 01/31/20 0151 01/31/20 0506 02/02/20 0500 02/03/20 0444 02/04/20 0457 02/05/20 0432  BNP  --   --  712.9* 532.1* 580.1* 655.4*  PROCALCITON  --  12.36  --   --   --   --   SARSCOV2NAA NEGATIVE  --   --   --   --   --     ------------------------------------------------------------------------------------------------------------------ No results for input(s): CHOL, HDL, LDLCALC, TRIG, CHOLHDL, LDLDIRECT in the last 72 hours.  Lab Results  Component Value Date   HGBA1C 6.5 (H) 01/31/2020   ------------------------------------------------------------------------------------------------------------------ No results for input(s): TSH, T4TOTAL, T3FREE, THYROIDAB in the last 72  hours.  Invalid input(s): FREET3 ------------------------------------------------------------------------------------------------------------------ No results for input(s): VITAMINB12, FOLATE, FERRITIN, TIBC, IRON, RETICCTPCT in the last 72 hours.  Coagulation profile Recent Labs  Lab 01/31/20 0007 01/31/20 0506  INR 1.3* 1.4*    No results for input(s): DDIMER in the last 72 hours.  Cardiac Enzymes No results for input(s): CKMB, TROPONINI, MYOGLOBIN in the last 168 hours.  Invalid input(s): CK ------------------------------------------------------------------------------------------------------------------    Component Value Date/Time   BNP 655.4 (H) 02/05/2020 [id number]    Micro Results Recent Results (from the past 240 hour(s))  Blood Culture (routine x 2)     Status: Abnormal   Collection Time: 01/31/20 12:22 AM  Specimen: BLOOD  Result Value Ref Range Status   Specimen Description BLOOD RIGHT ANTECUBITAL  Final   Special Requests   Final    BOTTLES DRAWN AEROBIC AND ANAEROBIC Blood Culture results may not be optimal due to an excessive volume of blood received in culture bottles   Culture  Setup Time   Final    GRAM NEGATIVE RODS IN BOTH AEROBIC AND ANAEROBIC BOTTLES CRITICAL RESULT CALLED TO, READ BACK BY AND VERIFIED WITH: PHATMD L CRRAU LJ:[id number] AT 53 B Y CM Performed at [facility], [place] [address], [place], [place] [address]    Culture ESCHERICHIA COLI (A)  Final   Report Status 02/02/2020 FINAL  Final   Organism ID, Bacteria ESCHERICHIA COLI  Final      Susceptibility   Escherichia coli - MIC*    AMPICILLIN >=32 RESISTANT Resistant     CEFAZOLIN >=64 RESISTANT Resistant     CEFEPIME <=1 SENSITIVE Sensitive     CEFTAZIDIME <=1 SENSITIVE Sensitive     CEFTRIAXONE <=1 SENSITIVE Sensitive     CIPROFLOXACIN <=0.25 SENSITIVE Sensitive     GENTAMICIN <=1 SENSITIVE Sensitive     IMIPENEM <=0.25 SENSITIVE Sensitive     TRIMETH/SULFA <=20 SENSITIVE Sensitive      AMPICILLIN/SULBACTAM >=32 RESISTANT Resistant     PIP/TAZO 64 INTERMEDIATE Intermediate     * ESCHERICHIA COLI  Blood Culture ID Panel (Reflexed)     Status: Abnormal   Collection Time: 01/31/20 12:22 AM  Result Value Ref Range Status   Enterococcus species NOT DETECTED NOT DETECTED Final   Listeria monocytogenes NOT DETECTED NOT DETECTED Final   Staphylococcus species NOT DETECTED NOT DETECTED Final   Staphylococcus aureus (BCID) NOT DETECTED NOT DETECTED Final   Streptococcus species NOT DETECTED NOT DETECTED Final   Streptococcus agalactiae NOT DETECTED NOT DETECTED Final   Streptococcus pneumoniae NOT DETECTED NOT DETECTED Final   Streptococcus pyogenes NOT DETECTED NOT DETECTED Final   Acinetobacter baumannii NOT DETECTED NOT DETECTED Final   Enterobacteriaceae species DETECTED (A) NOT DETECTED Final    Comment: Enterobacteriaceae represent a large family of gram-negative bacteria, not a single organism. CRITICAL RESULT CALLED TO, READ BACK BY AND VERIFIED WITH: PHARMD L CRRAU [id number]:[id number] AT 1420 BY CM    Enterobacter cloacae complex NOT DETECTED NOT DETECTED Final   Escherichia coli DETECTED (A) NOT DETECTED Final    Comment: CRITICAL RESULT CALLED TO, READ BACK BY AND VERIFIED WITH: PHARMD L CRRAU LJ:[id number] AT 1620 BY CM    Klebsiella oxytoca NOT DETECTED NOT DETECTED Final   Klebsiella pneumoniae NOT DETECTED NOT DETECTED Final   Proteus species NOT DETECTED NOT DETECTED Final   Serratia marcescens NOT DETECTED NOT DETECTED Final   Carbapenem resistance NOT DETECTED NOT DETECTED Final   Haemophilus influenzae NOT DETECTED NOT DETECTED Final   Neisseria meningitidis NOT DETECTED NOT DETECTED Final   Pseudomonas aeruginosa NOT DETECTED NOT DETECTED Final   Candida albicans NOT DETECTED NOT DETECTED Final   Candida glabrata NOT DETECTED NOT DETECTED Final   Candida krusei NOT DETECTED NOT DETECTED Final   Candida parapsilosis NOT DETECTED NOT DETECTED Final   Candida tropicalis  NOT DETECTED NOT DETECTED Final    Comment: Performed at [facility], [place] [address], [place], [place] [address]  Blood Culture (routine x 2)     Status: Abnormal   Collection Time: 01/31/20 12:23 AM   Specimen: BLOOD LEFT WRIST  Result Value Ref Range Status   Specimen Description BLOOD LEFT  WRIST  Final   Special Requests   Final    BOTTLES DRAWN AEROBIC ONLY Blood Culture adequate volume   Culture  Setup Time   Final    GRAM NEGATIVE RODS AEROBIC BOTTLE ONLY CRITICAL VALUE NOTED.  VALUE IS CONSISTENT WITH PREVIOUSLY REPORTED AND CALLED VALUE.    Culture (A)  Final    ESCHERICHIA COLI SUSCEPTIBILITIES PERFORMED ON PREVIOUS CULTURE WITHIN THE LAST 5 DAYS. Performed at [facility], [place] [address] [place], [place] [address]    Report Status 02/02/2020 FINAL  Final  Urine culture     Status: Abnormal   Collection Time: 01/31/20  1:51 AM   Specimen: Urine, Clean Catch  Result Value Ref Range Status   Specimen Description URINE, CLEAN CATCH  Final   Special Requests   Final    NONE Performed at [facility], [place] [address] [place], [place] [address]    Culture >=100,000 COLONIES/mL ESCHERICHIA COLI (A)  Final   Report Status 02/02/2020 FINAL  Final   Organism ID, Bacteria ESCHERICHIA COLI (A)  Final      Susceptibility   Escherichia coli - MIC*    AMPICILLIN >=32 RESISTANT Resistant     CEFAZOLIN >=64 RESISTANT Resistant     CEFTRIAXONE <=1 SENSITIVE Sensitive     CIPROFLOXACIN <=0.25 SENSITIVE Sensitive     GENTAMICIN <=1 SENSITIVE Sensitive     IMIPENEM <=0.25 SENSITIVE Sensitive     NITROFURANTOIN <=16 SENSITIVE Sensitive     TRIMETH/SULFA <=20 SENSITIVE Sensitive     AMPICILLIN/SULBACTAM >=32 RESISTANT Resistant     PIP/TAZO 16 SENSITIVE Sensitive     * >=100,000 COLONIES/mL ESCHERICHIA COLI  SARS Coronavirus 2 by RT PCR (hospital order, performed in [facility] hospital lab) Nasopharyngeal Nasopharyngeal Swab     Status: None   Collection Time:  01/31/20  1:51 AM   Specimen: Nasopharyngeal Swab  Result Value Ref Range Status   SARS Coronavirus 2 NEGATIVE NEGATIVE Final    Comment: (NOTE) SARS-CoV-2 target nucleic acids are NOT DETECTED. The SARS-CoV-2 RNA is generally detectable in upper and lower respiratory specimens during the acute phase of infection. The lowest concentration of SARS-CoV-2 viral copies this assay can detect is 250 copies / mL. A negative result does not preclude SARS-CoV-2 infection and should not be used as the sole basis for treatment or other patient management decisions.  A negative result may occur with improper specimen collection / handling, submission of specimen other than nasopharyngeal swab, presence of viral mutation(s) within the areas targeted by this assay, and inadequate number of viral copies (<250 copies / mL). A negative result must be combined with clinical observations, patient history, and epidemiological information. Fact Sheet for Patients:   StrictlyIdeas.no Fact Sheet for Healthcare Providers: BankingDealers.co.za This test is not yet approved or cleared  by the [place] FDA and has been authorized for detection and/or diagnosis of SARS-CoV-2 by FDA under an Emergency Use Authorization (EUA).  This EUA will remain in effect (meaning this test can be used) for the duration of the COVID-19 declaration under Section 564(b)(1) of the Act, 21 U.S.C. section 360bbb-3(b)(1), unless the authorization is terminated or revoked sooner. Performed at [facility], [place] [address] [place],  [address]   MRSA PCR Screening     Status: None   Collection Time: 01/31/20  4:35 PM   Specimen: Nasopharyngeal  Result Value Ref Range Status   MRSA by PCR NEGATIVE NEGATIVE Final    Comment:  The GeneXpert MRSA Assay (FDA approved for NASAL specimens only), is one component of a comprehensive MRSA colonization surveillance  program. It is not intended to diagnose MRSA infection nor to guide or monitor treatment for MRSA infections. Performed at [facility], [place] [address] [place], [place] [address]   Culture, blood (routine x 2)     Status: None (Preliminary result)   Collection Time: 02/04/20  2:32 PM   Specimen: BLOOD  Result Value Ref Range Status   Specimen Description BLOOD LEFT ANTECUBITAL  Final   Special Requests   Final    BOTTLES DRAWN AEROBIC AND ANAEROBIC Blood Culture adequate volume   Culture   Final    NO GROWTH < 24 HOURS Performed at [facility], [place] [address] [place], [place] [address]    Report Status PENDING  Incomplete  Culture, blood (routine x 2)     Status: None (Preliminary result)   Collection Time: 02/04/20  2:37 PM   Specimen: BLOOD RIGHT HAND  Result Value Ref Range Status   Specimen Description BLOOD RIGHT HAND  Final   Special Requests   Final    BOTTLES DRAWN AEROBIC AND ANAEROBIC Blood Culture adequate volume   Culture   Final    NO GROWTH < 24 HOURS Performed at [facility], [place] [address], [place],  [address]    Report Status PENDING  Incomplete    Radiology Reports CT ABDOMEN PELVIS WO CONTRAST  Result Date: 01/31/2020 CLINICAL DATA:  Sepsis, complicated UTI. Evaluate for pyelonephritis or infected stone. EXAM: CT ABDOMEN AND PELVIS WITHOUT CONTRAST TECHNIQUE: Multidetector CT imaging of the abdomen and pelvis was performed following the standard protocol without IV contrast. COMPARISON:  CT abdomen dated 01/04/2010. FINDINGS: Lower chest: No acute abnormality. Hepatobiliary: Peripheral liver contours are slightly nodular suggesting cirrhosis. No focal liver abnormality is seen. Gallbladder is unremarkable. No bile duct dilatation seen. Pancreas: Unremarkable. No pancreatic ductal dilatation or surrounding inflammatory changes. Spleen: Normal in size without focal abnormality. Adrenals/Urinary Tract: Adrenal glands appear normal.  Kidneys are unremarkable without mass, stone or hydronephrosis. No ureteral or bladder calculi are identified. Bladder is unremarkable, partially decompressed. Stomach/Bowel: No dilated large or small bowel loops. No evidence of bowel wall inflammation. Appendix is not seen but there are no inflammatory changes about the cecum to suggest acute appendicitis. Stomach is unremarkable, decompressed. Vascular/Lymphatic: Aortic atherosclerosis. No enlarged lymph nodes seen. Reproductive: Presumed hysterectomy. No adnexal mass or free fluid. Other: No free fluid or abscess collection seen. No free intraperitoneal air. Musculoskeletal: Mild degenerative spondylosis of the slightly scoliotic thoracolumbar spine. Superficial soft tissues are unremarkable. IMPRESSION: 1. No acute findings within the abdomen or pelvis. No bowel obstruction or evidence of bowel wall inflammation. No renal or ureteral calculi. No evidence of pyelonephritis. 2. Cirrhotic appearing liver. Aortic Atherosclerosis (ICD10-I70.0). Electronically Signed   By: Franki Cabot M.D.   On: 01/31/2020 05:45   DG CHEST PORT 1 VIEW  Result Date: 01/31/2020 CLINICAL DATA:  Desaturation EXAM: PORTABLE CHEST 1 VIEW COMPARISON:  Radiograph 01/31/2020 FINDINGS: Chronically coarsened basilar interstitial changes and bronchitic features are similar to comparison studies likely accentuated by some basilar atelectasis. Apical lucency compatible with emphysematous changes seen on prior cross-sectional imaging. No consolidation, features of edema, pneumothorax, or effusion. The aorta is calcified. The remaining cardiomediastinal contours are unremarkable. No acute osseous or soft tissue abnormality. Degenerative changes are present in the imaged spine and shoulders. Mild levocurvature of the spine. IMPRESSION: 1. Mild  atelectasis, otherwise no acute cardiopulmonary abnormality. 2. Chronically coarsened basilar interstitial changes and bronchitic features are similar to  comparison studies. 3. 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 Electronically Signed   By: Lovena Le M.D.   On: 01/31/2020 15:45   DG Chest Port 1 View  Result Date: 01/31/2020 CLINICAL DATA:  New atrial flutter. Fever and syncope. EXAM: PORTABLE CHEST 1 VIEW COMPARISON:  Chest x-rays dated 01/31/2020 and 10/11/2010. FINDINGS: Heart size and mediastinal contours are within normal limits. Lungs are clear. No pleural effusion or pneumothorax is seen. Osseous structures about the chest are unremarkable. IMPRESSION: No active disease. No evidence of pneumonia or pulmonary edema. Electronically Signed   By: Franki Cabot M.D.   On: 01/31/2020 04:26   DG Chest Port 1 View  Result Date: 01/31/2020 CLINICAL DATA:  Sepsis EXAM: PORTABLE CHEST 1 VIEW COMPARISON:  None. FINDINGS: The heart size and mediastinal contours are within normal limits. Both lungs are clear. The visualized skeletal structures are unremarkable. IMPRESSION: No active disease. Electronically Signed   By: Prudencio Pair M.D.   On: 01/31/2020 00:39   ECHOCARDIOGRAM COMPLETE  Result Date: 01/31/2020    ECHOCARDIOGRAM REPORT   Patient Name:   AYDRIAN BOULLION Date of Exam: 01/31/2020 Medical Rec #:  [id number]          Height:       64.0 in Accession #:    [id number]         Weight:       162.0 lb Date of Birth:  Dec 24, 1940          BSA:          1.789 m Patient Age:    61 years           BP:           106/51 mmHg Patient Gender: F                  HR:           91 bpm. Exam Location:  Inpatient Procedure: 2D Echo, Cardiac Doppler and Color Doppler Indications:    I48.91* Unspeicified atrial fibrillation  History:        Patient has prior history of Echocardiogram examinations, most                 recent 09/01/2008.  Sonographer:    Merrie Roof RDCS Referring Phys: [place]  1. Left ventricular ejection fraction, by estimation, is 60 to 65%. The left ventricle has normal function. The left ventricle has no regional wall  motion abnormalities. Left ventricular diastolic parameters were normal.  2. Right ventricular systolic function is normal. The right ventricular size is normal.  3. Left atrial size was mildly dilated.  4. The mitral valve is degenerative. Trivial mitral valve regurgitation. No evidence of mitral stenosis.  5. The aortic valve is tricuspid. Aortic valve regurgitation is not visualized. Mild to moderate aortic valve sclerosis/calcification is present, without any evidence of aortic stenosis.  6. The inferior vena cava is normal in size with greater than 50% respiratory variability, suggesting right atrial pressure of 3 mmHg. FINDINGS  Left Ventricle: Left ventricular ejection fraction, by estimation, is 60 to 65%. The left ventricle has normal function. The left ventricle has no regional wall motion abnormalities. The left ventricular internal cavity size was normal in size. There is  no left ventricular hypertrophy. Left ventricular diastolic parameters were normal. Right Ventricle: The right ventricular size is  normal. No increase in right ventricular wall thickness. Right ventricular systolic function is normal. Left Atrium: Left atrial size was mildly dilated. Right Atrium: Right atrial size was normal in size. Pericardium: There is no evidence of pericardial effusion. Mitral Valve: The mitral valve is degenerative in appearance. There is mild thickening of the mitral valve leaflet(s). There is mild calcification of the mitral valve leaflet(s). Normal mobility of the mitral valve leaflets. Moderate mitral annular calcification. Trivial mitral valve regurgitation. No evidence of mitral valve stenosis. Tricuspid Valve: The tricuspid valve is normal in structure. Tricuspid valve regurgitation is trivial. No evidence of tricuspid stenosis. Aortic Valve: The aortic valve is tricuspid. Aortic valve regurgitation is not visualized. Mild to moderate aortic valve sclerosis/calcification is present, without any evidence  of aortic stenosis. Pulmonic Valve: The pulmonic valve was normal in structure. Pulmonic valve regurgitation is not visualized. No evidence of pulmonic stenosis. Aorta: The aortic root is normal in size and structure. Venous: The inferior vena cava is normal in size with greater than 50% respiratory variability, suggesting right atrial pressure of 3 mmHg. IAS/Shunts: No atrial level shunt detected by color flow Doppler.  LEFT VENTRICLE PLAX 2D LVIDd:         3.80 cm     Diastology LVIDs:         2.50 cm     LV e' lateral:   8.81 cm/s LV PW:         0.90 cm     LV E/e' lateral: 10.9 LV IVS:        1.00 cm     LV e' medial:    7.07 cm/s                            LV E/e' medial:  13.6  LV Volumes (MOD) LV vol d, MOD A4C: 46.5 ml LV vol s, MOD A4C: 17.7 ml LV SV MOD A4C:     46.5 ml RIGHT VENTRICLE             IVC RV Basal diam:  2.70 cm     IVC diam: 2.10 cm RV S prime:     10.10 cm/s TAPSE (M-mode): 1.9 cm LEFT ATRIUM           Index       RIGHT ATRIUM           Index LA diam:      4.10 cm 2.29 cm/m  RA Area:     10.80 cm LA Vol (A2C): 63.2 ml 35.33 ml/m RA Volume:   22.70 ml  12.69 ml/m LA Vol (A4C): 60.7 ml 33.93 ml/m  AORTIC VALVE LVOT Vmax:   90.20 cm/s LVOT Vmean:  60.700 cm/s LVOT VTI:    0.187 m  AORTA Ao Root diam: 3.00 cm Ao Asc diam:  2.90 cm MITRAL VALVE MV Area (PHT): 5.27 cm     SHUNTS MV Decel Time: 144 msec     Systemic VTI: 0.19 m MV E velocity: 96.00 cm/s MV A velocity: 125.00 cm/s MV E/A ratio:  0.77 Jenkins Rouge MD Electronically signed by Jenkins Rouge MD Signature Date/Time: 01/31/2020/2:20:10 PM    Final     Time Spent in minutes  89   Glennon Kopko M.D on 02/05/2020 at 5:12 PM  To page go to [email or web address] - password [facility]

## 2020-02-05 NOTE — TOC Progression Note (Signed)
Transition of Care [facility]) - Progression Note    Patient Details  Name: [name] MRN: [id number] Date of Birth: [birth year]  [facility] CM/SW Contact  Bartholomew Crews, RN Phone Number: [phone] 02/05/2020, 1:06 PM  Clinical Narrative:     Spoke with patient and spouse at bedside. Discussed updated bed offers. Initially selected [facility], however, after further consideration selected [facility]. [facility] notified. [facility] authorization for SNF updated with new facility - new reference ID# [id number]. MD and therapy notes faxed to [phone]. Additional therapy notes faxed for OT this afternoon with pending notes for PT. Covid test requested. TOC team following for transition needs.   Expected Discharge Plan: [place] Barriers to Discharge: Continued Medical Work up  Expected Discharge Plan and Services Expected Discharge Plan: [place] In-house Referral: Clinical Social Work Discharge Planning Services: CM Consult Post Acute Care Choice: [place] arrangements for the past 2 months: Single Family Home                 DME Arranged: N/A DME Agency: NA       HH Arranged: NA HH Agency: NA         Social Determinants of Health (SDOH) Interventions    Readmission Risk Interventions No flowsheet data found.

## 2020-02-05 NOTE — Progress Notes (Signed)
Occupational Therapy Treatment Patient Details Name: [name] MRN: [id number] DOB: [birth year] Today's Date: 02/05/2020    History of present illness 78 year old female with several day history of progressively worsening weakness and fever.  Found to have UTI, hypotension and transient A fib in ER  PMHx:of DMT2, HTN, CKDx stage III, diabetic PN and remote history of breast cancer    OT comments  Patient progressing towards goals. Min guard for functional ambulation and min A for functional transfers with cues for hand placement. Mod cues for safety using rolling walker, patient attempt to leave sink to walk to bathroom without AD, holding onto cabinet door. Min guard standing sink side for grooming/hygiene. Will continue to follow.  Follow Up Recommendations  SNF    Equipment Recommendations  Other (comment)(TBD)       Precautions / Restrictions Precautions Precautions: Fall;Other (comment) Precaution Comments: watch HR; falls 1-2x/wk Restrictions Weight Bearing Restrictions: No       Mobility Bed Mobility               General bed mobility comments: up in recliner pre and post session  Transfers Overall transfer level: Needs assistance Equipment used: Rolling walker (2 wheeled) Transfers: Sit to/from Stand Sit to Stand: Min assist         General transfer comment: verbal cues for hand placement and min A for eccentric control into recliner    Balance Overall balance assessment: Needs assistance         Standing balance support: Bilateral upper extremity supported;During functional activity Standing balance-Leahy Scale: Poor                             ADL either performed or assessed with clinical judgement   ADL Overall ADL's : Needs assistance/impaired     Grooming: Oral care;Wash/dry hands;Wash/dry face;Min Dispensing optician: Minimal assistance;Cueing for Education officer, community Details (indicate cue type and reason): min A due to lower toilet height, cues for safety with body mechanics Toileting- Clothing Manipulation and Hygiene: Independent;Sitting/lateral lean       Functional mobility during ADLs: Min guard;Rolling walker;Cueing for safety;Cueing for sequencing General ADL Comments: verbal cues for safety as patient attempts to walk to bathroom from sink without walker, holding onto cabinet               Cognition Arousal/Alertness: Awake/alert Behavior During Therapy: WFL for tasks assessed/performed Overall Cognitive Status: Impaired/Different from baseline Area of Impairment: Safety/judgement                         Safety/Judgement: Decreased awareness of safety;Decreased awareness of deficits     General Comments: A/Ox4                   Pertinent Vitals/ Pain       Pain Assessment: No/denies pain         Frequency  Min 2X/week        Progress Toward Goals  OT Goals(current goals can now be found in the care plan section)  Progress towards OT goals: Progressing toward goals  Acute Rehab OT Goals Patient Stated Goal: to walk OT Goal Formulation: With patient/family Time For Goal Achievement: 02/16/20 Potential to Achieve Goals: Good ADL Goals Pt Will Perform Grooming: with modified independence;standing Pt Will Perform Lower  Body Dressing: with modified independence;sitting/lateral leans;sit to/from stand Additional ADL Goal #1: Pt will state 3 fall prevention strategies in order to increase safety awareness. Additional ADL Goal #2: Pt will complete x10 mins ADL task standing at sink with 1 rest break in order to increase activity tolerance.  Plan Discharge plan remains appropriate       AM-PAC OT "6 Clicks" Daily Activity     Outcome Measure   Help from another person eating meals?: None Help from another person taking care of personal grooming?: A Little Help from another  person toileting, which includes using toliet, bedpan, or urinal?: A Little Help from another person bathing (including washing, rinsing, drying)?: A Little Help from another person to put on and taking off regular upper body clothing?: None Help from another person to put on and taking off regular lower body clothing?: A Little 6 Click Score: 20    End of Session Equipment Utilized During Treatment: Rolling walker  OT Visit Diagnosis: Unsteadiness on feet (R26.81);Muscle weakness (generalized) (M62.81)   Activity Tolerance Patient tolerated treatment well   Patient Left in chair;with call bell/phone within reach;with chair alarm set;with family/visitor present           Time: [id number] OT Time Calculation (min): 18 min  Charges: OT General Charges $OT Visit: 1 Visit OT Treatments $Self Care/Home Management : 8-22 mins  Delbert Phenix OT OT office: [place] 02/05/2020, 1:45 PM

## 2020-02-05 NOTE — Plan of Care (Signed)
  Problem: Health Behavior/Discharge Planning: Goal: Ability to manage health-related needs will improve Outcome: Progressing   Problem: Activity: Goal: Risk for activity intolerance will decrease Outcome: Progressing   Problem: Nutrition: Goal: Adequate nutrition will be maintained Outcome: Progressing   Problem: Coping: Goal: Level of anxiety will decrease Outcome: Progressing   Problem: Elimination: Goal: Will not experience complications related to bowel motility Outcome: Progressing   

## 2020-02-06 DIAGNOSIS — N390 Urinary tract infection, site not specified: Secondary | ICD-10-CM | POA: Diagnosis not present

## 2020-02-06 DIAGNOSIS — A4150 Gram-negative sepsis, unspecified: Secondary | ICD-10-CM | POA: Diagnosis not present

## 2020-02-06 DIAGNOSIS — Z743 Need for continuous supervision: Secondary | ICD-10-CM | POA: Diagnosis not present

## 2020-02-06 DIAGNOSIS — M2550 Pain in unspecified joint: Secondary | ICD-10-CM | POA: Diagnosis not present

## 2020-02-06 DIAGNOSIS — E876 Hypokalemia: Secondary | ICD-10-CM | POA: Diagnosis not present

## 2020-02-06 DIAGNOSIS — I4891 Unspecified atrial fibrillation: Secondary | ICD-10-CM | POA: Diagnosis not present

## 2020-02-06 DIAGNOSIS — R531 Weakness: Secondary | ICD-10-CM | POA: Diagnosis not present

## 2020-02-06 DIAGNOSIS — W19XXXA Unspecified fall, initial encounter: Secondary | ICD-10-CM | POA: Diagnosis not present

## 2020-02-06 DIAGNOSIS — G20 Parkinson's disease: Secondary | ICD-10-CM | POA: Diagnosis not present

## 2020-02-06 DIAGNOSIS — R5381 Other malaise: Secondary | ICD-10-CM | POA: Diagnosis not present

## 2020-02-06 DIAGNOSIS — N179 Acute kidney failure, unspecified: Secondary | ICD-10-CM | POA: Diagnosis not present

## 2020-02-06 DIAGNOSIS — Z7401 Bed confinement status: Secondary | ICD-10-CM | POA: Diagnosis not present

## 2020-02-06 LAB — COMPREHENSIVE METABOLIC PANEL
ALT: 30 U/L (ref 0–44)
AST: 48 U/L — ABNORMAL HIGH (ref 15–41)
Albumin: 2.5 g/dL — ABNORMAL LOW (ref 3.5–5.0)
Alkaline Phosphatase: 93 U/L (ref 38–126)
Anion gap: 10 (ref 5–15)
BUN: 14 mg/dL (ref 8–23)
CO2: 25 mmol/L (ref 22–32)
Calcium: 8.5 mg/dL — ABNORMAL LOW (ref 8.9–10.3)
Chloride: 103 mmol/L (ref 98–111)
Creatinine, Ser: 1.04 mg/dL — ABNORMAL HIGH (ref 0.44–1.00)
GFR calc Af Amer: 60 mL/min — ABNORMAL LOW (ref >60)
GFR calc non Af Amer: 51 mL/min — ABNORMAL LOW (ref >60)
Glucose, Bld: 196 mg/dL — ABNORMAL HIGH (ref 70–99)
Potassium: 3.1 mmol/L — ABNORMAL LOW (ref 3.5–5.1)
Sodium: 138 mmol/L (ref 135–145)
Total Bilirubin: 0.6 mg/dL (ref 0.3–1.2)
Total Protein: 6.3 g/dL — ABNORMAL LOW (ref 6.5–8.1)

## 2020-02-06 LAB — GLUCOSE, CAPILLARY
Glucose-Capillary: 111 mg/dL — ABNORMAL HIGH (ref 70–99)
Glucose-Capillary: 115 mg/dL — ABNORMAL HIGH (ref 70–99)
Glucose-Capillary: 105 mg/dL — ABNORMAL HIGH (ref 70–99)
Glucose-Capillary: 120 mg/dL — ABNORMAL HIGH (ref 70–99)

## 2020-02-06 LAB — CBC WITH DIFFERENTIAL/PLATELET
Abs Immature Granulocytes: 0.46 10*3/uL — ABNORMAL HIGH (ref 0.00–0.07)
Basophils Absolute: 0.1 10*3/uL (ref 0.0–0.1)
Basophils Relative: 1 %
Eosinophils Absolute: 0.2 10*3/uL (ref 0.0–0.5)
Eosinophils Relative: 2 %
HCT: 35.8 % — ABNORMAL LOW (ref 36.0–46.0)
Hemoglobin: 12 g/dL (ref 12.0–15.0)
Immature Granulocytes: 5 %
Lymphocytes Relative: 12 %
Lymphs Abs: 1.2 10*3/uL (ref 0.7–4.0)
MCH: 29.6 pg (ref 26.0–34.0)
MCHC: 33.5 g/dL (ref 30.0–36.0)
MCV: 88.2 fL (ref 80.0–100.0)
Monocytes Absolute: 0.6 10*3/uL (ref 0.1–1.0)
Monocytes Relative: 6 %
Neutro Abs: 7.7 10*3/uL (ref 1.7–7.7)
Neutrophils Relative %: 74 %
Platelets: 263 10*3/uL (ref 150–400)
RBC: 4.06 MIL/uL (ref 3.87–5.11)
RDW: 13.2 % (ref 11.5–15.5)
WBC: 10.1 10*3/uL (ref 4.0–10.5)
nRBC: 0.2 % (ref 0.0–0.2)

## 2020-02-06 LAB — MAGNESIUM: Magnesium: 1.5 mg/dL — ABNORMAL LOW (ref 1.7–2.4)

## 2020-02-06 LAB — BRAIN NATRIURETIC PEPTIDE: B Natriuretic Peptide: 302.6 pg/mL — ABNORMAL HIGH (ref 0.0–100.0)

## 2020-02-06 MED ORDER — AMLODIPINE BESYLATE 5 MG PO TABS: 5.0000 mg | ORAL_TABLET | Freq: Every day | ORAL | 0 refills | Status: DC

## 2020-02-06 MED ORDER — CARVEDILOL 25 MG PO TABS
25.0000 mg | ORAL_TABLET | Freq: Two times a day (BID) | ORAL | Status: DC
  Administered 2020-02-06: 25 mg via ORAL
  Filled 2020-02-06: qty 1

## 2020-02-06 MED ORDER — MAGNESIUM SULFATE 2 GM/50ML IV SOLN
2.0000 g | Freq: Once | INTRAVENOUS | Status: AC
  Administered 2020-02-06: 2 g via INTRAVENOUS
  Filled 2020-02-06: qty 50

## 2020-02-06 MED ORDER — POTASSIUM CHLORIDE CRYS ER 20 MEQ PO TBCR
40.0000 meq | EXTENDED_RELEASE_TABLET | ORAL | Status: AC
  Administered 2020-02-06 (×2): 40 meq via ORAL
  Filled 2020-02-06 (×2): qty 2

## 2020-02-06 MED ORDER — ASPIRIN 81 MG PO CHEW: 81.0000 mg | CHEWABLE_TABLET | Freq: Every day | ORAL | 0 refills | Status: AC

## 2020-02-06 MED ORDER — CARVEDILOL 25 MG PO TABS: 25.0000 mg | ORAL_TABLET | Freq: Two times a day (BID) | ORAL | 0 refills | Status: DC

## 2020-02-06 NOTE — Plan of Care (Signed)
  Problem: Health Behavior/Discharge Planning: Goal: Ability to manage health-related needs [name] improve Outcome: Progressing   Problem: Nutrition: Goal: Adequate nutrition [name] be maintained Outcome: Progressing   

## 2020-02-06 NOTE — Plan of Care (Signed)
?  Problem: Coping: ?Goal: Level of anxiety will decrease ?Outcome: Progressing ?  ?Problem: Safety: ?Goal: Ability to remain free from injury will improve ?Outcome: Progressing ?  ?

## 2020-02-06 NOTE — Discharge Summary (Addendum)
Physician Discharge Summary  [name] [id number] DOB: [birth year] DOA: 01/30/2020  PCP: Binnie Rail, MD  Admit date: 01/30/2020 Discharge date: 02/06/2020  Admitted From: Home Disposition: Skilled nursing facility  Recommendations for Outpatient Follow-up:  1. Follow up with PCP in 1-2 weeks 2. Please obtain BMP/CBC in one week  Discharge Condition: stable CODE STATUS: Full Diet recommendation: Heart Healthy / Carb Modified  Brief/Interim Summary: Patient is a 79 year old Caucasian female history of DM type II, hypertension, CKD 3, diabetic peripheral neuropathy, past history of breast cancer, IBS, GERD and Gilbert's syndrome who was admitted to the hospital for sepsis from UTI, was under ICU care, briefly required pressors, stabilized and transferred to hospitalist service on 02/02/2020.  Following is a patient hospital course in the problem list format: 1. Sepsis due to E.Coli UTI and bacteremia- was in ICU and required pressors briefly, sepsis resolved.  She received treatment with 10 days of IV ceftriaxone per ID. Repeat blood cultures no growth.  2.  Brief run of atrial fibrillation in ICU while she was on pressor.  Per ICU MD Dr. Lynetta Mare resolved after a brief run, her [place] vas 2 score certainly is greater than 3 however this seems to be in the setting of a reversible cause, echo is stable with preserved EF and no wall motion abnormality, she has no history of stroke, TSH normal.  For now aspirin 81 mg along with beta-blocker.  In case patient was discharged home, upon discharge we were considering 30-day event monitor and follow-up with cardiology, if there is further recurrence of atrial fibrillation then certainly she requires full anticoagulation and further work-up.  However, now the patient is being discharged to skilled nursing facility.  Would recommend to monitor closely in the skilled nursing facility and defer further investigation regarding the event monitor to  the skilled nursing facility.  3.  Generalized weakness.  Due to #1 above.  PT OT eval, patient agreeable for SNF.  4.  Possible cirrhosis on her CT scan.  LFTs remain stable, total bili is high but she carries a diagnosis of Gilbert's syndrome, INR was borderline at 1.4, will defer further work-up to PCP outpatient she may benefit from GI evaluation post discharge.  Question if she has underlying NASH.  5.  History of Parkinson's.  Continue combination of carbidopa levodopa.  6.  Essential hypertension.  Currently blood pressure stable on Coreg and Norvasc.  She also was on Hyzaar and clonidine which were held at the time of admission due to sepsis, hypotension, acute on chronic renal failure. Continue to monitor and adjust medications as needed.  At some point once her kidney functions have stabilized she will need to be started on ACE/ARB given that she is diabetic.  7.  AKI on CKD 2.  Initially had acute on chronic renal failure.  Currently creatinine improved.  Baseline creatinine around 1.2.    8.  DM type II.  On Metformin. Continue to monitor Accu-Cheks and adjust medications as needed. Patient has been afebrile without any complaints and is okay being discharged to the skilled nursing facility.  9.  Hypokalemia/hypomagnesemia: Electrolytes have been replaced.  Please monitor at the facility and replace as needed.  Discharge Diagnoses:  Principal Problem:   Sepsis due to gram-negative bacteria ([place]) Active Problems:   Type 2 diabetes mellitus with stage 3 chronic kidney disease, without long-term current use of insulin (HCC)   Essential hypertension   Lactic acidosis   Complicated UTI (urinary tract infection)  Parkinsonian features   Acute renal failure superimposed on stage 3 chronic kidney disease (HCC)   Severe sepsis with acute organ dysfunction due to gram-negative bacteria (HCC)   Hypokalemia due to inadequate potassium intake   Severe sepsis with acute organ  dysfunction due to Gram negative bacteria ([place])   Septic shock [facility])    Discharge Instructions Discharge plan of care discussed with the patient.  Answered all questions appropriately to the best of my knowledge.  She verbalized understanding and is being discharged to skilled nursing facility in stable condition.  Allergies as of 02/06/2020      Reactions   Sulfur    Flushed, funny feeling in throat    Exemestane Nausea Only   Other reaction(s): Dizziness (intolerance)   Morphine And Related    Statins Other (See Comments)   Elevated LFTs   Ace Inhibitors    REACTION: COUGH   Codeine    REACTION: VOMITTING      Medication List    STOP taking these medications   cloNIDine 0.2 MG tablet Commonly known as: CATAPRES   losartan-hydrochlorothiazide 100-12.5 MG tablet Commonly known as: HYZAAR     TAKE these medications   amLODipine 5 MG tablet Commonly known as: NORVASC Take 1 tablet (5 mg total) by mouth daily. Start taking on: Feb 07, 2020   aspirin 81 MG chewable tablet Chew 1 tablet (81 mg total) by mouth daily. Start taking on: Feb 07, 2020   carbidopa-levodopa 25-100 MG tablet Commonly known as: SINEMET IR TAKE 1 TABLET BY MOUTH 3 TIMES A DAY. What changed:   how much to take  how to take this  when to take this  additional instructions   carvedilol 25 MG tablet Commonly known as: COREG Take 1 tablet (25 mg total) by mouth 2 (two) times daily with a meal.   Fish Oil Concentrate 1000 MG Caps Take 1,000 mg by mouth 2 (two) times daily.   metFORMIN 500 MG tablet Commonly known as: GLUCOPHAGE TAKE 2 TABLETS TWICE A DAY WITH MEALS. What changed:   how much to take  how to take this  when to take this  additional instructions   multivitamin with minerals Tabs tablet Take 1 tablet by mouth daily.   Vitamin B-12 5000 MCG Subl Place 5,000 mcg under the tongue daily.      Contact information for after-discharge care    Destination     [facility] .   Service: Skilled Nursing Contact information: [address] [place] [phone]             Allergies  Allergen Reactions  . Sulfur     Flushed, funny feeling in throat   . Exemestane Nausea Only    Other reaction(s): Dizziness (intolerance)  . Morphine And Related   . Statins Other (See Comments)    Elevated LFTs  . Ace Inhibitors     REACTION: COUGH  . Codeine     REACTION: VOMITTING    Procedures/Studies: CT ABDOMEN PELVIS WO CONTRAST  Result Date: 01/31/2020 CLINICAL DATA:  Sepsis, complicated UTI. Evaluate for pyelonephritis or infected stone. EXAM: CT ABDOMEN AND PELVIS WITHOUT CONTRAST TECHNIQUE: Multidetector CT imaging of the abdomen and pelvis was performed following the standard protocol without IV contrast. COMPARISON:  CT abdomen dated 01/04/2010. FINDINGS: Lower chest: No acute abnormality. Hepatobiliary: Peripheral liver contours are slightly nodular suggesting cirrhosis. No focal liver abnormality is seen. Gallbladder is unremarkable. No  bile duct dilatation seen. Pancreas: Unremarkable. No pancreatic ductal dilatation or surrounding inflammatory changes. Spleen: Normal in size without focal abnormality. Adrenals/Urinary Tract: Adrenal glands appear normal. Kidneys are unremarkable without mass, stone or hydronephrosis. No ureteral or bladder calculi are identified. Bladder is unremarkable, partially decompressed. Stomach/Bowel: No dilated large or small bowel loops. No evidence of bowel wall inflammation. Appendix is not seen but there are no inflammatory changes about the cecum to suggest acute appendicitis. Stomach is unremarkable, decompressed. Vascular/Lymphatic: Aortic atherosclerosis. No enlarged lymph nodes seen. Reproductive: Presumed hysterectomy. No adnexal mass or free fluid. Other: No free fluid or abscess collection seen. No free intraperitoneal air.  Musculoskeletal: Mild degenerative spondylosis of the slightly scoliotic thoracolumbar spine. Superficial soft tissues are unremarkable. IMPRESSION: 1. No acute findings within the abdomen or pelvis. No bowel obstruction or evidence of bowel wall inflammation. No renal or ureteral calculi. No evidence of pyelonephritis. 2. Cirrhotic appearing liver. Aortic Atherosclerosis (ICD10-I70.0). Electronically Signed   By: Franki Cabot M.D.   On: 01/31/2020 05:45   DG CHEST PORT 1 VIEW  Result Date: 01/31/2020 CLINICAL DATA:  Desaturation EXAM: PORTABLE CHEST 1 VIEW COMPARISON:  Radiograph 01/31/2020 FINDINGS: Chronically coarsened basilar interstitial changes and bronchitic features are similar to comparison studies likely accentuated by some basilar atelectasis. Apical lucency compatible with emphysematous changes seen on prior cross-sectional imaging. No consolidation, features of edema, pneumothorax, or effusion. The aorta is calcified. The remaining cardiomediastinal contours are unremarkable. No acute osseous or soft tissue abnormality. Degenerative changes are present in the imaged spine and shoulders. Mild levocurvature of the spine. IMPRESSION: 1. Mild atelectasis, otherwise no acute cardiopulmonary abnormality. 2. Chronically coarsened basilar interstitial changes and bronchitic features are similar to comparison studies. 3. 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 Electronically Signed   By: Lovena Le M.D.   On: 01/31/2020 15:45   DG Chest Port 1 View  Result Date: 01/31/2020 CLINICAL DATA:  New atrial flutter. Fever and syncope. EXAM: PORTABLE CHEST 1 VIEW COMPARISON:  Chest x-rays dated 01/31/2020 and 10/11/2010. FINDINGS: Heart size and mediastinal contours are within normal limits. Lungs are clear. No pleural effusion or pneumothorax is seen. Osseous structures about the chest are unremarkable. IMPRESSION: No active disease. No evidence of pneumonia or pulmonary edema. Electronically Signed   By:  Franki Cabot M.D.   On: 01/31/2020 04:26   DG Chest Port 1 View  Result Date: 01/31/2020 CLINICAL DATA:  Sepsis EXAM: PORTABLE CHEST 1 VIEW COMPARISON:  None. FINDINGS: The heart size and mediastinal contours are within normal limits. Both lungs are clear. The visualized skeletal structures are unremarkable. IMPRESSION: No active disease. Electronically Signed   By: Prudencio Pair M.D.   On: 01/31/2020 00:39   ECHOCARDIOGRAM COMPLETE  Result Date: 01/31/2020    ECHOCARDIOGRAM REPORT   Patient Name:   [name] Date of Exam: 01/31/2020 Medical Rec #:  [id number]          Height:       64.0 in Accession #:    [id number]         Weight:       162.0 lb Date of Birth:  1941/03/18          BSA:          1.789 m Patient Age:    57 years           BP:           106/51 mmHg Patient Gender: F  HR:           91 bpm. Exam Location:  Inpatient Procedure: 2D Echo, Cardiac Doppler and Color Doppler Indications:    I48.91* Unspeicified atrial fibrillation  History:        Patient has prior history of Echocardiogram examinations, most                 recent 09/01/2008.  Sonographer:    Merrie Roof RDCS Referring Phys: [place]  1. Left ventricular ejection fraction, by estimation, is 60 to 65%. The left ventricle has normal function. The left ventricle has no regional wall motion abnormalities. Left ventricular diastolic parameters were normal.  2. Right ventricular systolic function is normal. The right ventricular size is normal.  3. Left atrial size was mildly dilated.  4. The mitral valve is degenerative. Trivial mitral valve regurgitation. No evidence of mitral stenosis.  5. The aortic valve is tricuspid. Aortic valve regurgitation is not visualized. Mild to moderate aortic valve sclerosis/calcification is present, without any evidence of aortic stenosis.  6. The inferior vena cava is normal in size with greater than 50% respiratory variability, suggesting right atrial pressure  of 3 mmHg. FINDINGS  Left Ventricle: Left ventricular ejection fraction, by estimation, is 60 to 65%. The left ventricle has normal function. The left ventricle has no regional wall motion abnormalities. The left ventricular internal cavity size was normal in size. There is  no left ventricular hypertrophy. Left ventricular diastolic parameters were normal. Right Ventricle: The right ventricular size is normal. No increase in right ventricular wall thickness. Right ventricular systolic function is normal. Left Atrium: Left atrial size was mildly dilated. Right Atrium: Right atrial size was normal in size. Pericardium: There is no evidence of pericardial effusion. Mitral Valve: The mitral valve is degenerative in appearance. There is mild thickening of the mitral valve leaflet(s). There is mild calcification of the mitral valve leaflet(s). Normal mobility of the mitral valve leaflets. Moderate mitral annular calcification. Trivial mitral valve regurgitation. No evidence of mitral valve stenosis. Tricuspid Valve: The tricuspid valve is normal in structure. Tricuspid valve regurgitation is trivial. No evidence of tricuspid stenosis. Aortic Valve: The aortic valve is tricuspid. Aortic valve regurgitation is not visualized. Mild to moderate aortic valve sclerosis/calcification is present, without any evidence of aortic stenosis. Pulmonic Valve: The pulmonic valve was normal in structure. Pulmonic valve regurgitation is not visualized. No evidence of pulmonic stenosis. Aorta: The aortic root is normal in size and structure. Venous: The inferior vena cava is normal in size with greater than 50% respiratory variability, suggesting right atrial pressure of 3 mmHg. IAS/Shunts: No atrial level shunt detected by color flow Doppler.  LEFT VENTRICLE PLAX 2D LVIDd:         3.80 cm     Diastology LVIDs:         2.50 cm     LV e' lateral:   8.81 cm/s LV PW:         0.90 cm     LV E/e' lateral: 10.9 LV IVS:        1.00 cm     LV e'  medial:    7.07 cm/s                            LV E/e' medial:  13.6  LV Volumes (MOD) LV vol d, MOD A4C: 46.5 ml LV vol s, MOD A4C: 17.7 ml LV SV MOD A4C:  46.5 ml RIGHT VENTRICLE             IVC RV Basal diam:  2.70 cm     IVC diam: 2.10 cm RV S prime:     10.10 cm/s TAPSE (M-mode): 1.9 cm LEFT ATRIUM           Index       RIGHT ATRIUM           Index LA diam:      4.10 cm 2.29 cm/m  RA Area:     10.80 cm LA Vol (A2C): 63.2 ml 35.33 ml/m RA Volume:   22.70 ml  12.69 ml/m LA Vol (A4C): 60.7 ml 33.93 ml/m  AORTIC VALVE LVOT Vmax:   90.20 cm/s LVOT Vmean:  60.700 cm/s LVOT VTI:    0.187 m  AORTA Ao Root diam: 3.00 cm Ao Asc diam:  2.90 cm MITRAL VALVE MV Area (PHT): 5.27 cm     SHUNTS MV Decel Time: 144 msec     Systemic VTI: 0.19 m MV E velocity: 96.00 cm/s MV A velocity: 125.00 cm/s MV E/A ratio:  0.77 Jenkins Rouge MD Electronically signed by Jenkins Rouge MD Signature Date/Time: 01/31/2020/2:20:10 PM    Final      Subjective: She denies having any complaints at this time.  Discharge Exam: Vitals:   02/06/20 0517 02/06/20 0937  BP: (!) 160/74 (!) 151/64  Pulse: 84 87  Resp: 12 16  Temp: 98.1 F (36.7 C) 98.7 F (37.1 C)  SpO2: 98% 97%   Vitals:   02/05/20 1640 02/05/20 2047 02/06/20 0517 02/06/20 0937  BP: (!) 161/75 133/64 (!) 160/74 (!) 151/64  Pulse: 81 87 84 87  Resp: 18 14 12 16   Temp: 99 F (37.2 C) 98.8 F (37.1 C) 98.1 F (36.7 C) 98.7 F (37.1 C)  TempSrc: Oral Oral Oral   SpO2: 99% 96% 98% 97%  Weight:      Height:        General: Pt is alert, awake, not in acute distress Cardiovascular: RRR, S1/S2 +, no rubs, no gallops Respiratory: CTA bilaterally, no wheezing, no rhonchi Abdominal: Soft, NT, ND, bowel sounds + Extremities: no edema, no cyanosis    The results of significant diagnostics from this hospitalization (including imaging, microbiology, ancillary and laboratory) are listed below for reference.     Microbiology: Recent Results (from the past  240 hour(s))  Blood Culture (routine x 2)     Status: Abnormal   Collection Time: 01/31/20 12:22 AM   Specimen: BLOOD  Result Value Ref Range Status   Specimen Description BLOOD RIGHT ANTECUBITAL  Final   Special Requests   Final    BOTTLES DRAWN AEROBIC AND ANAEROBIC Blood Culture results may not be optimal due to an excessive volume of blood received in culture bottles   Culture  Setup Time   Final    GRAM NEGATIVE RODS IN BOTH AEROBIC AND ANAEROBIC BOTTLES CRITICAL RESULT CALLED TO, READ BACK BY AND VERIFIED WITH: PHATMD L CRRAU [id number] AT 33 B Y CM Performed at [facility], [place] [address], [place], [place] [address]    Culture ESCHERICHIA COLI (A)  Final   Report Status 02/02/2020 FINAL  Final   Organism ID, Bacteria ESCHERICHIA COLI  Final      Susceptibility   Escherichia coli - MIC*    AMPICILLIN >=32 RESISTANT Resistant     CEFAZOLIN >=64 RESISTANT Resistant     CEFEPIME <=1 SENSITIVE Sensitive  CEFTAZIDIME <=1 SENSITIVE Sensitive     CEFTRIAXONE <=1 SENSITIVE Sensitive     CIPROFLOXACIN <=0.25 SENSITIVE Sensitive     GENTAMICIN <=1 SENSITIVE Sensitive     IMIPENEM <=0.25 SENSITIVE Sensitive     TRIMETH/SULFA <=20 SENSITIVE Sensitive     AMPICILLIN/SULBACTAM >=32 RESISTANT Resistant     PIP/TAZO 64 INTERMEDIATE Intermediate     * ESCHERICHIA COLI  Blood Culture ID Panel (Reflexed)     Status: Abnormal   Collection Time: 01/31/20 12:22 AM  Result Value Ref Range Status   Enterococcus species NOT DETECTED NOT DETECTED Final   Listeria monocytogenes NOT DETECTED NOT DETECTED Final   Staphylococcus species NOT DETECTED NOT DETECTED Final   Staphylococcus aureus (BCID) NOT DETECTED NOT DETECTED Final   Streptococcus species NOT DETECTED NOT DETECTED Final   Streptococcus agalactiae NOT DETECTED NOT DETECTED Final   Streptococcus pneumoniae NOT DETECTED NOT DETECTED Final   Streptococcus pyogenes NOT DETECTED NOT DETECTED Final   Acinetobacter baumannii NOT  DETECTED NOT DETECTED Final   Enterobacteriaceae species DETECTED (A) NOT DETECTED Final    Comment: Enterobacteriaceae represent a large family of gram-negative bacteria, not a single organism. CRITICAL RESULT CALLED TO, READ BACK BY AND VERIFIED WITH: PHARMD L CRRAU [id number] AT 1420 BY CM    Enterobacter cloacae complex NOT DETECTED NOT DETECTED Final   Escherichia coli DETECTED (A) NOT DETECTED Final    Comment: CRITICAL RESULT CALLED TO, READ BACK BY AND VERIFIED WITH: PHARMD L CRRAU [id number] AT 1620 BY CM    Klebsiella oxytoca NOT DETECTED NOT DETECTED Final   Klebsiella pneumoniae NOT DETECTED NOT DETECTED Final   Proteus species NOT DETECTED NOT DETECTED Final   Serratia marcescens NOT DETECTED NOT DETECTED Final   Carbapenem resistance NOT DETECTED NOT DETECTED Final   Haemophilus influenzae NOT DETECTED NOT DETECTED Final   Neisseria meningitidis NOT DETECTED NOT DETECTED Final   Pseudomonas aeruginosa NOT DETECTED NOT DETECTED Final   Candida albicans NOT DETECTED NOT DETECTED Final   Candida glabrata NOT DETECTED NOT DETECTED Final   Candida krusei NOT DETECTED NOT DETECTED Final   Candida parapsilosis NOT DETECTED NOT DETECTED Final   Candida tropicalis NOT DETECTED NOT DETECTED Final    Comment: Performed at [facility], [place]. [address], [place], [place] [address]  Blood Culture (routine x 2)     Status: Abnormal   Collection Time: 01/31/20 12:23 AM   Specimen: BLOOD LEFT WRIST  Result Value Ref Range Status   Specimen Description BLOOD LEFT WRIST  Final   Special Requests   Final    BOTTLES DRAWN AEROBIC ONLY Blood Culture adequate volume   Culture  Setup Time   Final    GRAM NEGATIVE RODS AEROBIC BOTTLE ONLY CRITICAL VALUE NOTED.  VALUE IS CONSISTENT WITH PREVIOUSLY REPORTED AND CALLED VALUE.    Culture (A)  Final    ESCHERICHIA COLI SUSCEPTIBILITIES PERFORMED ON PREVIOUS CULTURE WITHIN THE LAST 5 DAYS. Performed at [facility], [place] [address]  [place], [place] [address]    Report Status 02/02/2020 FINAL  Final  Urine culture     Status: Abnormal   Collection Time: 01/31/20  1:51 AM   Specimen: Urine, Clean Catch  Result Value Ref Range Status   Specimen Description URINE, CLEAN CATCH  Final   Special Requests   Final    NONE Performed at [facility], [place] [address] [place], [place] [address]    Culture >=100,000 COLONIES/mL ESCHERICHIA COLI (A)  Final   Report Status 02/02/2020 FINAL  Final   Organism ID, Bacteria ESCHERICHIA COLI (A)  Final      Susceptibility   Escherichia coli - MIC*    AMPICILLIN >=32 RESISTANT Resistant     CEFAZOLIN >=64 RESISTANT Resistant     CEFTRIAXONE <=1 SENSITIVE Sensitive     CIPROFLOXACIN <=0.25 SENSITIVE Sensitive     GENTAMICIN <=1 SENSITIVE Sensitive     IMIPENEM <=0.25 SENSITIVE Sensitive     NITROFURANTOIN <=16 SENSITIVE Sensitive     TRIMETH/SULFA <=20 SENSITIVE Sensitive     AMPICILLIN/SULBACTAM >=32 RESISTANT Resistant     PIP/TAZO 16 SENSITIVE Sensitive     * >=100,000 COLONIES/mL ESCHERICHIA COLI  SARS Coronavirus 2 by RT PCR (hospital order, performed in [place] hospital lab) Nasopharyngeal Nasopharyngeal Swab     Status: None   Collection Time: 01/31/20  1:51 AM   Specimen: Nasopharyngeal Swab  Result Value Ref Range Status   SARS Coronavirus 2 NEGATIVE NEGATIVE Final    Comment: (NOTE) SARS-CoV-2 target nucleic acids are NOT DETECTED. The SARS-CoV-2 RNA is generally detectable in upper and lower respiratory specimens during the acute phase of infection. The lowest concentration of SARS-CoV-2 viral copies this assay can detect is 250 copies / mL. A negative result does not preclude SARS-CoV-2 infection and should not be used as the sole basis for treatment or other patient management decisions.  A negative result may occur with improper specimen collection / handling, submission of specimen other than nasopharyngeal swab, presence of viral mutation(s) within  the areas targeted by this assay, and inadequate number of viral copies (<250 copies / mL). A negative result must be combined with clinical observations, patient history, and epidemiological information. Fact Sheet for Patients:   StrictlyIdeas.no Fact Sheet for Healthcare Providers: BankingDealers.co.za This test is not yet approved or cleared  by the [place] FDA and has been authorized for detection and/or diagnosis of SARS-CoV-2 by FDA under an Emergency Use Authorization (EUA).  This EUA will remain in effect (meaning this test can be used) for the duration of the COVID-19 declaration under Section 564(b)(1) of the Act, 21 U.S.C. section 360bbb-3(b)(1), unless the authorization is terminated or revoked sooner. Performed at [facility], [place] [address] [place], [place] [address]   MRSA PCR Screening     Status: None   Collection Time: 01/31/20  4:35 PM   Specimen: Nasopharyngeal  Result Value Ref Range Status   MRSA by PCR NEGATIVE NEGATIVE Final    Comment:        The GeneXpert MRSA Assay (FDA approved for NASAL specimens only), is one component of a comprehensive MRSA colonization surveillance program. It is not intended to diagnose MRSA infection nor to guide or monitor treatment for MRSA infections. Performed at [facility], [place] [address], [place], [place] [address]   Culture, blood (routine x 2)     Status: None (Preliminary result)   Collection Time: 02/04/20  2:32 PM   Specimen: BLOOD  Result Value Ref Range Status   Specimen Description BLOOD LEFT ANTECUBITAL  Final   Special Requests   Final    BOTTLES DRAWN AEROBIC AND ANAEROBIC Blood Culture adequate volume   Culture   Final    NO GROWTH 2 DAYS Performed at [facility], [place] [address], [place], [place] [address]    Report Status PENDING  Incomplete  Culture, blood (routine x 2)     Status: None (Preliminary result)  Collection  Time: 02/04/20  2:37 PM   Specimen: BLOOD RIGHT HAND  Result Value Ref Range Status   Specimen Description BLOOD RIGHT HAND  Final   Special Requests   Final    BOTTLES DRAWN AEROBIC AND ANAEROBIC Blood Culture adequate volume   Culture   Final    NO GROWTH 2 DAYS Performed at [facility], [address] [place], [place] [address]    Report Status PENDING  Incomplete  SARS CORONAVIRUS 2 (TAT 6-24 HRS) Nasopharyngeal Nasopharyngeal Swab     Status: None   Collection Time: 02/05/20 11:35 AM   Specimen: Nasopharyngeal Swab  Result Value Ref Range Status   SARS Coronavirus 2 NEGATIVE NEGATIVE Final    Comment: (NOTE) SARS-CoV-2 target nucleic acids are NOT DETECTED. The SARS-CoV-2 RNA is generally detectable in upper and lower respiratory specimens during the acute phase of infection. Negative results do not preclude SARS-CoV-2 infection, do not rule out co-infections with other pathogens, and should not be used as the sole basis for treatment or other patient management decisions. Negative results must be combined with clinical observations, patient history, and epidemiological information. The expected result is Negative. Fact Sheet for Patients: SugarRoll.be Fact Sheet for Healthcare Providers: https://www.woods-mathews.com/ This test is not yet approved or cleared by the [place] FDA and  has been authorized for detection and/or diagnosis of SARS-CoV-2 by FDA under an Emergency Use Authorization (EUA). This EUA will remain  in effect (meaning this test can be used) for the duration of the COVID-19 declaration under Section 56 4(b)(1) of the Act, 21 U.S.C. section 360bbb-3(b)(1), unless the authorization is terminated or revoked sooner. Performed at [facility], [place] [address], [place], [place] [address]      Labs: BNP (last 3 results) Recent Labs    02/04/20 0457 02/05/20 0432 02/06/20 0838  BNP 580.1* 655.4*  123XX123*   Basic Metabolic Panel: Recent Labs  Lab 02/02/20 0500 02/03/20 0444 02/04/20 0457 02/05/20 0432 02/06/20 0838  NA 143 144 145 143 138  K 3.3* 3.5 3.9 3.9 3.1*  CL 113* 113* 110 108 103  CO2 20* 24 24 26 25   GLUCOSE 142* 136* 134* 123* 196*  BUN 21 18 16 16 14   CREATININE 1.33* 1.17* 1.10* 1.17* 1.04*  CALCIUM 8.0* 8.1* 8.5* 8.4* 8.5*  MG 2.0 1.7 1.8 1.6* 1.5*   Liver Function Tests: Recent Labs  Lab 01/31/20 0506 02/03/20 0444 02/04/20 0457 02/05/20 0432 02/06/20 0838  AST 32 30 52* 55* 48*  ALT 26 7 8 9 30   ALKPHOS 65 55 75 80 93  BILITOT 1.9* 0.7 0.9 0.6 0.6  PROT 5.5* 5.4* 5.8* 5.4* 6.3*  ALBUMIN 2.3* 2.0* 2.2* 2.1* 2.5*   No results for input(s): LIPASE, AMYLASE in the last 168 hours. No results for input(s): AMMONIA in the last 168 hours. CBC: Recent Labs  Lab 02/02/20 0500 02/03/20 0444 02/04/20 0457 02/05/20 0432 02/06/20 0838  WBC 9.5 8.5 9.3 9.4 10.1  NEUTROABS 7.3 6.3 7.0 6.6 7.7  HGB 10.8* 10.3* 11.4* 10.2* 12.0  HCT 32.3* 31.0* 34.4* 31.0* 35.8*  MCV 88.3 88.3 89.1 88.6 88.2  PLT 139* 159 193 213 263   Cardiac Enzymes: No results for input(s): CKTOTAL, CKMB, CKMBINDEX, TROPONINI in the last 168 hours. BNP: Invalid input(s): POCBNP CBG: Recent Labs  Lab 02/05/20 1134 02/05/20 1637 02/05/20 2045 02/06/20 0644 02/06/20 1153  GLUCAP 113* 81 99 111* 115*   D-Dimer No results for input(s): DDIMER in the  last 72 hours. Hgb A1c No results for input(s): HGBA1C in the last 72 hours. Lipid Profile No results for input(s): CHOL, HDL, LDLCALC, TRIG, CHOLHDL, LDLDIRECT in the last 72 hours. Thyroid function studies No results for input(s): TSH, T4TOTAL, T3FREE, THYROIDAB in the last 72 hours.  Invalid input(s): FREET3 Anemia work up No results for input(s): VITAMINB12, FOLATE, FERRITIN, TIBC, IRON, RETICCTPCT in the last 72 hours. Urinalysis    Component Value Date/Time   COLORURINE YELLOW 01/31/2020 0151   APPEARANCEUR CLOUDY  (A) 01/31/2020 0151   LABSPEC 1.011 01/31/2020 0151   PHURINE 5.0 01/31/2020 0151   GLUCOSEU NEGATIVE 01/31/2020 0151   HGBUR MODERATE (A) 01/31/2020 0151   HGBUR negative 11/17/2010 1626   BILIRUBINUR NEGATIVE 01/31/2020 0151   KETONESUR 5 (A) 01/31/2020 0151   PROTEINUR 100 (A) 01/31/2020 0151   UROBILINOGEN negative 11/17/2010 1626   NITRITE NEGATIVE 01/31/2020 0151   LEUKOCYTESUR LARGE (A) 01/31/2020 0151   Sepsis Labs Invalid input(s): PROCALCITONIN,  WBC,  LACTICIDVEN Microbiology Recent Results (from the past 240 hour(s))  Blood Culture (routine x 2)     Status: Abnormal   Collection Time: 01/31/20 12:22 AM   Specimen: BLOOD  Result Value Ref Range Status   Specimen Description BLOOD RIGHT ANTECUBITAL  Final   Special Requests   Final    BOTTLES DRAWN AEROBIC AND ANAEROBIC Blood Culture results may not be optimal due to an excessive volume of blood received in culture bottles   Culture  Setup Time   Final    GRAM NEGATIVE RODS IN BOTH AEROBIC AND ANAEROBIC BOTTLES CRITICAL RESULT CALLED TO, READ BACK BY AND VERIFIED WITH: PHATMD L CRRAU [id number] AT 70 B Y CM Performed at [facility], [place] [address], [place], [place] [address]    Culture ESCHERICHIA COLI (A)  Final   Report Status 02/02/2020 FINAL  Final   Organism ID, Bacteria ESCHERICHIA COLI  Final      Susceptibility   Escherichia coli - MIC*    AMPICILLIN >=32 RESISTANT Resistant     CEFAZOLIN >=64 RESISTANT Resistant     CEFEPIME <=1 SENSITIVE Sensitive     CEFTAZIDIME <=1 SENSITIVE Sensitive     CEFTRIAXONE <=1 SENSITIVE Sensitive     CIPROFLOXACIN <=0.25 SENSITIVE Sensitive     GENTAMICIN <=1 SENSITIVE Sensitive     IMIPENEM <=0.25 SENSITIVE Sensitive     TRIMETH/SULFA <=20 SENSITIVE Sensitive     AMPICILLIN/SULBACTAM >=32 RESISTANT Resistant     PIP/TAZO 64 INTERMEDIATE Intermediate     * ESCHERICHIA COLI  Blood Culture ID Panel (Reflexed)     Status: Abnormal   Collection Time: 01/31/20 12:22 AM   Result Value Ref Range Status   Enterococcus species NOT DETECTED NOT DETECTED Final   Listeria monocytogenes NOT DETECTED NOT DETECTED Final   Staphylococcus species NOT DETECTED NOT DETECTED Final   Staphylococcus aureus (BCID) NOT DETECTED NOT DETECTED Final   Streptococcus species NOT DETECTED NOT DETECTED Final   Streptococcus agalactiae NOT DETECTED NOT DETECTED Final   Streptococcus pneumoniae NOT DETECTED NOT DETECTED Final   Streptococcus pyogenes NOT DETECTED NOT DETECTED Final   Acinetobacter baumannii NOT DETECTED NOT DETECTED Final   Enterobacteriaceae species DETECTED (A) NOT DETECTED Final    Comment: Enterobacteriaceae represent a large family of gram-negative bacteria, not a single organism. CRITICAL RESULT CALLED TO, READ BACK BY AND VERIFIED WITH: PHARMD L CRRAU [id number] AT 1420 BY CM    Enterobacter cloacae complex NOT DETECTED NOT DETECTED Final  Escherichia coli DETECTED (A) NOT DETECTED Final    Comment: CRITICAL RESULT CALLED TO, READ BACK BY AND VERIFIED WITH: PHARMD L CRRAU [id number] AT 1620 BY CM    Klebsiella oxytoca NOT DETECTED NOT DETECTED Final   Klebsiella pneumoniae NOT DETECTED NOT DETECTED Final   Proteus species NOT DETECTED NOT DETECTED Final   Serratia marcescens NOT DETECTED NOT DETECTED Final   Carbapenem resistance NOT DETECTED NOT DETECTED Final   Haemophilus influenzae NOT DETECTED NOT DETECTED Final   Neisseria meningitidis NOT DETECTED NOT DETECTED Final   Pseudomonas aeruginosa NOT DETECTED NOT DETECTED Final   Candida albicans NOT DETECTED NOT DETECTED Final   Candida glabrata NOT DETECTED NOT DETECTED Final   Candida krusei NOT DETECTED NOT DETECTED Final   Candida parapsilosis NOT DETECTED NOT DETECTED Final   Candida tropicalis NOT DETECTED NOT DETECTED Final    Comment: Performed at [facility], [place] [address], [place], [place] [address]  Blood Culture (routine x 2)     Status: Abnormal   Collection Time: 01/31/20 12:23 AM    Specimen: BLOOD LEFT WRIST  Result Value Ref Range Status   Specimen Description BLOOD LEFT WRIST  Final   Special Requests   Final    BOTTLES DRAWN AEROBIC ONLY Blood Culture adequate volume   Culture  Setup Time   Final    GRAM NEGATIVE RODS AEROBIC BOTTLE ONLY CRITICAL VALUE NOTED.  VALUE IS CONSISTENT WITH PREVIOUSLY REPORTED AND CALLED VALUE.    Culture (A)  Final    ESCHERICHIA COLI SUSCEPTIBILITIES PERFORMED ON PREVIOUS CULTURE WITHIN THE LAST 5 DAYS. Performed at [facility], [place] [address], [place], [place] [address]    Report Status 02/02/2020 FINAL  Final  Urine culture     Status: Abnormal   Collection Time: 01/31/20  1:51 AM   Specimen: Urine, Clean Catch  Result Value Ref Range Status   Specimen Description URINE, CLEAN CATCH  Final   Special Requests   Final    NONE Performed at [facility], [place] [address], [place], [place] [address]    Culture >=100,000 COLONIES/mL ESCHERICHIA COLI (A)  Final   Report Status 02/02/2020 FINAL  Final   Organism ID, Bacteria ESCHERICHIA COLI (A)  Final      Susceptibility   Escherichia coli - MIC*    AMPICILLIN >=32 RESISTANT Resistant     CEFAZOLIN >=64 RESISTANT Resistant     CEFTRIAXONE <=1 SENSITIVE Sensitive     CIPROFLOXACIN <=0.25 SENSITIVE Sensitive     GENTAMICIN <=1 SENSITIVE Sensitive     IMIPENEM <=0.25 SENSITIVE Sensitive     NITROFURANTOIN <=16 SENSITIVE Sensitive     TRIMETH/SULFA <=20 SENSITIVE Sensitive     AMPICILLIN/SULBACTAM >=32 RESISTANT Resistant     PIP/TAZO 16 SENSITIVE Sensitive     * >=100,000 COLONIES/mL ESCHERICHIA COLI  SARS Coronavirus 2 by RT PCR (hospital order, performed in [facility] hospital lab) Nasopharyngeal Nasopharyngeal Swab     Status: None   Collection Time: 01/31/20  1:51 AM   Specimen: Nasopharyngeal Swab  Result Value Ref Range Status   SARS Coronavirus 2 NEGATIVE NEGATIVE Final    Comment: (NOTE) SARS-CoV-2 target nucleic acids are NOT DETECTED. The SARS-CoV-2  RNA is generally detectable in upper and lower respiratory specimens during the acute phase of infection. The lowest concentration of SARS-CoV-2 viral copies this assay can detect is 250 copies / mL. A negative result does not preclude SARS-CoV-2 infection and should not be used as  the sole basis for treatment or other patient management decisions.  A negative result may occur with improper specimen collection / handling, submission of specimen other than nasopharyngeal swab, presence of viral mutation(s) within the areas targeted by this assay, and inadequate number of viral copies (<250 copies / mL). A negative result must be combined with clinical observations, patient history, and epidemiological information. Fact Sheet for Patients:   StrictlyIdeas.no Fact Sheet for Healthcare Providers: BankingDealers.co.za This test is not yet approved or cleared  by the [place] FDA and has been authorized for detection and/or diagnosis of SARS-CoV-2 by FDA under an Emergency Use Authorization (EUA).  This EUA will remain in effect (meaning this test can be used) for the duration of the COVID-19 declaration under Section 564(b)(1) of the Act, 21 U.S.C. section 360bbb-3(b)(1), unless the authorization is terminated or revoked sooner. Performed at [facility], [place] [address], [place], [place] [address]   MRSA PCR Screening     Status: None   Collection Time: 01/31/20  4:35 PM   Specimen: Nasopharyngeal  Result Value Ref Range Status   MRSA by PCR NEGATIVE NEGATIVE Final    Comment:        The GeneXpert MRSA Assay (FDA approved for NASAL specimens only), is one component of a comprehensive MRSA colonization surveillance program. It is not intended to diagnose MRSA infection nor to guide or monitor treatment for MRSA infections. Performed at [facility], [place] [address], [place], [place] [address]   Culture, blood (routine x  2)     Status: None (Preliminary result)   Collection Time: 02/04/20  2:32 PM   Specimen: BLOOD  Result Value Ref Range Status   Specimen Description BLOOD LEFT ANTECUBITAL  Final   Special Requests   Final    BOTTLES DRAWN AEROBIC AND ANAEROBIC Blood Culture adequate volume   Culture   Final    NO GROWTH 2 DAYS Performed at [facility], [place] [address] [place], [place] [address]    Report Status PENDING  Incomplete  Culture, blood (routine x 2)     Status: None (Preliminary result)   Collection Time: 02/04/20  2:37 PM   Specimen: BLOOD RIGHT HAND  Result Value Ref Range Status   Specimen Description BLOOD RIGHT HAND  Final   Special Requests   Final    BOTTLES DRAWN AEROBIC AND ANAEROBIC Blood Culture adequate volume   Culture   Final    NO GROWTH 2 DAYS Performed at [facility], [place] [address], [place], [place] [address]    Report Status PENDING  Incomplete  SARS CORONAVIRUS 2 (TAT 6-24 HRS) Nasopharyngeal Nasopharyngeal Swab     Status: None   Collection Time: 02/05/20 11:35 AM   Specimen: Nasopharyngeal Swab  Result Value Ref Range Status   SARS Coronavirus 2 NEGATIVE NEGATIVE Final    Comment: (NOTE) SARS-CoV-2 target nucleic acids are NOT DETECTED. The SARS-CoV-2 RNA is generally detectable in upper and lower respiratory specimens during the acute phase of infection. Negative results do not preclude SARS-CoV-2 infection, do not rule out co-infections with other pathogens, and should not be used as the sole basis for treatment or other patient management decisions. Negative results must be combined with clinical observations, patient history, and epidemiological information. The expected result is Negative. Fact Sheet for Patients: SugarRoll.be Fact Sheet for Healthcare Providers: https://www.woods-mathews.com/ This test is not yet approved or cleared by the [place] and  has been authorized  for detection  and/or diagnosis of SARS-CoV-2 by FDA under an Emergency Use Authorization (EUA). This EUA will remain  in effect (meaning this test can be used) for the duration of the COVID-19 declaration under Section 56 4(b)(1) of the Act, 21 U.S.C. section 360bbb-3(b)(1), unless the authorization is terminated or revoked sooner. Performed at [facility], [place] [address], [place], [place] [address]      Time coordinating discharge: Over 30 minutes  SIGNED:   Yaakov Guthrie, MD  [facility] 02/06/2020, 3:48 PM Pager on amion  If 7PM-7AM, please contact night-coverage [email or web address] Password [email or web address]

## 2020-02-06 NOTE — TOC Progression Note (Signed)
Transition of Care [facility]) - Progression Note    Patient Details  Name: [name] MRN: [id number] Date of Birth: 1940/11/20  [facility]) CM/SW Contact  Bartholomew Crews, RN Phone Number: [phone] 02/06/2020, 3:28 PM  Clinical Narrative:     Spoke with representative at [facility]. Patient has been approved for 5 days beginning today 5/21 with next review date of 5/25. Clinical review may be sent to Doran [facility]. Auth ID has not yet populated, but reference ID is [id number].   MD notified for DC order and summary.   Admissions at SNF notified. They can take patient today if DC summary received by 4pm. Husband has done admission paperwork.   TOC following for transition needs.   Expected Discharge Plan: [place] Barriers to Discharge: Continued Medical Work up  Expected Discharge Plan and Services Expected Discharge Plan: [place] In-house Referral: Clinical Social Work Discharge Planning Services: CM Consult Post Acute Care Choice: [place] arrangements for the past 2 months: Single Family Home                 DME Arranged: N/A DME Agency: NA       HH Arranged: NA HH Agency: NA         Social Determinants of Health (SDOH) Interventions    Readmission Risk Interventions No flowsheet data found.

## 2020-02-06 NOTE — TOC Transition Note (Addendum)
Transition of Care [facility]) - CM/SW Discharge Note   Patient Details  Name: [name] MRN: [id number] Date of Birth: 1941-05-06  Transition [facility]) CM/SW Contact:  Bartholomew Crews, RN Phone Number: [phone] 02/06/2020, 4:54 PM   Clinical Narrative:     Patient to transition to [facility] this evening. DC summary and order received. PTAR arrangements made, and medical transport paperwork on chart.   Patient to go to room 39 at [facility]. Nurse to call report to [phone].   No further TOC needs identified at this time.   Final next level of care: Skilled Nursing Facility Barriers to Discharge: No Barriers Identified   Patient Goals and CMS Choice Patient states their goals for this hospitalization and ongoing recovery are:: rehab before home CMS Medicare.gov Compare Post Acute Care list provided to:: Patient Choice offered to / list presented to : Patient, Spouse  Discharge Placement              Patient chooses bed at: [facility]) Patient to be transferred to facility by: [place] Name of family member notified: Whitny Semrad Patient and family notified of of transfer: 02/06/20  Discharge Plan and Services In-house Referral: Clinical Social Work Discharge Planning Services: [employer] Consult Post Acute Care Choice: [place]          DME Arranged: N/A DME Agency: NA       HH Arranged: NA HH Agency: NA        Social Determinants of Health (SDOH) Interventions     Readmission Risk Interventions No flowsheet data found.

## 2020-02-06 NOTE — TOC Progression Note (Signed)
Transition of Care [facility]) - Progression Note    Patient Details  Name: [name] MRN: [id number] Date of Birth: 1941-01-18  Transition [facility] Care [facility]) CM/SW Contact  Bartholomew Crews, RN Phone Number: [phone] 02/06/2020, 8:38 AM  Clinical Narrative:     Spoke with representative at [facility] to confirm receipt of clinical information. Verified that authorization is currently under review. TOC following for transition needs.    Expected Discharge Plan: [place] Barriers to Discharge: Continued Medical Work up  Expected Discharge Plan and Services Expected Discharge Plan: [place] In-house Referral: Clinical Social Work Discharge Planning Services: CM Consult Post Acute Care Choice: [place] arrangements for the past 2 months: Single Family Home                 DME Arranged: N/A DME Agency: NA       HH Arranged: NA HH Agency: NA         Social Determinants of Health (SDOH) Interventions    Readmission Risk Interventions No flowsheet data found.

## 2020-02-06 NOTE — Progress Notes (Signed)
Report given to [facility]. All questions answered and no further questions made. Waiting for PTAR for transport to facility.

## 2020-02-06 NOTE — Plan of Care (Signed)
  Problem: Health Behavior/Discharge Planning: Goal: Ability to manage health-related needs will improve Outcome: Adequate for Discharge   Problem: Clinical Measurements: Goal: Ability to maintain clinical measurements within normal limits will improve Outcome: Adequate for Discharge Goal: Will remain free from infection Outcome: Adequate for Discharge Goal: Diagnostic test results will improve Outcome: Adequate for Discharge Goal: Respiratory complications will improve Outcome: Adequate for Discharge Goal: Cardiovascular complication will be avoided Outcome: Adequate for Discharge   Problem: Nutrition: Goal: Adequate nutrition will be maintained Outcome: Adequate for Discharge

## 2020-02-07 NOTE — Progress Notes (Signed)
PTAR here to pick patient up.  Patient's belonging bags x 2 and discharge packet placed on stretcher with patient.  Report given to [facility] personnel.  [facility] called earlier in the shift to confirm that they could accept patient at any time during the night.  Earleen Reaper RN

## 2020-02-09 DIAGNOSIS — G20 Parkinson's disease: Secondary | ICD-10-CM | POA: Diagnosis not present

## 2020-02-09 DIAGNOSIS — I4891 Unspecified atrial fibrillation: Secondary | ICD-10-CM | POA: Diagnosis not present

## 2020-02-09 DIAGNOSIS — R531 Weakness: Secondary | ICD-10-CM | POA: Diagnosis not present

## 2020-02-09 DIAGNOSIS — N390 Urinary tract infection, site not specified: Secondary | ICD-10-CM | POA: Diagnosis not present

## 2020-02-09 LAB — CULTURE, BLOOD (ROUTINE X 2)
Culture: NO GROWTH
Culture: NO GROWTH
Special Requests: ADEQUATE
Special Requests: ADEQUATE

## 2020-02-10 ENCOUNTER — Telehealth: Payer: Self-pay | Admitting: *Deleted

## 2020-02-10 NOTE — Telephone Encounter (Signed)
Pt was on [name] admitted 01/30/20 for sepsis from UTI, was under ICU care, briefly required pressors, stabilized and transferred to hospitalist service on 02/02/2020.  She received treatment with 10 days of IV ceftriaxone per ID. Repeat blood culturesno growth. Pt D/C 02/05/21 to SNF. 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

## 2020-02-12 DIAGNOSIS — G20 Parkinson's disease: Secondary | ICD-10-CM | POA: Diagnosis not present

## 2020-02-12 DIAGNOSIS — R531 Weakness: Secondary | ICD-10-CM | POA: Diagnosis not present

## 2020-02-12 DIAGNOSIS — N390 Urinary tract infection, site not specified: Secondary | ICD-10-CM | POA: Diagnosis not present

## 2020-02-12 DIAGNOSIS — I4891 Unspecified atrial fibrillation: Secondary | ICD-10-CM | POA: Diagnosis not present

## 2020-02-19 ENCOUNTER — Telehealth: Payer: Self-pay | Admitting: Internal Medicine

## 2020-02-19 ENCOUNTER — Encounter: Payer: Self-pay | Admitting: Cardiology

## 2020-02-19 ENCOUNTER — Ambulatory Visit: Payer: Medicare Other | Admitting: Cardiology

## 2020-02-19 ENCOUNTER — Other Ambulatory Visit: Payer: Self-pay

## 2020-02-19 ENCOUNTER — Telehealth: Payer: Self-pay | Admitting: Radiology

## 2020-02-19 VITALS — BP 90/46 | HR 83 | Ht 64.0 in | Wt 157.8 lb

## 2020-02-19 DIAGNOSIS — I10 Essential (primary) hypertension: Secondary | ICD-10-CM

## 2020-02-19 DIAGNOSIS — E119 Type 2 diabetes mellitus without complications: Secondary | ICD-10-CM

## 2020-02-19 DIAGNOSIS — I480 Paroxysmal atrial fibrillation: Secondary | ICD-10-CM | POA: Diagnosis not present

## 2020-02-19 DIAGNOSIS — G20 Parkinson's disease: Secondary | ICD-10-CM | POA: Diagnosis not present

## 2020-02-19 NOTE — Telephone Encounter (Signed)
ok 

## 2020-02-19 NOTE — Progress Notes (Signed)
Cardiology Office Note:    Date:  02/19/2020   ID:  [name], DOB 10-Sep-1941, MRN [id number]  PCP:  Binnie Rail, MD  Cardiologist:  Candee Furbish, MD  Electrophysiologist:  None   Referring MD: Binnie Rail, MD     History of Present Illness:    [name] is a 79 y.o. female with diabetes hypertension chronic any disease stage III recent admit and discharge from the hospital on 02/06/2020 with sepsis from urinary tract infection.  ICU.  Skilled nursing facility.  She is here today for the evaluation of paroxysmal atrial fibrillation at the request of Dr. [name].  During the hospitalization had a brief run of atrial fibrillation while on inotropes.  Because of the reversible cause, anticoagulation was not utilized at that time.  Also has a history of Parkinson's as well.  Overall she is happy to be back home from her rehab stay.  Uses a walker currently.  Try to get her strength back.  She is not having any cardiac type symptoms.  She states that she does have friends that have atrial fibrillation and take blood thinners.  She is not aware of any palpitations.  See below for further details.  Past Medical History:  Diagnosis Date  . Acute renal failure superimposed on stage 3 chronic kidney disease ([place]) 01/31/2020  . Breast cancer ([place]) 2012  . 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 She underwent right total mastectomy with sentinel node biopsy and left total (prophylactic) mastectomy on 10/12/2010. She underwent immediate reconstruction with expander and AlloDerm placement. 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 One lymph  . Complicated UTI (urinary tract infection) 01/31/2020  . Diabetes mellitus   . Dizziness 02/24/2019  . Elevated LFTs 08/10/2014  . Episodic lightheadedness 08/27/2019    . Esophageal reflux 05/06/2008  . Essential hypertension 02/16/2009  . Family history of breast cancer   . Family history of ovarian cancer   . Fatigue 02/24/2019  . Fatty liver 08/05/2015  . Genetic testing 09/04/2017   Negative genetic testing on the common hereditary cancer panel.  The Hereditary Gene Panel offered by [facility] includes sequencing and/or deletion duplication testing of the following 47 genes: APC, ATM, AXIN2, BARD1, BMPR1A, BRCA1, BRCA2, BRIP1, CDH1, CDK4, CDKN2A (p14ARF), CDKN2A (p16INK4a), CHEK2, CTNNA1, DICER1, EPCAM (Deletion/duplication testing only), GREM1 (promoter region deletion/duplicat  . GERD (gastroesophageal reflux disease)   . Gilbert syndrome   . GILBERT'S SYNDROME 02/06/2007   Qualifier: Diagnosis of  By: Janelle Floor    . History of breast cancer 08/04/2015   S/p b/l mastectomy  . History of colonic polyps 06/06/2010   Annotation: destroyed, no clear adenomatous proven Qualifier: Diagnosis of  By: Carlean Purl MD, Tonna Boehringer E  2008 polyp 2013 neg    . Hypertension   . Hypertriglyceridemia 09/30/2007   statin intolerant  Father MI @ 39 Sister CVA > 39   . Hypokalemia due to inadequate potassium intake 01/31/2020  . IBS (irritable bowel syndrome)   . Joint pain 09/02/2010  . Lactic acidosis 01/31/2020  . Osteoarthritis of left knee 08/04/2015   Significant arthritis in knees, limits her activity   . Osteopenia 08/31/2013   [facility]  DEXA 03/20/2018: Osteopenia:  LFN -1.3, R--1.0, spine -0.6-statistically significant increase in BMD in all areas  Findings 08/22/13 : lowest T  score -  1.3  @  R femoral neck (hip) ,3.4  %loss @ R femur &   4.1 % in spine   since  2012 Dexa 01/05/16: lowest  - L femur neck T -1.6 Diagnosis: mild Osteopenia Rx: Fosamax remotely; now on Arimidex   . 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 Peripheral neuropathy 08/23/2018  . Plantar fasciitis of right foot    Dr Oneta Rack  . Poor balance 02/20/2018  . S/P bilateral mastectomy 08/04/2015  . Septic  shock ([place]) 01/31/2020  . Severe sepsis with acute organ dysfunction due to gram-negative bacteria ([place]) 01/31/2020  . Transfusion history 1976  . Type 2 diabetes mellitus with stage 3 chronic kidney disease, without long-term current use of insulin ([place]) 08/11/2008   Ophth, Dr Kathrin Penner: no retinopathy  Diabetes maternal grandmother    Past Surgical History:  Procedure Laterality Date  . ABDOMINAL HYSTERECTOMY  1976   with BSO due to infection from [facility] IUD  . APPENDECTOMY  1976   @ [place]     [id number], [phone]  . CATARACT EXTRACTION, BILATERAL Bilateral 2018  . COLONOSCOPY W/ POLYPECTOMY  2006   negative 2011; Dr Carlean Purl  . MASTECTOMY W/ NODES PARTIAL  2012   double mastectomy with nodes taken out on right side   . PLACEMENT OF BREAST IMPLANTS  04/2011   Dr Migdalia Dk, [facility]  . TONSILLECTOMY AND ADENOIDECTOMY      Current Medications: Current Meds  Medication Sig  . aspirin 81 MG chewable tablet Chew 1 tablet (81 mg total) by mouth daily.  . carbidopa-levodopa (SINEMET IR) 25-100 MG tablet TAKE 1 TABLET BY MOUTH 3 TIMES A DAY.  . carvedilol (COREG) 25 MG tablet Take 1 tablet (25 mg total) by mouth 2 (two) times daily with a meal.  . Cyanocobalamin (VITAMIN B-12) 5000 MCG SUBL Place 5,000 mcg under the tongue daily.   . metFORMIN (GLUCOPHAGE) 500 MG tablet TAKE 2 TABLETS TWICE A DAY WITH MEALS.  . Multiple Vitamin (MULTIVITAMIN WITH MINERALS) TABS tablet Take 1 tablet by mouth daily.  . Omega-3 Fatty Acids (FISH OIL CONCENTRATE) 1000 MG CAPS Take 1,000 mg by mouth 2 (two) times daily.   . [DISCONTINUED] amLODipine (NORVASC) 5 MG tablet Take 1 tablet (5 mg total) by mouth daily.     Allergies:   Sulfur, Exemestane, Morphine and related, Statins, Ace inhibitors, and Codeine   Social History   Socioeconomic History  . Marital status: Married    Spouse name: Not on file  . Number of children: 4  . Years of education: Not on file  .  Highest education level: Not on file  Occupational History  . Occupation: retired  Tobacco Use  . Smoking status: Former Smoker    Quit date: 09/18/1977    Years since quitting: 42.4  . Smokeless tobacco: Never Used  . Tobacco comment: smoked 1957-1979 , up to 1 ppd  Substance and Sexual Activity  . Alcohol use: No    Comment:  very rarely  . Drug use: No  . Sexual activity: Not Currently    Birth control/protection: Surgical  Other Topics Concern  . Not on file  Social History Narrative   Walking for exercise   Social Determinants of Health   Financial Resource Strain:   . Difficulty of Paying Living Expenses:   Food Insecurity:   . Worried About Charity fundraiser in the Last Year:   . [place] in the  Last Year:   Transportation Needs:   . Film/video editor (Medical):   Marland Kitchen Lack of Transportation (Non-Medical):   Physical Activity:   . Days of Exercise per Week:   . Minutes of Exercise per Session:   Stress:   . Feeling of Stress :   Social Connections:   . Frequency of Communication with Friends and Family:   . Frequency of Social Gatherings with Friends and Family:   . Attends Religious Services:   . Active Member of Clubs or Organizations:   . Attends Archivist Meetings:   Marland Kitchen Marital Status:      Family History: The patient's family history includes Breast cancer (age of onset: 25) in her maternal aunt; Breast cancer (age of onset: 19) in her sister; Breast cancer (age of onset: 79) in her maternal aunt; Diabetes in her maternal grandmother; Heart attack (age of onset: 42) in her father; Heart failure in her sister; Hypertension in her sister; Ovarian cancer in her paternal grandmother; Stroke in her mother; Stroke (age of onset: 2) in her sister.  ROS:   Please see the history of present illness.    No fevers chills nausea vomiting syncope bleeding all other systems reviewed and are negative.  EKGs/Labs/Other Studies Reviewed:    The  following studies were reviewed today:  ECHO 01/31/20:  1. Left ventricular ejection fraction, by estimation, is 60 to 65%. The  left ventricle has normal function. The left ventricle has no regional  wall motion abnormalities. Left ventricular diastolic parameters were  normal.  2. Right ventricular systolic function is normal. The right ventricular  size is normal.  3. Left atrial size was mildly dilated.  4. The mitral valve is degenerative. Trivial mitral valve regurgitation.  No evidence of mitral stenosis.  5. The aortic valve is tricuspid. Aortic valve regurgitation is not  visualized. Mild to moderate aortic valve sclerosis/calcification is  present, without any evidence of aortic stenosis.  6. The inferior vena cava is normal in size with greater than 50%  respiratory variability, suggesting right atrial pressure of 3 mmHg.   EKG:  EKG is not ordered today.  02/02/2020 shows sinus rhythm left and branch block.  98 bpm.  Recent Labs: 02/02/2020: TSH 2.102 02/06/2020: ALT 30; B Natriuretic Peptide 302.6; BUN 14; Creatinine, Ser 1.04; Hemoglobin 12.0; Magnesium 1.5; Platelets 263; Potassium 3.1; Sodium 138  Recent Lipid Panel    Component Value Date/Time   CHOL 160 08/27/2019 0910   CHOL 187 08/06/2014 1230   TRIG 184.0 (H) 08/27/2019 0910   TRIG 214 (H) 08/06/2014 1230   TRIG 120 07/03/2006 0818   HDL 38.70 (L) 08/27/2019 0910   HDL 37 (L) 08/06/2014 1230   CHOLHDL 4 08/27/2019 0910   VLDL 36.8 08/27/2019 0910   LDLCALC 85 08/27/2019 0910   LDLCALC 107 (H) 08/06/2014 1230   LDLDIRECT 108.0 08/15/2017 0847    Physical Exam:    VS:  BP (!) 90/46   Pulse 83   Ht '5\' 4"'$  (1.626 m)   Wt 157 lb 12.8 oz (71.6 kg)   SpO2 96%   BMI 27.09 kg/m     Wt Readings from Last 3 Encounters:  02/19/20 157 lb 12.8 oz (71.6 kg)  02/03/20 163 lb 2.3 oz (74 kg)  08/27/19 163 lb 12.8 oz (74.3 kg)     GEN: Uses a walker.  Well nourished, well developed in no acute  distress HEENT: Normal NECK: No JVD; No carotid bruits LYMPHATICS: No lymphadenopathy  CARDIAC: RRR, no murmurs, rubs, gallops RESPIRATORY:  Clear to auscultation without rales, wheezing or rhonchi  ABDOMEN: Soft, non-tender, non-distended MUSCULOSKELETAL:  No edema; No deformity  SKIN: Warm and dry NEUROLOGIC:  Alert and oriented x 3 PSYCHIATRIC:  Normal affect   ASSESSMENT:    1. PAF (paroxysmal atrial fibrillation) (HCC)   2. Parkinson disease ([place])   3. Diabetes mellitus with coincident hypertension (HCC)    PLAN:    In order of problems listed above:  Paroxysmal atrial fibrillation in the hospital setting during urosepsis/inotropes -Certainly this could have been a reversible cause.  I think it would make sense to place a Zio patch monitor to see if any further evidence of atrial fibrillation takes place.  If it does, anticoagulation would be warranted given her chads vasc score of at least 4.  Parkinson's disease -Carbidopa levodopa per primary team  Diabetes with essential hypertension -Carvedilol amlodipine.  Currently her blood pressure is 90/46.  Similar at home.  She is asymptomatic with this however.  No syncope, no dizziness.  We will go ahead and discontinue her amlodipine 5 mg.  Continue with the carvedilol 25 mg twice a day.  If necessary, this can be cut back as well in the future.  Previously on angiotensin receptor blocker prior to sepsis.  Dr. [name] is following as well.  We will follow-up with results of monitor.  Medication Adjustments/Labs and Tests Ordered: Current medicines are reviewed at length with the patient today.  Concerns regarding medicines are outlined above.  Orders Placed This Encounter  Procedures  . LONG TERM MONITOR (3-14 DAYS)   No orders of the defined types were placed in this encounter.   Patient Instructions  Medication Instructions:  Please discontinue your Amlodipine.  Continue all other medications as listed.  *If you need a  refill on your cardiac medications before your next appointment, please call your pharmacy*  Testing/Procedures: [place] Monitor Instructions   Your physician has requested you wear your ZIO patch monitor 14 days.   This is a single patch monitor.  [employer] supplies one patch monitor per enrollment.  Additional stickers are not available.   Please do not apply patch if you will be having a Nuclear Stress Test, Echocardiogram, Cardiac CT, MRI, or Chest Xray during the time frame you would be wearing the monitor. The patch cannot be worn during these tests.  You cannot remove and re-apply the ZIO XT patch monitor.   Your ZIO patch monitor will be sent USPS Priority mail from [facility] directly to your home address. The monitor may also be mailed to a PO BOX if home delivery is not available.   It may take 3-5 days to receive your monitor after you have been enrolled.   Once you have received you monitor, please review enclosed instructions.  Your monitor has already been registered assigning a specific monitor serial # to you.   Applying the monitor   Shave hair from upper left chest.   Hold abrader disc by orange tab.  Rub abrader in 40 strokes over left upper chest as indicated in your monitor instructions.   Clean area with 4 enclosed alcohol pads .  Use all pads to assure are is cleaned thoroughly.  Let dry.   Apply patch as indicated in monitor instructions.  Patch will be place under collarbone on left side of chest with arrow pointing upward.   Rub patch adhesive wings for 2 minutes.Remove white label marked "1".  Remove white label marked "2".  Rub patch adhesive wings for 2 additional minutes.   While looking in a mirror, press and release button in center of patch.  A small green light will flash 3-4 times .  This will be your only indicator the monitor has been turned on.     Do not shower for the first 24 hours.  You may shower after the first 24 hours.    Press button if you feel a symptom. You will hear a small click.  Record Date, Time and Symptom in the Patient Log Book.   When you are ready to remove patch, follow instructions on last 2 pages of Patient Log Book.  Stick patch monitor onto last page of Patient Log Book.   Place Patient Log Book in [place] box.  Use locking tab on box and tape box closed securely.  The [employer] and [employer] has [employer] on it.  Please place in mailbox as soon as possible.  Your physician should have your test results approximately 7 days after the monitor has been mailed back to [facility].   Call [place] at [phone] if you have questions regarding your ZIO XT patch monitor.  Call them immediately if you see an orange light blinking on your monitor.   If your monitor falls off in less than 4 days contact our Monitor department at [phone].  If your monitor becomes loose or falls off after 4 days call [employer] at [phone] for suggestions on securing your monitor.   Follow-Up: At [facility], you and your health needs are our priority.  As part of our continuing mission to provide you with exceptional heart care, we have created designated Provider Care Teams.  These Care Teams include your primary Cardiologist (physician) and Advanced Practice Providers (APPs -  Physician Assistants and Nurse Practitioners) who all work together to provide you with the care you need, when you need it.  We recommend signing up for the patient portal called "MyChart".  Sign up information is provided on this After Visit Summary.  MyChart is used to connect with patients for Virtual Visits (Telemedicine).  Patients are able to view lab/test results, encounter notes, upcoming appointments, etc.  Non-urgent messages can be sent to your provider as well.   To learn more about what you can do with MyChart, go to [email or web address].    Your next appointment:   6 month(s)  The format for  your next appointment:   In Person  Provider:   Cecilie Kicks, NP   Thank you for choosing [facility]!!        Signed, Candee Furbish, MD  02/19/2020 9:55 AM    [place]

## 2020-02-19 NOTE — Telephone Encounter (Signed)
New message:    [name] is calling from [facility] and states that there has been a delay in the pt's visit due to the pt stating she doesn't want them to come until Friday. She states she needs a verbal okay from [place]. Please advise.

## 2020-02-19 NOTE — Patient Instructions (Signed)
Medication Instructions:  Please discontinue your Amlodipine.  Continue all other medications as listed.  *If you need a refill on your cardiac medications before your next appointment, please call your pharmacy*  Testing/Procedures: [place] Monitor Instructions   Your physician has requested you wear your ZIO patch monitor 14 days.   This is a single patch monitor.  Ir[employer] supplies one patch monitor per enrollment.  Additional stickers are not available.   Please do not apply patch if you will be having a Nuclear Stress Test, Echocardiogram, Cardiac CT, MRI, or Chest Xray during the time frame you would be wearing the monitor. The patch cannot be worn during these tests.  You cannot remove and re-apply the ZIO XT patch monitor.   Your ZIO patch monitor will be sent USPS Priority mail from [facility] directly to your home address. The monitor may also be mailed to a PO BOX if home delivery is not available.   It may take 3-5 days to receive your monitor after you have been enrolled.   Once you have received you monitor, please review enclosed instructions.  Your monitor has already been registered assigning a specific monitor serial # to you.   Applying the monitor   Shave hair from upper left chest.   Hold abrader disc by orange tab.  Rub abrader in 40 strokes over left upper chest as indicated in your monitor instructions.   Clean area with 4 enclosed alcohol pads .  Use all pads to assure are is cleaned thoroughly.  Let dry.   Apply patch as indicated in monitor instructions.  Patch will be place under collarbone on left side of chest with arrow pointing upward.   Rub patch adhesive wings for 2 minutes.Remove white label marked "1".  Remove white label marked "2".  Rub patch adhesive wings for 2 additional minutes.   While looking in a mirror, press and release button in center of patch.  A small green light will flash 3-4 times .  This will be your only  indicator the monitor has been turned on.     Do not shower for the first 24 hours.  You may shower after the first 24 hours.   Press button if you feel a symptom. You will hear a small click.  Record Date, Time and Symptom in the Patient Log Book.   When you are ready to remove patch, follow instructions on last 2 pages of Patient Log Book.  Stick patch monitor onto last page of Patient Log Book.   Place Patient Log Book in [place] box.  Use locking tab on box and tape box closed securely.  The [employer] has [employer] on it.  Please place in mailbox as soon as possible.  Your physician should have your test results approximately 7 days after the monitor has been mailed back to [facility].   Call [place] at [phone] if you have questions regarding your ZIO XT patch monitor.  Call them immediately if you see an orange light blinking on your monitor.   If your monitor falls off in less than 4 days contact our Monitor department at [phone].  If your monitor becomes loose or falls off after 4 days call [employer] at [phone] for suggestions on securing your monitor.   Follow-Up: At [facility], you and your health needs are our priority.  As part of our continuing mission to provide you with exceptional heart care, we have created  designated Provider Care Teams.  These Care Teams include your primary Cardiologist (physician) and Advanced Practice Providers (APPs -  Physician Assistants and Nurse Practitioners) who all work together to provide you with the care you need, when you need it.  We recommend signing up for the patient portal called "MyChart".  Sign up information is provided on this After Visit Summary.  MyChart is used to connect with patients for Virtual Visits (Telemedicine).  Patients are able to view lab/test results, encounter notes, upcoming appointments, etc.  Non-urgent messages can be sent to your provider as well.   To learn  more about what you can do with MyChart, go to [email or web address].    Your next appointment:   6 month(s)  The format for your next appointment:   In Person  Provider:   Cecilie Kicks, NP   Thank you for choosing [facility]!!

## 2020-02-19 NOTE — Telephone Encounter (Signed)
Enrolled patient for a 14 day Zio monitor to be mailed to patients home.  

## 2020-02-20 DIAGNOSIS — N179 Acute kidney failure, unspecified: Secondary | ICD-10-CM | POA: Diagnosis not present

## 2020-02-20 DIAGNOSIS — N183 Chronic kidney disease, stage 3 unspecified: Secondary | ICD-10-CM | POA: Diagnosis not present

## 2020-02-20 DIAGNOSIS — A4150 Gram-negative sepsis, unspecified: Secondary | ICD-10-CM | POA: Diagnosis not present

## 2020-02-20 DIAGNOSIS — E1122 Type 2 diabetes mellitus with diabetic chronic kidney disease: Secondary | ICD-10-CM | POA: Diagnosis not present

## 2020-02-20 DIAGNOSIS — B9620 Unspecified Escherichia coli [E. coli] as the cause of diseases classified elsewhere: Secondary | ICD-10-CM | POA: Diagnosis not present

## 2020-02-20 DIAGNOSIS — E538 Deficiency of other specified B group vitamins: Secondary | ICD-10-CM | POA: Diagnosis not present

## 2020-02-20 DIAGNOSIS — I129 Hypertensive chronic kidney disease with stage 1 through stage 4 chronic kidney disease, or unspecified chronic kidney disease: Secondary | ICD-10-CM | POA: Diagnosis not present

## 2020-02-20 DIAGNOSIS — G20 Parkinson's disease: Secondary | ICD-10-CM | POA: Diagnosis not present

## 2020-02-20 DIAGNOSIS — N390 Urinary tract infection, site not specified: Secondary | ICD-10-CM | POA: Diagnosis not present

## 2020-02-20 DIAGNOSIS — E876 Hypokalemia: Secondary | ICD-10-CM | POA: Diagnosis not present

## 2020-02-20 DIAGNOSIS — E1140 Type 2 diabetes mellitus with diabetic neuropathy, unspecified: Secondary | ICD-10-CM | POA: Diagnosis not present

## 2020-02-20 DIAGNOSIS — R6521 Severe sepsis with septic shock: Secondary | ICD-10-CM | POA: Diagnosis not present

## 2020-02-20 NOTE — Telephone Encounter (Signed)
Left detailed message with verbal order.

## 2020-02-20 NOTE — Telephone Encounter (Signed)
LVM giving ok for orders.  

## 2020-02-20 NOTE — Telephone Encounter (Addendum)
   Kelly from [facility] did see patient today. Requesting orders for continued PT 1x week for 1 week 2x week for 3 weeks 1x week for 1 week   Phone [phone]

## 2020-02-24 ENCOUNTER — Other Ambulatory Visit (INDEPENDENT_AMBULATORY_CARE_PROVIDER_SITE_OTHER): Payer: Medicare Other

## 2020-02-24 ENCOUNTER — Other Ambulatory Visit: Payer: Self-pay | Admitting: Internal Medicine

## 2020-02-24 DIAGNOSIS — I480 Paroxysmal atrial fibrillation: Secondary | ICD-10-CM

## 2020-02-24 NOTE — Progress Notes (Signed)
Subjective:    Patient ID: [name], female    DOB: [birth year], 79 y.o.   MRN: [id number]  HPI The patient is here for follow up of their chronic medical problems, including DM, htn, hypertriglyceridemia, CKD, parkinson's dz  Hospitalized 5/14-5/21 for sepsis due to ecoli.  She had a run of Afib, Ct showed possible cirrhosis - will see GI, BP meds held due to AKI on CKD.  She went to rehab and is now at home.    She is taking all of her medications as prescribed.   She is doing PT exercises at home.        Medications and allergies reviewed with patient and updated if appropriate.  Patient Active Problem List   Diagnosis Date Noted  . Paroxysmal atrial fibrillation ([place]) 02/25/2020  . Sepsis due to gram-negative bacteria ([place]) 01/31/2020  . Lactic acidosis 01/31/2020  . Complicated UTI (urinary tract infection) 01/31/2020  . 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 CKD (chronic kidney disease) stage 3, GFR 30-59 ml/min 01/31/2020  . Severe sepsis with acute organ dysfunction due to gram-negative bacteria ([place]) 01/31/2020  . Hypokalemia due to inadequate potassium intake 01/31/2020  . Severe sepsis with acute organ dysfunction due to Gram negative bacteria ([place]) 01/31/2020  . Septic shock ([place]) 01/31/2020  . Episodic lightheadedness 08/27/2019  . Fatigue 02/24/2019  . Dizziness 02/24/2019  . Peripheral neuropathy 08/23/2018  . Poor balance 02/20/2018  . Genetic testing 09/04/2017  . Family history of breast cancer   . Family history of ovarian cancer   . Fatty liver 08/05/2015  . History of breast cancer 08/04/2015  . S/P bilateral mastectomy 08/04/2015  . Osteoarthritis of left knee 08/04/2015  . Elevated LFTs 08/10/2014  . Osteopenia 08/31/2013  . Transfusion history 08/18/2013  . IBS (irritable bowel syndrome) 08/18/2013  . 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 Joint pain 09/02/2010  . History of colonic polyps 06/06/2010  .  Essential hypertension 02/16/2009  . Type 2 diabetes mellitus with stage 3 chronic kidney disease, without long-term current use of insulin ([place]) 08/11/2008  . Hypertriglyceridemia 09/30/2007  . GILBERT'S SYNDROME 02/06/2007    Current Outpatient Medications on File Prior to Visit  Medication Sig Dispense Refill  . aspirin 81 MG chewable tablet Chew 1 tablet (81 mg total) by mouth daily. 30 tablet 0  . carbidopa-levodopa (SINEMET IR) 25-100 MG tablet TAKE 1 TABLET BY MOUTH 3 TIMES A DAY. 90 tablet 5  . carvedilol (COREG) 25 MG tablet Take 1 tablet (25 mg total) by mouth 2 (two) times daily with a meal. 60 tablet 0  . Cyanocobalamin (VITAMIN B-12) 5000 MCG SUBL Place 5,000 mcg under the tongue daily.     . metFORMIN (GLUCOPHAGE) 500 MG tablet TAKE 2 TABLETS TWICE A DAY WITH MEALS. 360 tablet 2  . Multiple Vitamin (MULTIVITAMIN WITH MINERALS) TABS tablet Take 1 tablet by mouth daily.    . Omega-3 Fatty Acids (FISH OIL CONCENTRATE) 1000 MG CAPS Take 1,000 mg by mouth 2 (two) times daily.      No current facility-administered medications on file prior to visit.    Past Medical History:  Diagnosis Date  . Acute renal failure superimposed on stage 3 chronic kidney disease ([place]) 01/31/2020  . Breast cancer ([place]) 2012  . 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 She underwent right total  mastectomy with sentinel node biopsy and left total (prophylactic) mastectomy on 10/12/2010. She underwent immediate reconstruction with expander and AlloDerm placement. 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 One lymph  . Complicated UTI (urinary tract infection) 01/31/2020  . Diabetes mellitus   . Dizziness 02/24/2019  . Elevated LFTs 08/10/2014  . Episodic lightheadedness 08/27/2019  . Esophageal reflux 05/06/2008  . Essential  hypertension 02/16/2009  . Family history of breast cancer   . Family history of ovarian cancer   . Fatigue 02/24/2019  . Fatty liver 08/05/2015  . Genetic testing 09/04/2017   Negative genetic testing on the common hereditary cancer panel.  The Hereditary Gene Panel offered by [facility] includes sequencing and/or deletion duplication testing of the following 47 genes: APC, ATM, AXIN2, BARD1, BMPR1A, BRCA1, BRCA2, BRIP1, CDH1, CDK4, CDKN2A (p14ARF), CDKN2A (p16INK4a), CHEK2, CTNNA1, DICER1, EPCAM (Deletion/duplication testing only), GREM1 (promoter region deletion/duplicat  . GERD (gastroesophageal reflux disease)   . Gilbert syndrome   . GILBERT'S SYNDROME 02/06/2007   Qualifier: Diagnosis of  By: Janelle Floor    . History of breast cancer 08/04/2015   S/p b/l mastectomy  . History of colonic polyps 06/06/2010   Annotation: destroyed, no clear adenomatous proven Qualifier: Diagnosis of  By: Carlean Purl MD, Tonna Boehringer E  2008 polyp 2013 neg    . Hypertension   . Hypertriglyceridemia 09/30/2007   statin intolerant  Father MI @ 33 Sister CVA > 67   . Hypokalemia due to inadequate potassium intake 01/31/2020  . IBS (irritable bowel syndrome)   . Joint pain 09/02/2010  . Lactic acidosis 01/31/2020  . Osteoarthritis of left knee 08/04/2015   Significant arthritis in knees, limits her activity   . Osteopenia 08/31/2013   [facility]  DEXA 03/20/2018: Osteopenia:  LFN -1.3, R--1.0, spine -0.6-statistically significant increase in BMD in all areas  Findings 08/22/13 : lowest T score -  1.3  @  R femoral neck (hip) ,3.4  %loss @ R femur &   4.1 % in spine   since  2012 Dexa 01/05/16: lowest  - L femur neck T -1.6 Diagnosis: mild Osteopenia Rx: Fosamax remotely; now on Arimidex   . 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 Peripheral neuropathy 08/23/2018  . Plantar fasciitis of right foot    Dr Oneta Rack  . Poor balance 02/20/2018  . S/P bilateral mastectomy 08/04/2015  . Septic shock ([place]) 01/31/2020  . Severe sepsis with  acute organ dysfunction due to gram-negative bacteria ([place]) 01/31/2020  . Transfusion history 1976  . Type 2 diabetes mellitus with stage 3 chronic kidney disease, without long-term current use of insulin ([place]) 08/11/2008   Ophth, Dr Kathrin Penner: no retinopathy  Diabetes maternal grandmother    Past Surgical History:  Procedure Laterality Date  . ABDOMINAL HYSTERECTOMY  1976   with BSO due to infection from [facility] IUD  . APPENDECTOMY  1976   @ [place]     [id number], [phone]  . CATARACT EXTRACTION, BILATERAL Bilateral 2018  . COLONOSCOPY W/ POLYPECTOMY  2006   negative 2011; Dr Carlean Purl  . MASTECTOMY W/ NODES PARTIAL  2012   double mastectomy with nodes taken out on right side   . PLACEMENT OF BREAST IMPLANTS  04/2011   Dr Migdalia Dk, [facility]  . TONSILLECTOMY AND ADENOIDECTOMY      Social History   Socioeconomic History  . Marital status: Married    Spouse name: Not on file  .  Number of children: 4  . Years of education: Not on file  . Highest education level: Not on file  Occupational History  . Occupation: retired  Tobacco Use  . Smoking status: Former Smoker    Quit date: 09/18/1977    Years since quitting: 42.4  . Smokeless tobacco: Never Used  . Tobacco comment: smoked 1957-1979 , up to 1 ppd  Substance and Sexual Activity  . Alcohol use: No    Comment:  very rarely  . Drug use: No  . Sexual activity: Not Currently    Birth control/protection: Surgical  Other Topics Concern  . Not on file  Social History Narrative   Walking for exercise   Social Determinants of Health   Financial Resource Strain:   . Difficulty of Paying Living Expenses:   Food Insecurity:   . Worried About Charity fundraiser in the Last Year:   . Arboriculturist in the Last Year:   Transportation Needs:   . Film/video editor (Medical):   Marland Kitchen Lack of Transportation (Non-Medical):   Physical Activity:   . Days of Exercise per Week:   . Minutes of  Exercise per Session:   Stress:   . Feeling of Stress :   Social Connections:   . Frequency of Communication with Friends and Family:   . Frequency of Social Gatherings with Friends and Family:   . Attends Religious Services:   . Active Member of Clubs or Organizations:   . Attends Archivist Meetings:   Marland Kitchen Marital Status:     Family History  Problem Relation Age of Onset  . Heart attack Father 51  . Breast cancer Sister 23        bilateral   . Heart failure Sister        PMH intensive chemotherapy  . Hypertension Sister   . Stroke Sister 65  . Stroke Mother        TMI  . Breast cancer Maternal Aunt 38  . Diabetes Maternal Grandmother   . Ovarian cancer Paternal Grandmother   . Breast cancer Maternal Aunt 77    Review of Systems  Constitutional: Positive for fatigue (? related to coreg). Negative for chills and fever.  Respiratory: Negative for cough, shortness of breath and wheezing.   Cardiovascular: Negative for chest pain, palpitations and leg swelling.  Gastrointestinal: Negative for abdominal pain and nausea.  Genitourinary: Negative for dysuria, frequency, hematuria and urgency.  Neurological: Negative for dizziness, light-headedness and headaches.  Psychiatric/Behavioral: Negative for sleep disturbance.       Objective:   Vitals:   02/25/20 0858  BP: 124/70  Pulse: 93  Temp: 98.4 F (36.9 C)  SpO2: 97%   BP Readings from Last 3 Encounters:  02/25/20 124/70  02/19/20 (!) 90/46  02/06/20 (!) 123/58   Wt Readings from Last 3 Encounters:  02/25/20 159 lb (72.1 kg)  02/19/20 157 lb 12.8 oz (71.6 kg)  02/03/20 163 lb 2.3 oz (74 kg)   Body mass index is 27.29 kg/m.   Physical Exam    Constitutional: Appears well-developed and well-nourished. No distress.  HENT:  Head: Normocephalic and atraumatic.  Neck: Neck supple. No tracheal deviation present. No thyromegaly present.  No cervical lymphadenopathy Cardiovascular: Normal rate, regular  rhythm and normal heart sounds.   No murmur heard. No carotid bruit .  No edema Pulmonary/Chest: Effort normal and breath sounds normal. No respiratory distress. No has no wheezes. No rales.  Abdomen: soft, NT,  ND Skin: Skin is warm and dry. Not diaphoretic.  Psychiatric: Normal mood and affect. Behavior is normal.      Assessment & Plan:    See Problem List for Assessment and Plan of chronic medical problems.    This visit occurred during the SARS-CoV-2 public health emergency.  Safety protocols were in place, including screening questions prior to the visit, additional usage of staff PPE, and extensive cleaning of exam room while observing appropriate contact time as indicated for disinfecting solutions.

## 2020-02-24 NOTE — Patient Instructions (Addendum)
  Medications reviewed and updated.  Changes include :   none    Please followup in 6 months   

## 2020-02-25 ENCOUNTER — Ambulatory Visit (INDEPENDENT_AMBULATORY_CARE_PROVIDER_SITE_OTHER): Payer: Medicare Other | Admitting: Internal Medicine

## 2020-02-25 ENCOUNTER — Other Ambulatory Visit: Payer: Self-pay

## 2020-02-25 ENCOUNTER — Encounter: Payer: Self-pay | Admitting: Internal Medicine

## 2020-02-25 VITALS — BP 124/70 | HR 93 | Temp 98.4°F | Ht 64.0 in | Wt 159.0 lb

## 2020-02-25 DIAGNOSIS — E538 Deficiency of other specified B group vitamins: Secondary | ICD-10-CM | POA: Diagnosis not present

## 2020-02-25 DIAGNOSIS — I10 Essential (primary) hypertension: Secondary | ICD-10-CM

## 2020-02-25 DIAGNOSIS — B9620 Unspecified Escherichia coli [E. coli] as the cause of diseases classified elsewhere: Secondary | ICD-10-CM | POA: Diagnosis not present

## 2020-02-25 DIAGNOSIS — E1122 Type 2 diabetes mellitus with diabetic chronic kidney disease: Secondary | ICD-10-CM

## 2020-02-25 DIAGNOSIS — N390 Urinary tract infection, site not specified: Secondary | ICD-10-CM | POA: Diagnosis not present

## 2020-02-25 DIAGNOSIS — E876 Hypokalemia: Secondary | ICD-10-CM | POA: Diagnosis not present

## 2020-02-25 DIAGNOSIS — E1140 Type 2 diabetes mellitus with diabetic neuropathy, unspecified: Secondary | ICD-10-CM | POA: Diagnosis not present

## 2020-02-25 DIAGNOSIS — R932 Abnormal findings on diagnostic imaging of liver and biliary tract: Secondary | ICD-10-CM

## 2020-02-25 DIAGNOSIS — R259 Unspecified abnormal involuntary movements: Secondary | ICD-10-CM

## 2020-02-25 DIAGNOSIS — G20 Parkinson's disease: Secondary | ICD-10-CM | POA: Diagnosis not present

## 2020-02-25 DIAGNOSIS — E1121 Type 2 diabetes mellitus with diabetic nephropathy: Secondary | ICD-10-CM | POA: Diagnosis not present

## 2020-02-25 DIAGNOSIS — E781 Pure hyperglyceridemia: Secondary | ICD-10-CM

## 2020-02-25 DIAGNOSIS — I129 Hypertensive chronic kidney disease with stage 1 through stage 4 chronic kidney disease, or unspecified chronic kidney disease: Secondary | ICD-10-CM | POA: Diagnosis not present

## 2020-02-25 DIAGNOSIS — A4150 Gram-negative sepsis, unspecified: Secondary | ICD-10-CM | POA: Diagnosis not present

## 2020-02-25 DIAGNOSIS — N1831 Chronic kidney disease, stage 3a: Secondary | ICD-10-CM

## 2020-02-25 DIAGNOSIS — N179 Acute kidney failure, unspecified: Secondary | ICD-10-CM | POA: Diagnosis not present

## 2020-02-25 DIAGNOSIS — N183 Chronic kidney disease, stage 3 unspecified: Secondary | ICD-10-CM | POA: Diagnosis not present

## 2020-02-25 DIAGNOSIS — R6521 Severe sepsis with septic shock: Secondary | ICD-10-CM | POA: Diagnosis not present

## 2020-02-25 DIAGNOSIS — I480 Paroxysmal atrial fibrillation: Secondary | ICD-10-CM

## 2020-02-25 NOTE — Assessment & Plan Note (Addendum)
Management per Dr Tomi Likens On sinemet

## 2020-02-25 NOTE — Assessment & Plan Note (Signed)
Chronic a1c 6.7% last month at rehab, 6.5% 5/15 Well controlled Continue metformin at current dose

## 2020-02-25 NOTE — Assessment & Plan Note (Signed)
New 01/2020 while hospitalized for sepsis due to UTI Wearing monitor On coreg, ASA 81 mg Asymptomatic Following with cardiology

## 2020-02-25 NOTE — Assessment & Plan Note (Signed)
Chronic - stable Has had recent labs, which I reviewed No need for labs today

## 2020-02-25 NOTE — Assessment & Plan Note (Signed)
Chronic BP well controlled Current regimen effective and well tolerated Continue current medications at current doses  

## 2020-02-26 ENCOUNTER — Telehealth: Payer: Self-pay | Admitting: Internal Medicine

## 2020-02-26 NOTE — Telephone Encounter (Signed)
New message:    [name] is calling from [facility] requesting verbal orders for Home OT for 1x a week for 8 weeks. Please advise.

## 2020-02-26 NOTE — Telephone Encounter (Signed)
ok 

## 2020-02-27 DIAGNOSIS — N390 Urinary tract infection, site not specified: Secondary | ICD-10-CM | POA: Diagnosis not present

## 2020-02-27 DIAGNOSIS — R6521 Severe sepsis with septic shock: Secondary | ICD-10-CM | POA: Diagnosis not present

## 2020-02-27 DIAGNOSIS — E538 Deficiency of other specified B group vitamins: Secondary | ICD-10-CM | POA: Diagnosis not present

## 2020-02-27 DIAGNOSIS — E1140 Type 2 diabetes mellitus with diabetic neuropathy, unspecified: Secondary | ICD-10-CM | POA: Diagnosis not present

## 2020-02-27 DIAGNOSIS — N179 Acute kidney failure, unspecified: Secondary | ICD-10-CM | POA: Diagnosis not present

## 2020-02-27 DIAGNOSIS — E876 Hypokalemia: Secondary | ICD-10-CM | POA: Diagnosis not present

## 2020-02-27 DIAGNOSIS — G20 Parkinson's disease: Secondary | ICD-10-CM | POA: Diagnosis not present

## 2020-02-27 DIAGNOSIS — E1122 Type 2 diabetes mellitus with diabetic chronic kidney disease: Secondary | ICD-10-CM | POA: Diagnosis not present

## 2020-02-27 DIAGNOSIS — B9620 Unspecified Escherichia coli [E. coli] as the cause of diseases classified elsewhere: Secondary | ICD-10-CM | POA: Diagnosis not present

## 2020-02-27 DIAGNOSIS — A4150 Gram-negative sepsis, unspecified: Secondary | ICD-10-CM | POA: Diagnosis not present

## 2020-02-27 DIAGNOSIS — N183 Chronic kidney disease, stage 3 unspecified: Secondary | ICD-10-CM | POA: Diagnosis not present

## 2020-02-27 DIAGNOSIS — I129 Hypertensive chronic kidney disease with stage 1 through stage 4 chronic kidney disease, or unspecified chronic kidney disease: Secondary | ICD-10-CM | POA: Diagnosis not present

## 2020-02-27 NOTE — Telephone Encounter (Signed)
Left message for [name] today with verbal ok.

## 2020-03-03 DIAGNOSIS — A4150 Gram-negative sepsis, unspecified: Secondary | ICD-10-CM | POA: Diagnosis not present

## 2020-03-03 DIAGNOSIS — N183 Chronic kidney disease, stage 3 unspecified: Secondary | ICD-10-CM | POA: Diagnosis not present

## 2020-03-03 DIAGNOSIS — N179 Acute kidney failure, unspecified: Secondary | ICD-10-CM | POA: Diagnosis not present

## 2020-03-03 DIAGNOSIS — E1140 Type 2 diabetes mellitus with diabetic neuropathy, unspecified: Secondary | ICD-10-CM | POA: Diagnosis not present

## 2020-03-03 DIAGNOSIS — E1122 Type 2 diabetes mellitus with diabetic chronic kidney disease: Secondary | ICD-10-CM | POA: Diagnosis not present

## 2020-03-03 DIAGNOSIS — G20 Parkinson's disease: Secondary | ICD-10-CM | POA: Diagnosis not present

## 2020-03-03 DIAGNOSIS — B9620 Unspecified Escherichia coli [E. coli] as the cause of diseases classified elsewhere: Secondary | ICD-10-CM | POA: Diagnosis not present

## 2020-03-03 DIAGNOSIS — E876 Hypokalemia: Secondary | ICD-10-CM | POA: Diagnosis not present

## 2020-03-03 DIAGNOSIS — R6521 Severe sepsis with septic shock: Secondary | ICD-10-CM | POA: Diagnosis not present

## 2020-03-03 DIAGNOSIS — E538 Deficiency of other specified B group vitamins: Secondary | ICD-10-CM | POA: Diagnosis not present

## 2020-03-03 DIAGNOSIS — I129 Hypertensive chronic kidney disease with stage 1 through stage 4 chronic kidney disease, or unspecified chronic kidney disease: Secondary | ICD-10-CM | POA: Diagnosis not present

## 2020-03-03 DIAGNOSIS — N390 Urinary tract infection, site not specified: Secondary | ICD-10-CM | POA: Diagnosis not present

## 2020-03-05 DIAGNOSIS — E876 Hypokalemia: Secondary | ICD-10-CM | POA: Diagnosis not present

## 2020-03-05 DIAGNOSIS — A4150 Gram-negative sepsis, unspecified: Secondary | ICD-10-CM | POA: Diagnosis not present

## 2020-03-05 DIAGNOSIS — N179 Acute kidney failure, unspecified: Secondary | ICD-10-CM | POA: Diagnosis not present

## 2020-03-05 DIAGNOSIS — B9620 Unspecified Escherichia coli [E. coli] as the cause of diseases classified elsewhere: Secondary | ICD-10-CM | POA: Diagnosis not present

## 2020-03-05 DIAGNOSIS — N183 Chronic kidney disease, stage 3 unspecified: Secondary | ICD-10-CM | POA: Diagnosis not present

## 2020-03-05 DIAGNOSIS — I129 Hypertensive chronic kidney disease with stage 1 through stage 4 chronic kidney disease, or unspecified chronic kidney disease: Secondary | ICD-10-CM | POA: Diagnosis not present

## 2020-03-05 DIAGNOSIS — E538 Deficiency of other specified B group vitamins: Secondary | ICD-10-CM | POA: Diagnosis not present

## 2020-03-05 DIAGNOSIS — N390 Urinary tract infection, site not specified: Secondary | ICD-10-CM | POA: Diagnosis not present

## 2020-03-05 DIAGNOSIS — R6521 Severe sepsis with septic shock: Secondary | ICD-10-CM | POA: Diagnosis not present

## 2020-03-05 DIAGNOSIS — E1140 Type 2 diabetes mellitus with diabetic neuropathy, unspecified: Secondary | ICD-10-CM | POA: Diagnosis not present

## 2020-03-05 DIAGNOSIS — E1122 Type 2 diabetes mellitus with diabetic chronic kidney disease: Secondary | ICD-10-CM | POA: Diagnosis not present

## 2020-03-05 DIAGNOSIS — G20 Parkinson's disease: Secondary | ICD-10-CM | POA: Diagnosis not present

## 2020-03-08 DIAGNOSIS — G20 Parkinson's disease: Secondary | ICD-10-CM | POA: Diagnosis not present

## 2020-03-08 DIAGNOSIS — E1122 Type 2 diabetes mellitus with diabetic chronic kidney disease: Secondary | ICD-10-CM | POA: Diagnosis not present

## 2020-03-08 DIAGNOSIS — A4150 Gram-negative sepsis, unspecified: Secondary | ICD-10-CM | POA: Diagnosis not present

## 2020-03-08 DIAGNOSIS — E538 Deficiency of other specified B group vitamins: Secondary | ICD-10-CM | POA: Diagnosis not present

## 2020-03-08 DIAGNOSIS — I129 Hypertensive chronic kidney disease with stage 1 through stage 4 chronic kidney disease, or unspecified chronic kidney disease: Secondary | ICD-10-CM | POA: Diagnosis not present

## 2020-03-08 DIAGNOSIS — E1140 Type 2 diabetes mellitus with diabetic neuropathy, unspecified: Secondary | ICD-10-CM | POA: Diagnosis not present

## 2020-03-08 DIAGNOSIS — N179 Acute kidney failure, unspecified: Secondary | ICD-10-CM | POA: Diagnosis not present

## 2020-03-08 DIAGNOSIS — N183 Chronic kidney disease, stage 3 unspecified: Secondary | ICD-10-CM | POA: Diagnosis not present

## 2020-03-08 DIAGNOSIS — B9620 Unspecified Escherichia coli [E. coli] as the cause of diseases classified elsewhere: Secondary | ICD-10-CM | POA: Diagnosis not present

## 2020-03-08 DIAGNOSIS — R6521 Severe sepsis with septic shock: Secondary | ICD-10-CM | POA: Diagnosis not present

## 2020-03-08 DIAGNOSIS — N390 Urinary tract infection, site not specified: Secondary | ICD-10-CM | POA: Diagnosis not present

## 2020-03-08 DIAGNOSIS — E876 Hypokalemia: Secondary | ICD-10-CM | POA: Diagnosis not present

## 2020-03-09 DIAGNOSIS — I129 Hypertensive chronic kidney disease with stage 1 through stage 4 chronic kidney disease, or unspecified chronic kidney disease: Secondary | ICD-10-CM | POA: Diagnosis not present

## 2020-03-09 DIAGNOSIS — N390 Urinary tract infection, site not specified: Secondary | ICD-10-CM | POA: Diagnosis not present

## 2020-03-09 DIAGNOSIS — B9620 Unspecified Escherichia coli [E. coli] as the cause of diseases classified elsewhere: Secondary | ICD-10-CM | POA: Diagnosis not present

## 2020-03-09 DIAGNOSIS — E1122 Type 2 diabetes mellitus with diabetic chronic kidney disease: Secondary | ICD-10-CM | POA: Diagnosis not present

## 2020-03-09 DIAGNOSIS — R6521 Severe sepsis with septic shock: Secondary | ICD-10-CM | POA: Diagnosis not present

## 2020-03-09 DIAGNOSIS — G20 Parkinson's disease: Secondary | ICD-10-CM | POA: Diagnosis not present

## 2020-03-09 DIAGNOSIS — N183 Chronic kidney disease, stage 3 unspecified: Secondary | ICD-10-CM | POA: Diagnosis not present

## 2020-03-09 DIAGNOSIS — N179 Acute kidney failure, unspecified: Secondary | ICD-10-CM | POA: Diagnosis not present

## 2020-03-09 DIAGNOSIS — A4150 Gram-negative sepsis, unspecified: Secondary | ICD-10-CM | POA: Diagnosis not present

## 2020-03-09 DIAGNOSIS — E1140 Type 2 diabetes mellitus with diabetic neuropathy, unspecified: Secondary | ICD-10-CM | POA: Diagnosis not present

## 2020-03-09 DIAGNOSIS — E876 Hypokalemia: Secondary | ICD-10-CM | POA: Diagnosis not present

## 2020-03-09 DIAGNOSIS — E538 Deficiency of other specified B group vitamins: Secondary | ICD-10-CM | POA: Diagnosis not present

## 2020-03-11 DIAGNOSIS — R6521 Severe sepsis with septic shock: Secondary | ICD-10-CM | POA: Diagnosis not present

## 2020-03-11 DIAGNOSIS — E1140 Type 2 diabetes mellitus with diabetic neuropathy, unspecified: Secondary | ICD-10-CM | POA: Diagnosis not present

## 2020-03-11 DIAGNOSIS — E538 Deficiency of other specified B group vitamins: Secondary | ICD-10-CM | POA: Diagnosis not present

## 2020-03-11 DIAGNOSIS — E1122 Type 2 diabetes mellitus with diabetic chronic kidney disease: Secondary | ICD-10-CM | POA: Diagnosis not present

## 2020-03-11 DIAGNOSIS — B9620 Unspecified Escherichia coli [E. coli] as the cause of diseases classified elsewhere: Secondary | ICD-10-CM | POA: Diagnosis not present

## 2020-03-11 DIAGNOSIS — G20 Parkinson's disease: Secondary | ICD-10-CM | POA: Diagnosis not present

## 2020-03-11 DIAGNOSIS — A4150 Gram-negative sepsis, unspecified: Secondary | ICD-10-CM | POA: Diagnosis not present

## 2020-03-11 DIAGNOSIS — N390 Urinary tract infection, site not specified: Secondary | ICD-10-CM | POA: Diagnosis not present

## 2020-03-11 DIAGNOSIS — N183 Chronic kidney disease, stage 3 unspecified: Secondary | ICD-10-CM | POA: Diagnosis not present

## 2020-03-11 DIAGNOSIS — I129 Hypertensive chronic kidney disease with stage 1 through stage 4 chronic kidney disease, or unspecified chronic kidney disease: Secondary | ICD-10-CM | POA: Diagnosis not present

## 2020-03-11 DIAGNOSIS — E876 Hypokalemia: Secondary | ICD-10-CM | POA: Diagnosis not present

## 2020-03-11 DIAGNOSIS — N179 Acute kidney failure, unspecified: Secondary | ICD-10-CM | POA: Diagnosis not present

## 2020-03-19 ENCOUNTER — Telehealth: Payer: Self-pay | Admitting: Internal Medicine

## 2020-03-19 DIAGNOSIS — G20 Parkinson's disease: Secondary | ICD-10-CM | POA: Diagnosis not present

## 2020-03-19 DIAGNOSIS — A4150 Gram-negative sepsis, unspecified: Secondary | ICD-10-CM | POA: Diagnosis not present

## 2020-03-19 DIAGNOSIS — I129 Hypertensive chronic kidney disease with stage 1 through stage 4 chronic kidney disease, or unspecified chronic kidney disease: Secondary | ICD-10-CM | POA: Diagnosis not present

## 2020-03-19 DIAGNOSIS — R6521 Severe sepsis with septic shock: Secondary | ICD-10-CM | POA: Diagnosis not present

## 2020-03-19 DIAGNOSIS — E1140 Type 2 diabetes mellitus with diabetic neuropathy, unspecified: Secondary | ICD-10-CM | POA: Diagnosis not present

## 2020-03-19 DIAGNOSIS — N179 Acute kidney failure, unspecified: Secondary | ICD-10-CM | POA: Diagnosis not present

## 2020-03-19 DIAGNOSIS — N390 Urinary tract infection, site not specified: Secondary | ICD-10-CM | POA: Diagnosis not present

## 2020-03-19 DIAGNOSIS — E1122 Type 2 diabetes mellitus with diabetic chronic kidney disease: Secondary | ICD-10-CM | POA: Diagnosis not present

## 2020-03-19 DIAGNOSIS — N183 Chronic kidney disease, stage 3 unspecified: Secondary | ICD-10-CM | POA: Diagnosis not present

## 2020-03-19 DIAGNOSIS — B9620 Unspecified Escherichia coli [E. coli] as the cause of diseases classified elsewhere: Secondary | ICD-10-CM | POA: Diagnosis not present

## 2020-03-19 DIAGNOSIS — E876 Hypokalemia: Secondary | ICD-10-CM | POA: Diagnosis not present

## 2020-03-19 DIAGNOSIS — E538 Deficiency of other specified B group vitamins: Secondary | ICD-10-CM | POA: Diagnosis not present

## 2020-03-19 NOTE — Telephone Encounter (Signed)
Left detailed message with verbal orders for below.  

## 2020-03-19 NOTE — Telephone Encounter (Signed)
ok 

## 2020-03-19 NOTE — Telephone Encounter (Signed)
    [facility] calling for orders to extend PT 1x week for 3 weeks 2x week for 1 week  Please call Kelyy at [phone]

## 2020-03-22 ENCOUNTER — Other Ambulatory Visit: Payer: Self-pay | Admitting: Internal Medicine

## 2020-03-23 DIAGNOSIS — Z87891 Personal history of nicotine dependence: Secondary | ICD-10-CM

## 2020-03-23 DIAGNOSIS — K219 Gastro-esophageal reflux disease without esophagitis: Secondary | ICD-10-CM

## 2020-03-23 DIAGNOSIS — E538 Deficiency of other specified B group vitamins: Secondary | ICD-10-CM

## 2020-03-23 DIAGNOSIS — Z9013 Acquired absence of bilateral breasts and nipples: Secondary | ICD-10-CM

## 2020-03-23 DIAGNOSIS — R6521 Severe sepsis with septic shock: Secondary | ICD-10-CM | POA: Diagnosis not present

## 2020-03-23 DIAGNOSIS — Z86010 Personal history of colonic polyps: Secondary | ICD-10-CM

## 2020-03-23 DIAGNOSIS — Z90722 Acquired absence of ovaries, bilateral: Secondary | ICD-10-CM

## 2020-03-23 DIAGNOSIS — Z853 Personal history of malignant neoplasm of breast: Secondary | ICD-10-CM

## 2020-03-23 DIAGNOSIS — N390 Urinary tract infection, site not specified: Secondary | ICD-10-CM | POA: Diagnosis not present

## 2020-03-23 DIAGNOSIS — N179 Acute kidney failure, unspecified: Secondary | ICD-10-CM | POA: Diagnosis not present

## 2020-03-23 DIAGNOSIS — G20 Parkinson's disease: Secondary | ICD-10-CM

## 2020-03-23 DIAGNOSIS — Z7984 Long term (current) use of oral hypoglycemic drugs: Secondary | ICD-10-CM

## 2020-03-23 DIAGNOSIS — B9620 Unspecified Escherichia coli [E. coli] as the cause of diseases classified elsewhere: Secondary | ICD-10-CM | POA: Diagnosis not present

## 2020-03-23 DIAGNOSIS — Z7982 Long term (current) use of aspirin: Secondary | ICD-10-CM

## 2020-03-23 DIAGNOSIS — I129 Hypertensive chronic kidney disease with stage 1 through stage 4 chronic kidney disease, or unspecified chronic kidney disease: Secondary | ICD-10-CM | POA: Diagnosis not present

## 2020-03-23 DIAGNOSIS — E1140 Type 2 diabetes mellitus with diabetic neuropathy, unspecified: Secondary | ICD-10-CM

## 2020-03-23 DIAGNOSIS — Z90710 Acquired absence of both cervix and uterus: Secondary | ICD-10-CM

## 2020-03-23 DIAGNOSIS — N183 Chronic kidney disease, stage 3 unspecified: Secondary | ICD-10-CM

## 2020-03-23 DIAGNOSIS — E876 Hypokalemia: Secondary | ICD-10-CM

## 2020-03-23 DIAGNOSIS — K589 Irritable bowel syndrome without diarrhea: Secondary | ICD-10-CM

## 2020-03-23 DIAGNOSIS — E1122 Type 2 diabetes mellitus with diabetic chronic kidney disease: Secondary | ICD-10-CM

## 2020-03-23 DIAGNOSIS — Z9181 History of falling: Secondary | ICD-10-CM

## 2020-03-24 DIAGNOSIS — R6521 Severe sepsis with septic shock: Secondary | ICD-10-CM | POA: Diagnosis not present

## 2020-03-24 DIAGNOSIS — N390 Urinary tract infection, site not specified: Secondary | ICD-10-CM | POA: Diagnosis not present

## 2020-03-24 DIAGNOSIS — E1140 Type 2 diabetes mellitus with diabetic neuropathy, unspecified: Secondary | ICD-10-CM | POA: Diagnosis not present

## 2020-03-24 DIAGNOSIS — E538 Deficiency of other specified B group vitamins: Secondary | ICD-10-CM | POA: Diagnosis not present

## 2020-03-24 DIAGNOSIS — N183 Chronic kidney disease, stage 3 unspecified: Secondary | ICD-10-CM | POA: Diagnosis not present

## 2020-03-24 DIAGNOSIS — E1122 Type 2 diabetes mellitus with diabetic chronic kidney disease: Secondary | ICD-10-CM | POA: Diagnosis not present

## 2020-03-24 DIAGNOSIS — A4150 Gram-negative sepsis, unspecified: Secondary | ICD-10-CM | POA: Diagnosis not present

## 2020-03-24 DIAGNOSIS — I129 Hypertensive chronic kidney disease with stage 1 through stage 4 chronic kidney disease, or unspecified chronic kidney disease: Secondary | ICD-10-CM | POA: Diagnosis not present

## 2020-03-24 DIAGNOSIS — E876 Hypokalemia: Secondary | ICD-10-CM | POA: Diagnosis not present

## 2020-03-24 DIAGNOSIS — G20 Parkinson's disease: Secondary | ICD-10-CM | POA: Diagnosis not present

## 2020-03-24 DIAGNOSIS — N179 Acute kidney failure, unspecified: Secondary | ICD-10-CM | POA: Diagnosis not present

## 2020-03-24 DIAGNOSIS — B9620 Unspecified Escherichia coli [E. coli] as the cause of diseases classified elsewhere: Secondary | ICD-10-CM | POA: Diagnosis not present

## 2020-03-29 DIAGNOSIS — G20 Parkinson's disease: Secondary | ICD-10-CM | POA: Diagnosis not present

## 2020-03-29 DIAGNOSIS — E876 Hypokalemia: Secondary | ICD-10-CM | POA: Diagnosis not present

## 2020-03-29 DIAGNOSIS — E1122 Type 2 diabetes mellitus with diabetic chronic kidney disease: Secondary | ICD-10-CM | POA: Diagnosis not present

## 2020-03-29 DIAGNOSIS — E1140 Type 2 diabetes mellitus with diabetic neuropathy, unspecified: Secondary | ICD-10-CM | POA: Diagnosis not present

## 2020-03-29 DIAGNOSIS — B9620 Unspecified Escherichia coli [E. coli] as the cause of diseases classified elsewhere: Secondary | ICD-10-CM | POA: Diagnosis not present

## 2020-03-29 DIAGNOSIS — N179 Acute kidney failure, unspecified: Secondary | ICD-10-CM | POA: Diagnosis not present

## 2020-03-29 DIAGNOSIS — N390 Urinary tract infection, site not specified: Secondary | ICD-10-CM | POA: Diagnosis not present

## 2020-03-29 DIAGNOSIS — R6521 Severe sepsis with septic shock: Secondary | ICD-10-CM | POA: Diagnosis not present

## 2020-03-29 DIAGNOSIS — N183 Chronic kidney disease, stage 3 unspecified: Secondary | ICD-10-CM | POA: Diagnosis not present

## 2020-03-29 DIAGNOSIS — A4150 Gram-negative sepsis, unspecified: Secondary | ICD-10-CM | POA: Diagnosis not present

## 2020-03-29 DIAGNOSIS — E538 Deficiency of other specified B group vitamins: Secondary | ICD-10-CM | POA: Diagnosis not present

## 2020-03-29 DIAGNOSIS — I129 Hypertensive chronic kidney disease with stage 1 through stage 4 chronic kidney disease, or unspecified chronic kidney disease: Secondary | ICD-10-CM | POA: Diagnosis not present

## 2020-03-30 DIAGNOSIS — I480 Paroxysmal atrial fibrillation: Secondary | ICD-10-CM | POA: Diagnosis not present

## 2020-04-05 DIAGNOSIS — E538 Deficiency of other specified B group vitamins: Secondary | ICD-10-CM | POA: Diagnosis not present

## 2020-04-05 DIAGNOSIS — I129 Hypertensive chronic kidney disease with stage 1 through stage 4 chronic kidney disease, or unspecified chronic kidney disease: Secondary | ICD-10-CM | POA: Diagnosis not present

## 2020-04-05 DIAGNOSIS — E1122 Type 2 diabetes mellitus with diabetic chronic kidney disease: Secondary | ICD-10-CM | POA: Diagnosis not present

## 2020-04-05 DIAGNOSIS — G20 Parkinson's disease: Secondary | ICD-10-CM | POA: Diagnosis not present

## 2020-04-05 DIAGNOSIS — E1140 Type 2 diabetes mellitus with diabetic neuropathy, unspecified: Secondary | ICD-10-CM | POA: Diagnosis not present

## 2020-04-05 DIAGNOSIS — B9620 Unspecified Escherichia coli [E. coli] as the cause of diseases classified elsewhere: Secondary | ICD-10-CM | POA: Diagnosis not present

## 2020-04-05 DIAGNOSIS — A4150 Gram-negative sepsis, unspecified: Secondary | ICD-10-CM | POA: Diagnosis not present

## 2020-04-05 DIAGNOSIS — R6521 Severe sepsis with septic shock: Secondary | ICD-10-CM | POA: Diagnosis not present

## 2020-04-05 DIAGNOSIS — N390 Urinary tract infection, site not specified: Secondary | ICD-10-CM | POA: Diagnosis not present

## 2020-04-05 DIAGNOSIS — N183 Chronic kidney disease, stage 3 unspecified: Secondary | ICD-10-CM | POA: Diagnosis not present

## 2020-04-05 DIAGNOSIS — N179 Acute kidney failure, unspecified: Secondary | ICD-10-CM | POA: Diagnosis not present

## 2020-04-05 DIAGNOSIS — E876 Hypokalemia: Secondary | ICD-10-CM | POA: Diagnosis not present

## 2020-04-14 ENCOUNTER — Telehealth: Payer: Self-pay | Admitting: Internal Medicine

## 2020-04-14 DIAGNOSIS — E1140 Type 2 diabetes mellitus with diabetic neuropathy, unspecified: Secondary | ICD-10-CM | POA: Diagnosis not present

## 2020-04-14 DIAGNOSIS — E876 Hypokalemia: Secondary | ICD-10-CM | POA: Diagnosis not present

## 2020-04-14 DIAGNOSIS — R6521 Severe sepsis with septic shock: Secondary | ICD-10-CM | POA: Diagnosis not present

## 2020-04-14 DIAGNOSIS — A4150 Gram-negative sepsis, unspecified: Secondary | ICD-10-CM | POA: Diagnosis not present

## 2020-04-14 DIAGNOSIS — N179 Acute kidney failure, unspecified: Secondary | ICD-10-CM | POA: Diagnosis not present

## 2020-04-14 DIAGNOSIS — B9620 Unspecified Escherichia coli [E. coli] as the cause of diseases classified elsewhere: Secondary | ICD-10-CM | POA: Diagnosis not present

## 2020-04-14 DIAGNOSIS — I129 Hypertensive chronic kidney disease with stage 1 through stage 4 chronic kidney disease, or unspecified chronic kidney disease: Secondary | ICD-10-CM | POA: Diagnosis not present

## 2020-04-14 DIAGNOSIS — E1122 Type 2 diabetes mellitus with diabetic chronic kidney disease: Secondary | ICD-10-CM | POA: Diagnosis not present

## 2020-04-14 DIAGNOSIS — G20 Parkinson's disease: Secondary | ICD-10-CM | POA: Diagnosis not present

## 2020-04-14 DIAGNOSIS — N390 Urinary tract infection, site not specified: Secondary | ICD-10-CM | POA: Diagnosis not present

## 2020-04-14 DIAGNOSIS — N183 Chronic kidney disease, stage 3 unspecified: Secondary | ICD-10-CM | POA: Diagnosis not present

## 2020-04-14 DIAGNOSIS — E538 Deficiency of other specified B group vitamins: Secondary | ICD-10-CM | POA: Diagnosis not present

## 2020-04-14 NOTE — Telephone Encounter (Signed)
noted 

## 2020-04-14 NOTE — Telephone Encounter (Signed)
  Beth from [facility] calling to report patient had a recent fall, Bruise on arm and chin No other injury No call back needed

## 2020-04-15 ENCOUNTER — Telehealth: Payer: Self-pay

## 2020-04-15 DIAGNOSIS — R6521 Severe sepsis with septic shock: Secondary | ICD-10-CM | POA: Diagnosis not present

## 2020-04-15 DIAGNOSIS — A4150 Gram-negative sepsis, unspecified: Secondary | ICD-10-CM | POA: Diagnosis not present

## 2020-04-15 DIAGNOSIS — E538 Deficiency of other specified B group vitamins: Secondary | ICD-10-CM | POA: Diagnosis not present

## 2020-04-15 DIAGNOSIS — N183 Chronic kidney disease, stage 3 unspecified: Secondary | ICD-10-CM | POA: Diagnosis not present

## 2020-04-15 DIAGNOSIS — E1122 Type 2 diabetes mellitus with diabetic chronic kidney disease: Secondary | ICD-10-CM | POA: Diagnosis not present

## 2020-04-15 DIAGNOSIS — I129 Hypertensive chronic kidney disease with stage 1 through stage 4 chronic kidney disease, or unspecified chronic kidney disease: Secondary | ICD-10-CM | POA: Diagnosis not present

## 2020-04-15 DIAGNOSIS — E876 Hypokalemia: Secondary | ICD-10-CM | POA: Diagnosis not present

## 2020-04-15 DIAGNOSIS — G20 Parkinson's disease: Secondary | ICD-10-CM | POA: Diagnosis not present

## 2020-04-15 DIAGNOSIS — E1140 Type 2 diabetes mellitus with diabetic neuropathy, unspecified: Secondary | ICD-10-CM | POA: Diagnosis not present

## 2020-04-15 DIAGNOSIS — N390 Urinary tract infection, site not specified: Secondary | ICD-10-CM | POA: Diagnosis not present

## 2020-04-15 DIAGNOSIS — N179 Acute kidney failure, unspecified: Secondary | ICD-10-CM | POA: Diagnosis not present

## 2020-04-15 DIAGNOSIS — B9620 Unspecified Escherichia coli [E. coli] as the cause of diseases classified elsewhere: Secondary | ICD-10-CM | POA: Diagnosis not present

## 2020-04-15 NOTE — Telephone Encounter (Signed)
Ok for verbal 

## 2020-04-15 NOTE — Telephone Encounter (Signed)
New message    [facility] calling asking for referral    Outpatient physical therapy for balance and weight training   To be [facility] to [facility]   Fax # [phone]   Phone # [phone]

## 2020-04-15 NOTE — Telephone Encounter (Signed)
Spoke with [facility] today. They are going to fax over referral form to be completed and we just need to complete and send back with demographics and insurance card.

## 2020-04-16 NOTE — Telephone Encounter (Signed)
Faxed received. Waiting for completion to fax back.

## 2020-04-26 ENCOUNTER — Telehealth: Payer: Self-pay

## 2020-04-27 ENCOUNTER — Ambulatory Visit: Payer: Medicare Other | Admitting: Internal Medicine

## 2020-04-27 ENCOUNTER — Encounter: Payer: Self-pay | Admitting: Internal Medicine

## 2020-04-27 ENCOUNTER — Other Ambulatory Visit (INDEPENDENT_AMBULATORY_CARE_PROVIDER_SITE_OTHER): Payer: Medicare Other

## 2020-04-27 VITALS — BP 130/60 | HR 84 | Ht 64.0 in | Wt 158.0 lb

## 2020-04-27 DIAGNOSIS — K760 Fatty (change of) liver, not elsewhere classified: Secondary | ICD-10-CM

## 2020-04-27 DIAGNOSIS — E8881 Metabolic syndrome: Secondary | ICD-10-CM

## 2020-04-27 DIAGNOSIS — E669 Obesity, unspecified: Secondary | ICD-10-CM

## 2020-04-27 LAB — CBC WITH DIFFERENTIAL/PLATELET
Basophils Absolute: 0 10*3/uL (ref 0.0–0.1)
Basophils Relative: 0.5 % (ref 0.0–3.0)
Eosinophils Absolute: 0.5 10*3/uL (ref 0.0–0.7)
Eosinophils Relative: 8.7 % — ABNORMAL HIGH (ref 0.0–5.0)
HCT: 37.1 % (ref 36.0–46.0)
Hemoglobin: 12.4 g/dL (ref 12.0–15.0)
Lymphocytes Relative: 22.5 % (ref 12.0–46.0)
Lymphs Abs: 1.2 10*3/uL (ref 0.7–4.0)
MCHC: 33.4 g/dL (ref 30.0–36.0)
MCV: 87.1 fl (ref 78.0–100.0)
Monocytes Absolute: 0.6 10*3/uL (ref 0.1–1.0)
Monocytes Relative: 11.6 % (ref 3.0–12.0)
Neutro Abs: 3.1 10*3/uL (ref 1.4–7.7)
Neutrophils Relative %: 56.7 % (ref 43.0–77.0)
Platelets: 162 10*3/uL (ref 150.0–400.0)
RBC: 4.27 Mil/uL (ref 3.87–5.11)
RDW: 14.4 % (ref 11.5–15.5)
WBC: 5.5 10*3/uL (ref 4.0–10.5)

## 2020-04-27 LAB — COMPREHENSIVE METABOLIC PANEL
ALT: 6 U/L (ref 0–35)
AST: 25 U/L (ref 0–37)
Albumin: 3.9 g/dL (ref 3.5–5.2)
Alkaline Phosphatase: 55 U/L (ref 39–117)
BUN: 15 mg/dL (ref 6–23)
CO2: 29 mEq/L (ref 19–32)
Calcium: 9.8 mg/dL (ref 8.4–10.5)
Chloride: 105 mEq/L (ref 96–112)
Creatinine, Ser: 1.02 mg/dL (ref 0.40–1.20)
GFR: 52.33 mL/min — ABNORMAL LOW (ref >60.00)
Glucose, Bld: 137 mg/dL — ABNORMAL HIGH (ref 70–99)
Potassium: 3.9 mEq/L (ref 3.5–5.1)
Sodium: 141 mEq/L (ref 135–145)
Total Bilirubin: 1.2 mg/dL (ref 0.2–1.2)
Total Protein: 7.1 g/dL (ref 6.0–8.3)

## 2020-04-27 LAB — PROTIME-INR
INR: 1.1 ratio — ABNORMAL HIGH (ref 0.8–1.0)
Prothrombin Time: 11.9 s (ref 9.6–13.1)

## 2020-04-27 NOTE — Patient Instructions (Signed)
I do not tink you have cirrhosis.  We will do some labs today and I will let you know more after that.  As we discussed sugar and carbs (processed like bread) are the enemy.  Try to cut your bread consumption and any other processed carbs (packaged snacks, candy, etc). Eat a higher amount of proteins.  Take a look at www.dietdoctor.com and see if you can join for a free 30 day trial and look at the fatty liver info and low carb diets with restricted eating time to 8-10 hours a day. I am not  recommending long intermittent fasts for you.  The Mediterranean diet is an example of a lower carb diet also and I am including that as an example also.  I appreciate the opportunity to care for you. Gatha Mayer, MD, [facility]

## 2020-04-27 NOTE — Progress Notes (Signed)
[name] 79 y.o. 01-23-1941 [id number]  Assessment & Plan:   Encounter Diagnoses  Name Primary?  Marland Kitchen NAFLD (nonalcoholic fatty liver disease) Yes  . Abdominal obesity and metabolic syndrome     Lab Results  Component Value Date   WBC 5.5 04/27/2020   HGB 12.4 04/27/2020   HCT 37.1 04/27/2020   MCV 87.1 04/27/2020   PLT 162.0 04/27/2020   Lab Results  Component Value Date   ALT 6 04/27/2020   AST 25 04/27/2020   ALKPHOS 55 04/27/2020   BILITOT 1.2 04/27/2020    Lab Results  Component Value Date   INR 1.1 (H) 04/27/2020   INR 1.4 (H) 01/31/2020   INR 1.3 (H) 01/31/2020  Labs as above.    Her NAFLD fibrosis score is 4.3 meaning she could have F3 to F4 fibrosis.  F4 is the worst and technically cirrhosis.    If she did have cirrhosis it does not seem to be clinically relevant i.e. it certainly not decompensated.  She is going to work on trying to lose metabolically active belly fat.  I have given her some information on restrictive feeding and lower carb diets (diet https://moore.biz/) 88 Jazma an cough d a Mediterranean diet which is appropriate to try.  I will plan to see her in 1 year sooner if needed.  It would make sense to have LFTs and CBC in 6 months or so through primary care.  These could be done at her December follow-up visit with Dr. [name].  I have also provided handouts I appreciate the opportunity to care for this patient. CC: Carol Rail, MD      Subjective:   Chief Complaint: Abnormal liver on CT question cirrhosis  HPI [name] is a 79 year old white woman here because a CT scan showed an irregular lobular type border of the liver when she was having imaging related to her urosepsis admission last spring.  She has a history of fatty liver diagnosed through labs and ultrasound imaging years ago.  She is recovering from her uro she has lost some weight both intentionally and unintentionally with this recent illness.  She is recovering from the sepsis  regaining strength etc.  She saw Dr. [name] her primary care provider in the recent past and was referred for evaluation of this abnormality on CT.  She denies any jaundice or liver problems that she knows of.  She is aware that she had the diagnosis of fatty liver.  She is somewhat curious about changing diet to try to reduce abdominal obesity.  We reviewed the images of the CT scan in person together the patient myself and her husband.  Note it was without contrast IV and it did not show any splenomegaly or signs of portal hypertension.  Years ago she was negative for hepatitis A, B, and C.  This was at the time of her fatty liver diagnosis.  She does not drink alcohol to any significant degree and there is no family history of liver disease.  The most recent labs are from May 21 and she had an AST of 48 it was up a bit during that hospitalization her ALT was 30.  Previously had been normal.  Platelet count normal at 263.  INR 1.4 in the setting of the sepsis. She had a negative antinuclear antibody in 2018 as part of an arthritis evaluation.  Last colonoscopy 2011 no polyps prior to that she had had numerous polyps ablated are destroyed.  Never had  any proof of adenomas.  2011 also had esophageal ring dilated no varices seen no signs of portal hypertension whatsoever. Allergies  Allergen Reactions  . Sulfur     Flushed, funny feeling in throat   . Exemestane Nausea Only    Other reaction(s): Dizziness (intolerance)  . Morphine And Related   . Statins Other (See Comments)    Elevated LFTs  . Ace Inhibitors     REACTION: COUGH  . Codeine     REACTION: VOMITTING   Current Meds  Medication Sig  . aspirin 81 MG chewable tablet Chew 1 tablet (81 mg total) by mouth daily.  . carbidopa-levodopa (SINEMET IR) 25-100 MG tablet TAKE 1 TABLET BY MOUTH 3 TIMES A DAY.  . carvedilol (COREG) 25 MG tablet Take 1 tablet (25 mg total) by mouth 2 (two) times daily with a meal.  . Cyanocobalamin (VITAMIN B-12)  5000 MCG SUBL Place 5,000 mcg under the tongue daily.   . metFORMIN (GLUCOPHAGE) 500 MG tablet TAKE 2 TABLETS TWICE A DAY WITH MEALS.  . Multiple Vitamin (MULTIVITAMIN WITH MINERALS) TABS tablet Take 1 tablet by mouth daily.  . Omega-3 Fatty Acids (FISH OIL CONCENTRATE) 1000 MG CAPS Take 1,000 mg by mouth 2 (two) times daily.    Past Medical History:  Diagnosis Date  . Acute renal failure superimposed on stage 3 chronic kidney disease ([place]) 01/31/2020  . Breast cancer ([place]) 2012  . 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 She underwent right total mastectomy with sentinel node biopsy and left total (prophylactic) mastectomy on 10/12/2010. She underwent immediate reconstruction with expander and AlloDerm placement. 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 One lymph  . Complicated UTI (urinary tract infection) 01/31/2020  . Diabetes mellitus   . Dizziness 02/24/2019  . Elevated LFTs 08/10/2014  . Episodic lightheadedness 08/27/2019  . Esophageal reflux 05/06/2008  . Essential hypertension 02/16/2009  . Family history of breast cancer   . Family history of ovarian cancer   . Fatigue 02/24/2019  . Fatty liver 08/05/2015  . Genetic testing 09/04/2017   Negative genetic testing on the common hereditary cancer panel.  The Hereditary Gene Panel offered by [facility] includes sequencing and/or deletion duplication testing of the following 47 genes: APC, ATM, AXIN2, BARD1, BMPR1A, BRCA1, BRCA2, BRIP1, CDH1, CDK4, CDKN2A (p14ARF), CDKN2A (p16INK4a), CHEK2, CTNNA1, DICER1, EPCAM (Deletion/duplication testing only), GREM1 (promoter region deletion/duplicat  . GERD (gastroesophageal reflux disease)   . Gilbert syndrome   . GILBERT'S SYNDROME 02/06/2007   Qualifier: Diagnosis of  By: Janelle Floor    . History of breast cancer 08/04/2015    S/p b/l mastectomy  . History of colonic polyps 06/06/2010   Annotation: destroyed, no clear adenomatous proven Qualifier: Diagnosis of  By: Carlean Purl MD, Tonna Boehringer E  2008 polyp 2013 neg    . Hypertension   . Hypertriglyceridemia 09/30/2007   statin intolerant  Father MI @ 77 Sister CVA > 30   . Hypokalemia due to inadequate potassium intake 01/31/2020  . IBS (irritable bowel syndrome)   . Joint pain 09/02/2010  . Lactic acidosis 01/31/2020  . Osteoarthritis of left knee 08/04/2015   Significant arthritis in knees, limits her activity   . Osteopenia 08/31/2013   [facility]  DEXA 03/20/2018: Osteopenia:  LFN -1.3, R--1.0, spine -0.6-statistically significant increase in BMD in all areas  Findings  08/22/13 : lowest T score -  1.3  @  R femoral neck (hip) ,3.4  %loss @ R femur &   4.1 % in spine   since  2012 Dexa 01/05/16: lowest  - L femur neck T -1.6 Diagnosis: mild Osteopenia Rx: Fosamax remotely; now on Arimidex   . 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 Peripheral neuropathy 08/23/2018  . Plantar fasciitis of right foot    Dr Oneta Rack  . Poor balance 02/20/2018  . S/P bilateral mastectomy 08/04/2015  . Septic shock ([place]) 01/31/2020  . Severe sepsis with acute organ dysfunction due to gram-negative bacteria ([place]) 01/31/2020  . Transfusion history 1976  . Type 2 diabetes mellitus with stage 3 chronic kidney disease, without long-term current use of insulin ([place]) 08/11/2008   Ophth, Dr Kathrin Penner: no retinopathy  Diabetes maternal grandmother   Past Surgical History:  Procedure Laterality Date  . ABDOMINAL HYSTERECTOMY  1976   with BSO due to infection from [facility] IUD  . APPENDECTOMY  1976   @ [place]     [id number], [phone]  . CATARACT EXTRACTION, BILATERAL Bilateral 2018  . COLONOSCOPY W/ POLYPECTOMY  2006   negative 2011; Dr Carlean Purl  . MASTECTOMY W/ NODES PARTIAL  2012   double mastectomy with nodes taken out on right side   . PLACEMENT OF BREAST  IMPLANTS  04/2011   Dr Migdalia Dk, [facility]  . TONSILLECTOMY AND ADENOIDECTOMY     Social History   Social History Narrative   Walking for exercise   family history includes Breast cancer (age of onset: 52) in her maternal aunt; Breast cancer (age of onset: 24) in her sister; Breast cancer (age of onset: 21) in her maternal aunt; Diabetes in her maternal grandmother; Heart attack (age of onset: 42) in her father; Heart failure in her sister; Hypertension in her sister; Ovarian cancer in her paternal grandmother; Stroke in her mother; Stroke (age of onset: 75) in her sister.   Review of Systems As per HPI.  Gradually recovering from the weakness related to her sepsis.  Other review of systems appear negative.  Objective:   Physical Exam BP 130/60   Pulse 84   Ht '5\' 4"'$  (1.626 m)   Wt 158 lb (71.7 kg)   BMI 27.12 kg/m  Elderly somewhat frail NAD ww Anicteric Lungs cta Cor s1s2 no rmg abd obese mild-mod and no HSM/mass Skin - no stigmata chronic liver disease Alert and oriented x3  Data reviewed CT imaging labs hospitalization records primary care notes prior GI notes as per HPI also

## 2020-05-03 ENCOUNTER — Other Ambulatory Visit: Payer: Self-pay | Admitting: Internal Medicine

## 2020-05-03 DIAGNOSIS — R259 Unspecified abnormal involuntary movements: Secondary | ICD-10-CM | POA: Diagnosis not present

## 2020-05-03 DIAGNOSIS — G20 Parkinson's disease: Secondary | ICD-10-CM | POA: Diagnosis not present

## 2020-05-05 DIAGNOSIS — L821 Other seborrheic keratosis: Secondary | ICD-10-CM | POA: Diagnosis not present

## 2020-05-05 DIAGNOSIS — D225 Melanocytic nevi of trunk: Secondary | ICD-10-CM | POA: Diagnosis not present

## 2020-05-05 DIAGNOSIS — L820 Inflamed seborrheic keratosis: Secondary | ICD-10-CM | POA: Diagnosis not present

## 2020-05-05 DIAGNOSIS — L570 Actinic keratosis: Secondary | ICD-10-CM | POA: Diagnosis not present

## 2020-05-05 DIAGNOSIS — D692 Other nonthrombocytopenic purpura: Secondary | ICD-10-CM | POA: Diagnosis not present

## 2020-05-06 DIAGNOSIS — G20 Parkinson's disease: Secondary | ICD-10-CM | POA: Diagnosis not present

## 2020-05-06 DIAGNOSIS — R259 Unspecified abnormal involuntary movements: Secondary | ICD-10-CM | POA: Diagnosis not present

## 2020-05-11 DIAGNOSIS — R259 Unspecified abnormal involuntary movements: Secondary | ICD-10-CM | POA: Diagnosis not present

## 2020-05-11 DIAGNOSIS — G20 Parkinson's disease: Secondary | ICD-10-CM | POA: Diagnosis not present

## 2020-05-13 DIAGNOSIS — R259 Unspecified abnormal involuntary movements: Secondary | ICD-10-CM | POA: Diagnosis not present

## 2020-05-13 DIAGNOSIS — G20 Parkinson's disease: Secondary | ICD-10-CM | POA: Diagnosis not present

## 2020-05-17 DIAGNOSIS — R259 Unspecified abnormal involuntary movements: Secondary | ICD-10-CM | POA: Diagnosis not present

## 2020-05-17 DIAGNOSIS — G20 Parkinson's disease: Secondary | ICD-10-CM | POA: Diagnosis not present

## 2020-05-20 DIAGNOSIS — G20 Parkinson's disease: Secondary | ICD-10-CM | POA: Diagnosis not present

## 2020-05-20 DIAGNOSIS — R259 Unspecified abnormal involuntary movements: Secondary | ICD-10-CM | POA: Diagnosis not present

## 2020-05-21 ENCOUNTER — Telehealth: Payer: Self-pay | Admitting: Internal Medicine

## 2020-05-21 NOTE — Telephone Encounter (Signed)
Patient is calling saying she wants to go back to way she use to take her BP medication. She states it was changed in May and she is running higher. BP is staying around 176/78 and it use to stay around 120/68.   Patient refused an appointment.

## 2020-05-25 NOTE — Telephone Encounter (Signed)
I think she is referring to changes when she was hospitalized in May   She was on coreg, norvasc, clonidine and hyzaar prior to the hospital.   Cardiology stopped amlodipine/norvasc in early June b/c her BP was too low.   Now she is only on coreg.   Her BP was good in August.   We can restart amlodipine 5 mg daily and then add back on other meds if needed if she agrees.

## 2020-05-26 ENCOUNTER — Telehealth: Payer: Self-pay | Admitting: Cardiology

## 2020-05-26 MED ORDER — AMLODIPINE BESYLATE 5 MG PO TABS
5.0000 mg | ORAL_TABLET | Freq: Every day | ORAL | 2 refills | Status: DC
Start: 2020-05-26 — End: 2020-09-16

## 2020-05-26 NOTE — Telephone Encounter (Signed)
Notified pt w/MD response. Pt states she will need rx sent to [facility] for the Amlodipine. Inform pt will send electronically.Marland KitchenJohny Chess

## 2020-05-26 NOTE — Telephone Encounter (Signed)
New Message   Pt c/o BP issue:  1. What are your last 5 BP readings? 164/84 2. Are you having any other symptoms (ex. Dizziness, headache, blurred vision, passed out)? No symptoms 3. What is your medication issue?   Patients only complaint is that her BP is high.

## 2020-05-26 NOTE — Telephone Encounter (Signed)
I spoke with patient who reports BP earlier today was 164/84. Rechecked since then and is now 149/84.  Chart reviewed and patient contacted PCP previously regarding BP and recommendations have been made.  Patient will contact PCP to follow up.

## 2020-06-07 DIAGNOSIS — G20 Parkinson's disease: Secondary | ICD-10-CM | POA: Diagnosis not present

## 2020-06-07 DIAGNOSIS — R259 Unspecified abnormal involuntary movements: Secondary | ICD-10-CM | POA: Diagnosis not present

## 2020-06-09 DIAGNOSIS — H43813 Vitreous degeneration, bilateral: Secondary | ICD-10-CM | POA: Diagnosis not present

## 2020-06-09 DIAGNOSIS — H26493 Other secondary cataract, bilateral: Secondary | ICD-10-CM | POA: Diagnosis not present

## 2020-06-09 DIAGNOSIS — E119 Type 2 diabetes mellitus without complications: Secondary | ICD-10-CM | POA: Diagnosis not present

## 2020-06-09 DIAGNOSIS — H5211 Myopia, right eye: Secondary | ICD-10-CM | POA: Diagnosis not present

## 2020-06-14 DIAGNOSIS — R259 Unspecified abnormal involuntary movements: Secondary | ICD-10-CM | POA: Diagnosis not present

## 2020-06-14 DIAGNOSIS — G20 Parkinson's disease: Secondary | ICD-10-CM | POA: Diagnosis not present

## 2020-06-17 DIAGNOSIS — G20 Parkinson's disease: Secondary | ICD-10-CM | POA: Diagnosis not present

## 2020-06-17 DIAGNOSIS — R259 Unspecified abnormal involuntary movements: Secondary | ICD-10-CM | POA: Diagnosis not present

## 2020-06-21 DIAGNOSIS — G20 Parkinson's disease: Secondary | ICD-10-CM | POA: Diagnosis not present

## 2020-06-21 DIAGNOSIS — R259 Unspecified abnormal involuntary movements: Secondary | ICD-10-CM | POA: Diagnosis not present

## 2020-06-24 DIAGNOSIS — G20 Parkinson's disease: Secondary | ICD-10-CM | POA: Diagnosis not present

## 2020-06-24 DIAGNOSIS — R259 Unspecified abnormal involuntary movements: Secondary | ICD-10-CM | POA: Diagnosis not present

## 2020-06-29 DIAGNOSIS — R259 Unspecified abnormal involuntary movements: Secondary | ICD-10-CM | POA: Diagnosis not present

## 2020-06-29 DIAGNOSIS — G20 Parkinson's disease: Secondary | ICD-10-CM | POA: Diagnosis not present

## 2020-07-16 DIAGNOSIS — G20 Parkinson's disease: Secondary | ICD-10-CM | POA: Diagnosis not present

## 2020-07-16 DIAGNOSIS — R259 Unspecified abnormal involuntary movements: Secondary | ICD-10-CM | POA: Diagnosis not present

## 2020-07-17 ENCOUNTER — Ambulatory Visit (INDEPENDENT_AMBULATORY_CARE_PROVIDER_SITE_OTHER): Payer: Medicare Other

## 2020-07-17 DIAGNOSIS — Z23 Encounter for immunization: Secondary | ICD-10-CM | POA: Diagnosis not present

## 2020-07-20 DIAGNOSIS — R259 Unspecified abnormal involuntary movements: Secondary | ICD-10-CM | POA: Diagnosis not present

## 2020-07-20 DIAGNOSIS — G20 Parkinson's disease: Secondary | ICD-10-CM | POA: Diagnosis not present

## 2020-07-26 DIAGNOSIS — R259 Unspecified abnormal involuntary movements: Secondary | ICD-10-CM | POA: Diagnosis not present

## 2020-07-26 DIAGNOSIS — G20 Parkinson's disease: Secondary | ICD-10-CM | POA: Diagnosis not present

## 2020-07-29 DIAGNOSIS — R259 Unspecified abnormal involuntary movements: Secondary | ICD-10-CM | POA: Diagnosis not present

## 2020-07-29 DIAGNOSIS — G20 Parkinson's disease: Secondary | ICD-10-CM | POA: Diagnosis not present

## 2020-07-31 ENCOUNTER — Ambulatory Visit: Payer: Medicare Other | Attending: Internal Medicine

## 2020-07-31 DIAGNOSIS — Z23 Encounter for immunization: Secondary | ICD-10-CM

## 2020-07-31 NOTE — Progress Notes (Signed)
   Covid-19 Vaccination Clinic  Name:  [name]    MRN: [id number] DOB: 1941/04/13  07/31/2020  [name] was observed post Covid-19 immunization for 15 minutes without incident. She was provided with Vaccine Information Sheet and instruction to access the V-Safe system.   [name] was instructed to call 911 with any severe reactions post vaccine: Marland Kitchen Difficulty breathing  . Swelling of face and throat  . A fast heartbeat  . A bad rash all over body  . Dizziness and weakness   Immunizations Administered    Name Date Dose VIS Date Route   Pfizer COVID-19 Vaccine 07/31/2020  4:54 PM 0.3 mL 07/07/2020 Intramuscular   Manufacturer: [place]   Lot: [id number]   [place]: [id number]

## 2020-08-02 NOTE — Progress Notes (Signed)
Cardiology Office Note:    Date:  08/04/2020   ID:  [name], DOB 01/08/1941, MRN [id number]  PCP:  Binnie Rail, MD  [facility] Cardiologist:  Candee Furbish, MD  [facility] Electrophysiologist:  None   Referring MD: Binnie Rail, MD     History of Present Illness:    [name] is a 79 y.o. female here for the follow-up of paroxysmal atrial fibrillation.  This was noted during hospitalization May 2021 while on inotropes. Because of this reversible cause anticoagulation was not utilized at the time. She also has Parkinson's.  Went to a short rehab stay, back at home. Not aware of any palpitations currently.  Past Medical History:  Diagnosis Date  . Acute renal failure superimposed on stage 3 chronic kidney disease ([place]) 01/31/2020  . Breast cancer ([place]) 2012  . 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 She underwent right total mastectomy with sentinel node biopsy and left total (prophylactic) mastectomy on 10/12/2010. She underwent immediate reconstruction with expander and AlloDerm placement. 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 One lymph  . Complicated UTI (urinary tract infection) 01/31/2020  . Diabetes mellitus   . Dizziness 02/24/2019  . Elevated LFTs 08/10/2014  . Episodic lightheadedness 08/27/2019  . Esophageal reflux 05/06/2008  . Essential hypertension 02/16/2009  . Family history of breast cancer   . Family history of ovarian cancer   . Fatigue 02/24/2019  . Fatty liver 08/05/2015  . Genetic testing 09/04/2017   Negative genetic testing on the common hereditary cancer panel.  The Hereditary Gene Panel offered by [facility] includes sequencing and/or deletion duplication testing of the following 47 genes: APC, ATM, AXIN2, BARD1, BMPR1A, BRCA1, BRCA2, BRIP1, CDH1, CDK4,  CDKN2A (p14ARF), CDKN2A (p16INK4a), CHEK2, CTNNA1, DICER1, EPCAM (Deletion/duplication testing only), GREM1 (promoter region deletion/duplicat  . GERD (gastroesophageal reflux disease)   . Gilbert syndrome   . GILBERT'S SYNDROME 02/06/2007   Qualifier: Diagnosis of  By: Janelle Floor    . History of breast cancer 08/04/2015   S/p b/l mastectomy  . History of colonic polyps 06/06/2010   Annotation: destroyed, no clear adenomatous proven Qualifier: Diagnosis of  By: Carlean Purl MD, Tonna Boehringer E  2008 polyp 2013 neg    . Hypertension   . Hypertriglyceridemia 09/30/2007   statin intolerant  Father MI @ 58 Sister CVA > 35   . Hypokalemia due to inadequate potassium intake 01/31/2020  . IBS (irritable bowel syndrome)   . Joint pain 09/02/2010  . Lactic acidosis 01/31/2020  . Osteoarthritis of left knee 08/04/2015   Significant arthritis in knees, limits her activity   . Osteopenia 08/31/2013   [facility]  DEXA 03/20/2018: Osteopenia:  LFN -1.3, R--1.0, spine -0.6-statistically significant increase in BMD in all areas  Findings 08/22/13 : lowest T score -  1.3  @  R femoral neck (hip) ,3.4  %loss @ R femur &   4.1 % in spine   since  2012 Dexa 01/05/16: lowest  - L femur neck T -1.6 Diagnosis: mild Osteopenia Rx: Fosamax remotely; now on Arimidex   . 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 Peripheral neuropathy 08/23/2018  . Plantar fasciitis of right foot    Dr Oneta Rack  . Poor balance 02/20/2018  . S/P bilateral mastectomy 08/04/2015  . Septic shock ([place]) 01/31/2020  . Severe sepsis with  acute organ dysfunction due to gram-negative bacteria ([place]) 01/31/2020  . Transfusion history 1976  . Type 2 diabetes mellitus with stage 3 chronic kidney disease, without long-term current use of insulin ([place]) 08/11/2008   Ophth, Dr Kathrin Penner: no retinopathy  Diabetes maternal grandmother    Past Surgical History:  Procedure Laterality Date  . ABDOMINAL HYSTERECTOMY  1976   with BSO due to infection from [facility] IUD  .  APPENDECTOMY  1976   @ [facility]     [id number], [phone]  . CATARACT EXTRACTION, BILATERAL Bilateral 2018  . COLONOSCOPY W/ POLYPECTOMY  2006   negative 2011; Dr Carlean Purl  . MASTECTOMY W/ NODES PARTIAL  2012   double mastectomy with nodes taken out on right side   . PLACEMENT OF BREAST IMPLANTS  04/2011   Dr Migdalia Dk, [facility]  . TONSILLECTOMY AND ADENOIDECTOMY      Current Medications: Current Meds  Medication Sig  . amLODipine (NORVASC) 5 MG tablet Take 1 tablet (5 mg total) by mouth daily.  Marland Kitchen aspirin 81 MG chewable tablet Chew 1 tablet (81 mg total) by mouth daily.  . carbidopa-levodopa (SINEMET IR) 25-100 MG tablet TAKE 1 TABLET BY MOUTH 3 TIMES A DAY. (Patient taking differently: TAKE 1 TABLET BY MOUTH 2 TIMES A DAY.)  . carvedilol (COREG) 25 MG tablet TAKE 1 TABLET BY MOUTH TWICE DAILY WITH A MEAL.  Marland Kitchen Cyanocobalamin (VITAMIN B-12) 5000 MCG SUBL Place 5,000 mcg under the tongue daily.   . metFORMIN (GLUCOPHAGE) 500 MG tablet TAKE 2 TABLETS TWICE A DAY WITH MEALS.  . Multiple Vitamin (MULTIVITAMIN WITH MINERALS) TABS tablet Take 1 tablet by mouth daily.  . Omega-3 Fatty Acids (FISH OIL CONCENTRATE) 1000 MG CAPS Take 1,000 mg by mouth 2 (two) times daily.      Allergies:   Sulfur, Exemestane, Morphine and related, Statins, Ace inhibitors, and Codeine   Social History   Socioeconomic History  . Marital status: Married    Spouse name: Not on file  . Number of children: 4  . Years of education: Not on file  . Highest education level: Not on file  Occupational History  . Occupation: retired  Tobacco Use  . Smoking status: Former Smoker    Quit date: 09/18/1977    Years since quitting: 42.9  . Smokeless tobacco: Never Used  . Tobacco comment: smoked 1957-1979 , up to 1 ppd  Vaping Use  . Vaping Use: Never used  Substance and Sexual Activity  . Alcohol use: No    Comment:  very rarely  . Drug use: No  . Sexual activity: Not Currently    Birth  control/protection: Surgical  Other Topics Concern  . Not on file  Social History Narrative   Walking for exercise   Social Determinants of Health   Financial Resource Strain:   . Difficulty of Paying Living Expenses: Not on file  Food Insecurity:   . Worried About Charity fundraiser in the Last Year: Not on file  . Ran Out of Food in the Last Year: Not on file  Transportation Needs:   . Lack of Transportation (Medical): Not on file  . Lack of Transportation (Non-Medical): Not on file  Physical Activity:   . Days of Exercise per Week: Not on file  . Minutes of Exercise per Session: Not on file  Stress:   . Feeling of Stress : Not on file  Social Connections:   . Frequency of Communication with Friends  and Family: Not on file  . Frequency of Social Gatherings with Friends and Family: Not on file  . Attends Religious Services: Not on file  . Active Member of Clubs or Organizations: Not on file  . Attends Archivist Meetings: Not on file  . Marital Status: Not on file     Family History: The patient's family history includes Breast cancer (age of onset: 16) in her maternal aunt; Breast cancer (age of onset: 70) in her sister; Breast cancer (age of onset: 46) in her maternal aunt; Diabetes in her maternal grandmother; Heart attack (age of onset: 26) in her father; Heart failure in her sister; Hypertension in her sister; Ovarian cancer in her paternal grandmother; Stroke in her mother; Stroke (age of onset: 68) in her sister. There is no history of Colon cancer, Stomach cancer, Esophageal cancer, Pancreatic cancer, or Liver disease.  ROS:   Please see the history of present illness.     All other systems reviewed and are negative.  EKGs/Labs/Other Studies Reviewed:    The following studies were reviewed today:  ECHO 01/31/20:  1. Left ventricular ejection fraction, by estimation, is 60 to 65%. The  left ventricle has normal function. The left ventricle has no  regional  wall motion abnormalities. Left ventricular diastolic parameters were  normal.  2. Right ventricular systolic function is normal. The right ventricular  size is normal.  3. Left atrial size was mildly dilated.  4. The mitral valve is degenerative. Trivial mitral valve regurgitation.  No evidence of mitral stenosis.  5. The aortic valve is tricuspid. Aortic valve regurgitation is not  visualized. Mild to moderate aortic valve sclerosis/calcification is  present, without any evidence of aortic stenosis.  6. The inferior vena cava is normal in size with greater than 50%  respiratory variability, suggesting right atrial pressure of 3 mmHg.   EKG:  EKG on 02/02/2020 showed sinus rhythm left in a branch block.  Recent Labs: 02/02/2020: TSH 2.102 02/06/2020: B Natriuretic Peptide 302.6; Magnesium 1.5 04/27/2020: ALT 6; BUN 15; Creatinine, Ser 1.02; Hemoglobin 12.4; Platelets 162.0; Potassium 3.9; Sodium 141  Recent Lipid Panel    Component Value Date/Time   CHOL 160 08/27/2019 0910   CHOL 187 08/06/2014 1230   TRIG 184.0 (H) 08/27/2019 0910   TRIG 214 (H) 08/06/2014 1230   TRIG 120 07/03/2006 0818   HDL 38.70 (L) 08/27/2019 0910   HDL 37 (L) 08/06/2014 1230   CHOLHDL 4 08/27/2019 0910   VLDL 36.8 08/27/2019 0910   LDLCALC 85 08/27/2019 0910   LDLCALC 107 (H) 08/06/2014 1230   LDLDIRECT 108.0 08/15/2017 0847         Physical Exam:    VS:  BP 140/70   Pulse 94   Ht $R'5\' 4"'wR$  (1.626 m)   Wt 162 lb (73.5 kg)   SpO2 98%   BMI 27.81 kg/m     Wt Readings from Last 3 Encounters:  08/04/20 162 lb (73.5 kg)  04/27/20 158 lb (71.7 kg)  02/25/20 159 lb (72.1 kg)     GEN: Uses walker well nourished, well developed in no acute distress HEENT: Normal NECK: No JVD; No carotid bruits LYMPHATICS: No lymphadenopathy CARDIAC: RRR, no murmurs, rubs, gallops RESPIRATORY:  Clear to auscultation without rales, wheezing or rhonchi  ABDOMEN: Soft, non-tender,  non-distended MUSCULOSKELETAL:  No edema; No deformity  SKIN: Warm and dry, minor bruising left arm NEUROLOGIC:  Alert and oriented x 3 PSYCHIATRIC:  Normal affect   ASSESSMENT:  1. PAF (paroxysmal atrial fibrillation) ([place])   2. Parkinson disease ([place])   3. Diabetes mellitus with coincident hypertension (H[facility])    PLAN:    In order of problems listed above:  Paroxysmal atrial fibrillation with LBBB -As a stated above, this was in the setting of urosepsis, could've been a reversible cause. No further atrial fibrillation evident on event monitor. Of course if this returns, she warrants anticoagulation given her CHA2DS2-VASc score of at least 4. -She has been doing very well no palpitations once again.  Sinus rhythm.  Parkinson's disease -Continue with carbidopa levodopa per primary team. Falling. Right leg weaker.  Creatinine 1.02 potassium 3.9 LDL 85  Diabetes with essential hypertension -Blood pressure has been low at times. At last office visit on 02/19/2020.  Currently on amlodipine 5 mg and continued with carvedilol 25 mg twice a day. Previously on ARB prior to sepsis as well. -Hemoglobin A1c 6.5.  On Metformin.  No changes made.  2-year follow-up sounds reasonable.  She will let me know if any further symptoms occur.   Shared Decision Making/Informed Consent        Medication Adjustments/Labs and Tests Ordered: Current medicines are reviewed at length with the patient today.  Concerns regarding medicines are outlined above.  No orders of the defined types were placed in this encounter.  No orders of the defined types were placed in this encounter.   Patient Instructions  Medication Instructions:   Your physician recommends that you continue on your current medications as directed. Please refer to the Current Medication list given to you today.  *If you need a refill on your cardiac medications before your next appointment, please call your pharmacy*  Follow-Up: At  [facility], you and your health needs are our priority.  As part of our continuing mission to provide you with exceptional heart care, we have created designated Provider Care Teams.  These Care Teams include your primary Cardiologist (physician) and Advanced Practice Providers (APPs -  Physician Assistants and Nurse Practitioners) who all work together to provide you with the care you need, when you need it.  We recommend signing up for the patient portal called "MyChart".  Sign up information is provided on this After Visit Summary.  MyChart is used to connect with patients for Virtual Visits (Telemedicine).  Patients are able to view lab/test results, encounter notes, upcoming appointments, etc.  Non-urgent messages can be sent to your provider as well.   To learn more about what you can do with MyChart, go to [email or web address].    Your next appointment:   2 year(s)  The format for your next appointment:   In Person  Provider:   Candee Furbish, MD        Signed, Candee Furbish, MD  08/04/2020 11:53 AM    [place]

## 2020-08-04 ENCOUNTER — Other Ambulatory Visit: Payer: Self-pay

## 2020-08-04 ENCOUNTER — Encounter: Payer: Self-pay | Admitting: Cardiology

## 2020-08-04 ENCOUNTER — Ambulatory Visit: Payer: Medicare Other | Admitting: Cardiology

## 2020-08-04 VITALS — BP 140/70 | HR 94 | Ht 64.0 in | Wt 162.0 lb

## 2020-08-04 DIAGNOSIS — I480 Paroxysmal atrial fibrillation: Secondary | ICD-10-CM

## 2020-08-04 DIAGNOSIS — I10 Essential (primary) hypertension: Secondary | ICD-10-CM | POA: Diagnosis not present

## 2020-08-04 DIAGNOSIS — G20 Parkinson's disease: Secondary | ICD-10-CM

## 2020-08-04 DIAGNOSIS — E119 Type 2 diabetes mellitus without complications: Secondary | ICD-10-CM

## 2020-08-04 NOTE — Patient Instructions (Signed)
Medication Instructions:   Your physician recommends that you continue on your current medications as directed. Please refer to the Current Medication list given to you today.  *If you need a refill on your cardiac medications before your next appointment, please call your pharmacy*  Follow-Up: At [facility], you and your health needs are our priority.  As part of our continuing mission to provide you with exceptional heart care, we have created designated Provider Care Teams.  These Care Teams include your primary Cardiologist (physician) and Advanced Practice Providers (APPs -  Physician Assistants and Nurse Practitioners) who all work together to provide you with the care you need, when you need it.  We recommend signing up for the patient portal called "MyChart".  Sign up information is provided on this After Visit Summary.  MyChart is used to connect with patients for Virtual Visits (Telemedicine).  Patients are able to view lab/test results, encounter notes, upcoming appointments, etc.  Non-urgent messages can be sent to your provider as well.   To learn more about what you can do with MyChart, go to [email or web address].    Your next appointment:   2 year(s)  The format for your next appointment:   In Person  Provider:   Candee Furbish, MD

## 2020-08-06 ENCOUNTER — Other Ambulatory Visit: Payer: Self-pay | Admitting: Neurology

## 2020-08-06 DIAGNOSIS — R259 Unspecified abnormal involuntary movements: Secondary | ICD-10-CM | POA: Diagnosis not present

## 2020-08-06 DIAGNOSIS — G20 Parkinson's disease: Secondary | ICD-10-CM | POA: Diagnosis not present

## 2020-08-11 DIAGNOSIS — R259 Unspecified abnormal involuntary movements: Secondary | ICD-10-CM | POA: Diagnosis not present

## 2020-08-11 DIAGNOSIS — G20 Parkinson's disease: Secondary | ICD-10-CM | POA: Diagnosis not present

## 2020-08-17 DIAGNOSIS — G20 Parkinson's disease: Secondary | ICD-10-CM | POA: Diagnosis not present

## 2020-08-17 DIAGNOSIS — R259 Unspecified abnormal involuntary movements: Secondary | ICD-10-CM | POA: Diagnosis not present

## 2020-08-20 DIAGNOSIS — G20 Parkinson's disease: Secondary | ICD-10-CM | POA: Diagnosis not present

## 2020-08-20 DIAGNOSIS — R259 Unspecified abnormal involuntary movements: Secondary | ICD-10-CM | POA: Diagnosis not present

## 2020-08-25 DIAGNOSIS — G20 Parkinson's disease: Secondary | ICD-10-CM | POA: Diagnosis not present

## 2020-08-25 DIAGNOSIS — R259 Unspecified abnormal involuntary movements: Secondary | ICD-10-CM | POA: Diagnosis not present

## 2020-08-26 ENCOUNTER — Ambulatory Visit: Payer: Medicare Other | Admitting: Internal Medicine

## 2020-08-26 DIAGNOSIS — G20 Parkinson's disease: Secondary | ICD-10-CM | POA: Insufficient documentation

## 2020-08-26 NOTE — Progress Notes (Signed)
Subjective:    Patient ID: [name], female    DOB: Mar 18, 1941, 79 y.o.   MRN: [id number]  HPI The patient is here for follow up of their chronic medical problems, including DM, htn, hypertriglyceridemia, CKD, parkinson's dz  She is exercising.    Eye exam up to date.  She is taking all of her medications as prescribed.  She has no concerns.   Medications and allergies reviewed with patient and updated if appropriate.  Patient Active Problem List   Diagnosis Date Noted  . Aortic atherosclerosis ([place]) 08/27/2020  . 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 Parkinson disease ([place]) 08/26/2020  . Paroxysmal atrial fibrillation ([place]) 02/25/2020  . Complicated UTI (urinary tract infection) 01/31/2020  . CKD (chronic kidney disease) stage 3, GFR 30-59 ml/min (HCC) 01/31/2020  . Hypokalemia due to inadequate potassium intake 01/31/2020  . Severe sepsis with acute organ dysfunction due to Gram negative bacteria ([place]) 01/31/2020  . Episodic lightheadedness 08/27/2019  . Dizziness 02/24/2019  . Peripheral neuropathy 08/23/2018  . Poor balance 02/20/2018  . Genetic testing 09/04/2017  . Family history of breast cancer   . Family history of ovarian cancer   . Fatty liver 08/05/2015  . History of breast cancer 08/04/2015  . S/P bilateral mastectomy 08/04/2015  . Osteoarthritis of left knee 08/04/2015  . Elevated LFTs 08/10/2014  . Osteopenia 08/31/2013  . Transfusion history 08/18/2013  . IBS (irritable bowel syndrome) 08/18/2013  . 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 Joint pain 09/02/2010  . History of colonic polyps 06/06/2010  . Essential hypertension 02/16/2009  . Type 2 diabetes mellitus with stage 3 chronic kidney disease, without long-term current use of insulin ([place]) 08/11/2008  . Hypertriglyceridemia 09/30/2007  . GILBERT'S SYNDROME 02/06/2007    Current Outpatient Medications on File Prior to Visit  Medication Sig Dispense Refill   . amLODipine (NORVASC) 5 MG tablet Take 1 tablet (5 mg total) by mouth daily. 30 tablet 2  . aspirin 81 MG chewable tablet Chew 1 tablet (81 mg total) by mouth daily. 30 tablet 0  . carbidopa-levodopa (SINEMET IR) 25-100 MG tablet TAKE 1 TABLET BY MOUTH 3 TIMES A DAY. (Patient taking differently: TAKE 1 TABLET BY MOUTH 2 TIMES A DAY.) 90 tablet 5  . carvedilol (COREG) 25 MG tablet TAKE 1 TABLET BY MOUTH TWICE DAILY WITH A MEAL. 60 tablet 5  . Cyanocobalamin (VITAMIN B-12) 5000 MCG SUBL Place 5,000 mcg under the tongue daily.     . metFORMIN (GLUCOPHAGE) 500 MG tablet TAKE 2 TABLETS TWICE A DAY WITH MEALS. 360 tablet 2  . Multiple Vitamin (MULTIVITAMIN WITH MINERALS) TABS tablet Take 1 tablet by mouth daily.    . Omega-3 Fatty Acids (FISH OIL CONCENTRATE) 1000 MG CAPS Take 1,000 mg by mouth 2 (two) times daily.     No current facility-administered medications on file prior to visit.    Past Medical History:  Diagnosis Date  . Acute renal failure superimposed on stage 3 chronic kidney disease ([place]) 01/31/2020  . Breast cancer ([place]) 2012  . 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 She underwent right total mastectomy with sentinel node biopsy and left total (prophylactic) mastectomy on 10/12/2010. She underwent immediate reconstruction with expander and AlloDerm placement. 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 One lymph  .  Complicated UTI (urinary tract infection) 01/31/2020  . Diabetes mellitus   . Dizziness 02/24/2019  . Elevated LFTs 08/10/2014  . Episodic lightheadedness 08/27/2019  . Esophageal reflux 05/06/2008  . Essential hypertension 02/16/2009  . Family history of breast cancer   . Family history of ovarian cancer   . Fatigue 02/24/2019  . Fatty liver 08/05/2015  . Genetic testing 09/04/2017   Negative  genetic testing on the common hereditary cancer panel.  The Hereditary Gene Panel offered by [facility] includes sequencing and/or deletion duplication testing of the following 47 genes: APC, ATM, AXIN2, BARD1, BMPR1A, BRCA1, BRCA2, BRIP1, CDH1, CDK4, CDKN2A (p14ARF), CDKN2A (p16INK4a), CHEK2, CTNNA1, DICER1, EPCAM (Deletion/duplication testing only), GREM1 (promoter region deletion/duplicat  . GERD (gastroesophageal reflux disease)   . Gilbert syndrome   . GILBERT'S SYNDROME 02/06/2007   Qualifier: Diagnosis of  By: Janelle Floor    . History of breast cancer 08/04/2015   S/p b/l mastectomy  . History of colonic polyps 06/06/2010   Annotation: destroyed, no clear adenomatous proven Qualifier: Diagnosis of  By: Carlean Purl MD, Tonna Boehringer E  2008 polyp 2013 neg    . Hypertension   . Hypertriglyceridemia 09/30/2007   statin intolerant  Father MI @ 5 Sister CVA > 23   . Hypokalemia due to inadequate potassium intake 01/31/2020  . IBS (irritable bowel syndrome)   . Joint pain 09/02/2010  . Lactic acidosis 01/31/2020  . Osteoarthritis of left knee 08/04/2015   Significant arthritis in knees, limits her activity   . Osteopenia 08/31/2013   [facility]  DEXA 03/20/2018: Osteopenia:  LFN -1.3, R--1.0, spine -0.6-statistically significant increase in BMD in all areas  Findings 08/22/13 : lowest T score -  1.3  @  R femoral neck (hip) ,3.4  %loss @ R femur &   4.1 % in spine   since  2012 Dexa 01/05/16: lowest  - L femur neck T -1.6 Diagnosis: mild Osteopenia Rx: Fosamax remotely; now on Arimidex   . 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 Peripheral neuropathy 08/23/2018  . Plantar fasciitis of right foot    Dr Oneta Rack  . Poor balance 02/20/2018  . S/P bilateral mastectomy 08/04/2015  . Septic shock ([place]) 01/31/2020  . Severe sepsis with acute organ dysfunction due to gram-negative bacteria ([place]) 01/31/2020  . Transfusion history 1976  . Type 2 diabetes mellitus with stage 3 chronic kidney disease, without long-term current  use of insulin ([place]) 08/11/2008   Ophth, Dr Kathrin Penner: no retinopathy  Diabetes maternal grandmother    Past Surgical History:  Procedure Laterality Date  . ABDOMINAL HYSTERECTOMY  1976   with BSO due to infection from [facility] IUD  . APPENDECTOMY  1976   @ [place]     [id number], [phone]  . CATARACT EXTRACTION, BILATERAL Bilateral 2018  . COLONOSCOPY W/ POLYPECTOMY  2006   negative 2011; Dr Carlean Purl  . MASTECTOMY W/ NODES PARTIAL  2012   double mastectomy with nodes taken out on right side   . PLACEMENT OF BREAST IMPLANTS  04/2011   Dr Migdalia Dk, [facility]  . TONSILLECTOMY AND ADENOIDECTOMY      Social History   Socioeconomic History  . Marital status: Married    Spouse name: Not on file  . Number of children: 4  . Years of education: Not on file  . Highest education level: Not on file  Occupational History  . Occupation: retired  Tobacco Use  . Smoking status: Former Smoker    Quit date:  09/18/1977    Years since quitting: 42.9  . Smokeless tobacco: Never Used  . Tobacco comment: smoked 1957-1979 , up to 1 ppd  Vaping Use  . Vaping Use: Never used  Substance and Sexual Activity  . Alcohol use: No    Comment:  very rarely  . Drug use: No  . Sexual activity: Not Currently    Birth control/protection: Surgical  Other Topics Concern  . Not on file  Social History Narrative   Walking for exercise   Social Determinants of Health   Financial Resource Strain: Low Risk   . Difficulty of Paying Living Expenses: Not hard at all  Food Insecurity: No Food Insecurity  . Worried About Charity fundraiser in the Last Year: Never true  . Ran Out of Food in the Last Year: Never true  Transportation Needs: No Transportation Needs  . Lack of Transportation (Medical): No  . Lack of Transportation (Non-Medical): No  Physical Activity: Sufficiently Active  . Days of Exercise per Week: 5 days  . Minutes of Exercise per Session: 30 min  Stress:  No Stress Concern Present  . Feeling of Stress : Not at all  Social Connections: Socially Integrated  . Frequency of Communication with Friends and Family: More than three times a week  . Frequency of Social Gatherings with Friends and Family: Once a week  . Attends Religious Services: More than 4 times per year  . Active Member of Clubs or Organizations: No  . Attends Archivist Meetings: More than 4 times per year  . Marital Status: Married    Family History  Problem Relation Age of Onset  . Heart attack Father 44  . Breast cancer Sister 29        bilateral   . Heart failure Sister        PMH intensive chemotherapy  . Hypertension Sister   . Stroke Sister 68  . Stroke Mother        TMI  . Breast cancer Maternal Aunt 51  . Diabetes Maternal Grandmother   . Ovarian cancer Paternal Grandmother   . Breast cancer Maternal Aunt 77  . Colon cancer Neg Hx   . Stomach cancer Neg Hx   . Esophageal cancer Neg Hx   . Pancreatic cancer Neg Hx   . Liver disease Neg Hx     Review of Systems  Constitutional: Negative for chills and fever.  Respiratory: Negative for cough, shortness of breath and wheezing.   Cardiovascular: Negative for chest pain, palpitations and leg swelling.  Neurological: Negative for light-headedness and headaches.       Objective:   Vitals:   08/27/20 1345  BP: 128/80  Pulse: (!) 54  Temp: 98.3 F (36.8 C)  SpO2: 98%   BP Readings from Last 3 Encounters:  08/27/20 128/80  08/27/20 128/80  08/04/20 140/70   Wt Readings from Last 3 Encounters:  08/27/20 163 lb (73.9 kg)  08/27/20 163 lb 9.6 oz (74.2 kg)  08/04/20 162 lb (73.5 kg)   Body mass index is 27.98 kg/m.   Physical Exam    Constitutional: Appears well-developed and well-nourished. No distress.  HENT:  Head: Normocephalic and atraumatic.  Neck: Neck supple. No tracheal deviation present. No thyromegaly present.  No cervical lymphadenopathy Cardiovascular: Normal rate,  regular rhythm and normal heart sounds.   No murmur heard. No carotid bruit .  No edema Pulmonary/Chest: Effort normal and breath sounds normal. No respiratory distress. No has no wheezes.  No rales.  Skin: Skin is warm and dry. Not diaphoretic.  Psychiatric: Normal mood and affect. Behavior is normal.      Assessment & Plan:    See Problem List for Assessment and Plan of chronic medical problems.    This visit occurred during the SARS-CoV-2 public health emergency.  Safety protocols were in place, including screening questions prior to the visit, additional usage of staff PPE, and extensive cleaning of exam room while observing appropriate contact time as indicated for disinfecting solutions.

## 2020-08-26 NOTE — Patient Instructions (Addendum)
    Blood work was ordered.      Medications changes include :   none     Please followup in 6 months  

## 2020-08-27 ENCOUNTER — Ambulatory Visit (INDEPENDENT_AMBULATORY_CARE_PROVIDER_SITE_OTHER): Payer: Medicare Other

## 2020-08-27 ENCOUNTER — Other Ambulatory Visit: Payer: Self-pay

## 2020-08-27 ENCOUNTER — Encounter: Payer: Self-pay | Admitting: Internal Medicine

## 2020-08-27 ENCOUNTER — Ambulatory Visit (INDEPENDENT_AMBULATORY_CARE_PROVIDER_SITE_OTHER): Payer: Medicare Other | Admitting: Internal Medicine

## 2020-08-27 VITALS — BP 128/80 | HR 54 | Temp 98.3°F | Ht 64.0 in | Wt 163.0 lb

## 2020-08-27 VITALS — BP 128/80 | HR 73 | Temp 98.3°F | Ht 64.0 in | Wt 163.6 lb

## 2020-08-27 DIAGNOSIS — I480 Paroxysmal atrial fibrillation: Secondary | ICD-10-CM | POA: Diagnosis not present

## 2020-08-27 DIAGNOSIS — Z0000 Encounter for general adult medical examination without abnormal findings: Secondary | ICD-10-CM

## 2020-08-27 DIAGNOSIS — N1831 Chronic kidney disease, stage 3a: Secondary | ICD-10-CM

## 2020-08-27 DIAGNOSIS — G20A1 Parkinson's disease without dyskinesia, without mention of fluctuations: Secondary | ICD-10-CM

## 2020-08-27 DIAGNOSIS — T466X5A Adverse effect of antihyperlipidemic and antiarteriosclerotic drugs, initial encounter: Secondary | ICD-10-CM | POA: Insufficient documentation

## 2020-08-27 DIAGNOSIS — G20 Parkinson's disease: Secondary | ICD-10-CM

## 2020-08-27 DIAGNOSIS — E781 Pure hyperglyceridemia: Secondary | ICD-10-CM | POA: Diagnosis not present

## 2020-08-27 DIAGNOSIS — E1122 Type 2 diabetes mellitus with diabetic chronic kidney disease: Secondary | ICD-10-CM | POA: Diagnosis not present

## 2020-08-27 DIAGNOSIS — I700 Atherosclerosis of aorta: Secondary | ICD-10-CM

## 2020-08-27 DIAGNOSIS — K719 Toxic liver disease, unspecified: Secondary | ICD-10-CM

## 2020-08-27 DIAGNOSIS — I10 Essential (primary) hypertension: Secondary | ICD-10-CM

## 2020-08-27 LAB — HEMOGLOBIN A1C: Hgb A1c MFr Bld: 5.8 % (ref 4.6–6.5)

## 2020-08-27 LAB — COMPREHENSIVE METABOLIC PANEL
ALT: 24 U/L (ref 0–35)
AST: 24 U/L (ref 0–37)
Albumin: 4.2 g/dL (ref 3.5–5.2)
Alkaline Phosphatase: 48 U/L (ref 39–117)
BUN: 19 mg/dL (ref 6–23)
CO2: 29 mEq/L (ref 19–32)
Calcium: 9.4 mg/dL (ref 8.4–10.5)
Chloride: 104 mEq/L (ref 96–112)
Creatinine, Ser: 1 mg/dL (ref 0.40–1.20)
GFR: 53.81 mL/min — ABNORMAL LOW (ref >60.00)
Glucose, Bld: 109 mg/dL — ABNORMAL HIGH (ref 70–99)
Potassium: 3.8 mEq/L (ref 3.5–5.1)
Sodium: 140 mEq/L (ref 135–145)
Total Bilirubin: 1.4 mg/dL — ABNORMAL HIGH (ref 0.2–1.2)
Total Protein: 7.2 g/dL (ref 6.0–8.3)

## 2020-08-27 LAB — CBC WITH DIFFERENTIAL/PLATELET
Basophils Absolute: 0 10*3/uL (ref 0.0–0.1)
Basophils Relative: 0.6 % (ref 0.0–3.0)
Eosinophils Absolute: 0.1 10*3/uL (ref 0.0–0.7)
Eosinophils Relative: 2 % (ref 0.0–5.0)
HCT: 38.2 % (ref 36.0–46.0)
Hemoglobin: 12.8 g/dL (ref 12.0–15.0)
Lymphocytes Relative: 31.4 % (ref 12.0–46.0)
Lymphs Abs: 1.7 10*3/uL (ref 0.7–4.0)
MCHC: 33.4 g/dL (ref 30.0–36.0)
MCV: 85.9 fl (ref 78.0–100.0)
Monocytes Absolute: 0.5 10*3/uL (ref 0.1–1.0)
Monocytes Relative: 9.3 % (ref 3.0–12.0)
Neutro Abs: 3.1 10*3/uL (ref 1.4–7.7)
Neutrophils Relative %: 56.7 % (ref 43.0–77.0)
Platelets: 174 10*3/uL (ref 150.0–400.0)
RBC: 4.44 Mil/uL (ref 3.87–5.11)
RDW: 15.4 % (ref 11.5–15.5)
WBC: 5.4 10*3/uL (ref 4.0–10.5)

## 2020-08-27 LAB — LIPID PANEL
Cholesterol: 153 mg/dL (ref 0–200)
HDL: 41.9 mg/dL (ref >39.00)
LDL Cholesterol: 77 mg/dL (ref 0–99)
NonHDL: 111.35
Total CHOL/HDL Ratio: 4
Triglycerides: 171 mg/dL — ABNORMAL HIGH (ref 0.0–149.0)
VLDL: 34.2 mg/dL (ref 0.0–40.0)

## 2020-08-27 LAB — MICROALBUMIN / CREATININE URINE RATIO
Creatinine,U: 98.1 mg/dL
Microalb Creat Ratio: 1.1 mg/g (ref 0.0–30.0)
Microalb, Ur: 1.1 mg/dL (ref 0.0–1.9)

## 2020-08-27 NOTE — Assessment & Plan Note (Signed)
H/o elevated LFTS Secondary to statin

## 2020-08-27 NOTE — Assessment & Plan Note (Signed)
Chronic cmp 

## 2020-08-27 NOTE — Assessment & Plan Note (Signed)
Chronic Asymptomatic Sees cardio On ASA 81 mg, coreg 25 mg BID Cbc, cmp

## 2020-08-27 NOTE — Assessment & Plan Note (Signed)
Chronic BP well controlled Continue amlodipine 5 mg daily, coreg 25 mg BID cmp

## 2020-08-27 NOTE — Assessment & Plan Note (Signed)
Chronic Continue metformin 1000 mg BIDAC Check a1c Low sugar / carb diet Stressed regular exercise

## 2020-08-27 NOTE — Assessment & Plan Note (Signed)
Chronic management per Dr Tomi Likens Stressed continuing regular exercise

## 2020-08-27 NOTE — Assessment & Plan Note (Signed)
Chronic Lipid panel Continue regular exercise

## 2020-08-27 NOTE — Assessment & Plan Note (Signed)
Chronic Check lipid panel  Did not tolerate statins - lfts Regular exercise and healthy diet encouraged

## 2020-08-27 NOTE — Patient Instructions (Signed)
Ms. Biskup , Thank you for taking time to come for your Medicare Wellness Visit. I appreciate your ongoing commitment to your health goals. Please review the following plan we discussed and let me know if I can assist you in the future.   Screening recommendations/referrals: Colonoscopy: 06/23/2010; no repeat due to age 79: not a candidate for mammograms Bone Density: 03/20/2018; due every 2 years Recommended yearly ophthalmology/optometry visit for glaucoma screening and checkup Recommended yearly dental visit for hygiene and checkup  Vaccinations: Influenza vaccine: 07/17/2020 Pneumococcal vaccine: up to date Tdap vaccine: 02/26/2014; due every 10 years Shingles vaccine: never done; will check with local pharmacy   Covid-19: up to date with booster  Advanced directives: Please bring a copy of your health care power of attorney and living will to the office at your convenience.  Conditions/risks identified: Yes; Reviewed health maintenance screenings with patient today and relevant education, vaccines, and/or referrals were provided. Please continue to do your personal lifestyle choices by: daily care of teeth and gums, regular physical activity (goal should be 5 days a week for 30 minutes), eat a healthy diet, avoid tobacco and drug use, limiting any alcohol intake, taking a low-dose aspirin (if not allergic or have been advised by your provider otherwise) and taking vitamins and minerals as recommended by your provider. Continue doing brain stimulating activities (puzzles, reading, adult coloring books, staying active) to keep memory sharp. Continue to eat heart healthy diet (full of fruits, vegetables, whole grains, lean protein, water--limit salt, fat, and sugar intake) and increase physical activity as tolerated.  Next appointment: Please schedule your next Medicare Wellness Visit with your Nurse Health Advisor in 1 year by calling [phone].   Preventive Care 35 Years and  Older, Female Preventive care refers to lifestyle choices and visits with your health care provider that can promote health and wellness. What does preventive care include?  A yearly physical exam. This is also called an annual well check.  Dental exams once or twice a year.  Routine eye exams. Ask your health care provider how often you should have your eyes checked.  Personal lifestyle choices, including:  Daily care of your teeth and gums.  Regular physical activity.  Eating a healthy diet.  Avoiding tobacco and drug use.  Limiting alcohol use.  Practicing safe sex.  Taking low-dose aspirin every day.  Taking vitamin and mineral supplements as recommended by your health care provider. What happens during an annual well check? The services and screenings done by your health care provider during your annual well check will depend on your age, overall health, lifestyle risk factors, and family history of disease. Counseling  Your health care provider may ask you questions about your:  Alcohol use.  Tobacco use.  Drug use.  Emotional well-being.  Home and relationship well-being.  Sexual activity.  Eating habits.  History of falls.  Memory and ability to understand (cognition).  Work and work Statistician.  Reproductive health. Screening  You may have the following tests or measurements:  Height, weight, and BMI.  Blood pressure.  Lipid and cholesterol levels. These may be checked every 5 years, or more frequently if you are over 62 years old.  Skin check.  Lung cancer screening. You may have this screening every year starting at age 47 if you have a 30-pack-year history of smoking and currently smoke or have quit within the past 15 years.  Fecal occult blood test (FOBT) of the stool. You may have this test every year  starting at age 59.  Flexible sigmoidoscopy or colonoscopy. You may have a sigmoidoscopy every 5 years or a colonoscopy every 10 years  starting at age 9.  Hepatitis C blood test.  Hepatitis B blood test.  Sexually transmitted disease (STD) testing.  Diabetes screening. This is done by checking your blood sugar (glucose) after you have not eaten for a while (fasting). You may have this done every 1-3 years.  Bone density scan. This is done to screen for osteoporosis. You may have this done starting at age 61.  Mammogram. This may be done every 1-2 years. Talk to your health care provider about how often you should have regular mammograms. Talk with your health care provider about your test results, treatment options, and if necessary, the need for more tests. Vaccines  Your health care provider may recommend certain vaccines, such as:  Influenza vaccine. This is recommended every year.  Tetanus, diphtheria, and acellular pertussis (Tdap, Td) vaccine. You may need a Td booster every 10 years.  Zoster vaccine. You may need this after age 30.  Pneumococcal 13-valent conjugate (PCV13) vaccine. One dose is recommended after age 60.  Pneumococcal polysaccharide (PPSV23) vaccine. One dose is recommended after age 30. Talk to your health care provider about which screenings and vaccines you need and how often you need them. This information is not intended to replace advice given to you by your health care provider. Make sure you discuss any questions you have with your health care provider. Document Released: 10/01/2015 Document Revised: 05/24/2016 Document Reviewed: 07/06/2015 Elsevier Interactive Patient Education  2017 [place] Prevention in the Home Falls can cause injuries. They can happen to people of all ages. There are many things you can do to make your home safe and to help prevent falls. What can I do on the outside of my home?  Regularly fix the edges of walkways and driveways and fix any cracks.  Remove anything that might make you trip as you walk through a door, such as a raised step or  threshold.  Trim any bushes or trees on the path to your home.  Use bright outdoor lighting.  Clear any walking paths of anything that might make someone trip, such as rocks or tools.  Regularly check to see if handrails are loose or broken. Make sure that both sides of any steps have handrails.  Any raised decks and porches should have guardrails on the edges.  Have any leaves, snow, or ice cleared regularly.  Use sand or salt on walking paths during winter.  Clean up any spills in your garage right away. This includes oil or grease spills. What can I do in the bathroom?  Use night lights.  Install grab bars by the toilet and in the tub and shower. Do not use towel bars as grab bars.  Use non-skid mats or decals in the tub or shower.  If you need to sit down in the shower, use a plastic, non-slip stool.  Keep the floor dry. Clean up any water that spills on the floor as soon as it happens.  Remove soap buildup in the tub or shower regularly.  Attach bath mats securely with double-sided non-slip rug tape.  Do not have throw rugs and other things on the floor that can make you trip. What can I do in the bedroom?  Use night lights.  Make sure that you have a light by your bed that is easy to reach.  Do not  use any sheets or blankets that are too big for your bed. They should not hang down onto the floor.  Have a firm chair that has side arms. You can use this for support while you get dressed.  Do not have throw rugs and other things on the floor that can make you trip. What can I do in the kitchen?  Clean up any spills right away.  Avoid walking on wet floors.  Keep items that you use a lot in easy-to-reach places.  If you need to reach something above you, use a strong step stool that has a grab bar.  Keep electrical cords out of the way.  Do not use floor polish or wax that makes floors slippery. If you must use wax, use non-skid floor wax.  Do not have  throw rugs and other things on the floor that can make you trip. What can I do with my stairs?  Do not leave any items on the stairs.  Make sure that there are handrails on both sides of the stairs and use them. Fix handrails that are broken or loose. Make sure that handrails are as long as the stairways.  Check any carpeting to make sure that it is firmly attached to the stairs. Fix any carpet that is loose or worn.  Avoid having throw rugs at the top or bottom of the stairs. If you do have throw rugs, attach them to the floor with carpet tape.  Make sure that you have a light switch at the top of the stairs and the bottom of the stairs. If you do not have them, ask someone to add them for you. What else can I do to help prevent falls?  Wear shoes that:  Do not have high heels.  Have rubber bottoms.  Are comfortable and fit you well.  Are closed at the toe. Do not wear sandals.  If you use a stepladder:  Make sure that it is fully opened. Do not climb a closed stepladder.  Make sure that both sides of the stepladder are locked into place.  Ask someone to hold it for you, if possible.  Clearly mark and make sure that you can see:  Any grab bars or handrails.  First and last steps.  Where the edge of each step is.  Use tools that help you move around (mobility aids) if they are needed. These include:  Canes.  Walkers.  Scooters.  Crutches.  Turn on the lights when you go into a dark area. Replace any light bulbs as soon as they burn out.  Set up your furniture so you have a clear path. Avoid moving your furniture around.  If any of your floors are uneven, fix them.  If there are any pets around you, be aware of where they are.  Review your medicines with your doctor. Some medicines can make you feel dizzy. This can increase your chance of falling. Ask your doctor what other things that you can do to help prevent falls. This information is not intended to  replace advice given to you by your health care provider. Make sure you discuss any questions you have with your health care provider. Document Released: 07/01/2009 Document Revised: 02/10/2016 Document Reviewed: 10/09/2014 Elsevier Interactive Patient Education  2017 [employer].

## 2020-08-27 NOTE — Progress Notes (Signed)
Subjective:   [name] is a 79 y.o. female who presents for Medicare Annual (Subsequent) preventive examination.  Review of Systems    No ROS. Medicare Wellness Visit. Additional risk factors are reflected in social history. Cardiac Risk Factors include: advanced age (>82men, >21 women);diabetes mellitus;family history of premature cardiovascular disease;hypertension     Objective:    Today's Vitals   08/27/20 1311  BP: 128/80  Pulse: 73  Temp: 98.3 F (36.8 C)  SpO2: 98%  Weight: 163 lb 9.6 oz (74.2 kg)  Height: $Remove'5\' 4"'cEkHjhz$  (1.626 m)  PainSc: 0-No pain   Body mass index is 28.08 kg/m.  Advanced Directives 08/27/2020 01/31/2020 04/01/2019 02/20/2018 01/07/2018 08/14/2016 08/16/2015  Does Patient Have a Medical Advance Directive? Yes No Yes Yes Yes No No  Type of Advance Directive Living will;Healthcare Power of [place];Living will [place];Living will [place];Living will - -  Does patient want to make changes to medical advance directive? No - Patient declined - - - - - -  Copy of [place] in Chart? No - copy requested - No - copy requested No - copy requested - - -  Would patient like information on creating a medical advance directive? - No - Patient declined - - - - -    Current Medications (verified) Outpatient Encounter Medications as of 08/27/2020  Medication Sig  . amLODipine (NORVASC) 5 MG tablet Take 1 tablet (5 mg total) by mouth daily.  Marland Kitchen aspirin 81 MG chewable tablet Chew 1 tablet (81 mg total) by mouth daily.  . carbidopa-levodopa (SINEMET IR) 25-100 MG tablet TAKE 1 TABLET BY MOUTH 3 TIMES A DAY. (Patient taking differently: TAKE 1 TABLET BY MOUTH 2 TIMES A DAY.)  . carvedilol (COREG) 25 MG tablet TAKE 1 TABLET BY MOUTH TWICE DAILY WITH A MEAL.  Marland Kitchen Cyanocobalamin (VITAMIN B-12) 5000 MCG SUBL Place 5,000 mcg under the tongue daily.   . metFORMIN (GLUCOPHAGE) 500 MG  tablet TAKE 2 TABLETS TWICE A DAY WITH MEALS.  . Multiple Vitamin (MULTIVITAMIN WITH MINERALS) TABS tablet Take 1 tablet by mouth daily.  . Omega-3 Fatty Acids (FISH OIL CONCENTRATE) 1000 MG CAPS Take 1,000 mg by mouth 2 (two) times daily.    No facility-administered encounter medications on file as of 08/27/2020.    Allergies (verified) Sulfur, Exemestane, Morphine and related, Statins, Ace inhibitors, and Codeine   History: Past Medical History:  Diagnosis Date  . Acute renal failure superimposed on stage 3 chronic kidney disease ([place]) 01/31/2020  . Breast cancer ([place]) 2012  . 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 She underwent right total mastectomy with sentinel node biopsy and left total (prophylactic) mastectomy on 10/12/2010. She underwent immediate reconstruction with expander and AlloDerm placement. 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 One lymph  . Complicated UTI (urinary tract infection) 01/31/2020  . Diabetes mellitus   . Dizziness 02/24/2019  . Elevated LFTs 08/10/2014  . Episodic lightheadedness 08/27/2019  . Esophageal reflux 05/06/2008  . Essential hypertension 02/16/2009  . Family history of breast cancer   . Family history of ovarian cancer   . Fatigue 02/24/2019  . Fatty liver 08/05/2015  . Genetic testing 09/04/2017   Negative genetic testing on the common hereditary cancer panel.  The Hereditary Gene  Panel offered by [facility] includes sequencing and/or deletion duplication testing of the following 47 genes: APC, ATM, AXIN2, BARD1, BMPR1A, BRCA1, BRCA2, BRIP1, CDH1, CDK4, CDKN2A (p14ARF), CDKN2A (p16INK4a), CHEK2, CTNNA1, DICER1, EPCAM (Deletion/duplication testing only), GREM1 (promoter region deletion/duplicat  . GERD (gastroesophageal reflux disease)   . Gilbert syndrome   . GILBERT'S  SYNDROME 02/06/2007   Qualifier: Diagnosis of  By: Janelle Floor    . History of breast cancer 08/04/2015   S/p b/l mastectomy  . History of colonic polyps 06/06/2010   Annotation: destroyed, no clear adenomatous proven Qualifier: Diagnosis of  By: Carlean Purl MD, Tonna Boehringer E  2008 polyp 2013 neg    . Hypertension   . Hypertriglyceridemia 09/30/2007   statin intolerant  Father MI @ 48 Sister CVA > 77   . Hypokalemia due to inadequate potassium intake 01/31/2020  . IBS (irritable bowel syndrome)   . Joint pain 09/02/2010  . Lactic acidosis 01/31/2020  . Osteoarthritis of left knee 08/04/2015   Significant arthritis in knees, limits her activity   . Osteopenia 08/31/2013   [facility]  DEXA 03/20/2018: Osteopenia:  LFN -1.3, R--1.0, spine -0.6-statistically significant increase in BMD in all areas  Findings 08/22/13 : lowest T score -  1.3  @  R femoral neck (hip) ,3.4  %loss @ R femur &   4.1 % in spine   since  2012 Dexa 01/05/16: lowest  - L femur neck T -1.6 Diagnosis: mild Osteopenia Rx: Fosamax remotely; now on Arimidex   . 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 Peripheral neuropathy 08/23/2018  . Plantar fasciitis of right foot    Dr Oneta Rack  . Poor balance 02/20/2018  . S/P bilateral mastectomy 08/04/2015  . Septic shock ([place]) 01/31/2020  . Severe sepsis with acute organ dysfunction due to gram-negative bacteria ([place]) 01/31/2020  . Transfusion history 1976  . Type 2 diabetes mellitus with stage 3 chronic kidney disease, without long-term current use of insulin ([place]) 08/11/2008   Ophth, Dr Kathrin Penner: no retinopathy  Diabetes maternal grandmother   Past Surgical History:  Procedure Laterality Date  . ABDOMINAL HYSTERECTOMY  1976   with BSO due to infection from [facility] IUD  . APPENDECTOMY  1976   @ [employer]     [id number], [phone]  . CATARACT EXTRACTION, BILATERAL Bilateral 2018  . COLONOSCOPY W/ POLYPECTOMY  2006   negative 2011; Dr Carlean Purl  . MASTECTOMY  W/ NODES PARTIAL  2012   double mastectomy with nodes taken out on right side   . PLACEMENT OF BREAST IMPLANTS  04/2011   Dr Migdalia Dk, [facility]  . TONSILLECTOMY AND ADENOIDECTOMY     Family History  Problem Relation Age of Onset  . Heart attack Father 7  . Breast cancer Sister 60        bilateral   . Heart failure Sister        PMH intensive chemotherapy  . Hypertension Sister   . Stroke Sister 35  . Stroke Mother        TMI  . Breast cancer Maternal Aunt 56  . Diabetes Maternal Grandmother   . Ovarian cancer Paternal Grandmother   . Breast cancer Maternal Aunt 77  . Colon cancer Neg Hx   . Stomach cancer Neg Hx   . Esophageal cancer Neg Hx   . Pancreatic cancer Neg Hx   . Liver disease Neg Hx    Social History   Socioeconomic History  . Marital status: Married  Spouse name: Not on file  . Number of children: 4  . Years of education: Not on file  . Highest education level: Not on file  Occupational History  . Occupation: retired  Tobacco Use  . Smoking status: Former Smoker    Quit date: 09/18/1977    Years since quitting: 42.9  . Smokeless tobacco: Never Used  . Tobacco comment: smoked 1957-1979 , up to 1 ppd  Vaping Use  . Vaping Use: Never used  Substance and Sexual Activity  . Alcohol use: No    Comment:  very rarely  . Drug use: No  . Sexual activity: Not Currently    Birth control/protection: Surgical  Other Topics Concern  . Not on file  Social History Narrative   Walking for exercise   Social Determinants of Health   Financial Resource Strain: Low Risk   . Difficulty of Paying Living Expenses: Not hard at all  Food Insecurity: No Food Insecurity  . Worried About Charity fundraiser in the Last Year: Never true  . Ran Out of Food in the Last Year: Never true  Transportation Needs: No Transportation Needs  . Lack of Transportation (Medical): No  . Lack of Transportation (Non-Medical): No  Physical Activity: Sufficiently Active  . Days of Exercise  per Week: 5 days  . Minutes of Exercise per Session: 30 min  Stress: No Stress Concern Present  . Feeling of Stress : Not at all  Social Connections: Socially Integrated  . Frequency of Communication with Friends and Family: More than three times a week  . Frequency of Social Gatherings with Friends and Family: Once a week  . Attends Religious Services: More than 4 times per year  . Active Member of Clubs or Organizations: No  . Attends Archivist Meetings: More than 4 times per year  . Marital Status: Married    Tobacco Counseling Counseling given: Not Answered Comment: smoked [phone] , up to 1 ppd   Clinical Intake:  Pre-visit preparation completed: Yes  Pain : No/denies pain Pain Score: 0-No pain     BMI - recorded: 28.08 Nutritional Status: BMI 25 -29 Overweight Nutritional Risks: None Diabetes: Yes CBG done?: No Did pt. bring in CBG monitor from home?: No  How often do you need to have someone help you when you read instructions, pamphlets, or other written materials from your doctor or pharmacy?: 1 - Never What is the last grade level you completed in school?: HSG; 2 years of college  Diabetic? yes  Interpreter Needed?: No  Information entered by :: Lisette Abu, LPN   Activities of Daily Living In your present state of health, do you have any difficulty performing the following activities: 08/27/2020  Hearing? N  Vision? N  Difficulty concentrating or making decisions? N  Walking or climbing stairs? Y  Dressing or bathing? N  Doing errands, shopping? N  Preparing Food and eating ? N  Using the Toilet? N  In the past six months, have you accidently leaked urine? Y  Do you have problems with loss of bowel control? N  Managing your Medications? N  Managing your Finances? N  Housekeeping or managing your Housekeeping? N  Some recent data might be hidden    Patient Care Team: Binnie Rail, MD as PCP - General (Internal  Medicine) Jerline Pain, MD as PCP - Cardiology (Cardiology) Pieter Partridge, DO as Consulting Physician (Neurology) Shon Hough, MD as Consulting Physician (Ophthalmology)  Indicate any recent  Medical Services you may have received from other than [facility] providers in the past year (date may be approximate).     Assessment:   This is a routine wellness examination for Adan[place].  Hearing/Vision screen No exam data present  Dietary issues and exercise activities discussed: Current Exercise Habits: Home exercise routine, Type of exercise: walking (walks up and down built ramp every day), Time (Minutes): 30, Frequency (Times/Week): 7, Weekly Exercise (Minutes/Week): 210, Intensity: Mild, Exercise limited by: cardiac condition(s)  Goals    . Patient Stated     Lose weight by increasing physical activity, monitoring my diet closely and using portion control.      Depression Screen PHQ 2/9 Scores 08/27/2020 04/01/2019 02/24/2019 02/20/2018 12/19/2017 08/15/2017 07/24/2016  PHQ - 2 Score 0 0 0 0 0 0 0    Fall Risk Fall Risk  08/27/2020 04/01/2019 02/24/2019 07/25/2018 02/20/2018  Falls in the past year? 0 0 1 1 No  Number falls in past yr: 0 0 0 1 -  Injury with Fall? 0 - 1 1 -  Risk for fall due to : No Fall Risks - - History of fall(s) Impaired mobility;Impaired balance/gait  Follow up Falls evaluation completed - - Falls evaluation completed -    FALL RISK PREVENTION PERTAINING TO THE HOME:  Any stairs in or around the home? No  If so, are there any without handrails? No  Home free of loose throw rugs in walkways, pet beds, electrical cords, etc? Yes  Adequate lighting in your home to reduce risk of falls? Yes   ASSISTIVE DEVICES UTILIZED TO PREVENT FALLS:  Life alert? No  Use of a cane, walker or w/c? Yes  Grab bars in the bathroom? Yes  Shower chair or bench in shower? Yes  Elevated toilet seat or a handicapped toilet? Yes   TIMED UP AND GO:  Was the test performed? No .   Length of time to ambulate 10 feet: 0 sec.   Gait steady and fast with assistive device  Cognitive Function: MMSE - Mini Mental State Exam 02/20/2018  Orientation to time 5  Orientation to Place 5  Registration 3  Attention/ Calculation 5  Recall 3  Language- name 2 objects 2  Language- repeat 1  Language- follow 3 step command 3  Language- read & follow direction 1  Write a sentence 1  Copy design 1  Total score 30   Normal cognitive status assessed by direct observation by this Nurse Health Advisor. No abnormalities found.        Immunizations Immunization History  Administered Date(s) Administered  . Fluad Quad(high Dose 65+) 06/28/2019, 07/17/2020  . H1N1 09/15/2008  . Influenza Split 07/24/2011  . Influenza Whole 07/29/2008, 08/06/2009, 08/19/2010  . Influenza, High Dose Seasonal PF 08/18/2013, 06/18/2015, 07/24/2016, 07/10/2017, 07/24/2018  . Influenza, Seasonal, Injecte, Preservative Fre 08/14/2012  . Influenza,inj,Quad PF,6+ Mos 05/28/2014  . PFIZER SARS-COV-2 Vaccination 10/10/2019, 10/31/2019, 07/31/2020  . Pneumococcal Conjugate-13 09/07/2014  . Pneumococcal Polysaccharide-23 10/05/2011  . Tetanus 02/26/2014  . Zoster 08/19/2011    TDAP status: Up to date  Flu Vaccine status: Up to date  Pneumococcal vaccine status: Up to date  Covid-19 vaccine status: Completed vaccines  Qualifies for Shingles Vaccine? Yes   Zostavax completed Yes   Shingrix Completed?: No.    Education has been provided regarding the importance of this vaccine. Patient has been advised to call insurance company to determine out of pocket expense if they have not yet received this vaccine. Advised may  also receive vaccine at local pharmacy or Health Dept. Verbalized acceptance and understanding.  Screening Tests Health Maintenance  Topic Date Due  . URINE MICROALBUMIN  07/24/2017  . OPHTHALMOLOGY EXAM  02/13/2020  . FOOT EXAM  02/24/2020  . DEXA SCAN  03/20/2020  . HEMOGLOBIN  A1C  08/02/2020  . COVID-19 Vaccine (4 - Booster for Pfizer series) 01/28/2021  . TETANUS/TDAP  02/27/2024  . INFLUENZA VACCINE  Completed  . Hepatitis C Screening  Completed  . PNA vac Low Risk Adult  Completed    Health Maintenance  Health Maintenance Due  Topic Date Due  . URINE MICROALBUMIN  07/24/2017  . OPHTHALMOLOGY EXAM  02/13/2020  . FOOT EXAM  02/24/2020  . DEXA SCAN  03/20/2020  . HEMOGLOBIN A1C  08/02/2020    Colorectal cancer screening: No longer required.   Mammogram status: No longer required due to not a candidate for mammograms.  Bone Density status: Completed 03/20/2018. Results reflect: Bone density results: OSTEOPENIA. Repeat every 2 years.  Lung Cancer Screening: (Low Dose CT Chest recommended if Age 40-80 years, 30 pack-year currently smoking OR have quit w/in 15years.) does not qualify.   Lung Cancer Screening Referral: no  Additional Screening:  Hepatitis C Screening: does qualify; Completed yes  Vision Screening: Recommended annual ophthalmology exams for early detection of glaucoma and other disorders of the eye. Is the patient up to date with their annual eye exam?  Yes  Who is the provider or what is the name of the office in which the patient attends annual eye exams? Shon Hough, MD. If pt is not established with a provider, would they like to be referred to a provider to establish care? No .   Dental Screening: Recommended annual dental exams for proper oral hygiene  Community Resource Referral / Chronic Care Management: CRR required this visit?  No   CCM required this visit?  No      Plan:     I have personally reviewed and noted the following in the patient's chart:   . Medical and social history . Use of alcohol, tobacco or illicit drugs  . Current medications and supplements . Functional ability and status . Nutritional status . Physical activity . Advanced directives . List of other physicians . Hospitalizations,  surgeries, and ER visits in previous 12 months . Vitals . Screenings to include cognitive, depression, and falls . Referrals and appointments  In addition, I have reviewed and discussed with patient certain preventive protocols, quality metrics, and best practice recommendations. A written personalized care plan for preventive services as well as general preventive health recommendations were provided to patient.     Sheral Flow, LPN   [id number]   Nurse Notes: n/a

## 2020-09-02 DIAGNOSIS — G20 Parkinson's disease: Secondary | ICD-10-CM | POA: Diagnosis not present

## 2020-09-02 DIAGNOSIS — R259 Unspecified abnormal involuntary movements: Secondary | ICD-10-CM | POA: Diagnosis not present

## 2020-09-07 DIAGNOSIS — G20 Parkinson's disease: Secondary | ICD-10-CM | POA: Diagnosis not present

## 2020-09-07 DIAGNOSIS — R259 Unspecified abnormal involuntary movements: Secondary | ICD-10-CM | POA: Diagnosis not present

## 2020-09-09 DIAGNOSIS — R259 Unspecified abnormal involuntary movements: Secondary | ICD-10-CM | POA: Diagnosis not present

## 2020-09-09 DIAGNOSIS — G20 Parkinson's disease: Secondary | ICD-10-CM | POA: Diagnosis not present

## 2020-09-16 ENCOUNTER — Other Ambulatory Visit: Payer: Self-pay | Admitting: Internal Medicine

## 2020-09-21 DIAGNOSIS — R259 Unspecified abnormal involuntary movements: Secondary | ICD-10-CM | POA: Diagnosis not present

## 2020-09-21 DIAGNOSIS — G20 Parkinson's disease: Secondary | ICD-10-CM | POA: Diagnosis not present

## 2020-09-23 DIAGNOSIS — G20 Parkinson's disease: Secondary | ICD-10-CM | POA: Diagnosis not present

## 2020-09-23 DIAGNOSIS — R259 Unspecified abnormal involuntary movements: Secondary | ICD-10-CM | POA: Diagnosis not present

## 2020-09-28 DIAGNOSIS — G20 Parkinson's disease: Secondary | ICD-10-CM | POA: Diagnosis not present

## 2020-09-28 DIAGNOSIS — R259 Unspecified abnormal involuntary movements: Secondary | ICD-10-CM | POA: Diagnosis not present

## 2020-09-30 DIAGNOSIS — G20 Parkinson's disease: Secondary | ICD-10-CM | POA: Diagnosis not present

## 2020-09-30 DIAGNOSIS — R259 Unspecified abnormal involuntary movements: Secondary | ICD-10-CM | POA: Diagnosis not present

## 2020-10-08 DIAGNOSIS — M25551 Pain in right hip: Secondary | ICD-10-CM | POA: Diagnosis not present

## 2020-10-08 DIAGNOSIS — S7001XA Contusion of right hip, initial encounter: Secondary | ICD-10-CM | POA: Diagnosis not present

## 2020-10-18 ENCOUNTER — Encounter: Payer: Self-pay | Admitting: Internal Medicine

## 2020-11-15 ENCOUNTER — Other Ambulatory Visit: Payer: Self-pay | Admitting: Internal Medicine

## 2020-12-06 ENCOUNTER — Telehealth: Payer: Self-pay | Admitting: Internal Medicine

## 2020-12-06 NOTE — Progress Notes (Signed)
  Chronic Care Management   Note  12/06/2020 Name: SHANETTE TAMARGO MRN: [id number] DOB: 04-16-1941  EVERLIE EBLE is a 80 y.o. year old female who is a primary care patient of Burns, Claudina Lick, MD. I reached out to [name] by phone today in response to a referral sent by Ms. Percell Boston Lococo's PCP, Binnie Rail, MD.   Ms. Suttles was given information about Chronic Care Management services today including:  1. CCM service includes personalized support from designated clinical staff supervised by her physician, including individualized plan of care and coordination with other care providers 2. 24/7 contact phone numbers for assistance for urgent and routine care needs. 3. Service will only be billed when office clinical staff spend 20 minutes or more in a month to coordinate care. 4. Only one practitioner may furnish and bill the service in a calendar month. 5. The patient may stop CCM services at any time (effective at the end of the month) by phone call to the office staff.   Patient agreed to services and verbal consent obtained.   Follow up plan:   Carley Perdue [employer] Scheduler

## 2020-12-09 DIAGNOSIS — S32591D Other specified fracture of right pubis, subsequent encounter for fracture with routine healing: Secondary | ICD-10-CM | POA: Diagnosis not present

## 2020-12-21 DIAGNOSIS — R2689 Other abnormalities of gait and mobility: Secondary | ICD-10-CM | POA: Diagnosis not present

## 2020-12-24 DIAGNOSIS — R2689 Other abnormalities of gait and mobility: Secondary | ICD-10-CM | POA: Diagnosis not present

## 2020-12-29 DIAGNOSIS — R2689 Other abnormalities of gait and mobility: Secondary | ICD-10-CM | POA: Diagnosis not present

## 2021-01-04 DIAGNOSIS — R2689 Other abnormalities of gait and mobility: Secondary | ICD-10-CM | POA: Diagnosis not present

## 2021-01-06 DIAGNOSIS — R2689 Other abnormalities of gait and mobility: Secondary | ICD-10-CM | POA: Diagnosis not present

## 2021-01-11 DIAGNOSIS — R2689 Other abnormalities of gait and mobility: Secondary | ICD-10-CM | POA: Diagnosis not present

## 2021-01-13 DIAGNOSIS — R2689 Other abnormalities of gait and mobility: Secondary | ICD-10-CM | POA: Diagnosis not present

## 2021-01-14 ENCOUNTER — Telehealth: Payer: Self-pay

## 2021-01-14 NOTE — Progress Notes (Signed)
    Chronic Care Management Pharmacy Assistant   Name: [name]  MRN: [id number] DOB: 12/18/1940  Reason for Encounter: Initial Questions  Appt.: OV 01/19/21 @ 11am  Recent office visits:  08/27/20 Burns (PCP) - Hypertension. No med changes. F/u 6 months.  Recent consult visits:  08/04/20 [facility] (Cardiology) - Paroxysmal atrial fibrillation. No med changes. F/u 2 years.  Hospital visits:  None in previous 6 months  Medications: Outpatient Encounter Medications as of 01/14/2021  Medication Sig  . amLODipine (NORVASC) 5 MG tablet TAKE 1 TABLET ONCE DAILY.  Marland Kitchen aspirin 81 MG chewable tablet Chew 1 tablet (81 mg total) by mouth daily.  . carbidopa-levodopa (SINEMET IR) 25-100 MG tablet TAKE 1 TABLET BY MOUTH 3 TIMES A DAY. (Patient taking differently: TAKE 1 TABLET BY MOUTH 2 TIMES A DAY.)  . carvedilol (COREG) 25 MG tablet TAKE 1 TABLET BY MOUTH TWICE DAILY WITH A MEAL.  Marland Kitchen Cyanocobalamin (VITAMIN B-12) 5000 MCG SUBL Place 5,000 mcg under the tongue daily.   . metFORMIN (GLUCOPHAGE) 500 MG tablet TAKE 2 TABLETS TWICE A DAY WITH MEALS.  . Multiple Vitamin (MULTIVITAMIN WITH MINERALS) TABS tablet Take 1 tablet by mouth daily.  . Omega-3 Fatty Acids (FISH OIL CONCENTRATE) 1000 MG CAPS Take 1,000 mg by mouth 2 (two) times daily.   No facility-administered encounter medications on file as of 01/14/2021.    Have you seen any other providers since your last visit?  Patient states she seen Dr. Wynelle Link and currently does therapy at rehab.  Any changes in your medications or health?  Patient states she fell in Jan. 2022 and hasn't been unable to keep her balance every since.  Any side effects from any medications?  Patient states no side effects at this time.  Do you have an symptoms or problems not managed by your medications?  Patient states no problems at this time.  Any concerns about your health right now?  Patient has no concerns at this time.  Has your provider asked that  you check blood pressure, blood sugar, or follow special diet at home?  Patient checks her BP 1x week ; last reading 4/21 - 147/80  Do you get any type of exercise on a regular basis?  Patient is currently doing Physical Therapy at [facility].  Can you think of a goal you would like to reach for your health? * Patient states she would like to be able to walk without a walker and maintain her balance.  Do you have any problems getting your medications? Patient has no problem getting medications.  Is there anything that you would like to discuss during the appointment?  Patient states not at this time.  Please bring medications and supplements to appointment  Star Rating Drugs: Metformin - last fill 08/05/20 90D (Patient states she has had this filled since and takes it everyday)  Orinda Kenner, [place] Pharmacists Assistant [phone]  Time Spent: 19

## 2021-01-17 ENCOUNTER — Encounter (HOSPITAL_COMMUNITY): Payer: Self-pay | Admitting: Emergency Medicine

## 2021-01-17 ENCOUNTER — Inpatient Hospital Stay (HOSPITAL_COMMUNITY)
Admission: EM | Admit: 2021-01-17 | Discharge: 2021-01-20 | DRG: 690 | Disposition: A | Discharge status: Home / Self Care | Payer: Medicare Other | Attending: Family Medicine | Admitting: Family Medicine

## 2021-01-17 ENCOUNTER — Emergency Department (HOSPITAL_COMMUNITY): Payer: Medicare Other

## 2021-01-17 DIAGNOSIS — N390 Urinary tract infection, site not specified: Secondary | ICD-10-CM | POA: Diagnosis not present

## 2021-01-17 DIAGNOSIS — Z20822 Contact with and (suspected) exposure to covid-19: Secondary | ICD-10-CM | POA: Diagnosis present

## 2021-01-17 DIAGNOSIS — Z7982 Long term (current) use of aspirin: Secondary | ICD-10-CM

## 2021-01-17 DIAGNOSIS — Z8249 Family history of ischemic heart disease and other diseases of the circulatory system: Secondary | ICD-10-CM | POA: Diagnosis not present

## 2021-01-17 DIAGNOSIS — G20 Parkinson's disease: Secondary | ICD-10-CM | POA: Diagnosis present

## 2021-01-17 DIAGNOSIS — B961 Klebsiella pneumoniae [K. pneumoniae] as the cause of diseases classified elsewhere: Secondary | ICD-10-CM | POA: Diagnosis present

## 2021-01-17 DIAGNOSIS — Z833 Family history of diabetes mellitus: Secondary | ICD-10-CM | POA: Diagnosis not present

## 2021-01-17 DIAGNOSIS — Z803 Family history of malignant neoplasm of breast: Secondary | ICD-10-CM | POA: Diagnosis not present

## 2021-01-17 DIAGNOSIS — Z8719 Personal history of other diseases of the digestive system: Secondary | ICD-10-CM

## 2021-01-17 DIAGNOSIS — Z853 Personal history of malignant neoplasm of breast: Secondary | ICD-10-CM

## 2021-01-17 DIAGNOSIS — Z79899 Other long term (current) drug therapy: Secondary | ICD-10-CM

## 2021-01-17 DIAGNOSIS — Z8041 Family history of malignant neoplasm of ovary: Secondary | ICD-10-CM | POA: Diagnosis not present

## 2021-01-17 DIAGNOSIS — Z743 Need for continuous supervision: Secondary | ICD-10-CM | POA: Diagnosis not present

## 2021-01-17 DIAGNOSIS — I129 Hypertensive chronic kidney disease with stage 1 through stage 4 chronic kidney disease, or unspecified chronic kidney disease: Secondary | ICD-10-CM | POA: Diagnosis not present

## 2021-01-17 DIAGNOSIS — N3001 Acute cystitis with hematuria: Secondary | ICD-10-CM

## 2021-01-17 DIAGNOSIS — Z87891 Personal history of nicotine dependence: Secondary | ICD-10-CM | POA: Diagnosis not present

## 2021-01-17 DIAGNOSIS — I480 Paroxysmal atrial fibrillation: Secondary | ICD-10-CM | POA: Diagnosis not present

## 2021-01-17 DIAGNOSIS — Z7984 Long term (current) use of oral hypoglycemic drugs: Secondary | ICD-10-CM

## 2021-01-17 DIAGNOSIS — M8580 Other specified disorders of bone density and structure, unspecified site: Secondary | ICD-10-CM | POA: Diagnosis not present

## 2021-01-17 DIAGNOSIS — I10 Essential (primary) hypertension: Secondary | ICD-10-CM | POA: Diagnosis not present

## 2021-01-17 DIAGNOSIS — R531 Weakness: Secondary | ICD-10-CM | POA: Diagnosis present

## 2021-01-17 DIAGNOSIS — R6889 Other general symptoms and signs: Secondary | ICD-10-CM | POA: Diagnosis not present

## 2021-01-17 DIAGNOSIS — Z885 Allergy status to narcotic agent status: Secondary | ICD-10-CM

## 2021-01-17 DIAGNOSIS — K219 Gastro-esophageal reflux disease without esophagitis: Secondary | ICD-10-CM | POA: Diagnosis present

## 2021-01-17 DIAGNOSIS — E1122 Type 2 diabetes mellitus with diabetic chronic kidney disease: Secondary | ICD-10-CM | POA: Diagnosis not present

## 2021-01-17 DIAGNOSIS — N1831 Chronic kidney disease, stage 3a: Secondary | ICD-10-CM | POA: Diagnosis present

## 2021-01-17 DIAGNOSIS — Z888 Allergy status to other drugs, medicaments and biological substances status: Secondary | ICD-10-CM

## 2021-01-17 DIAGNOSIS — N183 Chronic kidney disease, stage 3 unspecified: Secondary | ICD-10-CM | POA: Diagnosis present

## 2021-01-17 DIAGNOSIS — M170 Bilateral primary osteoarthritis of knee: Secondary | ICD-10-CM | POA: Diagnosis not present

## 2021-01-17 DIAGNOSIS — Z9013 Acquired absence of bilateral breasts and nipples: Secondary | ICD-10-CM | POA: Diagnosis not present

## 2021-01-17 DIAGNOSIS — E1142 Type 2 diabetes mellitus with diabetic polyneuropathy: Secondary | ICD-10-CM | POA: Diagnosis not present

## 2021-01-17 DIAGNOSIS — R509 Fever, unspecified: Secondary | ICD-10-CM | POA: Diagnosis not present

## 2021-01-17 DIAGNOSIS — Z823 Family history of stroke: Secondary | ICD-10-CM | POA: Diagnosis not present

## 2021-01-17 DIAGNOSIS — E538 Deficiency of other specified B group vitamins: Secondary | ICD-10-CM | POA: Diagnosis not present

## 2021-01-17 DIAGNOSIS — R0902 Hypoxemia: Secondary | ICD-10-CM | POA: Diagnosis not present

## 2021-01-17 LAB — COMPREHENSIVE METABOLIC PANEL
ALT: 30 U/L (ref 0–44)
AST: 31 U/L (ref 15–41)
Albumin: 3.9 g/dL (ref 3.5–5.0)
Alkaline Phosphatase: 59 U/L (ref 38–126)
Anion gap: 10 (ref 5–15)
BUN: 18 mg/dL (ref 8–23)
CO2: 23 mmol/L (ref 22–32)
Calcium: 9.3 mg/dL (ref 8.9–10.3)
Chloride: 108 mmol/L (ref 98–111)
Creatinine, Ser: 1.15 mg/dL — ABNORMAL HIGH (ref 0.44–1.00)
GFR, Estimated: 48 mL/min — ABNORMAL LOW (ref >60)
Glucose, Bld: 132 mg/dL — ABNORMAL HIGH (ref 70–99)
Potassium: 3.9 mmol/L (ref 3.5–5.1)
Sodium: 141 mmol/L (ref 135–145)
Total Bilirubin: 2 mg/dL — ABNORMAL HIGH (ref 0.3–1.2)
Total Protein: 7 g/dL (ref 6.5–8.1)

## 2021-01-17 LAB — URINALYSIS, ROUTINE W REFLEX MICROSCOPIC
Bilirubin Urine: NEGATIVE
Glucose, UA: NEGATIVE mg/dL
Ketones, ur: 5 mg/dL — AB
Nitrite: NEGATIVE
Protein, ur: NEGATIVE mg/dL
Specific Gravity, Urine: 1.014 (ref 1.005–1.030)
pH: 6 (ref 5.0–8.0)

## 2021-01-17 LAB — CBC WITH DIFFERENTIAL/PLATELET
Abs Immature Granulocytes: 0.04 10*3/uL (ref 0.00–0.07)
Basophils Absolute: 0 10*3/uL (ref 0.0–0.1)
Basophils Relative: 0 %
Eosinophils Absolute: 0 10*3/uL (ref 0.0–0.5)
Eosinophils Relative: 0 %
HCT: 39.3 % (ref 36.0–46.0)
Hemoglobin: 13.2 g/dL (ref 12.0–15.0)
Immature Granulocytes: 0 %
Lymphocytes Relative: 7 %
Lymphs Abs: 0.9 10*3/uL (ref 0.7–4.0)
MCH: 29.4 pg (ref 26.0–34.0)
MCHC: 33.6 g/dL (ref 30.0–36.0)
MCV: 87.5 fL (ref 80.0–100.0)
Monocytes Absolute: 1.1 10*3/uL — ABNORMAL HIGH (ref 0.1–1.0)
Monocytes Relative: 9 %
Neutro Abs: 10.5 10*3/uL — ABNORMAL HIGH (ref 1.7–7.7)
Neutrophils Relative %: 84 %
Platelets: 141 10*3/uL — ABNORMAL LOW (ref 150–400)
RBC: 4.49 MIL/uL (ref 3.87–5.11)
RDW: 13.6 % (ref 11.5–15.5)
WBC: 12.6 10*3/uL — ABNORMAL HIGH (ref 4.0–10.5)
nRBC: 0 % (ref 0.0–0.2)

## 2021-01-17 MED ORDER — ACETAMINOPHEN 325 MG PO TABS: 650.0000 mg | ORAL_TABLET | Freq: Four times a day (QID) | ORAL | Status: DC | PRN

## 2021-01-17 MED ORDER — SODIUM CHLORIDE 0.9 % IV SOLN
1.0000 g | Freq: Once | INTRAVENOUS | Status: AC
  Administered 2021-01-17: 1 g via INTRAVENOUS
  Filled 2021-01-17: qty 10

## 2021-01-17 MED ORDER — ONDANSETRON HCL 4 MG/2ML IJ SOLN: 4.0000 mg | Freq: Four times a day (QID) | INTRAMUSCULAR | Status: DC | PRN

## 2021-01-17 MED ORDER — ACETAMINOPHEN 650 MG RE SUPP: 650.0000 mg | Freq: Four times a day (QID) | RECTAL | Status: DC | PRN

## 2021-01-17 MED ORDER — SODIUM CHLORIDE 0.9 % IV SOLN: INTRAVENOUS | Status: DC

## 2021-01-17 MED ORDER — ONDANSETRON HCL 4 MG PO TABS
4.0000 mg | ORAL_TABLET | Freq: Four times a day (QID) | ORAL | Status: DC | PRN
Start: 2021-01-17 — End: 2021-01-20

## 2021-01-17 MED ORDER — INSULIN ASPART 100 UNIT/ML IJ SOLN
0.0000 [IU] | Freq: Three times a day (TID) | INTRAMUSCULAR | Status: DC
  Filled 2021-01-17: qty 0.15

## 2021-01-17 MED ORDER — SODIUM CHLORIDE 0.9 % IV BOLUS
500.0000 mL | Freq: Once | INTRAVENOUS | Status: AC
  Administered 2021-01-17: 500 mL via INTRAVENOUS

## 2021-01-17 NOTE — ED Triage Notes (Signed)
Pt feel weak and unable to ambulate. EMS found pt to have a low grade fever, and pt promotes that her urine has been foul smelling.

## 2021-01-17 NOTE — H&P (Signed)
History and Physical   [name] BTD:[id number] DOB: [birth year] DOA: 01/17/2021  Referring MD/NP/PA: Dr. Alvino Chapel  PCP: Carol Rail, MD   Patient coming from: Home  Chief Complaint: Dysuria and altered mental status  HPI: [name] is a 80 y.o. female with medical history significant of complicated UTI in the past, chronic kidney disease stage III, diabetes, GERD, essential hypertension, breast cancer, peripheral neuropathy, Parkinson's disease who presented with some mild altered mental status and dysuria.  Patient also has significant global weakness.  Denies fever or chills denied any nausea or vomiting.  Patient was seen and evaluated and found to have significant evidence of UTI.  She was also mildly confused.  Patient apparently has been using a walker since January where she had bilateral pelvic fractures.  She was seen and followed by Dr. Maureen Meyers with the fracture is now fully healed but still having gait abnormalities.  She has been going to outpatient rehab twice a week but has become progressively weaker this week.  Now she is not able to do much at home.  Her caregiver was worried so brought her to the ER for evaluation and treatment.  With her age and profound weakness she has been admitted for IV antibiotics and for observation..  ED Course: Temperature 99.7, blood pressure 153/71, pulse 92, respiratory 18 and oxygen sat 95% on room air.  White count is 12.6, hemoglobin 13.7 platelets 141.  Sodium 141 potassium 3.9 chloride 108.  CO2 23, BUN 18 and creatinine 1.15.  Calcium 9.3.  Glucose 132.  Urinalysis shows WBC 21-50 and rare bacteria.  Patient will be admitted for observation and treatment for UTI  Review of Systems: As per HPI otherwise 10 point review of systems negative.    Past Medical History:  Diagnosis Date  . Acute renal failure superimposed on stage 3 chronic kidney disease ([place]) 01/31/2020  . Breast cancer ([place]) 2012  . Cancer of central portion  of right female breast ([place]) 09/04/2010   Patient has a long history of fibrocystic disease. 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 She underwent right total mastectomy with sentinel node biopsy and left total (prophylactic) mastectomy on 10/12/2010. She underwent immediate reconstruction with expander and AlloDerm placement. 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 One lymph  . Complicated UTI (urinary tract infection) 01/31/2020  . Diabetes mellitus   . Dizziness 02/24/2019  . Elevated LFTs 08/10/2014  . Episodic lightheadedness 08/27/2019  . Esophageal reflux 05/06/2008  . Essential hypertension 02/16/2009  . Family history of breast cancer   . Family history of ovarian cancer   . Fatigue 02/24/2019  . Fatty liver 08/05/2015  . Genetic testing 09/04/2017   Negative genetic testing on the common hereditary cancer panel.  The Hereditary Gene Panel offered by [facility] includes sequencing and/or deletion duplication testing of the following 47 genes: APC, ATM, AXIN2, BARD1, BMPR1A, BRCA1, BRCA2, BRIP1, CDH1, CDK4, CDKN2A (p14ARF), CDKN2A (p16INK4a), CHEK2, CTNNA1, DICER1, EPCAM (Deletion/duplication testing only), GREM1 (promoter region deletion/duplicat  . GERD (gastroesophageal reflux disease)   . Gilbert syndrome   . GILBERT'S SYNDROME 02/06/2007   Qualifier: Diagnosis of  By: [name]    . History of breast cancer 08/04/2015   S/p b/l mastectomy  . History of colonic polyps 06/06/2010   Annotation: destroyed, no clear adenomatous proven Qualifier: Diagnosis of  By: Carol Purl MD, Tonna Boehringer E  2008 polyp 2013 neg    . Hypertension   .  Hypertriglyceridemia 09/30/2007   statin intolerant  Father MI @ 60 Sister CVA > 26   . Hypokalemia due to inadequate potassium intake 01/31/2020  . IBS (irritable bowel syndrome)   . Joint pain 09/02/2010  . Lactic acidosis 01/31/2020  . Osteoarthritis of left knee 08/04/2015   Significant arthritis  in knees, limits her activity   . Osteopenia 08/31/2013   [facility]  DEXA 03/20/2018: Osteopenia:  LFN -1.3, R--1.0, spine -0.6-statistically significant increase in BMD in all areas  Findings 08/22/13 : lowest T score -  1.3  @  R femoral neck (hip) ,3.4  %loss @ R femur &   4.1 % in spine   since  2012 Dexa 01/05/16: lowest  - L femur neck T -1.6 Diagnosis: mild Osteopenia Rx: Fosamax remotely; now on Arimidex   . 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 Peripheral neuropathy 08/23/2018  . Plantar fasciitis of right foot    Dr Oneta Rack  . Poor balance 02/20/2018  . S/P bilateral mastectomy 08/04/2015  . Septic shock ([place]) 01/31/2020  . Severe sepsis with acute organ dysfunction due to gram-negative bacteria ([place]) 01/31/2020  . Transfusion history 1976  . Type 2 diabetes mellitus with stage 3 chronic kidney disease, without long-term current use of insulin ([place]) 08/11/2008   Ophth, Dr Kathrin Penner: no retinopathy  Diabetes maternal grandmother    Past Surgical History:  Procedure Laterality Date  . ABDOMINAL HYSTERECTOMY  1976   with BSO due to infection from [facility] IUD  . APPENDECTOMY  1976   @ [place]     [id number], [phone]  . CATARACT EXTRACTION, BILATERAL Bilateral 2018  . COLONOSCOPY W/ POLYPECTOMY  2006   negative 2011; Dr [name]  . MASTECTOMY W/ NODES PARTIAL  2012   double mastectomy with nodes taken out on right side   . PLACEMENT OF BREAST IMPLANTS  04/2011   Dr Migdalia Dk, [facility]  . TONSILLECTOMY AND ADENOIDECTOMY       reports that she quit smoking about 43 years ago. She has never used smokeless tobacco. She reports that she does not drink alcohol and does not use drugs.  Allergies  Allergen Reactions  . Elemental Sulfur     Flushed, funny feeling in throat   . Exemestane Nausea Only    Other reaction(s): Dizziness (intolerance)  . Morphine And Related   . Statins Other (See Comments)    Elevated LFTs  . Ace Inhibitors     REACTION: COUGH   . Codeine     REACTION: VOMITTING    Family History  Problem Relation Age of Onset  . Heart attack Father 53  . Breast cancer Sister 37        bilateral   . Heart failure Sister        PMH intensive chemotherapy  . Hypertension Sister   . Stroke Sister 83  . Stroke Mother        TMI  . Breast cancer Maternal Aunt 13  . Diabetes Maternal Grandmother   . Ovarian cancer Paternal Grandmother   . Breast cancer Maternal Aunt 77  . Colon cancer Neg Hx   . Stomach cancer Neg Hx   . Esophageal cancer Neg Hx   . Pancreatic cancer Neg Hx   . Liver disease Neg Hx      Prior to Admission medications   Medication Sig Start Date End Date Taking? Authorizing Provider  amLODipine (NORVASC) 5 MG tablet TAKE 1 TABLET ONCE DAILY. 11/16/20  Carol Rail, MD  aspirin 81 MG chewable tablet Chew 1 tablet (81 mg total) by mouth daily. 02/07/20   Matcha, Anupama, MD  carbidopa-levodopa (SINEMET IR) 25-100 MG tablet TAKE 1 TABLET BY MOUTH 3 TIMES A DAY. Patient taking differently: TAKE 1 TABLET BY MOUTH 2 TIMES A DAY. 01/16/19   Jaffe, Adam R, DO  carvedilol (COREG) 25 MG tablet TAKE 1 TABLET BY MOUTH TWICE DAILY WITH A MEAL. 05/03/20   Carol Rail, MD  Cyanocobalamin (VITAMIN B-12) 5000 MCG SUBL Place 5,000 mcg under the tongue daily.     [provider]  metFORMIN (GLUCOPHAGE) 500 MG tablet TAKE 2 TABLETS TWICE A DAY WITH MEALS. 10/08/19   Carol Rail, MD  Multiple Vitamin (MULTIVITAMIN WITH MINERALS) TABS tablet Take 1 tablet by mouth daily.    [provider]  Omega-3 Fatty Acids (FISH OIL CONCENTRATE) 1000 MG CAPS Take 1,000 mg by mouth 2 (two) times daily.    [provider]    Physical Exam: Vitals:   01/17/21 2015 01/17/21 2030 01/17/21 2104 01/17/21 2200  BP: (!) 153/71 (!) 146/60 (!) 146/74 (!) 149/85  Pulse: 89 85 86 92  Resp:  $Remo'15 18 13  'AvrQw$ Temp:      TempSrc:      SpO2: 96% 97% 98% 97%  Weight:      Height:          Constitutional: Mildly confused,  acutely ill Vitals:   01/17/21 2015 01/17/21 2030 01/17/21 2104 01/17/21 2200  BP: (!) 153/71 (!) 146/60 (!) 146/74 (!) 149/85  Pulse: 89 85 86 92  Resp:  $Remo'15 18 13  'pzFSN$ Temp:      TempSrc:      SpO2: 96% 97% 98% 97%  Weight:      Height:       Eyes: PERRL, lids and conjunctivae normal ENMT: Mucous membranes are dry. Posterior pharynx clear of any exudate or lesions.Normal dentition.  Neck: normal, supple, no masses, no thyromegaly Respiratory: clear to auscultation bilaterally, no wheezing, no crackles. Normal respiratory effort. No accessory muscle use.  Cardiovascular: Regular rate and rhythm, no murmurs / rubs / gallops. No extremity edema. 2+ pedal pulses. No carotid bruits.  Abdomen: no tenderness, no masses palpated. No hepatosplenomegaly. Bowel sounds positive.  Musculoskeletal: no clubbing / cyanosis. No joint deformity upper and lower extremities. Good ROM, no contractures. Normal muscle tone.  Skin: no rashes, lesions, ulcers. No induration Neurologic: CN 2-12 grossly intact. Sensation intact, DTR normal. Strength 5/5 in all 4.  Psychiatric: Normal judgment and insight. Alert and oriented x 3.  Depressed mood.     Labs on Admission: I have personally reviewed following labs and imaging studies  CBC: Recent Labs  Lab 01/17/21 2100  WBC 12.6*  NEUTROABS 10.5*  HGB 13.2  HCT 39.3  MCV 87.5  PLT 453*   Basic Metabolic Panel: Recent Labs  Lab 01/17/21 2100  NA 141  K 3.9  CL 108  CO2 23  GLUCOSE 132*  BUN 18  CREATININE 1.15*  CALCIUM 9.3   GFR: Estimated Creatinine Clearance: 39.4 mL/min (A) (by C-G formula based on SCr of 1.15 mg/dL (H)). Liver Function Tests: Recent Labs  Lab 01/17/21 2100  AST 31  ALT 30  ALKPHOS 59  BILITOT 2.0*  PROT 7.0  ALBUMIN 3.9   No results for input(s): LIPASE, AMYLASE in the last 168 hours. No results for input(s): AMMONIA in the last 168 hours. Coagulation Profile: No results for input(s): INR,  PROTIME in the last 168  hours. Cardiac Enzymes: No results for input(s): CKTOTAL, CKMB, CKMBINDEX, TROPONINI in the last 168 hours. BNP (last 3 results) No results for input(s): PROBNP in the last 8760 hours. HbA1C: No results for input(s): HGBA1C in the last 72 hours. CBG: No results for input(s): GLUCAP in the last 168 hours. Lipid Profile: No results for input(s): CHOL, HDL, LDLCALC, TRIG, CHOLHDL, LDLDIRECT in the last 72 hours. Thyroid Function Tests: No results for input(s): TSH, T4TOTAL, FREET4, T3FREE, THYROIDAB in the last 72 hours. Anemia Panel: No results for input(s): VITAMINB12, FOLATE, FERRITIN, TIBC, IRON, RETICCTPCT in the last 72 hours. Urine analysis:    Component Value Date/Time   COLORURINE YELLOW 01/17/2021 2200   APPEARANCEUR CLEAR 01/17/2021 2200   LABSPEC 1.014 01/17/2021 2200   PHURINE 6.0 01/17/2021 2200   GLUCOSEU NEGATIVE 01/17/2021 2200   HGBUR SMALL (A) 01/17/2021 2200   HGBUR negative 11/17/2010 1626   BILIRUBINUR NEGATIVE 01/17/2021 2200   KETONESUR 5 (A) 01/17/2021 2200   PROTEINUR NEGATIVE 01/17/2021 2200   UROBILINOGEN negative 11/17/2010 1626   NITRITE NEGATIVE 01/17/2021 2200   LEUKOCYTESUR MODERATE (A) 01/17/2021 2200   Sepsis Labs: $RemoveBefo'@LABRCNTIP'STUTGKAbmbY$ (procalcitonin:4,lacticidven:4) )No results found for this or any previous visit (from the past 240 hour(s)).   Radiological Exams on Admission: DG Chest Portable 1 View  Result Date: 01/17/2021 CLINICAL DATA:  Weakness EXAM: PORTABLE CHEST 1 VIEW COMPARISON:  Jan 31, 2020 FINDINGS: The heart size and mediastinal contours are within normal limits. Both lungs are clear. The visualized skeletal structures are unremarkable. IMPRESSION: No active disease. Electronically Signed   By: Constance Holster M.D.   On: 01/17/2021 20:40    Assessment/Plan Principal Problem:   UTI (urinary tract infection) Active Problems:   Type 2 diabetes mellitus with stage 3 chronic kidney disease, without long-term current use of insulin  (HCC)   Essential hypertension   History of breast cancer   CKD (chronic kidney disease) stage 3, GFR 30-59 ml/min (HCC)   Paroxysmal atrial fibrillation (HCC)   Parkinson disease ([place])     #1 UTI: Patient will be initiated on IV Rocephin.  Will obtain urine and blood cultures and follow results.  Continue supportive care.  Hydrate.  #2 diabetes: Initiate sliding scale.  Continue treatment.  #3  Essential hypertension: Continue with blood pressure control.  #4 paroxysmal atrial fibrillation: Stable.  Monitor closely.  #5 Parkinson's disease: Confirm on resume home regimen.  #6 history of breast cancer: Continue outpatient treatment and follow-up with oncology.   DVT prophylaxis: Lovenox Code Status: Full code Family Communication: No family at bedside Disposition Plan: Home Consults called: None Admission status: Observation  Severity of Illness: The appropriate patient status for this patient is OBSERVATION. Observation status is judged to be reasonable and necessary in order to provide the required intensity of service to ensure the patient's safety. The patient's presenting symptoms, physical exam findings, and initial radiographic and laboratory data in the context of their medical condition is felt to place them at decreased risk for further clinical deterioration. Furthermore, it is anticipated that the patient will be medically stable for discharge from the hospital within 2 midnights of admission. The following factors support the patient status of observation.   " The patient's presenting symptoms include confusion and dysuria. " The physical exam findings include dry mucous membranes. " The initial radiographic and laboratory data are evidence of UTI.     Barbette Merino MD [facility] Pager [phone]  If 7PM-7AM, please  contact night-coverage [email or web address] Password [facility]  01/17/2021, 11:35 PM

## 2021-01-17 NOTE — ED Provider Notes (Signed)
[facility] DEPT Provider Note   CSN: [id number] Arrival date & time: 01/17/21  1817     History Chief Complaint  Patient presents with  . Weakness    [name] is a 80 y.o. female.  HPI Patient with generalized weakness.  States that she felt as if she had to go to the bathroom but could not get up at home.  Usually should be able to get up and down without difficulty.  States she felt weak all over.  Did not lateralize to the right or left.  States urine has been more foul-smelling.  No chest pain.  No trouble breathing.  No headache.  Reportedly has had some mild confusion.    Past Medical History:  Diagnosis Date  . Acute renal failure superimposed on stage 3 chronic kidney disease ([place]) 01/31/2020  . Breast cancer ([place]) 2012  . Cancer of central portion of right female breast ([place]) 09/04/2010   Patient has a long history of fibrocystic disease. 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 She underwent right total mastectomy with sentinel node biopsy and left total (prophylactic) mastectomy on 10/12/2010. She underwent immediate reconstruction with expander and AlloDerm placement. 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 One lymph  . Complicated UTI (urinary tract infection) 01/31/2020  . Diabetes mellitus   . Dizziness 02/24/2019  . Elevated LFTs 08/10/2014  . Episodic lightheadedness 08/27/2019  . Esophageal reflux 05/06/2008  . Essential hypertension 02/16/2009  . Family history of breast cancer   . Family history of ovarian cancer   . Fatigue 02/24/2019  . Fatty liver 08/05/2015  . Genetic testing 09/04/2017   Negative genetic testing on the common hereditary cancer panel.  The Hereditary Gene Panel offered by [facility] includes sequencing and/or deletion duplication testing of the following 47 genes: APC, ATM, AXIN2, BARD1, BMPR1A, BRCA1, BRCA2, BRIP1, CDH1, CDK4, CDKN2A (p14ARF),  CDKN2A (p16INK4a), CHEK2, CTNNA1, DICER1, EPCAM (Deletion/duplication testing only), GREM1 (promoter region deletion/duplicat  . GERD (gastroesophageal reflux disease)   . Gilbert syndrome   . GILBERT'S SYNDROME 02/06/2007   Qualifier: Diagnosis of  By: Janelle Floor    . History of breast cancer 08/04/2015   S/p b/l mastectomy  . History of colonic polyps 06/06/2010   Annotation: destroyed, no clear adenomatous proven Qualifier: Diagnosis of  By: Carlean Purl MD, Tonna Boehringer E  2008 polyp 2013 neg    . Hypertension   . Hypertriglyceridemia 09/30/2007   statin intolerant  Father MI @ 76 Sister CVA > 57   . Hypokalemia due to inadequate potassium intake 01/31/2020  . IBS (irritable bowel syndrome)   . Joint pain 09/02/2010  . Lactic acidosis 01/31/2020  . Osteoarthritis of left knee 08/04/2015   Significant arthritis in knees, limits her activity   . Osteopenia 08/31/2013   [facility]  DEXA 03/20/2018: Osteopenia:  LFN -1.3, R--1.0, spine -0.6-statistically significant increase in BMD in all areas  Findings 08/22/13 : lowest T score -  1.3  @  R femoral neck (hip) ,3.4  %loss @ R femur &   4.1 % in spine   since  2012 Dexa 01/05/16: lowest  - L femur neck T -1.6 Diagnosis: mild Osteopenia Rx: Fosamax remotely; now on Arimidex   . 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 Peripheral neuropathy 08/23/2018  . Plantar fasciitis of right foot    Dr Oneta Rack  . Poor balance 02/20/2018  . S/P bilateral mastectomy 08/04/2015  . Septic shock ([place])  01/31/2020  . Severe sepsis with acute organ dysfunction due to gram-negative bacteria ([place]) 01/31/2020  . Transfusion history 1976  . Type 2 diabetes mellitus with stage 3 chronic kidney disease, without long-term current use of insulin ([place]) 08/11/2008   Ophth, Dr Kathrin Penner: no retinopathy  Diabetes maternal grandmother    Patient Active Problem List   Diagnosis Date Noted  . Aortic atherosclerosis ([place]) 08/27/2020  . 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 Parkinson disease  ([place]) 08/26/2020  . Paroxysmal atrial fibrillation ([place]) 02/25/2020  . Complicated UTI (urinary tract infection) 01/31/2020  . CKD (chronic kidney disease) stage 3, GFR 30-59 ml/min ([facility]) 01/31/2020  . Hypokalemia due to inadequate potassium intake 01/31/2020  . Severe sepsis with acute organ dysfunction due to Gram negative bacteria ([place]) 01/31/2020  . Episodic lightheadedness 08/27/2019  . Dizziness 02/24/2019  . Peripheral neuropathy 08/23/2018  . Poor balance 02/20/2018  . Genetic testing 09/04/2017  . Family history of breast cancer   . Family history of ovarian cancer   . Fatty liver 08/05/2015  . History of breast cancer 08/04/2015  . S/P bilateral mastectomy 08/04/2015  . Osteoarthritis of left knee 08/04/2015  . Elevated LFTs 08/10/2014  . Osteopenia 08/31/2013  . Transfusion history 08/18/2013  . IBS (irritable bowel syndrome) 08/18/2013  . 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 Joint pain 09/02/2010  . History of colonic polyps 06/06/2010  . Essential hypertension 02/16/2009  . Type 2 diabetes mellitus with stage 3 chronic kidney disease, without long-term current use of insulin ([place]) 08/11/2008  . Hypertriglyceridemia 09/30/2007  . [place] SYNDROME 02/06/2007    Past Surgical History:  Procedure Laterality Date  . ABDOMINAL HYSTERECTOMY  1976   with BSO due to infection from [facility] IUD  . APPENDECTOMY  1976   @ [facility]     [id number], [phone]  . CATARACT EXTRACTION, BILATERAL Bilateral 2018  . COLONOSCOPY W/ POLYPECTOMY  2006   negative 2011; Dr Carlean Purl  . MASTECTOMY W/ NODES PARTIAL  2012   double mastectomy with nodes taken out on right side   . PLACEMENT OF BREAST IMPLANTS  04/2011   Dr Migdalia Dk, [facility]  . TONSILLECTOMY AND ADENOIDECTOMY       OB History   No obstetric history on file.     Family History  Problem Relation Age of Onset  . Heart attack Father 5  . Breast cancer  Sister 1        bilateral   . Heart failure Sister        PMH intensive chemotherapy  . Hypertension Sister   . Stroke Sister 79  . Stroke Mother        TMI  . Breast cancer Maternal Aunt 52  . Diabetes Maternal Grandmother   . Ovarian cancer Paternal Grandmother   . Breast cancer Maternal Aunt 77  . Colon cancer Neg Hx   . Stomach cancer Neg Hx   . Esophageal cancer Neg Hx   . Pancreatic cancer Neg Hx   . Liver disease Neg Hx     Social History   Tobacco Use  . Smoking status: Former Smoker    Quit date: 09/18/1977    Years since quitting: 43.3  . Smokeless tobacco: Never Used  . Tobacco comment: smoked 1957-1979 , up to 1 ppd  Vaping Use  . Vaping Use: Never used  Substance Use Topics  . Alcohol use: No    Comment:  very rarely  . Drug use: No    Home Medications Prior to Admission medications   Medication Sig Start Date End Date Taking? Authorizing Provider  amLODipine (NORVASC) 5 MG tablet TAKE 1 TABLET ONCE DAILY. 11/16/20   Binnie Rail, MD  aspirin 81 MG chewable tablet Chew 1 tablet (81 mg total) by mouth daily. 02/07/20   Matcha, Anupama, MD  carbidopa-levodopa (SINEMET IR) 25-100 MG tablet TAKE 1 TABLET BY MOUTH 3 TIMES A DAY. Patient taking differently: TAKE 1 TABLET BY MOUTH 2 TIMES A DAY. 01/16/19   Jaffe, Adam R, DO  carvedilol (COREG) 25 MG tablet TAKE 1 TABLET BY MOUTH TWICE DAILY WITH A MEAL. 05/03/20   Binnie Rail, MD  Cyanocobalamin (VITAMIN B-12) 5000 MCG SUBL Place 5,000 mcg under the tongue daily.     [provider]  metFORMIN (GLUCOPHAGE) 500 MG tablet TAKE 2 TABLETS TWICE A DAY WITH MEALS. 10/08/19   Binnie Rail, MD  Multiple Vitamin (MULTIVITAMIN WITH MINERALS) TABS tablet Take 1 tablet by mouth daily.    [provider]  Omega-3 Fatty Acids (FISH OIL CONCENTRATE) 1000 MG CAPS Take 1,000 mg by mouth 2 (two) times daily.    [provider]    Allergies    Elemental sulfur, Exemestane, Morphine and related, Statins,  Ace inhibitors, and Codeine  Review of Systems   Review of Systems  Constitutional: Positive for appetite change and fatigue.  HENT: Negative for congestion.   Respiratory: Negative for shortness of breath.   Cardiovascular: Negative for chest pain.  Gastrointestinal: Negative for abdominal distention.  Endocrine: Negative for polyuria.  Genitourinary: Positive for dysuria.  Musculoskeletal: Negative for back pain.  Skin: Negative for rash.  Neurological: Positive for weakness.  Psychiatric/Behavioral: Positive for confusion.    Physical Exam Updated Vital Signs BP (!) 149/85   Pulse 92   Temp 99.7 F (37.6 C) (Oral)   Resp 13   Ht 5' 4.5" (1.638 m)   Wt 73.5 kg   SpO2 97%   BMI 27.38 kg/m   Physical Exam Vitals and nursing note reviewed.  HENT:     Head: Atraumatic.  Eyes:     Pupils: Pupils are equal, round, and reactive to light.  Cardiovascular:     Rate and Rhythm: Regular rhythm.  Pulmonary:     Breath sounds: No wheezing or rhonchi.  Abdominal:     Tenderness: There is no abdominal tenderness.  Musculoskeletal:        General: No tenderness.     Cervical back: Neck supple.  Skin:    General: Skin is warm.     Capillary Refill: Capillary refill takes less than 2 seconds.  Neurological:     Mental Status: She is alert and oriented to person, place, and time.     Comments: Awake and appropriate.  Moving all extremities.  Face symmetric.  Good grip strength bilaterally.     ED Results / Procedures / Treatments   Labs (all labs ordered are listed, but only abnormal results are displayed) Labs Reviewed  COMPREHENSIVE METABOLIC PANEL - Abnormal; Notable for the following components:      Result Value   Glucose, Bld 132 (*)    Creatinine, Ser 1.15 (*)    Total Bilirubin 2.0 (*)    GFR, Estimated 48 (*)    All other components within normal limits  CBC WITH DIFFERENTIAL/PLATELET - Abnormal; Notable for the following components:   WBC 12.6 (*)     Platelets  141 (*)    Neutro Abs 10.5 (*)    Monocytes Absolute 1.1 (*)    All other components within normal limits  URINALYSIS, ROUTINE W REFLEX MICROSCOPIC - Abnormal; Notable for the following components:   Hgb urine dipstick SMALL (*)    Ketones, ur 5 (*)    Leukocytes,Ua MODERATE (*)    Bacteria, UA RARE (*)    All other components within normal limits  URINE CULTURE  SARS CORONAVIRUS 2 (TAT 6-24 HRS)    EKG EKG Interpretation  Date/Time:  Monday Jan 17 2021 20:50:37 EDT Ventricular Rate:  87 PR Interval:  175 QRS Duration: 79 QT Interval:  356 QTC Calculation: 429 R Axis:   41 Text Interpretation: Sinus rhythm Low voltage, precordial leads QRS now more narrow Confirmed by Davonna Belling [id number]) on 01/17/2021 9:30:40 PM   Radiology DG Chest Portable 1 View  Result Date: 01/17/2021 CLINICAL DATA:  Weakness EXAM: PORTABLE CHEST 1 VIEW COMPARISON:  Jan 31, 2020 FINDINGS: The heart size and mediastinal contours are within normal limits. Both lungs are clear. The visualized skeletal structures are unremarkable. IMPRESSION: No active disease. Electronically Signed   By: Constance Holster M.D.   On: 01/17/2021 20:40    Procedures Procedures   Medications Ordered in ED Medications  cefTRIAXone (ROCEPHIN) 1 g in sodium chloride 0.9 % 100 mL IVPB (has no administration in time range)  sodium chloride 0.9 % bolus 500 mL (0 mLs Intravenous Stopped 01/17/21 2307)    ED Course  I have reviewed the triage vital signs and the nursing notes.  Pertinent labs & imaging results that were available during my care of the patient were reviewed by me and considered in my medical decision making (see chart for details).    MDM Rules/Calculators/A&P                          Patient presents with generalized weakness.  Reportedly had low-grade temperature for EMS.  White count mildly elevated.  Patient has likely UTI.  Has had some urinary symptoms.  Has been too weak to get up at home.   Also confusion at home.  States that she also fell trying to walk.  With the combination of UTI and mental status changes I feels the patient would benefit from admission to the hospital.  Will discuss with hospitalist.  Does not appear septic at this time Final Clinical Impression(s) / ED Diagnoses Final diagnoses:  Lower urinary tract infectious disease    Rx / DC Orders ED Discharge Orders    None       Davonna Belling, MD 01/17/21 2323

## 2021-01-18 ENCOUNTER — Encounter (HOSPITAL_COMMUNITY): Payer: Self-pay | Admitting: Internal Medicine

## 2021-01-18 ENCOUNTER — Other Ambulatory Visit: Payer: Self-pay

## 2021-01-18 DIAGNOSIS — Z853 Personal history of malignant neoplasm of breast: Secondary | ICD-10-CM | POA: Diagnosis not present

## 2021-01-18 DIAGNOSIS — I10 Essential (primary) hypertension: Secondary | ICD-10-CM | POA: Diagnosis not present

## 2021-01-18 DIAGNOSIS — N1831 Chronic kidney disease, stage 3a: Secondary | ICD-10-CM | POA: Diagnosis not present

## 2021-01-18 DIAGNOSIS — N3001 Acute cystitis with hematuria: Secondary | ICD-10-CM | POA: Diagnosis not present

## 2021-01-18 LAB — COMPREHENSIVE METABOLIC PANEL
ALT: 26 U/L (ref 0–44)
AST: 24 U/L (ref 15–41)
Albumin: 3.6 g/dL (ref 3.5–5.0)
Alkaline Phosphatase: 51 U/L (ref 38–126)
Anion gap: 11 (ref 5–15)
BUN: 15 mg/dL (ref 8–23)
CO2: 22 mmol/L (ref 22–32)
Calcium: 9.1 mg/dL (ref 8.9–10.3)
Chloride: 108 mmol/L (ref 98–111)
Creatinine, Ser: 0.96 mg/dL (ref 0.44–1.00)
GFR, Estimated: >60 mL/min (ref >60)
Glucose, Bld: 130 mg/dL — ABNORMAL HIGH (ref 70–99)
Potassium: 3.7 mmol/L (ref 3.5–5.1)
Sodium: 141 mmol/L (ref 135–145)
Total Bilirubin: 2.5 mg/dL — ABNORMAL HIGH (ref 0.3–1.2)
Total Protein: 6.5 g/dL (ref 6.5–8.1)

## 2021-01-18 LAB — CBC
HCT: 39.1 % (ref 36.0–46.0)
Hemoglobin: 12.9 g/dL (ref 12.0–15.0)
MCH: 29.3 pg (ref 26.0–34.0)
MCHC: 33 g/dL (ref 30.0–36.0)
MCV: 88.9 fL (ref 80.0–100.0)
Platelets: 125 10*3/uL — ABNORMAL LOW (ref 150–400)
RBC: 4.4 MIL/uL (ref 3.87–5.11)
RDW: 13.5 % (ref 11.5–15.5)
WBC: 8.8 10*3/uL (ref 4.0–10.5)
nRBC: 0 % (ref 0.0–0.2)

## 2021-01-18 LAB — GLUCOSE, CAPILLARY
Glucose-Capillary: 132 mg/dL — ABNORMAL HIGH (ref 70–99)
Glucose-Capillary: 110 mg/dL — ABNORMAL HIGH (ref 70–99)
Glucose-Capillary: 112 mg/dL — ABNORMAL HIGH (ref 70–99)
Glucose-Capillary: 139 mg/dL — ABNORMAL HIGH (ref 70–99)

## 2021-01-18 LAB — HEMOGLOBIN A1C
Hgb A1c MFr Bld: 5.7 % — ABNORMAL HIGH (ref 4.8–5.6)
Mean Plasma Glucose: 116.89 mg/dL

## 2021-01-18 LAB — RESP PANEL BY RT-PCR (FLU A&B, COVID) ARPGX2
Influenza A by PCR: NEGATIVE
Influenza B by PCR: NEGATIVE
SARS Coronavirus 2 by RT PCR: NEGATIVE

## 2021-01-18 LAB — CBG MONITORING, ED: Glucose-Capillary: 132 mg/dL — ABNORMAL HIGH (ref 70–99)

## 2021-01-18 MED ORDER — SODIUM CHLORIDE 0.9 % IV SOLN: 1.0000 g | INTRAVENOUS | Status: DC

## 2021-01-18 MED ORDER — ASPIRIN 81 MG PO CHEW
81.0000 mg | CHEWABLE_TABLET | Freq: Every day | ORAL | Status: DC
  Administered 2021-01-18 – 2021-01-20 (×3): 81 mg via ORAL
  Filled 2021-01-18 (×3): qty 1

## 2021-01-18 MED ORDER — METFORMIN HCL 500 MG PO TABS: 1000.0000 mg | ORAL_TABLET | Freq: Two times a day (BID) | ORAL | Status: DC

## 2021-01-18 MED ORDER — VITAMIN B-12 5000 MCG SL SUBL: 5000.0000 ug | SUBLINGUAL_TABLET | Freq: Every day | SUBLINGUAL | Status: DC

## 2021-01-18 MED ORDER — VITAMIN B-12 1000 MCG PO TABS
5000.0000 ug | ORAL_TABLET | Freq: Every day | ORAL | Status: DC
  Administered 2021-01-18: 5000 ug via ORAL
  Filled 2021-01-18: qty 5

## 2021-01-18 MED ORDER — OMEGA-3-ACID ETHYL ESTERS 1 G PO CAPS
1.0000 g | ORAL_CAPSULE | Freq: Every day | ORAL | Status: DC
  Administered 2021-01-18 – 2021-01-20 (×3): 1 g via ORAL
  Filled 2021-01-18 (×3): qty 1

## 2021-01-18 MED ORDER — CARVEDILOL 25 MG PO TABS
25.0000 mg | ORAL_TABLET | Freq: Two times a day (BID) | ORAL | Status: DC
  Administered 2021-01-18 – 2021-01-20 (×5): 25 mg via ORAL
  Filled 2021-01-18 (×5): qty 1

## 2021-01-18 MED ORDER — AMLODIPINE BESYLATE 5 MG PO TABS
5.0000 mg | ORAL_TABLET | Freq: Every day | ORAL | Status: DC
  Administered 2021-01-18 – 2021-01-20 (×3): 5 mg via ORAL
  Filled 2021-01-18 (×3): qty 1

## 2021-01-18 MED ORDER — SODIUM CHLORIDE 0.9 % IV SOLN
1.0000 g | INTRAVENOUS | Status: DC
  Administered 2021-01-18 – 2021-01-19 (×2): 1 g via INTRAVENOUS
  Filled 2021-01-18 (×2): qty 1

## 2021-01-18 MED ORDER — ENOXAPARIN SODIUM 40 MG/0.4ML IJ SOSY
40.0000 mg | PREFILLED_SYRINGE | INTRAMUSCULAR | Status: DC
  Administered 2021-01-18 – 2021-01-19 (×2): 40 mg via SUBCUTANEOUS
  Filled 2021-01-18 (×3): qty 0.4

## 2021-01-18 MED ORDER — INSULIN ASPART 100 UNIT/ML IJ SOLN: 0.0000 [IU] | Freq: Three times a day (TID) | INTRAMUSCULAR | Status: DC

## 2021-01-18 MED ORDER — INSULIN ASPART 100 UNIT/ML IJ SOLN
0.0000 [IU] | Freq: Every day | INTRAMUSCULAR | Status: DC
Start: 2021-01-18 — End: 2021-01-20
  Filled 2021-01-18: qty 0.05

## 2021-01-18 MED ORDER — ADULT MULTIVITAMIN W/MINERALS CH
1.0000 | ORAL_TABLET | Freq: Every day | ORAL | Status: DC
  Administered 2021-01-18 – 2021-01-20 (×3): 1 via ORAL
  Filled 2021-01-18 (×3): qty 1

## 2021-01-18 MED ORDER — CARBIDOPA-LEVODOPA 25-100 MG PO TABS
1.0000 | ORAL_TABLET | Freq: Three times a day (TID) | ORAL | Status: DC
  Administered 2021-01-18 – 2021-01-20 (×7): 1 via ORAL
  Filled 2021-01-18 (×7): qty 1

## 2021-01-18 NOTE — ED Notes (Signed)
Does she need Airborne precautions since COVID test pending?

## 2021-01-18 NOTE — Evaluation (Signed)
Physical Therapy Evaluation Patient Details Name: [name] MRN: [id number] DOB: [birth year] Today's Date: 01/18/2021   History of Present Illness  HPI: [name] is a 80 y.o. female who presented with some mild altered mental status, global weakness and dysuria. Dx of UTI.  Pt with medical history significant of complicated UTI in the past, chronic kidney disease stage III, diabetes, GERD, essential hypertension, breast cancer, peripheral neuropathy, Parkinson's disease, B pelvic fractures in January 2022.  Clinical Impression  Pt admitted with above diagnosis. Pt ambulated 74' with RW, no loss of balance. Pt has h/o several falls in the past 6 months. Supervision for mobility is recommended.  Pt currently with functional limitations due to the deficits listed below (see PT Problem List). Pt will benefit from skilled PT to increase their independence and safety with mobility to allow discharge to the venue listed below.       Follow Up Recommendations Outpatient PT (pt was receiving OPPT for balance prior to admission); supervision for mobility    Equipment Recommendations  None recommended by PT    Recommendations for Other Services       Precautions / Restrictions Precautions Precautions: Fall Precaution Comments: fell in January with B pelvic fractures. 2 other falls in past 6 months Restrictions Weight Bearing Restrictions: No      Mobility  Bed Mobility Overal bed mobility: Needs Assistance Bed Mobility: Supine to Sit     Supine to sit: Mod assist     General bed mobility comments: assist to raise trunk    Transfers Overall transfer level: Needs assistance Equipment used: Rolling walker (2 wheeled) Transfers: Sit to/from Stand Sit to Stand: Min assist         General transfer comment: VCs for hand placement, min A to power up  Ambulation/Gait Ambulation/Gait assistance: Min guard Gait Distance (Feet): 90 Feet Assistive device: Rolling walker  (2 wheeled) Gait Pattern/deviations: Step-through pattern;Decreased stride length Gait velocity: decr   General Gait Details: min/guard A for safety 2* multiple falls in past 6 months, no loss of balance  Stairs            Wheelchair Mobility    Modified Rankin (Stroke Patients Only)       Balance Overall balance assessment: Needs assistance Sitting-balance support: Feet supported Sitting balance-Leahy Scale: Fair     Standing balance support: Bilateral upper extremity supported Standing balance-Leahy Scale: Poor Standing balance comment: relies on BUE support                             Pertinent Vitals/Pain Pain Assessment: No/denies pain    Home Living Family/patient expects to be discharged to:: Private residence Living Arrangements: Spouse/significant other Available Help at Discharge: Family;Available 24 hours/day Type of Home: House Home Access: Stairs to enter Entrance Stairs-Rails: Right Entrance Stairs-Number of Steps: 2 Home Layout: One level Home Equipment: Walker - 4 wheels;Shower seat;Toilet riser;Grab bars - tub/shower;Grab bars - toilet      Prior Function Level of Independence: Needs assistance   Gait / Transfers Assistance Needed: walks with rollator  ADL's / Homemaking Assistance Needed: assist for shower transers        Hand Dominance        Extremity/Trunk Assessment   Upper Extremity Assessment Upper Extremity Assessment: Overall WFL for tasks assessed    Lower Extremity Assessment Lower Extremity Assessment: Overall WFL for tasks assessed    Cervical / Trunk Assessment Cervical / Trunk  Assessment: Normal  Communication   Communication: No difficulties  Cognition Arousal/Alertness: Awake/alert Behavior During Therapy: WFL for tasks assessed/performed Overall Cognitive Status: Within Functional Limits for tasks assessed                                        General Comments       Exercises General Exercises - Lower Extremity Ankle Circles/Pumps: AROM;Both;10 reps;Supine   Assessment/Plan    PT Assessment Patient needs continued PT services  PT Problem List Decreased balance;Decreased activity tolerance       PT Treatment Interventions Gait training;Therapeutic exercise;Therapeutic activities;Patient/family education    PT Goals (Current goals can be found in the Care Plan section)  Acute Rehab PT Goals Patient Stated Goal: likes to read PT Goal Formulation: With patient/family Time For Goal Achievement: 02/01/21 Potential to Achieve Goals: Good    Frequency Min 3X/week   Barriers to discharge        Co-evaluation               AM-PAC PT "6 Clicks" Mobility  Outcome Measure Help needed turning from your back to your side while in a flat bed without using bedrails?: A Little Help needed moving from lying on your back to sitting on the side of a flat bed without using bedrails?: A Little Help needed moving to and from a bed to a chair (including a wheelchair)?: A Little Help needed standing up from a chair using your arms (e.g., wheelchair or bedside chair)?: A Little Help needed to walk in hospital room?: A Little Help needed climbing 3-5 steps with a railing? : A Little 6 Click Score: 18    End of Session Equipment Utilized During Treatment: Gait belt Activity Tolerance: Patient tolerated treatment well Patient left: in chair;with call bell/phone within reach;with chair alarm set Nurse Communication: Mobility status PT Visit Diagnosis: Difficulty in walking, not elsewhere classified (R26.2);History of falling (Z91.81)    Time: 5397-6734 PT Time Calculation (min) (ACUTE ONLY): 30 min   Charges:   PT Evaluation $PT Eval Moderate Complexity: 1 Mod PT Treatments $Gait Training: 8-22 mins   Blondell Reveal Kistler PT 01/18/2021  Acute Rehabilitation Services Pager [phone] Office [phone]         01/18/2021

## 2021-01-18 NOTE — ED Notes (Signed)
Per [facility], pt will remain in ED until COVID test results. If negative, pt can be moved to 3W. Charge RN made aware.

## 2021-01-18 NOTE — Progress Notes (Signed)
PROGRESS NOTE  DUCHESS ARMENDAREZ JOA:[id number] DOB: 25-Aug-1941 DOA: 01/17/2021 PCP: Binnie Rail, MD  HPI/Recap of past 24 hours: HPI from Dr Jacqualin Combes is a 80 y.o. female with medical history significant of complicated UTI in the past, chronic kidney disease stage III, diabetes, GERD, HTN, breast cancer, peripheral neuropathy, Parkinson's disease who presented with some mild altered mental status and dysuria.  Patient also has significant global weakness. Patient apparently has been using a walker since January where she had bilateral pelvic fractures.  She was seen and followed by Dr. Maureen Ralphs with the fracture fully healed but still having gait abnormalities. She has been going to outpatient rehab twice a week but has become progressively weaker this week, and not able to do much of her ADLs. Caregiver was worried so brought her to the ER for further evaluation. In the ED, VSS, labs showed WBC 12.6, urinalysis shows WBC 21-50 and rare bacteria. Given her age, profound weakness and hx of complicated UTI, pt was admitted for IV antibiotics and further management.    Today, patient still reports some generalized weakness, denies any abdominal pain, chest pain, shortness of breath, nausea/vomiting, fever/chills.     Assessment/Plan: Principal Problem:   UTI (urinary tract infection) Active Problems:   Type 2 diabetes mellitus with stage 3 chronic kidney disease, without long-term current use of insulin (HCC)   Essential hypertension   History of breast cancer   CKD (chronic kidney disease) stage 3, GFR 30-59 ml/min (HCC)   Paroxysmal atrial fibrillation (HCC)   Parkinson disease (HCC)    Possible UTI Currently afebrile, with no leukocytosis UA showed moderate leukocytosis, WBC 21-50, rare bacteria UC pending, BC x2 pending Continue IV ceftriaxone, pending culture report S/p IV fluids  Generalized weakness PT/OT  Hypertension BP stable Continue amlodipine,  Coreg  Diabetes mellitus type 2, well controlled Last A1c 5.7 on 01/17/2021 SSI, Accu-Cheks, hypoglycemic protocol  History of CKD stage IIIa Creatinine WNL, at baseline Daily BMP  Paroxysmal A. Fib Isolated occurrence during an admission with sepsis 2/2 UTI Follows with cardiology, no need for any anticoagulation  Parkinson's disease Continue Sinemet  History of breast Ca Outpatient follow-up  ??History of vitamin B12 deficiency Takes about 5000 mcg daily, last vitamin B12 level on file was in 2020 showed >1500 Repeat vitamin B12 level, if elevated Plan to either DC or reduce dose upon discharge    Estimated body mass index is 26.49 kg/m as calculated from the following:   Height as of this encounter: 5' 4.5" (1.638 m).   Weight as of this encounter: 71.1 kg.     Code Status: Full  Family Communication: None at bedside  Disposition Plan: Status is: Observation  The patient remains OBS appropriate and will d/c before 2 midnights.  Dispo: The patient is from: Home              Anticipated d/c is to: Home              Patient currently is not medically stable to d/c.   Difficult to place patient No    Consultants:  None  Procedures:  None  Antimicrobials:  Ceftriaxone  DVT prophylaxis: Lovenox   Objective: Vitals:   01/18/21 0415 01/18/21 0420 01/18/21 0547 01/18/21 0942  BP: (!) 158/75  (!) 157/79 135/65  Pulse: 89  91 82  Resp: 16  17 20   Temp: 98.2 F (36.8 C)  98.4 F (36.9 C) 98.5 F (36.9 C)  TempSrc:  Oral  Oral Oral  SpO2: 97%  98% 98%  Weight:  71.1 kg    Height:  5' 4.5" (1.638 m)      Intake/Output Summary (Last 24 hours) at 01/18/2021 1055 Last data filed at 01/18/2021 0957 Gross per 24 hour  Intake 1034.9 ml  Output 450 ml  Net 584.9 ml   Filed Weights   01/17/21 1832 01/18/21 0420  Weight: 73.5 kg 71.1 kg    Exam:  General: NAD   Cardiovascular: S1, S2 present  Respiratory: CTAB  Abdomen: Soft, nontender,  nondistended, bowel sounds present  Musculoskeletal: No bilateral pedal edema noted  Skin: Normal  Psychiatry: Normal mood    Data Reviewed: CBC: Recent Labs  Lab 01/17/21 2100 01/18/21 0540  WBC 12.6* 8.8  NEUTROABS 10.5*  --   HGB 13.2 12.9  HCT 39.3 39.1  MCV 87.5 88.9  PLT 141* 366*   Basic Metabolic Panel: Recent Labs  Lab 01/17/21 2100 01/18/21 0540  NA 141 141  K 3.9 3.7  CL 108 108  CO2 23 22  GLUCOSE 132* 130*  BUN 18 15  CREATININE 1.15* 0.96  CALCIUM 9.3 9.1   GFR: Estimated Creatinine Clearance: 46.5 mL/min (by C-G formula based on SCr of 0.96 mg/dL). Liver Function Tests: Recent Labs  Lab 01/17/21 2100 01/18/21 0540  AST 31 24  ALT 30 26  ALKPHOS 59 51  BILITOT 2.0* 2.5*  PROT 7.0 6.5  ALBUMIN 3.9 3.6   No results for input(s): LIPASE, AMYLASE in the last 168 hours. No results for input(s): AMMONIA in the last 168 hours. Coagulation Profile: No results for input(s): INR, PROTIME in the last 168 hours. Cardiac Enzymes: No results for input(s): CKTOTAL, CKMB, CKMBINDEX, TROPONINI in the last 168 hours. BNP (last 3 results) No results for input(s): PROBNP in the last 8760 hours. HbA1C: Recent Labs    01/17/21 2100  HGBA1C 5.7*   CBG: Recent Labs  Lab 01/18/21 0206 01/18/21 0735  GLUCAP 132* 132*   Lipid Profile: No results for input(s): CHOL, HDL, LDLCALC, TRIG, CHOLHDL, LDLDIRECT in the last 72 hours. Thyroid Function Tests: No results for input(s): TSH, T4TOTAL, FREET4, T3FREE, THYROIDAB in the last 72 hours. Anemia Panel: No results for input(s): VITAMINB12, FOLATE, FERRITIN, TIBC, IRON, RETICCTPCT in the last 72 hours. Urine analysis:    Component Value Date/Time   COLORURINE YELLOW 01/17/2021 2200   APPEARANCEUR CLEAR 01/17/2021 2200   LABSPEC 1.014 01/17/2021 2200   PHURINE 6.0 01/17/2021 2200   GLUCOSEU NEGATIVE 01/17/2021 2200   HGBUR SMALL (A) 01/17/2021 2200   HGBUR negative 11/17/2010 1626   BILIRUBINUR  NEGATIVE 01/17/2021 2200   KETONESUR 5 (A) 01/17/2021 2200   PROTEINUR NEGATIVE 01/17/2021 2200   UROBILINOGEN negative 11/17/2010 1626   NITRITE NEGATIVE 01/17/2021 2200   LEUKOCYTESUR MODERATE (A) 01/17/2021 2200   Sepsis Labs: @LABRCNTIP (procalcitonin:4,lacticidven:4)  ) Recent Results (from the past 240 hour(s))  Resp Panel by RT-PCR (Flu A&B, Covid)     Status: None   Collection Time: 01/17/21 11:21 PM  Result Value Ref Range Status   SARS Coronavirus 2 by RT PCR NEGATIVE NEGATIVE Final    Comment: (NOTE) SARS-CoV-2 target nucleic acids are NOT DETECTED.  The SARS-CoV-2 RNA is generally detectable in upper respiratory specimens during the acute phase of infection. The lowest concentration of SARS-CoV-2 viral copies this assay can detect is 138 copies/mL. A negative result does not preclude SARS-Cov-2 infection and should not be used as the sole basis for treatment or  other patient management decisions. A negative result may occur with  improper specimen collection/handling, submission of specimen other than nasopharyngeal swab, presence of viral mutation(s) within the areas targeted by this assay, and inadequate number of viral copies(<138 copies/mL). A negative result must be combined with clinical observations, patient history, and epidemiological information. The expected result is Negative.  Fact Sheet for Patients:  EntrepreneurPulse.com.au  Fact Sheet for Healthcare Providers:  IncredibleEmployment.be  This test is no t yet approved or cleared by the [place] FDA and  has been authorized for detection and/or diagnosis of SARS-CoV-2 by FDA under an Emergency Use Authorization (EUA). This EUA will remain  in effect (meaning this test can be used) for the duration of the COVID-19 declaration under Section 564(b)(1) of the Act, 21 U.S.C.section 360bbb-3(b)(1), unless the authorization is terminated  or revoked sooner.        Influenza A by PCR NEGATIVE NEGATIVE Final   Influenza B by PCR NEGATIVE NEGATIVE Final    Comment: (NOTE) The Xpert Xpress SARS-CoV-2/FLU/RSV plus assay is intended as an aid in the diagnosis of influenza from Nasopharyngeal swab specimens and should not be used as a sole basis for treatment. Nasal washings and aspirates are unacceptable for Xpert Xpress SARS-CoV-2/FLU/RSV testing.  Fact Sheet for Patients: EntrepreneurPulse.com.au  Fact Sheet for Healthcare Providers: IncredibleEmployment.be  This test is not yet approved or cleared by the [place] FDA and has been authorized for detection and/or diagnosis of SARS-CoV-2 by FDA under an Emergency Use Authorization (EUA). This EUA will remain in effect (meaning this test can be used) for the duration of the COVID-19 declaration under Section 564(b)(1) of the Act, 21 U.S.C. section 360bbb-3(b)(1), unless the authorization is terminated or revoked.  Performed at [facility], [place] [address], [place], [place] [address]       Studies: DG Chest Portable 1 View  Result Date: 01/17/2021 CLINICAL DATA:  Weakness EXAM: PORTABLE CHEST 1 VIEW COMPARISON:  Jan 31, 2020 FINDINGS: The heart size and mediastinal contours are within normal limits. Both lungs are clear. The visualized skeletal structures are unremarkable. IMPRESSION: No active disease. Electronically Signed   By: Constance Holster M.D.   On: 01/17/2021 20:40    Scheduled Meds: . amLODipine  5 mg Oral Daily  . aspirin  81 mg Oral Daily  . carbidopa-levodopa  1 tablet Oral TID  . carvedilol  25 mg Oral BID WC  . enoxaparin (LOVENOX) injection  40 mg Subcutaneous Q24H  . insulin aspart  0-15 Units Subcutaneous TID WC  . insulin aspart  0-5 Units Subcutaneous QHS  . multivitamin with minerals  1 tablet Oral Daily  . omega-3 acid ethyl esters  1 g Oral Daily  . vitamin B-12  5,000 mcg Oral Daily    Continuous  Infusions: . sodium chloride 100 mL/hr at 01/18/21 0757  . cefTRIAXone (ROCEPHIN)  IV       LOS: 0 days     Alma Friendly, MD [facility]  If 7PM-7AM, please contact night-coverage [email or web address] 01/18/2021, 10:55 AM

## 2021-01-19 ENCOUNTER — Ambulatory Visit: Payer: Medicare Other

## 2021-01-19 DIAGNOSIS — Z79899 Other long term (current) drug therapy: Secondary | ICD-10-CM | POA: Diagnosis not present

## 2021-01-19 DIAGNOSIS — I480 Paroxysmal atrial fibrillation: Secondary | ICD-10-CM | POA: Diagnosis present

## 2021-01-19 DIAGNOSIS — Z7982 Long term (current) use of aspirin: Secondary | ICD-10-CM | POA: Diagnosis not present

## 2021-01-19 DIAGNOSIS — Z20822 Contact with and (suspected) exposure to covid-19: Secondary | ICD-10-CM | POA: Diagnosis present

## 2021-01-19 DIAGNOSIS — R531 Weakness: Secondary | ICD-10-CM | POA: Diagnosis present

## 2021-01-19 DIAGNOSIS — E538 Deficiency of other specified B group vitamins: Secondary | ICD-10-CM | POA: Diagnosis present

## 2021-01-19 DIAGNOSIS — B961 Klebsiella pneumoniae [K. pneumoniae] as the cause of diseases classified elsewhere: Secondary | ICD-10-CM | POA: Diagnosis present

## 2021-01-19 DIAGNOSIS — M8580 Other specified disorders of bone density and structure, unspecified site: Secondary | ICD-10-CM | POA: Diagnosis present

## 2021-01-19 DIAGNOSIS — Z8041 Family history of malignant neoplasm of ovary: Secondary | ICD-10-CM | POA: Diagnosis not present

## 2021-01-19 DIAGNOSIS — N1831 Chronic kidney disease, stage 3a: Secondary | ICD-10-CM | POA: Diagnosis present

## 2021-01-19 DIAGNOSIS — K219 Gastro-esophageal reflux disease without esophagitis: Secondary | ICD-10-CM | POA: Diagnosis present

## 2021-01-19 DIAGNOSIS — Z803 Family history of malignant neoplasm of breast: Secondary | ICD-10-CM | POA: Diagnosis not present

## 2021-01-19 DIAGNOSIS — Z833 Family history of diabetes mellitus: Secondary | ICD-10-CM | POA: Diagnosis not present

## 2021-01-19 DIAGNOSIS — Z87891 Personal history of nicotine dependence: Secondary | ICD-10-CM | POA: Diagnosis not present

## 2021-01-19 DIAGNOSIS — G20 Parkinson's disease: Secondary | ICD-10-CM | POA: Diagnosis present

## 2021-01-19 DIAGNOSIS — Z823 Family history of stroke: Secondary | ICD-10-CM | POA: Diagnosis not present

## 2021-01-19 DIAGNOSIS — N3001 Acute cystitis with hematuria: Secondary | ICD-10-CM | POA: Diagnosis not present

## 2021-01-19 DIAGNOSIS — E1122 Type 2 diabetes mellitus with diabetic chronic kidney disease: Secondary | ICD-10-CM | POA: Diagnosis present

## 2021-01-19 DIAGNOSIS — I129 Hypertensive chronic kidney disease with stage 1 through stage 4 chronic kidney disease, or unspecified chronic kidney disease: Secondary | ICD-10-CM | POA: Diagnosis present

## 2021-01-19 DIAGNOSIS — Z9013 Acquired absence of bilateral breasts and nipples: Secondary | ICD-10-CM | POA: Diagnosis not present

## 2021-01-19 DIAGNOSIS — E1142 Type 2 diabetes mellitus with diabetic polyneuropathy: Secondary | ICD-10-CM | POA: Diagnosis present

## 2021-01-19 DIAGNOSIS — Z7984 Long term (current) use of oral hypoglycemic drugs: Secondary | ICD-10-CM | POA: Diagnosis not present

## 2021-01-19 DIAGNOSIS — N390 Urinary tract infection, site not specified: Secondary | ICD-10-CM | POA: Diagnosis present

## 2021-01-19 DIAGNOSIS — Z8249 Family history of ischemic heart disease and other diseases of the circulatory system: Secondary | ICD-10-CM | POA: Diagnosis not present

## 2021-01-19 DIAGNOSIS — M170 Bilateral primary osteoarthritis of knee: Secondary | ICD-10-CM | POA: Diagnosis present

## 2021-01-19 LAB — COMPREHENSIVE METABOLIC PANEL
ALT: <5 U/L (ref 0–44)
AST: 20 U/L (ref 15–41)
Albumin: 3.3 g/dL — ABNORMAL LOW (ref 3.5–5.0)
Alkaline Phosphatase: 48 U/L (ref 38–126)
Anion gap: 8 (ref 5–15)
BUN: 14 mg/dL (ref 8–23)
CO2: 23 mmol/L (ref 22–32)
Calcium: 8.9 mg/dL (ref 8.9–10.3)
Chloride: 111 mmol/L (ref 98–111)
Creatinine, Ser: 0.83 mg/dL (ref 0.44–1.00)
GFR, Estimated: >60 mL/min (ref >60)
Glucose, Bld: 117 mg/dL — ABNORMAL HIGH (ref 70–99)
Potassium: 3.5 mmol/L (ref 3.5–5.1)
Sodium: 142 mmol/L (ref 135–145)
Total Bilirubin: 1.3 mg/dL — ABNORMAL HIGH (ref 0.3–1.2)
Total Protein: 6 g/dL — ABNORMAL LOW (ref 6.5–8.1)

## 2021-01-19 LAB — CBC WITH DIFFERENTIAL/PLATELET
Abs Immature Granulocytes: 0.02 10*3/uL (ref 0.00–0.07)
Basophils Absolute: 0 10*3/uL (ref 0.0–0.1)
Basophils Relative: 0 %
Eosinophils Absolute: 0.1 10*3/uL (ref 0.0–0.5)
Eosinophils Relative: 2 %
HCT: 36.4 % (ref 36.0–46.0)
Hemoglobin: 12.1 g/dL (ref 12.0–15.0)
Immature Granulocytes: 0 %
Lymphocytes Relative: 25 %
Lymphs Abs: 1.8 10*3/uL (ref 0.7–4.0)
MCH: 29.4 pg (ref 26.0–34.0)
MCHC: 33.2 g/dL (ref 30.0–36.0)
MCV: 88.3 fL (ref 80.0–100.0)
Monocytes Absolute: 1.1 10*3/uL — ABNORMAL HIGH (ref 0.1–1.0)
Monocytes Relative: 14 %
Neutro Abs: 4.3 10*3/uL (ref 1.7–7.7)
Neutrophils Relative %: 59 %
Platelets: 121 10*3/uL — ABNORMAL LOW (ref 150–400)
RBC: 4.12 MIL/uL (ref 3.87–5.11)
RDW: 13.4 % (ref 11.5–15.5)
WBC: 7.3 10*3/uL (ref 4.0–10.5)
nRBC: 0 % (ref 0.0–0.2)

## 2021-01-19 LAB — GLUCOSE, CAPILLARY
Glucose-Capillary: 132 mg/dL — ABNORMAL HIGH (ref 70–99)
Glucose-Capillary: 119 mg/dL — ABNORMAL HIGH (ref 70–99)
Glucose-Capillary: 101 mg/dL — ABNORMAL HIGH (ref 70–99)
Glucose-Capillary: 88 mg/dL (ref 70–99)

## 2021-01-19 LAB — VITAMIN B12: Vitamin B-12: 1075 pg/mL — ABNORMAL HIGH (ref 180–914)

## 2021-01-19 NOTE — TOC Transition Note (Signed)
Transition of Care [facility]) - CM/SW Discharge Note  Patient Details  Name: [name] MRN: [id number] Date of Birth: 1941/08/09  [facility] CM/SW Contact:  Sherie Don, LCSW Phone Number: 01/19/2021, 10:41 AM  Clinical Narrative: Patient is expected to discharge home today. PT evaluation recommended OPPT. CSW met with patient to review recommendations. Per patient, she is already active with [facility] for OPPT. Patient has a rollator at home and does not have any additional DME needs at this time. TOC signing off.  Final next level of care: OP Rehab Barriers to Discharge: No Barriers Identified  Patient Goals and CMS Choice Patient states their goals for this hospitalization and ongoing recovery are:: Discharge home with OPPT through [facility] Medicare.gov Compare Post Acute Care list provided to:: Patient Choice offered to / list presented to : NA  Discharge Plan and Services      DME Arranged: N/A  Readmission Risk Interventions No flowsheet data found.

## 2021-01-19 NOTE — Progress Notes (Signed)
Physical Therapy Treatment Patient Details Name: [name] MRN: [id number] DOB: 05-07-1941 Today's Date: 01/19/2021    History of Present Illness HPI: [name] is a 80 y.o. female who presented with some mild altered mental status, global weakness and dysuria. Dx of UTI.  Pt with medical history significant of complicated UTI in the past, chronic kidney disease stage III, diabetes, GERD, essential hypertension, breast cancer, peripheral neuropathy, Parkinson's disease, B pelvic fractures in January 2022.    PT Comments    Min A for balance with sit to stand 2* posterior lean initially. Pt ambulated 130' with RW with no loss of balance. Close supervision to min guard assist recommended for mobility 2* fall risk.   Follow Up Recommendations  Outpatient PT;Supervision for mobility/OOB (pt was receiving OPPT for balance prior to admission)     Equipment Recommendations  None recommended by PT    Recommendations for Other Services       Precautions / Restrictions Precautions Precautions: Fall Precaution Comments: fell in January with B pelvic fractures. 2 other falls in past 6 months    Mobility  Bed Mobility Overal bed mobility: Modified Independent Bed Mobility: Supine to Sit     Supine to sit: Modified independent (Device/Increase time)     General bed mobility comments: used bed rail    Transfers Overall transfer level: Needs assistance Equipment used: Rolling walker (2 wheeled) Transfers: Sit to/from Stand Sit to Stand: Min assist;Mod assist         General transfer comment: verbal cues for hand placement, initial min to mod A to stand from edge of bed due to posterior lean  Ambulation/Gait Ambulation/Gait assistance: Min guard Gait Distance (Feet): 130 Feet Assistive device: Rolling walker (2 wheeled) Gait Pattern/deviations: Step-through pattern;Decreased stride length     General Gait Details: min/guard A for safety 2* multiple falls in  past 6 months, no loss of balance   Stairs             Wheelchair Mobility    Modified Rankin (Stroke Patients Only)       Balance Overall balance assessment: Needs assistance Sitting-balance support: Feet supported Sitting balance-Leahy Scale: Fair     Standing balance support: Bilateral upper extremity supported Standing balance-Leahy Scale: Poor Standing balance comment: relies on BUE support                            Cognition Arousal/Alertness: Awake/alert Behavior During Therapy: WFL for tasks assessed/performed Overall Cognitive Status: Within Functional Limits for tasks assessed                                        Exercises      General Comments        Pertinent Vitals/Pain Pain Assessment: No/denies pain    Home Living                      Prior Function            PT Goals (current goals can now be found in the care plan section) Acute Rehab PT Goals Patient Stated Goal: likes to read, continue OPPT PT Goal Formulation: With patient Time For Goal Achievement: 02/01/21 Potential to Achieve Goals: Good Progress towards PT goals: Progressing toward goals    Frequency    Min 3X/week  PT Plan Current plan remains appropriate    Co-evaluation              AM-PAC PT "6 Clicks" Mobility   Outcome Measure  Help needed turning from your back to your side while in a flat bed without using bedrails?: A Little Help needed moving from lying on your back to sitting on the side of a flat bed without using bedrails?: A Little Help needed moving to and from a bed to a chair (including a wheelchair)?: A Little Help needed standing up from a chair using your arms (e.g., wheelchair or bedside chair)?: A Little Help needed to walk in hospital room?: A Little Help needed climbing 3-5 steps with a railing? : A Little 6 Click Score: 18    End of Session Equipment Utilized During Treatment: Gait  belt Activity Tolerance: Patient tolerated treatment well Patient left: in chair;with call bell/phone within reach;with chair alarm set Nurse Communication: Mobility status PT Visit Diagnosis: Difficulty in walking, not elsewhere classified (R26.2);History of falling (Z91.81)     Time: 8299-3716 PT Time Calculation (min) (ACUTE ONLY): 11 min  Charges:  $Gait Training: 8-22 mins                    Blondell Reveal Kistler PT 01/19/2021  Acute Rehabilitation Services Pager [phone] Office [phone]

## 2021-01-19 NOTE — Care Management Obs Status (Signed)
[facility] NOTIFICATION   Patient Details  Name: MEHAR SAGEN MRN: [id number] Date of Birth: Jul 22, 1941   Medicare Observation Status Notification Given:  Yes    Sherie Don, LCSW 01/19/2021, 10:40 AM

## 2021-01-19 NOTE — Progress Notes (Signed)
PROGRESS NOTE    [name]  WNI:[id number] DOB: 12/14/1940 DOA: 01/17/2021 PCP: Binnie Rail, MD   Chief Complaint  Patient presents with  . Weakness    Brief Narrative:  Carol Pendelton Thompsonis Kebin Maye 80 y.o.femalewith medical history significant ofcomplicated UTI in the past, chronic kidney disease stage III, diabetes, GERD, HTN, breast cancer, peripheral neuropathy, Parkinson's disease who presented with some mild altered mental status and dysuria. Patient also has significant global weakness. Patient apparently has been using [name] walker since January where she had bilateral pelvic fractures. She was seen and followed by Dr. Virgel Manifold the fracture fully healed but still having gait abnormalities. She has been going to outpatient rehab twice [place] week but has become progressively weaker this week, and not able to do much of her ADLs. Caregiver was worried so brought her to the ER for further evaluation. In the ED, VSS, labs showed WBC 12.6, urinalysis shows WBC 21-50 and rare bacteria. Given her age, profound weakness and hx of complicated UTI, pt was admitted for IV antibiotics and further management.  Assessment & Plan:   Principal Problem:   UTI (urinary tract infection) Active Problems:   Type 2 diabetes mellitus with stage 3 chronic kidney disease, without long-term current use of insulin (HCC)   Essential hypertension   History of breast cancer   CKD (chronic kidney disease) stage 3, GFR 30-59 ml/min (HCC)   Paroxysmal atrial fibrillation (HCC)   Parkinson disease (HCC)  Klebsiella UTI  No blood cx collected Urine culture with klebsiella, sensitivities pending Continue ceftriaxone for now Improving leukocytosis, afebrile Discharge is pending final culture sensitivities  Generalized Weakness Outpatient PT recommended  Hypertension Amlodiine, coreg  T2DM A1c 5.7 SSI  CKD stage IIIa Follow  Paroxysmal Atrial Fibrillation Follows with cards, not on  anticoagulation as this presented in setting of urosepsis Follow outpatient with cards  Parkinson's Sinemet  Hx Breast Cancer  Hx B12 Deficiency Elevated B12, consider dose reduction at discharge  DVT prophylaxis: lovenox Code Status: full  Family Communication: none at bedside Disposition:   Status is: Observation  The patient will require care spanning > 2 midnights and should be moved to inpatient because: Inpatient level of care appropriate due to severity of illness  Dispo: The patient is from: Home              Anticipated d/c is to: Home              Patient currently is not medically stable to d/c.   Difficult to place patient No       Consultants:   none  Procedures:   none  Antimicrobials Anti-infectives (From admission, onward)   Start     Dose/Rate Route Frequency Ordered Stop   01/18/21 2200  cefTRIAXone (ROCEPHIN) 1 g in sodium chloride 0.9 % 100 mL IVPB        1 g 200 mL/hr over 30 Minutes Intravenous Every 24 hours 01/18/21 0139     01/18/21 0145  cefTRIAXone (ROCEPHIN) 1 g in sodium chloride 0.9 % 100 mL IVPB  Status:  Discontinued        1 g 200 mL/hr over 30 Minutes Intravenous Every 24 hours 01/18/21 0134 01/18/21 0138   01/17/21 2330  cefTRIAXone (ROCEPHIN) 1 g in sodium chloride 0.9 % 100 mL IVPB        1 g 200 mL/hr over 30 Minutes Intravenous  Once 01/17/21 2321 01/18/21 0136         Subjective:  Feeling better  Objective: Vitals:   01/18/21 1318 01/18/21 2042 01/19/21 0556 01/19/21 1423  BP: 139/68 (!) 133/53 (!) 169/67 (!) 128/57  Pulse: 79 66 81 76  Resp: 18 18 16 18   Temp: (!) 97.5 F (36.4 C) 98.1 F (36.7 C) 98 F (36.7 C) 98 F (36.7 C)  TempSrc: Oral Oral Oral   SpO2: 99% 99% 96% 99%  Weight:      Height:        Intake/Output Summary (Last 24 hours) at 01/19/2021 1900 Last data filed at 01/19/2021 0912 Gross per 24 hour  Intake 610 ml  Output 950 ml  Net -340 ml   Filed Weights   01/17/21 1832 01/18/21 0420   Weight: 73.5 kg 71.1 kg    Examination:  General exam: Appears calm and comfortable  Respiratory system: Clear to auscultation. Respiratory effort normal. Cardiovascular system: S1 & S2 heard, RRR.  Gastrointestinal system: Abdomen is nondistended, soft and nontender. Central nervous system: Alert and oriented. No focal neurological deficits. Extremities: no LEE Skin: No rashes, lesions or ulcers Psychiatry: Judgement and insight appear normal. Mood & affect appropriate.     Data Reviewed: I have personally reviewed following labs and imaging studies  CBC: Recent Labs  Lab 01/17/21 2100 01/18/21 0540 01/19/21 0314  WBC 12.6* 8.8 7.3  NEUTROABS 10.5*  --  4.3  HGB 13.2 12.9 12.1  HCT 39.3 39.1 36.4  MCV 87.5 88.9 88.3  PLT 141* 125* 121*    Basic Metabolic Panel: Recent Labs  Lab 01/17/21 2100 01/18/21 0540 01/19/21 0314  NA 141 141 142  K 3.9 3.7 3.5  CL 108 108 111  CO2 23 22 23   GLUCOSE 132* 130* 117*  BUN 18 15 14   CREATININE 1.15* 0.96 0.83  CALCIUM 9.3 9.1 8.9    GFR: Estimated Creatinine Clearance: 53.8 mL/min (by C-G formula based on SCr of 0.83 mg/dL).  Liver Function Tests: Recent Labs  Lab 01/17/21 2100 01/18/21 0540 01/19/21 0314  AST 31 24 20   ALT 30 26 <5  ALKPHOS 59 51 48  BILITOT 2.0* 2.5* 1.3*  PROT 7.0 6.5 6.0*  ALBUMIN 3.9 3.6 3.3*    CBG: Recent Labs  Lab 01/18/21 1633 01/18/21 2136 01/19/21 0735 01/19/21 1222 01/19/21 1641  GLUCAP 112* 139* 132* 119* 101*     Recent Results (from the past 240 hour(s))  Urine culture     Status: Abnormal (Preliminary result)   Collection Time: 01/17/21 10:00 PM   Specimen: Urine, Clean Catch  Result Value Ref Range Status   Specimen Description   Final    URINE, CLEAN CATCH Performed at [facility], [place] [address], [place], [place] [address]    Special Requests   Final    NONE Performed at [facility], [place] [address] [place],  [place] [address]    Culture (Acy Orsak)  Final    >=100,000 COLONIES/mL KLEBSIELLA PNEUMONIAE SUSCEPTIBILITIES TO FOLLOW Performed at [facility], [place] [address] [place], [place] [address]    Report Status PENDING  Incomplete  Resp Panel by RT-PCR (Flu [place] [facility]&B, Covid)     Status: None   Collection Time: 01/17/21 11:21 PM  Result Value Ref Range Status   SARS Coronavirus 2 by RT PCR NEGATIVE NEGATIVE Final    Comment: (NOTE) SARS-CoV-2 target nucleic acids are NOT DETECTED.  The SARS-CoV-2 RNA is generally detectable in upper respiratory specimens during the acute phase of infection. The lowest concentration of SARS-CoV-2 viral  copies this assay can detect is 138 copies/mL. Vicente Weidler negative result does not preclude SARS-Cov-2 infection and should not be used as the sole basis for treatment or other patient management decisions. Phil Michels negative result may occur with  improper specimen collection/handling, submission of specimen other than nasopharyngeal swab, presence of viral mutation(s) within the areas targeted by this assay, and inadequate number of viral copies(<138 copies/mL). Drayton Tieu negative result must be combined with clinical observations, patient history, and epidemiological information. The expected result is Negative.  Fact Sheet for Patients:  EntrepreneurPulse.com.au  Fact Sheet for Healthcare Providers:  IncredibleEmployment.be  This test is no t yet approved or cleared by the [place] FDA and  has been authorized for detection and/or diagnosis of SARS-CoV-2 by FDA under an Emergency Use Authorization (EUA). This EUA will remain  in effect (meaning this test can be used) for the duration of the COVID-19 declaration under Section 564(b)(1) of the Act, 21 U.S.C.section 360bbb-3(b)(1), unless the authorization is terminated  or revoked sooner.       Influenza Rasmus Preusser by PCR NEGATIVE NEGATIVE Final   Influenza B by PCR NEGATIVE NEGATIVE Final    Comment:  (NOTE) The Xpert Xpress SARS-CoV-2/FLU/RSV plus assay is intended as an aid in the diagnosis of influenza from Nasopharyngeal swab specimens and should not be used as Welford Christmas sole basis for treatment. Nasal washings and aspirates are unacceptable for Xpert Xpress SARS-CoV-2/FLU/RSV testing.  Fact Sheet for Patients: EntrepreneurPulse.com.au  Fact Sheet for Healthcare Providers: IncredibleEmployment.be  This test is not yet approved or cleared by the [place] FDA and has been authorized for detection and/or diagnosis of SARS-CoV-2 by FDA under an Emergency Use Authorization (EUA). This EUA will remain in effect (meaning this test can be used) for the duration of the COVID-19 declaration under Section 564(b)(1) of the Act, 21 U.S.C. section 360bbb-3(b)(1), unless the authorization is terminated or revoked.  Performed at [facility], [place] [address] [place],  [address]          Radiology Studies: DG Chest Portable 1 View  Result Date: 01/17/2021 CLINICAL DATA:  Weakness EXAM: PORTABLE CHEST 1 VIEW COMPARISON:  Jan 31, 2020 FINDINGS: The heart size and mediastinal contours are within normal limits. Both lungs are clear. The visualized skeletal structures are unremarkable. IMPRESSION: No active disease. Electronically Signed   By: Constance Holster M.D.   On: 01/17/2021 20:40        Scheduled Meds: . amLODipine  5 mg Oral Daily  . aspirin  81 mg Oral Daily  . carbidopa-levodopa  1 tablet Oral TID  . carvedilol  25 mg Oral BID WC  . enoxaparin (LOVENOX) injection  40 mg Subcutaneous Q24H  . insulin aspart  0-5 Units Subcutaneous QHS  . insulin aspart  0-9 Units Subcutaneous TID WC  . multivitamin with minerals  1 tablet Oral Daily  . omega-3 acid ethyl esters  1 g Oral Daily   Continuous Infusions: . cefTRIAXone (ROCEPHIN)  IV 1 g (01/18/21 2202)     LOS: 0 days    Time spent: over 30 min    Fayrene Helper, MD [facility]   To contact the attending provider between 7A-7P or the covering provider during after hours 7P-7A, please log into the web site [email or web address] and access using universal [place] password for that web site. If you do not have the password, please call the hospital operator.  01/19/2021, 7:00 PM

## 2021-01-19 NOTE — Evaluation (Signed)
Occupational Therapy Evaluation Patient Details Name: [name] MRN: [id number] DOB: 10-22-1940 Today's Date: 01/19/2021    History of Present Illness HPI: [name] is a 80 y.o. female who presented with some mild altered mental status, global weakness and dysuria. Dx of UTI.  Pt with medical history significant of complicated UTI in the past, chronic kidney disease stage III, diabetes, GERD, essential hypertension, breast cancer, peripheral neuropathy, Parkinson's disease, B pelvic fractures in January 2022.   Clinical Impression   Patient lives with spouse in a single level house with 2 steps to enter, uses rollator for ambulation. At baseline patient has assist with shower transfers. Currently patient presents with decreased balance and safety needing initial mod A to stand from edge of bed due to strong posterior lean. Patient min A with ambulation to bathroom with walker and mod A to power up to standing from low toilet. Patient needs min cues for safe hand placement and with clothing management during toileting. Recommend continued acute OT services to maximize patient safety, balance, activity tolerance to reduce caregiver burden and facilitate D/C home.    Follow Up Recommendations  Supervision/Assistance - 24 hour    Equipment Recommendations  None recommended by OT       Precautions / Restrictions Precautions Precautions: Fall Precaution Comments: fell in January with B pelvic fractures. 2 other falls in past 6 months Restrictions Weight Bearing Restrictions: No      Mobility Bed Mobility Overal bed mobility: Needs Assistance Bed Mobility: Supine to Sit     Supine to sit: Min assist     General bed mobility comments: to raise trunk    Transfers Overall transfer level: Needs assistance Equipment used: Rolling walker (2 wheeled) Transfers: Sit to/from Stand Sit to Stand: Min assist;Mod assist         General transfer comment: verbal cues for  hand placement, initial mod A to stand from edge of bed due to posterior lean    Balance Overall balance assessment: Needs assistance Sitting-balance support: Feet supported Sitting balance-Leahy Scale: Fair     Standing balance support: Bilateral upper extremity supported Standing balance-Leahy Scale: Poor Standing balance comment: relies on BUE support                           ADL either performed or assessed with clinical judgement   ADL Overall ADL's : Needs assistance/impaired Eating/Feeding: Independent;Sitting   Grooming: Wash/dry face;Wash/dry hands;Supervision/safety;Standing   Upper Body Bathing: Set up;Sitting   Lower Body Bathing: Minimal assistance;Sitting/lateral leans;Sit to/from stand   Upper Body Dressing : Set up;Sitting   Lower Body Dressing: Minimal assistance;Sitting/lateral leans;Sit to/from stand Lower Body Dressing Details (indicate cue type and reason): to manage clothing seated on toilet Toilet Transfer: Minimal assistance;Moderate assistance;Ambulation;Cueing for safety;RW;Regular Glass blower/designer Details (indicate cue type and reason): min A to ambulate to bathroom, mod A to power up to standing from lower toilet Toileting- Clothing Manipulation and Hygiene: Minimal assistance;Sitting/lateral lean;Sit to/from stand Toileting - Clothing Manipulation Details (indicate cue type and reason): with increased time patient able to doff mesh underwear, min A to thread clean pair     Functional mobility during ADLs: Minimal assistance;Rolling walker;Cueing for safety                    Pertinent Vitals/Pain Pain Assessment: Faces Faces Pain Scale: Hurts a little bit Pain Location: R leg Pain Descriptors / Indicators: Other (Comment) (stiff) Pain Intervention(s): Monitored  during session     Hand Dominance  (did not specify)   Extremity/Trunk Assessment Upper Extremity Assessment Upper Extremity Assessment: Overall WFL for  tasks assessed   Lower Extremity Assessment Lower Extremity Assessment: Defer to PT evaluation       Communication Communication Communication: No difficulties   Cognition Arousal/Alertness: Awake/alert Behavior During Therapy: WFL for tasks assessed/performed Overall Cognitive Status: Within Functional Limits for tasks assessed                                                Home Living Family/patient expects to be discharged to:: Private residence Living Arrangements: Spouse/significant other Available Help at Discharge: Family;Available 24 hours/day Type of Home: House Home Access: Stairs to enter [employer] of Steps: 2 Entrance Stairs-Rails: Right Home Layout: One level     Bathroom Shower/Tub: Teacher, early years/pre: Handicapped height     Home Equipment: Environmental consultant - 4 wheels;Shower seat;Toilet riser;Grab bars - tub/shower;Grab bars - toilet          Prior Functioning/Environment Level of Independence: Needs assistance  Gait / Transfers Assistance Needed: walks with rollator ADL's / Homemaking Assistance Needed: assist for shower transfers            OT Problem List: Impaired balance (sitting and/or standing);Decreased safety awareness;Decreased activity tolerance      OT Treatment/Interventions: Self-care/ADL training;Balance training;Patient/family education;DME and/or AE instruction;Therapeutic activities    OT Goals(Current goals can be found in the care plan section) Acute Rehab OT Goals Patient Stated Goal: continue OP PT OT Goal Formulation: With patient Time For Goal Achievement: 02/02/21 Potential to Achieve Goals: Good  OT Frequency: Min 2X/week    AM-PAC OT "6 Clicks" Daily Activity     Outcome Measure Help from another person eating meals?: None Help from another person taking care of personal grooming?: A Little Help from another person toileting, which includes using toliet, bedpan, or urinal?: A  Lot Help from another person bathing (including washing, rinsing, drying)?: A Little Help from another person to put on and taking off regular upper body clothing?: A Little Help from another person to put on and taking off regular lower body clothing?: A Little 6 Click Score: 18   End of Session Equipment Utilized During Treatment: Rolling walker Nurse Communication: Mobility status  Activity Tolerance: Patient tolerated treatment well Patient left: in chair;with call bell/phone within reach;with chair alarm set  OT Visit Diagnosis: History of falling (Z91.81)                Time: 2993-7169 OT Time Calculation (min): 14 min Charges:  OT General Charges $OT Visit: 1 Visit OT Evaluation $OT Eval Low Complexity: 1 Low  Delbert Phenix OT OT pager: [place] 01/19/2021, 10:13 AM

## 2021-01-20 DIAGNOSIS — N3001 Acute cystitis with hematuria: Secondary | ICD-10-CM | POA: Diagnosis not present

## 2021-01-20 LAB — URINE CULTURE: Culture: >=100000 — AB

## 2021-01-20 LAB — COMPREHENSIVE METABOLIC PANEL
ALT: <5 U/L (ref 0–44)
AST: 26 U/L (ref 15–41)
Albumin: 3.1 g/dL — ABNORMAL LOW (ref 3.5–5.0)
Alkaline Phosphatase: 51 U/L (ref 38–126)
Anion gap: 10 (ref 5–15)
BUN: 19 mg/dL (ref 8–23)
CO2: 24 mmol/L (ref 22–32)
Calcium: 8.7 mg/dL — ABNORMAL LOW (ref 8.9–10.3)
Chloride: 109 mmol/L (ref 98–111)
Creatinine, Ser: 1.16 mg/dL — ABNORMAL HIGH (ref 0.44–1.00)
GFR, Estimated: 48 mL/min — ABNORMAL LOW (ref >60)
Glucose, Bld: 104 mg/dL — ABNORMAL HIGH (ref 70–99)
Potassium: 3.6 mmol/L (ref 3.5–5.1)
Sodium: 143 mmol/L (ref 135–145)
Total Bilirubin: 0.7 mg/dL (ref 0.3–1.2)
Total Protein: 6.1 g/dL — ABNORMAL LOW (ref 6.5–8.1)

## 2021-01-20 LAB — CBC WITH DIFFERENTIAL/PLATELET
Abs Immature Granulocytes: 0.02 10*3/uL (ref 0.00–0.07)
Basophils Absolute: 0 10*3/uL (ref 0.0–0.1)
Basophils Relative: 0 %
Eosinophils Absolute: 0.2 10*3/uL (ref 0.0–0.5)
Eosinophils Relative: 3 %
HCT: 36.3 % (ref 36.0–46.0)
Hemoglobin: 12 g/dL (ref 12.0–15.0)
Immature Granulocytes: 0 %
Lymphocytes Relative: 26 %
Lymphs Abs: 1.6 10*3/uL (ref 0.7–4.0)
MCH: 29.1 pg (ref 26.0–34.0)
MCHC: 33.1 g/dL (ref 30.0–36.0)
MCV: 87.9 fL (ref 80.0–100.0)
Monocytes Absolute: 0.8 10*3/uL (ref 0.1–1.0)
Monocytes Relative: 14 %
Neutro Abs: 3.5 10*3/uL (ref 1.7–7.7)
Neutrophils Relative %: 57 %
Platelets: 139 10*3/uL — ABNORMAL LOW (ref 150–400)
RBC: 4.13 MIL/uL (ref 3.87–5.11)
RDW: 13.5 % (ref 11.5–15.5)
WBC: 6.1 10*3/uL (ref 4.0–10.5)
nRBC: 0 % (ref 0.0–0.2)

## 2021-01-20 LAB — GLUCOSE, CAPILLARY
Glucose-Capillary: 117 mg/dL — ABNORMAL HIGH (ref 70–99)
Glucose-Capillary: 100 mg/dL — ABNORMAL HIGH (ref 70–99)

## 2021-01-20 LAB — PHOSPHORUS: Phosphorus: 3.4 mg/dL (ref 2.5–4.6)

## 2021-01-20 LAB — MAGNESIUM: Magnesium: 1.7 mg/dL (ref 1.7–2.4)

## 2021-01-20 MED ORDER — CEPHALEXIN 250 MG PO CAPS: 250.0000 mg | ORAL_CAPSULE | Freq: Four times a day (QID) | ORAL | 0 refills | Status: DC

## 2021-01-20 NOTE — Plan of Care (Signed)
  Problem: Pain Managment: Goal: General experience of comfort will improve Outcome: Progressing   Problem: Safety: Goal: Ability to remain free from injury will improve Outcome: Progressing   

## 2021-01-20 NOTE — Discharge Summary (Signed)
Physician Discharge Summary  [name] TFT:[id number] DOB: 07/13/1941 DOA: 01/17/2021  PCP: Binnie Rail, MD  Admit date: 01/17/2021 Discharge date: 01/20/2021  Time spent: 40 minutes  Recommendations for Outpatient Follow-up:  1. Follow outpatient CBC/CMP 2. Follow b12 supplementation - high dose? Defer to PCP for adjustment   Discharge Diagnoses:  Principal Problem:   UTI (urinary tract infection) Active Problems:   Type 2 diabetes mellitus with stage 3 chronic kidney disease, without long-term current use of insulin (HCC)   Essential hypertension   History of breast cancer   CKD (chronic kidney disease) stage 3, GFR 30-59 ml/min (HCC)   Paroxysmal atrial fibrillation (HCC)   Parkinson disease ([place])   Discharge Condition: stable  Diet recommendation: diabetic diet  Filed Weights   01/17/21 1832 01/18/21 0420  Weight: 73.5 kg 71.1 kg    History of present illness:  Carol Esquilin Thompsonis [name] 80 y.o.femalewith medical history significant ofcomplicated UTI in the past, chronic kidney disease stage III, diabetes, GERD,HTN,breast cancer, peripheral neuropathy, Parkinson's disease who presented with some mild altered mental status and dysuria. Patient also has significant global weakness. Patient apparently has been using [name] walker since January where she had bilateral pelvic fractures. She was seen and followed by Dr. Virgel Meyers the fracture fully healed but still having gait abnormalities. She has been going to outpatient rehab twice [name] week but has become progressively weaker this week, and not able to do much of her ADLs. Caregiver was worried so brought her to the ER forfurtherevaluation. In the ED, VSS, labs showed WBC 12.6, urinalysis shows WBC 21-50 and rare bacteria.Givenher age,profound weaknessand hx of complicated UTI, pt wasadmitted for IV antibiotics andfurther management.  She's improved with IV antibiotics and is discharged on 5/5 in stable condition on  culture directed antibiotics.  See below for additional details  Hospital Course:  Klebsiella UTI  No blood cx collected Urine culture with klebsiella, sensitive to cefazolin -> discharge on keflex Continue ceftriaxone for now Improving leukocytosis, afebrile  Generalized Weakness Outpatient PT recommended  Hypertension Amlodiine, coreg  T2DM A1c 5.7 SSI  CKD stage IIIa Follow  Paroxysmal Atrial Fibrillation Follows with cards, not on anticoagulation as this presented in setting of urosepsis Follow outpatient with cards  Parkinson's Sinemet  Hx Breast Cancer  Hx B12 Deficiency Elevated B12, consider dose reduction at discharge  Procedures:  none  Consultations:  None   Discharge Exam: Vitals:   01/19/21 1948 01/20/21 0534  BP: (!) 145/65 (!) 154/68  Pulse: 85 75  Resp: 18 18  Temp: 98 F (36.7 C) 98.2 F (36.8 C)  SpO2: 97% 99%   Feeling well Eager to d/c home  General: No acute distress. Cardiovascular: Heart sounds show [name] regular rate, and rhythm. Lungs: Clear to auscultation bilaterally Abdomen: Soft, nontender, nondistended Neurological: Alert and oriented 3. Moves all extremities 4. Cranial nerves II through XII grossly intact. Skin: Warm and dry. No rashes or lesions. Extremities: No clubbing or cyanosis. No edema.   Discharge Instructions   Discharge Instructions    Call MD for:  difficulty breathing, headache or visual disturbances   Complete by: As directed    Call MD for:  extreme fatigue   Complete by: As directed    Call MD for:  hives   Complete by: As directed    Call MD for:  persistant dizziness or light-headedness   Complete by: As directed    Call MD for:  persistant nausea and vomiting   Complete by:  As directed    Call MD for:  redness, tenderness, or signs of infection (pain, swelling, redness, odor or green/yellow discharge around incision site)   Complete by: As directed    Call MD for:  severe  uncontrolled pain   Complete by: As directed    Call MD for:  temperature >100.4   Complete by: As directed    Diet - low sodium heart healthy   Complete by: As directed    Discharge instructions   Complete by: As directed    You were seen for [name] UTI.   You've improved with antibiotics.  We'll discharge you with an additional 4 days of antibiotics.  Please follow up with your PCP within [name] week for repeat labs and follow up of your hospitalization.  You're on [name] high dose of B12, please follow up with your PCP whether this can be reduced.  Resume your outpatient therapy.  Return for new, recurrent, or worsening symptoms.  Please ask your PCP to request records from this hospitalization so they know what was done and what the next steps will be.   Increase activity slowly   Complete by: As directed      Allergies as of 01/20/2021      Reactions   Elemental Sulfur    Flushed, funny feeling in throat    Exemestane Nausea Only   Other reaction(s): Dizziness (intolerance)   Morphine And Related    Statins Other (See Comments)   Elevated LFTs   Ace Inhibitors    REACTION: COUGH   Codeine    REACTION: VOMITTING      Medication List    TAKE these medications   amLODipine 5 MG tablet Commonly known as: NORVASC TAKE 1 TABLET ONCE DAILY.   aspirin 81 MG chewable tablet Chew 1 tablet (81 mg total) by mouth daily.   carbidopa-levodopa 25-100 MG tablet Commonly known as: SINEMET IR TAKE 1 TABLET BY MOUTH 3 TIMES [name] DAY. What changed:   how much to take  how to take this  when to take this  additional instructions   carvedilol 25 MG tablet Commonly known as: COREG TAKE 1 TABLET BY MOUTH TWICE DAILY WITH Melisha Eggleton MEAL.   cephALEXin 250 MG capsule Commonly known as: KEFLEX Take 1 capsule (250 mg total) by mouth 4 (four) times daily for 4 days.   Fish Oil Concentrate 1000 MG Caps Take 1,000 mg by mouth 2 (two) times daily.   metFORMIN 500 MG tablet Commonly known as:  GLUCOPHAGE TAKE 2 TABLETS TWICE [name] DAY WITH MEALS. What changed:   how much to take  how to take this  when to take this  additional instructions   multivitamin with minerals Tabs tablet Take 1 tablet by mouth daily.   Vitamin B-12 5000 MCG Subl Place 5,000 mcg under the tongue daily.      Allergies  Allergen Reactions  . Elemental Sulfur     Flushed, funny feeling in throat   . Exemestane Nausea Only    Other reaction(s): Dizziness (intolerance)  . Morphine And Related   . Statins Other (See Comments)    Elevated LFTs  . Ace Inhibitors     REACTION: COUGH  . Codeine     REACTION: VOMITTING      The results of significant diagnostics from this hospitalization (including imaging, microbiology, ancillary and laboratory) are listed below for reference.    Significant Diagnostic Studies: DG Chest Portable 1 View  Result Date: 01/17/2021 CLINICAL DATA:  Weakness EXAM: PORTABLE CHEST 1 VIEW COMPARISON:  Jan 31, 2020 FINDINGS: The heart size and mediastinal contours are within normal limits. Both lungs are clear. The visualized skeletal structures are unremarkable. IMPRESSION: No active disease. Electronically Signed   By: Constance Holster M.D.   On: 01/17/2021 20:40    Microbiology: Recent Results (from the past 240 hour(s))  Urine culture     Status: Abnormal   Collection Time: 01/17/21 10:00 PM   Specimen: Urine, Clean Catch  Result Value Ref Range Status   Specimen Description   Final    URINE, CLEAN CATCH Performed at [facility], [place] [address] [place], [place] [address]    Special Requests   Final    NONE Performed at [facility], [place] [address], [place], [place] [address]    Culture >=100,000 COLONIES/mL KLEBSIELLA PNEUMONIAE (Adonis Yim)  Final   Report Status 01/20/2021 FINAL  Final   Organism ID, Bacteria KLEBSIELLA PNEUMONIAE (Latrell Potempa)  Final      Susceptibility   Klebsiella pneumoniae - MIC*    AMPICILLIN RESISTANT  Resistant     CEFAZOLIN <=4 SENSITIVE Sensitive     CEFEPIME <=0.12 SENSITIVE Sensitive     CEFTRIAXONE <=0.25 SENSITIVE Sensitive     CIPROFLOXACIN <=0.25 SENSITIVE Sensitive     GENTAMICIN <=1 SENSITIVE Sensitive     IMIPENEM <=0.25 SENSITIVE Sensitive     NITROFURANTOIN 64 INTERMEDIATE Intermediate     TRIMETH/SULFA <=20 SENSITIVE Sensitive     AMPICILLIN/SULBACTAM <=2 SENSITIVE Sensitive     PIP/TAZO <=4 SENSITIVE Sensitive     * >=100,000 COLONIES/mL KLEBSIELLA PNEUMONIAE  Resp Panel by RT-PCR (Flu Kania Regnier&B, Covid)     Status: None   Collection Time: 01/17/21 11:21 PM  Result Value Ref Range Status   SARS Coronavirus 2 by RT PCR NEGATIVE NEGATIVE Final    Comment: (NOTE) SARS-CoV-2 target nucleic acids are NOT DETECTED.  The SARS-CoV-2 RNA is generally detectable in upper respiratory specimens during the acute phase of infection. The lowest concentration of SARS-CoV-2 viral copies this assay can detect is 138 copies/mL. Harrel Ferrone negative result does not preclude SARS-Cov-2 infection and should not be used as the sole basis for treatment or other patient management decisions. Even Budlong negative result may occur with  improper specimen collection/handling, submission of specimen other than nasopharyngeal swab, presence of viral mutation(s) within the areas targeted by this assay, and inadequate number of viral copies(<138 copies/mL). Rowe Warman negative result must be combined with clinical observations, patient history, and epidemiological information. The expected result is Negative.  Fact Sheet for Patients:  EntrepreneurPulse.com.au  Fact Sheet for Healthcare Providers:  IncredibleEmployment.be  This test is no t yet approved or cleared by the [place] FDA and  has been authorized for detection and/or diagnosis of SARS-CoV-2 by FDA under an Emergency Use Authorization (EUA). This EUA will remain  in effect (meaning this test can be used) for the duration of  the COVID-19 declaration under Section 564(b)(1) of the Act, 21 U.S.C.section 360bbb-3(b)(1), unless the authorization is terminated  or revoked sooner.       Influenza Abi Shoults by PCR NEGATIVE NEGATIVE Final   Influenza B by PCR NEGATIVE NEGATIVE Final    Comment: (NOTE) The Xpert Xpress SARS-CoV-2/FLU/RSV plus assay is intended as an aid in the diagnosis of influenza from Nasopharyngeal swab specimens and should not be used as Martise Waddell sole basis for treatment. Nasal washings and aspirates are unacceptable for Xpert Xpress SARS-CoV-2/FLU/RSV testing.  Fact Sheet for Patients: EntrepreneurPulse.com.au  Fact Sheet for Healthcare Providers: IncredibleEmployment.be  This test is not yet approved or cleared by the [place] FDA and has been authorized for detection and/or diagnosis of SARS-CoV-2 by FDA under an Emergency Use Authorization (EUA). This EUA will remain in effect (meaning this test can be used) for the duration of the COVID-19 declaration under Section 564(b)(1) of the Act, 21 U.S.C. section 360bbb-3(b)(1), unless the authorization is terminated or revoked.  Performed at [facility], [place] [address] [place], [place] [address]      Labs: Basic Metabolic Panel: Recent Labs  Lab 01/17/21 2100 01/18/21 0540 01/19/21 0314 01/20/21 0329  NA 141 141 142 143  K 3.9 3.7 3.5 3.6  CL 108 108 111 109  CO2 23 22 23 24   GLUCOSE 132* 130* 117* 104*  BUN 18 15 14 19   CREATININE 1.15* 0.96 0.83 1.16*  CALCIUM 9.3 9.1 8.9 8.7*  MG  --   --   --  1.7  PHOS  --   --   --  3.4   Liver Function Tests: Recent Labs  Lab 01/17/21 2100 01/18/21 0540 01/19/21 0314 01/20/21 0329  AST 31 24 20 26   ALT 30 26 <5 <5  ALKPHOS 59 51 48 51  BILITOT 2.0* 2.5* 1.3* 0.7  PROT 7.0 6.5 6.0* 6.1*  ALBUMIN 3.9 3.6 3.3* 3.1*   No results for input(s): LIPASE, AMYLASE in the last 168 hours. No results for input(s): AMMONIA in the last  168 hours. CBC: Recent Labs  Lab 01/17/21 2100 01/18/21 0540 01/19/21 0314 01/20/21 0329  WBC 12.6* 8.8 7.3 6.1  NEUTROABS 10.5*  --  4.3 3.5  HGB 13.2 12.9 12.1 12.0  HCT 39.3 39.1 36.4 36.3  MCV 87.5 88.9 88.3 87.9  PLT 141* 125* 121* 139*   Cardiac Enzymes: No results for input(s): CKTOTAL, CKMB, CKMBINDEX, TROPONINI in the last 168 hours. BNP: BNP (last 3 results) Recent Labs    02/04/20 0457 02/05/20 0432 02/06/20 0838  BNP 580.1* 655.4* 302.6*    ProBNP (last 3 results) No results for input(s): PROBNP in the last 8760 hours.  CBG: Recent Labs  Lab 01/19/21 1222 01/19/21 1641 01/19/21 2221 01/20/21 0729 01/20/21 1106  GLUCAP 119* 101* 88 117* 100*       Signed:  Fayrene Helper MD.  [facility] 01/20/2021, 3:22 PM

## 2021-01-22 ENCOUNTER — Other Ambulatory Visit: Payer: Self-pay | Admitting: Internal Medicine

## 2021-01-22 ENCOUNTER — Other Ambulatory Visit: Payer: Self-pay | Admitting: Neurology

## 2021-01-24 NOTE — Patient Instructions (Addendum)
  Blood work and urine tests were ordered.     Medications changes include :  Start cranberry-probiotic supplement     A referral was ordered for urology.      Someone from their office will call you to schedule an appointment.     Please followup in 6 months

## 2021-01-24 NOTE — Telephone Encounter (Signed)
Last OV--10/11/18 Last refill--01/16/19  Pt does not have  f/u appt set up. Please advise

## 2021-01-24 NOTE — Progress Notes (Signed)
Subjective:    Patient ID: [name], female    DOB: May 26, 1941, 80 y.o.   MRN: [id number]  HPI The patient is here for follow up from the hospital.   Admitted 01/17/21 - 01/20/21 for UTI  Presented with AMS and dysuria and global   Weakness.  At home she had low BP, fever and confusion per her husband.  Had b/l pelvic fx in Jan '22 - uses a walker.  Doing PT and getting weaker, was not able to do much of her ADL's.  Caregiver brought her to ED - elevated wbc, ua showed uti.  She was admitted, had IV abx and clinically improved - d/c back to home  UTI, Klebsiella.  Improved.  Was on ceftriaxone, d/c'd on keflex.  She completed this yesterday.    gen weakness:  Continue outpatient PT  Paroxysmal Afib: in setting of urosepsis, not started on a/c To f/u with cardiology  Htn, DM, CKD, Parkinson's: stable.    She completed the antibiotic yesterday.  She has been feeling well.  Today she has started to feel more fatigued. She denies urinary symptoms and denies ever having any urine symptoms.  She denies fever, chills.  Her appetite is good.    She has no concerns.    Medications and allergies reviewed with patient and updated if appropriate.  Patient Active Problem List   Diagnosis Date Noted  . UTI (urinary tract infection) 01/17/2021  . Aortic atherosclerosis (HCC) 08/27/2020  . 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 Parkinson disease (HCC) 08/26/2020  . Paroxysmal atrial fibrillation (HCC) 02/25/2020  . Complicated UTI (urinary tract infection) 01/31/2020  . CKD (chronic kidney disease) stage 3, GFR 30-59 ml/min (HCC) 01/31/2020  . Hypokalemia due to inadequate potassium intake 01/31/2020  . Severe sepsis with acute organ dysfunction due to Gram negative bacteria (HCC) 01/31/2020  . Episodic lightheadedness 08/27/2019  . Dizziness 02/24/2019  . Peripheral neuropathy 08/23/2018  . Poor balance 02/20/2018  . Genetic testing 09/04/2017  . Family history of breast cancer    . Family history of ovarian cancer   . Fatty liver 08/05/2015  . History of breast cancer 08/04/2015  . S/P bilateral mastectomy 08/04/2015  . Osteoarthritis of left knee 08/04/2015  . Elevated LFTs 08/10/2014  . Osteopenia 08/31/2013  . Transfusion history 08/18/2013  . IBS (irritable bowel syndrome) 08/18/2013  . 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 Joint pain 09/02/2010  . History of colonic polyps 06/06/2010  . Essential hypertension 02/16/2009  . Type 2 diabetes mellitus with stage 3 chronic kidney disease, without long-term current use of insulin (HCC) 08/11/2008  . Hypertriglyceridemia 09/30/2007  . GILBERT'S SYNDROME 02/06/2007    Current Outpatient Medications on File Prior to Visit  Medication Sig Dispense Refill  . amLODipine (NORVASC) 5 MG tablet TAKE 1 TABLET ONCE DAILY. (Patient taking differently: Take 5 mg by mouth daily.) 30 tablet 3  . aspirin 81 MG chewable tablet Chew 1 tablet (81 mg total) by mouth daily. 30 tablet 0  . carbidopa-levodopa (SINEMET IR) 25-100 MG tablet TAKE 1 TABLET BY MOUTH 3 TIMES A DAY. (Patient taking differently: Take 1 tablet by mouth 3 (three) times daily.) 90 tablet 5  . carvedilol (COREG) 25 MG tablet TAKE 1 TABLET BY MOUTH TWICE DAILY WITH A MEAL. (Patient taking differently: Take 25 mg by mouth 2 (two) times daily with a meal.) 60 tablet 5  . Cyanocobalamin (VITAMIN B-12) 5000 MCG SUBL Place 5,000  mcg under the tongue daily.     . metFORMIN (GLUCOPHAGE) 500 MG tablet TAKE 2 TABLETS TWICE A DAY WITH MEALS. 360 tablet 2  . Multiple Vitamin (MULTIVITAMIN WITH MINERALS) TABS tablet Take 1 tablet by mouth daily.    . Omega-3 Fatty Acids (FISH OIL CONCENTRATE) 1000 MG CAPS Take 1,000 mg by mouth 2 (two) times daily.     No current facility-administered medications on file prior to visit.    Past Medical History:  Diagnosis Date  . Acute renal failure superimposed on stage 3 chronic kidney disease ([place])  01/31/2020  . Breast cancer ([place]) 2012  . Cancer of central portion of right female breast ([place]) 09/04/2010   Patient has a long history of fibrocystic disease. 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 She underwent right total mastectomy with sentinel node biopsy and left total (prophylactic) mastectomy on 10/12/2010. She underwent immediate reconstruction with expander and AlloDerm placement. 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 One lymph  . Complicated UTI (urinary tract infection) 01/31/2020  . Diabetes mellitus   . Dizziness 02/24/2019  . Elevated LFTs 08/10/2014  . Episodic lightheadedness 08/27/2019  . Esophageal reflux 05/06/2008  . Essential hypertension 02/16/2009  . Family history of breast cancer   . Family history of ovarian cancer   . Fatigue 02/24/2019  . Fatty liver 08/05/2015  . Genetic testing 09/04/2017   Negative genetic testing on the common hereditary cancer panel.  The Hereditary Gene Panel offered by [facility] includes sequencing and/or deletion duplication testing of the following 47 genes: APC, ATM, AXIN2, BARD1, BMPR1A, BRCA1, BRCA2, BRIP1, CDH1, CDK4, CDKN2A (p14ARF), CDKN2A (p16INK4a), CHEK2, CTNNA1, DICER1, EPCAM (Deletion/duplication testing only), GREM1 (promoter region deletion/duplicat  . GERD (gastroesophageal reflux disease)   . Gilbert syndrome   . GILBERT'S SYNDROME 02/06/2007   Qualifier: Diagnosis of  By: Janelle Floor    . History of breast cancer 08/04/2015   S/p b/l mastectomy  . History of colonic polyps 06/06/2010   Annotation: destroyed, no clear adenomatous proven Qualifier: Diagnosis of  By: Carlean Purl MD, Tonna Boehringer E  2008 polyp 2013 neg    . Hypertension   . Hypertriglyceridemia 09/30/2007   statin intolerant  Father MI @ 10 Sister CVA > 29   . Hypokalemia due to inadequate potassium intake 01/31/2020  . IBS (irritable bowel syndrome)   . Joint pain 09/02/2010  . Lactic acidosis  01/31/2020  . Osteoarthritis of left knee 08/04/2015   Significant arthritis in knees, limits her activity   . Osteopenia 08/31/2013   [facility]  DEXA 03/20/2018: Osteopenia:  LFN -1.3, R--1.0, spine -0.6-statistically significant increase in BMD in all areas  Findings 08/22/13 : lowest T score -  1.3  @  R femoral neck (hip) ,3.4  %loss @ R femur &   4.1 % in spine   since  2012 Dexa 01/05/16: lowest  - L femur neck T -1.6 Diagnosis: mild Osteopenia Rx: Fosamax remotely; now on Arimidex   . 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 Peripheral neuropathy 08/23/2018  . Plantar fasciitis of right foot    Dr Oneta Rack  . Poor balance 02/20/2018  . S/P bilateral mastectomy 08/04/2015  . Septic shock ([place]) 01/31/2020  . Severe sepsis with acute organ dysfunction due to gram-negative bacteria ([place]) 01/31/2020  . Transfusion history 1976  . Type 2 diabetes mellitus with stage 3 chronic kidney disease, without long-term current use of insulin ([place]) 08/11/2008   Ophth, Dr Kathrin Penner: no  retinopathy  Diabetes maternal grandmother    Past Surgical History:  Procedure Laterality Date  . ABDOMINAL HYSTERECTOMY  1976   with BSO due to infection from [facility] IUD  . APPENDECTOMY  1976   @ [place]     [id number], [id number]  . CATARACT EXTRACTION, BILATERAL Bilateral 2018  . COLONOSCOPY W/ POLYPECTOMY  2006   negative 2011; Dr Carlean Purl  . MASTECTOMY W/ NODES PARTIAL  2012   double mastectomy with nodes taken out on right side   . PLACEMENT OF BREAST IMPLANTS  04/2011   Dr Migdalia Dk, [facility]  . TONSILLECTOMY AND ADENOIDECTOMY      Social History   Socioeconomic History  . Marital status: Married    Spouse name: Not on file  . Number of children: 4  . Years of education: Not on file  . Highest education level: Not on file  Occupational History  . Occupation: retired  Tobacco Use  . Smoking status: Former Smoker    Quit date: 09/18/1977    Years since quitting: 43.3  . Smokeless  tobacco: Never Used  . Tobacco comment: smoked 1957-1979 , up to 1 ppd  Vaping Use  . Vaping Use: Never used  Substance and Sexual Activity  . Alcohol use: No    Comment:  very rarely  . Drug use: No  . Sexual activity: Not Currently    Birth control/protection: Surgical  Other Topics Concern  . Not on file  Social History Narrative   Walking for exercise   Social Determinants of Health   Financial Resource Strain: Low Risk   . Difficulty of Paying Living Expenses: Not hard at all  Food Insecurity: No Food Insecurity  . Worried About Charity fundraiser in the Last Year: Never true  . Ran Out of Food in the Last Year: Never true  Transportation Needs: No Transportation Needs  . Lack of Transportation (Medical): No  . Lack of Transportation (Non-Medical): No  Physical Activity: Sufficiently Active  . Days of Exercise per Week: 5 days  . Minutes of Exercise per Session: 30 min  Stress: No Stress Concern Present  . Feeling of Stress : Not at all  Social Connections: Socially Integrated  . Frequency of Communication with Friends and Family: More than three times a week  . Frequency of Social Gatherings with Friends and Family: Once a week  . Attends Religious Services: More than 4 times per year  . Active Member of Clubs or Organizations: No  . Attends Archivist Meetings: More than 4 times per year  . Marital Status: Married    Family History  Problem Relation Age of Onset  . Heart attack Father 62  . Breast cancer Sister 52        bilateral   . Heart failure Sister        PMH intensive chemotherapy  . Hypertension Sister   . Stroke Sister 10  . Stroke Mother        TMI  . Breast cancer Maternal Aunt 70  . Diabetes Maternal Grandmother   . Ovarian cancer Paternal Grandmother   . Breast cancer Maternal Aunt 77  . Colon cancer Neg Hx   . Stomach cancer Neg Hx   . Esophageal cancer Neg Hx   . Pancreatic cancer Neg Hx   . Liver disease Neg Hx     Review  of Systems  Constitutional: Positive for fatigue (slight increased today). Negative for appetite  change, chills and fever.  Respiratory: Negative for cough, shortness of breath and wheezing.   Cardiovascular: Negative for chest pain, palpitations and leg swelling.  Gastrointestinal: Negative for abdominal pain and nausea.  Genitourinary: Negative for difficulty urinating, dysuria and hematuria.       No cloudy, dark urine       Objective:   Vitals:   01/25/21 1108  BP: 132/60  Pulse: 75  Temp: 97.9 F (36.6 C)  SpO2: 97%   BP Readings from Last 3 Encounters:  01/25/21 132/60  01/20/21 (!) 154/68  08/27/20 128/80   Wt Readings from Last 3 Encounters:  01/25/21 160 lb 9.6 oz (72.8 kg)  01/18/21 156 lb 12 oz (71.1 kg)  08/27/20 163 lb (73.9 kg)   Body mass index is 27.14 kg/m.   Physical Exam    Constitutional: Appears well-developed and well-nourished. No distress.  HENT:  Head: Normocephalic and atraumatic.  Neck: Neck supple. No tracheal deviation present. No thyromegaly present.  No cervical lymphadenopathy Cardiovascular: Normal rate, regular rhythm and normal heart sounds.   No murmur heard. No carotid bruit .  No edema Pulmonary/Chest: Effort normal and breath sounds normal. No respiratory distress. No has no wheezes. No rales.  Abdomen: soft, NT, ND Skin: Skin is warm and dry. Not diaphoretic.  Psychiatric: Normal mood and affect. Behavior is normal.   Lab Results  Component Value Date   WBC 7.0 01/25/2021   HGB 12.5 01/25/2021   HCT 36.6 01/25/2021   PLT 209.0 01/25/2021   GLUCOSE 126 (H) 01/25/2021   CHOL 153 08/27/2020   TRIG 171.0 (H) 08/27/2020   HDL 41.90 08/27/2020   LDLDIRECT 108.0 08/15/2017   LDLCALC 77 08/27/2020   ALT 9 01/25/2021   AST 19 01/25/2021   NA 140 01/25/2021   K 3.9 01/25/2021   CL 105 01/25/2021   CREATININE 0.90 01/25/2021   BUN 16 01/25/2021   CO2 28 01/25/2021   TSH 2.102 02/02/2020   INR 1.1 (H) 04/27/2020   HGBA1C  6.0 01/25/2021   MICROALBUR 1.1 08/27/2020    DG Chest Portable 1 View CLINICAL DATA:  Weakness  EXAM: PORTABLE CHEST 1 VIEW  COMPARISON:  Jan 31, 2020  FINDINGS: The heart size and mediastinal contours are within normal limits. Both lungs are clear. The visualized skeletal structures are unremarkable.  IMPRESSION: No active disease.  Electronically Signed   By: Constance Holster M.D.   On: 01/17/2021 20:40    Assessment & Plan:    See Problem List for Assessment and Plan of chronic medical problems.    This visit occurred during the SARS-CoV-2 public health emergency.  Safety protocols were in place, including screening questions prior to the visit, additional usage of staff PPE, and extensive cleaning of exam room while observing appropriate contact time as indicated for disinfecting solutions.

## 2021-01-25 ENCOUNTER — Ambulatory Visit (INDEPENDENT_AMBULATORY_CARE_PROVIDER_SITE_OTHER): Payer: Medicare Other | Admitting: Internal Medicine

## 2021-01-25 ENCOUNTER — Encounter: Payer: Self-pay | Admitting: Internal Medicine

## 2021-01-25 ENCOUNTER — Other Ambulatory Visit: Payer: Self-pay

## 2021-01-25 VITALS — BP 132/60 | HR 75 | Temp 97.9°F | Ht 64.5 in | Wt 160.6 lb

## 2021-01-25 DIAGNOSIS — R6520 Severe sepsis without septic shock: Secondary | ICD-10-CM

## 2021-01-25 DIAGNOSIS — N1831 Chronic kidney disease, stage 3a: Secondary | ICD-10-CM

## 2021-01-25 DIAGNOSIS — A4150 Gram-negative sepsis, unspecified: Secondary | ICD-10-CM | POA: Diagnosis not present

## 2021-01-25 DIAGNOSIS — N390 Urinary tract infection, site not specified: Secondary | ICD-10-CM

## 2021-01-25 DIAGNOSIS — I10 Essential (primary) hypertension: Secondary | ICD-10-CM | POA: Diagnosis not present

## 2021-01-25 DIAGNOSIS — E1122 Type 2 diabetes mellitus with diabetic chronic kidney disease: Secondary | ICD-10-CM | POA: Diagnosis not present

## 2021-01-25 LAB — COMPREHENSIVE METABOLIC PANEL
ALT: 9 U/L (ref 0–35)
AST: 19 U/L (ref 0–37)
Albumin: 3.8 g/dL (ref 3.5–5.2)
Alkaline Phosphatase: 65 U/L (ref 39–117)
BUN: 16 mg/dL (ref 6–23)
CO2: 28 mEq/L (ref 19–32)
Calcium: 9.7 mg/dL (ref 8.4–10.5)
Chloride: 105 mEq/L (ref 96–112)
Creatinine, Ser: 0.9 mg/dL (ref 0.40–1.20)
GFR: 60.88 mL/min (ref >60.00)
Glucose, Bld: 126 mg/dL — ABNORMAL HIGH (ref 70–99)
Potassium: 3.9 mEq/L (ref 3.5–5.1)
Sodium: 140 mEq/L (ref 135–145)
Total Bilirubin: 0.9 mg/dL (ref 0.2–1.2)
Total Protein: 6.7 g/dL (ref 6.0–8.3)

## 2021-01-25 LAB — CBC WITH DIFFERENTIAL/PLATELET
Basophils Absolute: 0 10*3/uL (ref 0.0–0.1)
Basophils Relative: 0.4 % (ref 0.0–3.0)
Eosinophils Absolute: 0.3 10*3/uL (ref 0.0–0.7)
Eosinophils Relative: 3.8 % (ref 0.0–5.0)
HCT: 36.6 % (ref 36.0–46.0)
Hemoglobin: 12.5 g/dL (ref 12.0–15.0)
Lymphocytes Relative: 21.1 % (ref 12.0–46.0)
Lymphs Abs: 1.5 10*3/uL (ref 0.7–4.0)
MCHC: 34.1 g/dL (ref 30.0–36.0)
MCV: 85.4 fl (ref 78.0–100.0)
Monocytes Absolute: 0.7 10*3/uL (ref 0.1–1.0)
Monocytes Relative: 10.6 % (ref 3.0–12.0)
Neutro Abs: 4.5 10*3/uL (ref 1.4–7.7)
Neutrophils Relative %: 64.1 % (ref 43.0–77.0)
Platelets: 209 10*3/uL (ref 150.0–400.0)
RBC: 4.28 Mil/uL (ref 3.87–5.11)
RDW: 13.8 % (ref 11.5–15.5)
WBC: 7 10*3/uL (ref 4.0–10.5)

## 2021-01-25 LAB — URINALYSIS, ROUTINE W REFLEX MICROSCOPIC
Bilirubin Urine: NEGATIVE
Ketones, ur: NEGATIVE
Nitrite: NEGATIVE
Specific Gravity, Urine: >=1.03 — AB (ref 1.000–1.030)
Total Protein, Urine: NEGATIVE
Urine Glucose: NEGATIVE
Urobilinogen, UA: 0.2 (ref 0.0–1.0)
pH: 6 (ref 5.0–8.0)

## 2021-01-25 LAB — HEMOGLOBIN A1C: Hgb A1c MFr Bld: 6 % (ref 4.6–6.5)

## 2021-01-25 NOTE — Assessment & Plan Note (Signed)
Chronic Well controlled Continue metformin 1000 mg BID Check a1c

## 2021-01-25 NOTE — Assessment & Plan Note (Signed)
Chronic Stable cmp 

## 2021-01-25 NOTE — Assessment & Plan Note (Signed)
Chronic BP well controlled Continue amlodipine 5 mg qd, coreg 25 mg bid cmp

## 2021-01-25 NOTE — Assessment & Plan Note (Signed)
Resolved Has started to have some fatigue since stopping the abx Will check UA, UCx to confirm infection has been successfully treated Check cbc, cmp

## 2021-01-25 NOTE — Assessment & Plan Note (Signed)
Recently admitted for complicated uti with sepsis She had no urinary symptoms and her first sign on infection was confusion and weakness Completed abx yesterday - has started to have some fatigue Will check UA, Ucx to make sure infection is treated successfully Dicussed prevention - increase water intake, she will start cranberry/probiotic supplement Will refer to urology for further evaluation given that this is the second severe uti with sepsis

## 2021-01-26 LAB — URINE CULTURE: Result:: NO GROWTH

## 2021-01-27 DIAGNOSIS — R2689 Other abnormalities of gait and mobility: Secondary | ICD-10-CM | POA: Diagnosis not present

## 2021-02-01 DIAGNOSIS — R2689 Other abnormalities of gait and mobility: Secondary | ICD-10-CM | POA: Diagnosis not present

## 2021-02-07 DIAGNOSIS — R2689 Other abnormalities of gait and mobility: Secondary | ICD-10-CM | POA: Diagnosis not present

## 2021-02-10 ENCOUNTER — Telehealth: Payer: Self-pay | Admitting: Neurology

## 2021-02-10 NOTE — Telephone Encounter (Signed)
Contacted patient, seeing PCP tomorrow, placed on cancellation list.

## 2021-02-10 NOTE — Telephone Encounter (Signed)
All we can do is put her on the waitlist but she can contact her PCP to see if she can see her sooner.

## 2021-02-10 NOTE — Progress Notes (Signed)
Subjective:    Patient ID: [name], female    DOB: 28-Nov-1940, 80 y.o.   MRN: [id number]  HPI The patient is here for an acute visit.  She is here with her husband.   She fell 5/19 and has a black eye.  She fell Wednesday morning - knot on posterior head.  She was standing at the counter and she fell backwards.  Her husband was in a different room so it was unwitnessed.  She does not recall exactly what happened. Thursday had rehab.  BP too high and postponed rehab.  She did report dizziness.   Monday went to rehab.  Tuesday right eye swollen and bruised.  Not other injury as far as they know.  The forehead and eye does not hurt.  Thursday, yesterday went to rehab and advised f/u with PCP due to eye and elevated BP.  At home her BP is usually in the 120-130's.   No headaches.    Medications and allergies reviewed with patient and updated if appropriate.  Patient Active Problem List   Diagnosis Date Noted  . UTI (urinary tract infection) 01/17/2021  . Aortic atherosclerosis ([place]) 08/27/2020  . 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 Parkinson disease ([place]) 08/26/2020  . Paroxysmal atrial fibrillation ([place]) 02/25/2020  . Complicated UTI (urinary tract infection) 01/31/2020  . CKD (chronic kidney disease) stage 3, GFR 30-59 ml/min (HCC) 01/31/2020  . Hypokalemia due to inadequate potassium intake 01/31/2020  . Severe sepsis with acute organ dysfunction due to Gram negative bacteria ([place]) 01/31/2020  . Episodic lightheadedness 08/27/2019  . Dizziness 02/24/2019  . Peripheral neuropathy 08/23/2018  . Poor balance 02/20/2018  . Genetic testing 09/04/2017  . Family history of breast cancer   . Family history of ovarian cancer   . Fatty liver 08/05/2015  . History of breast cancer 08/04/2015  . S/P bilateral mastectomy 08/04/2015  . Osteoarthritis of left knee 08/04/2015  . Elevated LFTs 08/10/2014  . Osteopenia 08/31/2013  . Transfusion history 08/18/2013  . IBS (irritable  bowel syndrome) 08/18/2013  . 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 Joint pain 09/02/2010  . History of colonic polyps 06/06/2010  . Essential hypertension 02/16/2009  . Type 2 diabetes mellitus with stage 3 chronic kidney disease, without long-term current use of insulin ([place]) 08/11/2008  . Hypertriglyceridemia 09/30/2007  . GILBERT'S SYNDROME 02/06/2007    Current Outpatient Medications on File Prior to Visit  Medication Sig Dispense Refill  . amLODipine (NORVASC) 5 MG tablet TAKE 1 TABLET ONCE DAILY. (Patient taking differently: Take 5 mg by mouth daily.) 30 tablet 3  . aspirin 81 MG chewable tablet Chew 1 tablet (81 mg total) by mouth daily. 30 tablet 0  . carbidopa-levodopa (SINEMET IR) 25-100 MG tablet TAKE 1 TABLET BY MOUTH 3 TIMES A DAY. (Patient taking differently: Take 1 tablet by mouth 3 (three) times daily.) 90 tablet 5  . Cyanocobalamin (VITAMIN B-12) 5000 MCG SUBL Place 5,000 mcg under the tongue daily.     . metFORMIN (GLUCOPHAGE) 500 MG tablet TAKE 2 TABLETS TWICE A DAY WITH MEALS. 360 tablet 2  . Multiple Vitamin (MULTIVITAMIN WITH MINERALS) TABS tablet Take 1 tablet by mouth daily.    . Omega-3 Fatty Acids (FISH OIL CONCENTRATE) 1000 MG CAPS Take 1,000 mg by mouth 2 (two) times daily.     No current facility-administered medications on file prior to visit.    Past Medical History:  Diagnosis Date  .  Acute renal failure superimposed on stage 3 chronic kidney disease ([place]) 01/31/2020  . Breast cancer ([place]) 2012  . 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 She underwent right total mastectomy with sentinel node biopsy and left total (prophylactic) mastectomy on 10/12/2010. She underwent immediate reconstruction with expander and AlloDerm placement. 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 One lymph  . Complicated UTI (urinary tract infection) 01/31/2020  . Diabetes mellitus   . Dizziness 02/24/2019  . Elevated LFTs 08/10/2014  . Episodic lightheadedness 08/27/2019  . Esophageal reflux 05/06/2008  . Essential hypertension 02/16/2009  . Family history of breast cancer   . Family history of ovarian cancer   . Fatigue 02/24/2019  . Fatty liver 08/05/2015  . Genetic testing 09/04/2017   Negative genetic testing on the common hereditary cancer panel.  The Hereditary Gene Panel offered by [facility] includes sequencing and/or deletion duplication testing of the following 47 genes: APC, ATM, AXIN2, BARD1, BMPR1A, BRCA1, BRCA2, BRIP1, CDH1, CDK4, CDKN2A (p14ARF), CDKN2A (p16INK4a), CHEK2, CTNNA1, DICER1, EPCAM (Deletion/duplication testing only), GREM1 (promoter region deletion/duplicat  . GERD (gastroesophageal reflux disease)   . Gilbert syndrome   . GILBERT'S SYNDROME 02/06/2007   Qualifier: Diagnosis of  By: Janelle Floor    . History of breast cancer 08/04/2015   S/p b/l mastectomy  . History of colonic polyps 06/06/2010   Annotation: destroyed, no clear adenomatous proven Qualifier: Diagnosis of  By: Carlean Purl MD, Tonna Boehringer E  2008 polyp 2013 neg    . Hypertension   . Hypertriglyceridemia 09/30/2007   statin intolerant  Father MI @ 7 Sister CVA > 19   . Hypokalemia due to inadequate potassium intake 01/31/2020  . IBS (irritable bowel syndrome)   . Joint pain 09/02/2010  . Lactic acidosis 01/31/2020  . Osteoarthritis of left knee 08/04/2015   Significant arthritis in knees, limits her activity   . Osteopenia 08/31/2013   [facility]  DEXA 03/20/2018: Osteopenia:  LFN -1.3, R--1.0, spine -0.6-statistically significant increase in BMD in all areas  Findings 08/22/13 : lowest T score -  1.3  @  R femoral neck (hip) ,3.4  %loss @ R femur &   4.1 % in spine   since  2012 Dexa 01/05/16: lowest  - L femur neck T -1.6 Diagnosis: mild Osteopenia Rx: Fosamax remotely; now on Arimidex    . 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 Peripheral neuropathy 08/23/2018  . Plantar fasciitis of right foot    Dr Oneta Rack  . Poor balance 02/20/2018  . S/P bilateral mastectomy 08/04/2015  . Septic shock ([place]) 01/31/2020  . Severe sepsis with acute organ dysfunction due to gram-negative bacteria ([place]) 01/31/2020  . Transfusion history 1976  . Type 2 diabetes mellitus with stage 3 chronic kidney disease, without long-term current use of insulin ([place]) 08/11/2008   Ophth, Dr Kathrin Penner: no retinopathy  Diabetes maternal grandmother    Past Surgical History:  Procedure Laterality Date  . ABDOMINAL HYSTERECTOMY  1976   with BSO due to infection from [facility] IUD  . APPENDECTOMY  1976   @ [place]     [id number], [phone]  . CATARACT EXTRACTION, BILATERAL Bilateral 2018  . COLONOSCOPY W/ POLYPECTOMY  2006   negative 2011; Dr Carlean Purl  . MASTECTOMY W/ NODES PARTIAL  2012   double mastectomy with nodes  taken out on right side   . PLACEMENT OF BREAST IMPLANTS  04/2011   Dr Migdalia Dk, [facility]  . TONSILLECTOMY AND ADENOIDECTOMY      Social History   Socioeconomic History  . Marital status: Married    Spouse name: Not on file  . Number of children: 4  . Years of education: Not on file  . Highest education level: Not on file  Occupational History  . Occupation: retired  Tobacco Use  . Smoking status: Former Smoker    Quit date: 09/18/1977    Years since quitting: 43.4  . Smokeless tobacco: Never Used  . Tobacco comment: smoked 1957-1979 , up to 1 ppd  Vaping Use  . Vaping Use: Never used  Substance and Sexual Activity  . Alcohol use: No    Comment:  very rarely  . Drug use: No  . Sexual activity: Not Currently    Birth control/protection: Surgical  Other Topics Concern  . Not on file  Social History Narrative   Walking for exercise   Social Determinants of Health   Financial Resource Strain: Low Risk   . Difficulty of Paying Living Expenses:  Not hard at all  Food Insecurity: No Food Insecurity  . Worried About Charity fundraiser in the Last Year: Never true  . Ran Out of Food in the Last Year: Never true  Transportation Needs: No Transportation Needs  . Lack of Transportation (Medical): No  . Lack of Transportation (Non-Medical): No  Physical Activity: Sufficiently Active  . Days of Exercise per Week: 5 days  . Minutes of Exercise per Session: 30 min  Stress: No Stress Concern Present  . Feeling of Stress : Not at all  Social Connections: Socially Integrated  . Frequency of Communication with Friends and Family: More than three times a week  . Frequency of Social Gatherings with Friends and Family: Once a week  . Attends Religious Services: More than 4 times per year  . Active Member of Clubs or Organizations: No  . Attends Archivist Meetings: More than 4 times per year  . Marital Status: Married    Family History  Problem Relation Age of Onset  . Heart attack Father 40  . Breast cancer Sister 38        bilateral   . Heart failure Sister        PMH intensive chemotherapy  . Hypertension Sister   . Stroke Sister 54  . Stroke Mother        TMI  . Breast cancer Maternal Aunt 43  . Diabetes Maternal Grandmother   . Ovarian cancer Paternal Grandmother   . Breast cancer Maternal Aunt 77  . Colon cancer Neg Hx   . Stomach cancer Neg Hx   . Esophageal cancer Neg Hx   . Pancreatic cancer Neg Hx   . Liver disease Neg Hx     Review of Systems  Constitutional: Negative for fever.  Eyes: Positive for visual disturbance (? blurriness two days ago).  Respiratory: Negative for shortness of breath.   Cardiovascular: Negative for chest pain, palpitations and leg swelling.  Neurological: Negative for dizziness and headaches.       Objective:   Vitals:   02/11/21 1358  BP: (!) 146/76  Pulse: 75  Temp: 99.1 F (37.3 C)  SpO2: 96%   BP Readings from Last 3 Encounters:  02/11/21 (!) 146/76  01/25/21  132/60  01/20/21 (!) 154/68   Wt Readings from Last 3 Encounters:  02/11/21 156 lb 12.8 oz (71.1 kg)  01/25/21 160 lb 9.6 oz (72.8 kg)  01/18/21 156 lb 12 oz (71.1 kg)   Body mass index is 26.5 kg/m.   Physical Exam Constitutional:      General: She is not in acute distress.    Appearance: Normal appearance. She is not ill-appearing.  HENT:     Head: Normocephalic.     Comments: R orbit with light bruising in right upper eyelid and lower eye lid with mild swelling, minimal bruising right forehead w/o tenderness or wound.  Hematoma posterior head - size of a golf ball - tender, mild bruising surrounding Eyes:     Conjunctiva/sclera: Conjunctivae normal.  Cardiovascular:     Rate and Rhythm: Normal rate and regular rhythm.  Pulmonary:     Effort: Pulmonary effort is normal. No respiratory distress.     Breath sounds: No wheezing or rales.  Musculoskeletal:     Right lower leg: No edema.     Left lower leg: No edema.  Skin:    General: Skin is warm and dry.  Neurological:     General: No focal deficit present.     Mental Status: She is alert.            Assessment & Plan:    See Problem List for Assessment and Plan of chronic medical problems.    This visit occurred during the SARS-CoV-2 public health emergency.  Safety protocols were in place, including screening questions prior to the visit, additional usage of staff PPE, and extensive cleaning of exam room while observing appropriate contact time as indicated for disinfecting solutions.

## 2021-02-10 NOTE — Telephone Encounter (Signed)
Please see message. °

## 2021-02-10 NOTE — Telephone Encounter (Signed)
Patient's spouse called and said the patient fell a few days ago and hit her head on the back. She still has quite a bit of swelling. She also has a black eye from the fall.  Patient went to PT today, per spouse, and they said they will not do anymore PT until she is seen by a doctor.  They advised her to see Dr. Tomi Likens.   Patient added 06/09/21 at 10:50 AM, next available. Patient on wait list.  Patient's husband requests a call back with guidance.

## 2021-02-11 ENCOUNTER — Other Ambulatory Visit: Payer: Self-pay | Admitting: Internal Medicine

## 2021-02-11 ENCOUNTER — Encounter: Payer: Self-pay | Admitting: Internal Medicine

## 2021-02-11 ENCOUNTER — Other Ambulatory Visit: Payer: Self-pay

## 2021-02-11 ENCOUNTER — Ambulatory Visit (INDEPENDENT_AMBULATORY_CARE_PROVIDER_SITE_OTHER): Payer: Medicare Other | Admitting: Internal Medicine

## 2021-02-11 VITALS — BP 126/72 | HR 75 | Temp 99.1°F | Ht 64.5 in | Wt 156.8 lb

## 2021-02-11 DIAGNOSIS — G20 Parkinson's disease: Secondary | ICD-10-CM | POA: Diagnosis not present

## 2021-02-11 DIAGNOSIS — I10 Essential (primary) hypertension: Secondary | ICD-10-CM | POA: Diagnosis not present

## 2021-02-11 DIAGNOSIS — S0990XA Unspecified injury of head, initial encounter: Secondary | ICD-10-CM

## 2021-02-11 MED ORDER — CARBIDOPA-LEVODOPA 25-100 MG PO TABS: ORAL_TABLET | ORAL | 0 refills | Status: DC

## 2021-02-11 NOTE — Patient Instructions (Addendum)
    Medications changes include :   none     Please followup in November as scheduled.

## 2021-02-12 DIAGNOSIS — S0990XA Unspecified injury of head, initial encounter: Secondary | ICD-10-CM | POA: Insufficient documentation

## 2021-02-12 NOTE — Assessment & Plan Note (Signed)
Chronic Due for f/u with Dr Tomi Likens - has appt but will run out of sinemet prior to that so I will refill until she is able to see him

## 2021-02-12 NOTE — Assessment & Plan Note (Signed)
Chronic bp slightly elevated initially, then better BP well controlled at home No change to medication - it may be slightly high at times, but can not lower it any more due to increased risk of falls Continue amlodipine 5 mg qd, coreg 25 mg bid

## 2021-02-12 NOTE — Assessment & Plan Note (Signed)
Acute Occurred last week - no LOC Fell backwards and hit head  - hematoma posterior head - no headaches, confusion or other concerning symptoms - no imaging needed at this time Has developed some bruising around Right orbit a few days later - likely related - either she hit her eye area when she fell or gravity bruising from hitting her posterior head - either way - it is mild and can just be monitored

## 2021-02-24 NOTE — Progress Notes (Signed)
 NEUROLOGY FOLLOW UP OFFICE NOTE  [address] [place] [id number]  Assessment/Plan:   Parkinsonian syndrome - idiopathic Parkinson's disease but given that she has not responded to carbidopa-levodopa, consider a parkinson plus syndrome such as progressive supranuclear palsy (she has reduced upward gaze).  No hallucinations to suggest Lewy Body Dement.     Titrate carbidopa-levodopa 25/100mg to 1.5 tablets three times daily Will refer to neurorehab where they specialize in treating parkinson's patients and can also provide exercises to perform at home. Follow up 4 months.  Subjective:  Katherene Allerton is a 80 year old female with diabetes, hypertension, Gilbert's syndrome, and irritable bowel syndrome who follows up for abnormal gait.  She is accompanied by her husband who supplements history.   UPDATE: Last seen in January 2020.  She was started on Sinemet in late 2019 and she noted some improvement in movement.  Due to presence of Babinski, she had an MRI of brain on 10/19/2018 which showed no acute abnormality, and MRI of cervical spine on 11/04/2018 which showed arthritic changes but no significant stenosis causing myelopathy.  She was then lost to follow up due to onset of the Covid pandemic.  She has been on carbidopa-levodopa but has not noticed a difference.  Balance has gotten worse despite physical therapy.  She has had falls.  She can barely write now.  No difficulty swallowing.  She does report freezing.  No hallucinations .  No constipation. In fact, she has IBS.    HISTORY: Since summer 2018, she has had trouble with her penmanship.  She states it is more difficult to write and that her handwriting is smaller.  She denies cramping.   In early 2019, she started having increased problems with balance.  She needs to push off in order to stand.  She feels weak in the legs.  She denies back pain or radicular pain and numbness in the feet.  She denies neck pain.  She denies freezing when  initiating taking a step.  She denies dizziness or lightheadedness.   No history of REM sleep behavior disorder.  There is no family history of PD.  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 She underwent right total mastectomy with sentinel node biopsy and left total (prophylactic) mastectomy on 10/12/2010. She underwent immediate reconstruction with expander and AlloDerm placement. 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 One lymph   Complicated UTI (urinary tract infection) 01/31/2020   Diabetes mellitus    Dizziness 02/24/2019   Elevated LFTs 08/10/2014   Episodic lightheadedness 08/27/2019   Esophageal reflux 05/06/2008   Essential hypertension 02/16/2009   Family history of breast cancer    Family history of ovarian cancer    Fatigue 02/24/2019   Fatty liver 08/05/2015   Genetic testing 09/04/2017   Negative genetic testing on the common hereditary cancer panel.  The Hereditary Gene Panel offered by [facility] includes sequencing and/or deletion duplication testing of the following 47 genes: APC, ATM, AXIN2, BARD1, BMPR1A, BRCA1, BRCA2, BRIP1, CDH1, CDK4, CDKN2A (p14ARF), CDKN2A (p16INK4a), CHEK2, CTNNA1, DICER1, EPCAM (Deletion/duplication testing only), GREM1 (promoter region deletion/duplicat   GERD (gastroesophageal reflux disease)    Gilbert syndrome      GILBERT'S SYNDROME 02/06/2007   Qualifier: Diagnosis of  By: Bixby, Kathleen     History of breast cancer 08/04/2015   S/p b/l mastectomy   History of colonic polyps 06/06/2010   Annotation: destroyed, no clear adenomatous proven Qualifier: Diagnosis of  By: Gessner MD, FACG, Carl E  2008  polyp 2013 neg     Hypertension    Hypertriglyceridemia 09/30/2007   statin intolerant  Father MI @ 72 Sister CVA > 65    Hypokalemia due to inadequate potassium intake 01/31/2020   IBS (irritable bowel syndrome)    Joint pain 09/02/2010   Lactic acidosis 01/31/2020   Osteoarthritis of left knee 08/04/2015   Significant arthritis in knees, limits her activity    Osteopenia 08/31/2013   [facility]  DEXA 03/20/2018: Osteopenia:  LFN -1.3, R--1.0, spine -0.6-statistically significant increase in BMD in all areas  Findings 08/22/13 : lowest T score -  1.3  @  R femoral neck (hip) ,3.4  %loss @ R femur &   4.1 % in spine   since  2012 Dexa 01/05/16: lowest  - L femur neck T -1.6 Diagnosis: mild Osteopenia Rx: Fosamax remotely; now on Arimidex    Parkinsonian features 01/31/2020   Peripheral neuropathy 08/23/2018   Plantar fasciitis of right foot    Dr Pettrey   Poor balance 02/20/2018   S/P bilateral mastectomy 08/04/2015   Septic shock (HCC) 01/31/2020   Severe sepsis with acute organ dysfunction due to gram-negative bacteria (HCC) 01/31/2020   Transfusion history 1976   Type 2 diabetes mellitus with stage 3 chronic kidney disease, without long-term current use of insulin (HCC) 08/11/2008   Ophth, Dr Stoneburner: no retinopathy  Diabetes maternal grandmother    MEDICATIONS: Current Outpatient Medications on File Prior to Visit  Medication Sig Dispense Refill   amLODipine (NORVASC) 5 MG tablet TAKE 1 TABLET ONCE DAILY. (Patient taking differently: Take 5 mg by mouth daily.) 30 tablet 3   aspirin 81 MG chewable tablet Chew 1 tablet (81 mg total) by mouth daily. 30 tablet 0   carbidopa-levodopa (SINEMET IR) 25-100 MG tablet TAKE 1 TABLET BY MOUTH 3 TIMES A DAY. 90 tablet 0   carvedilol (COREG) 25 MG tablet TAKE 1 TABLET BY MOUTH TWICE DAILY WITH A MEAL. 60 tablet 5   Cyanocobalamin (VITAMIN B-12) 5000 MCG SUBL Place 5,000 mcg under the tongue daily.      metFORMIN (GLUCOPHAGE) 500 MG tablet TAKE 2 TABLETS  TWICE A DAY WITH MEALS. 360 tablet 2   Multiple Vitamin (MULTIVITAMIN WITH MINERALS) TABS tablet Take 1 tablet by mouth daily.     Omega-3 Fatty Acids (FISH OIL CONCENTRATE) 1000 MG CAPS Take 1,000 mg by mouth 2 (two) times daily.     No current facility-administered medications on file prior to visit.    ALLERGIES: Allergies  Allergen Reactions   Elemental Sulfur     Flushed, funny feeling in throat    Exemestane Nausea Only    Other reaction(s): Dizziness (intolerance)   Morphine And Related    Statins Other (See Comments)    Elevated LFTs   Ace Inhibitors     REACTION: COUGH   Codeine     REACTION: VOMITTING    FAMILY HISTORY: Family History  Problem Relation Age of Onset   Heart attack Father 72   Breast cancer Sister 68        bilateral    Heart failure Sister        PMH intensive chemotherapy     Hypertension Sister    Stroke Sister 36   Stroke Mother        TMI   Breast cancer Maternal Aunt 83   Diabetes Maternal Grandmother    Ovarian cancer Paternal Grandmother    Breast cancer Maternal Aunt 77   Colon cancer Neg Hx    Stomach cancer Neg Hx    Esophageal cancer Neg Hx    Pancreatic cancer Neg Hx    Liver disease Neg Hx       Objective:  Blood pressure (!) 147/79, pulse 83, height 5' 4" (1.626 m), weight 157 lb 9.6 oz (71.5 kg), SpO2 98 %. General: No acute distress.  Patient appears well-groomed.   Head:  Normocephalic/atraumatic Eyes:  Fundi examined but not visualized Neck: supple, no paraspinal tenderness, full range of motion Heart:  Regular rate and rhythm Lungs:  Clear to auscultation bilaterally Back: No paraspinal tenderness Neurological Exam: alert and oriented to person, place, and time. Speech fluent and not dysarthric, language intact.  Hypomimia. Hypophonia.  Reduced upward gaze.  Otherwise, CN II-XII intact. Mild increased tone in elbows. muscle strength 5-/5 throughout.  Bradykinesia.  Reduced finger-thumb tapping speed and amplitude.  No  tremor.  Sensation to light touch, temperature and vibration intact.  Deep tendon reflexes absent throughout, toes downgoing.  Finger to nose testing intact.  Ambulates with walker.  Shuffling gait.     Metta Clines, DO  CC: Billey Gosling, MD

## 2021-02-25 ENCOUNTER — Ambulatory Visit: Payer: Medicare Other | Admitting: Neurology

## 2021-02-25 ENCOUNTER — Other Ambulatory Visit: Payer: Self-pay

## 2021-02-25 ENCOUNTER — Encounter: Payer: Self-pay | Admitting: Neurology

## 2021-02-25 VITALS — BP 147/79 | HR 83 | Ht 64.0 in | Wt 157.6 lb

## 2021-02-25 DIAGNOSIS — G20 Parkinson's disease: Secondary | ICD-10-CM | POA: Diagnosis not present

## 2021-02-25 MED ORDER — CARBIDOPA-LEVODOPA 25-100 MG PO TABS: 1.5000 | ORAL_TABLET | Freq: Three times a day (TID) | ORAL | 5 refills | Status: DC

## 2021-02-25 NOTE — Patient Instructions (Signed)
1. Start carbidopa (Sinemet) 25/100mg  tablet to 1 and 1/2 tablets three times daily  Take the medication at the same time everyday. The medication does not get absorbed into your body as well, if you take it with protein-containing foods (meat, dairy, beans). Try taking the medication about one hour before meals. If you experience nausea by taking it on an empty stomach, you may take it with carbohydrate-containing food,such as bread or crackers. Side effects to look out for, include dizziness, nausea, vivid dreams and hallucinations. If you experience any of these symptoms, please call us. Refer to neurorehab Follow up 4 to 6 months.

## 2021-03-04 ENCOUNTER — Other Ambulatory Visit: Payer: Self-pay

## 2021-03-04 ENCOUNTER — Ambulatory Visit: Payer: Medicare Other | Attending: Neurology | Admitting: Physical Therapy

## 2021-03-04 DIAGNOSIS — R2681 Unsteadiness on feet: Secondary | ICD-10-CM | POA: Diagnosis not present

## 2021-03-04 DIAGNOSIS — R29818 Other symptoms and signs involving the nervous system: Secondary | ICD-10-CM | POA: Diagnosis not present

## 2021-03-04 DIAGNOSIS — M6281 Muscle weakness (generalized): Secondary | ICD-10-CM | POA: Diagnosis not present

## 2021-03-04 DIAGNOSIS — R2689 Other abnormalities of gait and mobility: Secondary | ICD-10-CM | POA: Diagnosis not present

## 2021-03-04 NOTE — Therapy (Signed)
[facility] [address] [place], [place], [address] Phone: [phone]   Fax:  [phone]  Physical Therapy Evaluation  Patient Details  Name: [name] MRN: [id number] Date of Birth: 10-25-1940 Referring Provider (PT): Carol Clines, DO   Encounter Date: 03/04/2021   PT End of Session - 03/04/21 1024     Visit Number 1    Number of Visits 17    Date for PT Re-Evaluation 04/29/21    Authorization Type [employer] Medicare-Pt has been seen by [facility] this year; will likely need KX    Progress Note Due on Visit 10    PT Start Time 1022    PT Stop Time 1102    PT Time Calculation (min) 40 min    Equipment Utilized During Treatment Gait belt    Activity Tolerance Patient tolerated treatment well    Behavior During Therapy [name] for tasks assessed/performed             Past Medical History:  Diagnosis Date   Acute renal failure superimposed on stage 3 chronic kidney disease ([place]) 01/31/2020   Breast cancer ([place]) 2012   Cancer of central portion of right female breast ([place]) 09/04/2010   Patient has a long history of fibrocystic disease. 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 She underwent right total mastectomy with sentinel node biopsy and left total (prophylactic) mastectomy on 10/12/2010. She underwent immediate reconstruction with expander and AlloDerm placement. 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 One lymph   Complicated UTI (urinary tract infection) 01/31/2020   Diabetes mellitus    Dizziness 02/24/2019   Elevated LFTs 08/10/2014   Episodic lightheadedness 08/27/2019   Esophageal reflux 05/06/2008   Essential hypertension 02/16/2009   Family history of breast cancer    Family history of ovarian cancer    Fatigue 02/24/2019   Fatty liver 08/05/2015   Genetic testing 09/04/2017   Negative genetic testing on the common hereditary cancer panel.   The Hereditary Gene Panel offered by [facility] includes sequencing and/or deletion duplication testing of the following 47 genes: APC, ATM, AXIN2, BARD1, BMPR1A, BRCA1, BRCA2, BRIP1, CDH1, CDK4, CDKN2A (p14ARF), CDKN2A (p16INK4a), CHEK2, CTNNA1, DICER1, EPCAM (Deletion/duplication testing only), GREM1 (promoter region deletion/duplicat   GERD (gastroesophageal reflux disease)    [name] syndrome    GILBERT'S SYNDROME 02/06/2007   Qualifier: Diagnosis of  By: Janelle Floor     History of breast cancer 08/04/2015   S/p b/l mastectomy   History of colonic polyps 06/06/2010   Annotation: destroyed, no clear adenomatous proven Qualifier: Diagnosis of  By: Carlean Purl MD, Tonna Boehringer E  2008 polyp 2013 neg     Hypertension    Hypertriglyceridemia 09/30/2007   statin intolerant  Father MI @ 53 Sister CVA > 65    Hypokalemia due to inadequate potassium intake 01/31/2020   IBS (irritable bowel syndrome)    Joint pain 09/02/2010   Lactic acidosis 01/31/2020   Osteoarthritis of left knee 08/04/2015   Significant arthritis in knees, limits her activity    Osteopenia 08/31/2013   [facility]  DEXA 03/20/2018: Osteopenia:  LFN -1.3, R--1.0, spine -0.6-statistically significant increase in BMD in all areas  Findings 08/22/13 : lowest T score -  1.3  @  R femoral neck (hip) ,3.4  %loss @ R femur &   4.1 % in spine   since  2012 Dexa 01/05/16: lowest  - L femur neck T -1.6 Diagnosis: mild  Osteopenia Rx: Fosamax remotely; now on Arimidex    Parkinsonian features 01/31/2020   Peripheral neuropathy 08/23/2018   Plantar fasciitis of right foot    Dr Oneta Rack   Poor balance 02/20/2018   S/P bilateral mastectomy 08/04/2015   Septic shock ([place]) 01/31/2020   Severe sepsis with acute organ dysfunction due to gram-negative bacteria ([place]) 01/31/2020   Transfusion history 1976   Type 2 diabetes mellitus with stage 3 chronic kidney disease, without long-term current use of insulin ([place]) 08/11/2008   Ophth, Dr Kathrin Penner: no retinopathy   Diabetes maternal grandmother    Past Surgical History:  Procedure Laterality Date   ABDOMINAL HYSTERECTOMY  1976   with BSO due to infection from [place] IUD   [place]   @ TAH & BSO   BREAST LUMPECTOMY     [id number], [phone]   CATARACT EXTRACTION, BILATERAL Bilateral 2018   COLONOSCOPY W/ POLYPECTOMY  2006   negative 2011; Dr Carlean Purl   MASTECTOMY W/ NODES PARTIAL  2012   double mastectomy with nodes taken out on right side    PLACEMENT OF BREAST IMPLANTS  04/2011   Dr Migdalia Dk, [place]      There were no vitals filed for this visit.    Subjective Assessment - 03/04/21 1025     Subjective Fell in January and had 2 breaks in pelvis, and worked with Rosanne Meyers until that healed up.  Had another fall and hit my head.  Pt reports having at least 3-4 falls in the past 6 months.  Usually I'm just standing still and then I find myself on the floor. Falls mostly occur backwards.  Pt uses rollator.    Patient is accompained by: Family member   husband   Pertinent History PMH:  DM, HTN, Gilbert's syndrome, IBS, closed fracture pelvis, breast cancer; UTI, hx of sepsis    Patient Stated Goals Pt's goal for therapy is to stand up and walk around without the walker.  Don't want to fall again.  Also want to work on strength (husband) for getting out of chair.    Currently in Pain? No/denies                [facility] PT Assessment - 03/04/21 1032       Assessment   Medical Diagnosis Parkinson's disease    Referring Provider (PT) Carol Clines, DO    Onset Date/Surgical Date 02/25/21    Prior Therapy pt has recently completed [facility] course of PT      Precautions   Precautions Fall      Balance Screen   Has the patient fallen in the past 6 months Yes    How many times? 3-4    Has the patient had a decrease in activity level because of a fear of falling?  Yes    Is the patient reluctant to leave their home because of a fear  of falling?  Yes      [place] residence    Living Arrangements Spouse/significant other    Available Help at Discharge Family    Type of [place] to enter    Entrance Stairs-Number of Steps 2    [place] One level    [place] - 4 wheels      Prior Function   Level of [place] Enjoys being outside,  walking around.  Limited with meal prep.      Observation/Other Assessments   Focus on Therapeutic Outcomes (FOTO)  NA      Posture/Postural Control   Posture/Postural Control Postural limitations    Postural Limitations Rounded Shoulders      ROM / Strength   AROM / PROM / Strength Strength;AROM      AROM   Overall AROM  Deficits    Overall AROM Comments decreased R ankle dorsiflexion; arthritis limits due to occasional R knee pain      Strength   Overall Strength Deficits    Overall Strength Comments grossly tested 4/5 bilateral lower extremities, 3/5 R ankle dorsiflexion, 3+/5 R hip flexion      Transfers   Transfers Sit to Stand;Stand to Sit    Sit to Stand 5: Supervision;With upper extremity assist;From chair/3-in-1    Five time sit to stand comments  20.69    Stand to Sit To chair/3-in-1;5: Supervision;With upper extremity assist      Ambulation/Gait   Ambulation/Gait Yes    Ambulation/Gait Assistance 5: Supervision;4: Min guard    Ambulation Distance (Feet) 150 Feet    Assistive device Rollator    Gait Pattern Step-through pattern;Decreased step length - right;Decreased trunk rotation;Decreased hip/knee flexion - left;Shuffle;Festinating   Festination RLE upon initiation of gait   Ambulation Surface Level;Indoor    Gait velocity 15.88 sec = 2.07 ft/sec      Timed Up and Go Test   TUG Normal TUG    Normal TUG (seconds) 23.44    Cognitive TUG (seconds) 29.9   stops counting   TUG Comments Scores >13.5-15 sec indicate increased fall  risk; >10% difference indicates difficultyw ith dual tasking      High Level Balance   High Level Balance Comments Posterior push and release AND anterior push and release:  pt would fall if unaided                        Objective measurements completed on examination: See above findings.               PT Education - 03/04/21 1101     Education provided Yes    Education Details PT eval results, POC; tips to avoid festination with initiation of gait    Person(s) Educated Patient;Spouse    Methods Explanation;Demonstration    Comprehension Verbalized understanding;Returned demonstration              PT Short Term Goals - 03/04/21 1157       PT SHORT TERM GOAL #1   Title Pt will perform HEP with family supervision for improved strength, balance, transfers, and gait.  TARGET for all STGs 04/01/2021    Time 4    Period Weeks    Status New      PT SHORT TERM GOAL #2   Title Pt will improve 5x sit<>stand to less than or equal to 15 sec to demonstrate improved functional strength and transfer efficiency.    Baseline 20.69 with UE support    Time 4    Period Weeks    Status New      PT SHORT TERM GOAL #3   Title Pt will improve TUG score to less than or equal to 18 seconds for decreased fall risk.    Baseline 23.44 sec    Time 4    Period Weeks    Status New      PT SHORT TERM  GOAL #4   Title Berg Balance score to be assessed, wtih goal to be written as approrpiate.    Time 4    Period Weeks    Status New      PT SHORT TERM GOAL #5   Title Pt/husband will verbalize understanding of fall prevention in home environment.    Time 4    Period Weeks    Status New               PT Long Term Goals - 03/04/21 1200       PT LONG TERM GOAL #1   Title Pt will perform HEP with supervision of family to address balance, gait, functional mobility.  TARGET all LTGs 04/29/2021    Time 8    Period Weeks    Status New      PT LONG TERM GOAL #2    Title Pt will improve 5x sit<>stand to less than or equal to 12.5 sec to demonstrate improved functional strength and transfer efficiency.    Time 8    Period Weeks    Status New      PT LONG TERM GOAL #3   Title Pt will improve TUG score to less than or equal to 15 seconds for decreased fall risk.    Time 8    Period Weeks    Status New      PT LONG TERM GOAL #4   Title Berg score to imporve by 5 points from baseline for decreased fall rsik.    Time 8    Period Weeks    Status New      PT LONG TERM GOAL #5   Title Pt will improve gait velocity to at least 2.62 ft/sec for improved gait efficiency and safety.    Baseline 2.07 ft/sec    Time 8    Period Weeks    Status New                    Plan - 03/04/21 1151     Clinical Impression Statement Pt is a 80 year old female who presents to OPPT with history of gait disorder, Parkinsonism.  Pt has had recent falls, at least 3-4 falls in past 6 months.  One fall in January resulted in pevlic fracture, and pt has seen [facility] PT following this.  She has been referred by neurologist to neurorehab to specifically address balance in relation to Parkinsonism.  Pt reports falls occur posteriorly.  With push and release test anterior and posterior direction, she would fall if unable; she does not take step at all to recover.  Pt presents with decreased strength, decreased timing and coordination of gait, limited community ambulator gait velocity; decreased balance, postural instability.  Pt is at high fall risk per TUG scores; she demo decreased ability for dual tasking with gait.  She would benefit from skilled PT to address the above stated deficits to decrease fall risk and improve overall functional mobility.    Personal Factors and Comorbidities Comorbidity 3+    Comorbidities PMH:  DM, HTN, Gilbert's syndrome, IBS, closed fracture pelvis, breast cancer, hx of sepsis    Examination-Activity Limitations Locomotion  Level;Transfers;Stand;Stairs    Examination-Participation Restrictions Community Activity;Meal Prep    Stability/Clinical Decision Making Evolving/Moderate complexity    Clinical Decision Making Moderate    Rehab Potential Good    PT Frequency 2x / week    PT Duration 8 weeks    PT Treatment/Interventions ADLs/Self  Care Home Management;Gait training;Stair training;Functional mobility training;Therapeutic activities;Therapeutic exercise;Balance training;Patient/family education;Neuromuscular re-education    PT Next Visit Plan Initiate HEP to address hip, ankle, step strategy for balance; review sit to stand/rocking to lessen festination.  Fall prevention/tips to reduce festination with gait.  Work to avoid LOB in posterior direction.    Recommended Other Services Pt would benefit from OT evaluation due to pt reprots of difficulty with handwriting    Consulted and Agree with Plan of Care Family member/caregiver;Patient    Family Member Consulted Husband             Patient will benefit from skilled therapeutic intervention in order to improve the following deficits and impairments:  Abnormal gait, Difficulty walking, Decreased balance, Decreased mobility, Decreased strength, Postural dysfunction  Visit Diagnosis: Other abnormalities of gait and mobility  Unsteadiness on feet  Muscle weakness (generalized)  Other symptoms and signs involving the nervous system     Problem List Patient Active Problem List   Diagnosis Date Noted   Head injury, acute, initial encounter 02/12/2021   UTI (urinary tract infection) 01/17/2021   Aortic atherosclerosis ([place]) 08/27/2020   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   Parkinson disease ([place]) 08/26/2020   Paroxysmal atrial fibrillation ([place]) 60/73/7106   Complicated UTI (urinary tract infection) 01/31/2020   CKD (chronic kidney disease) stage 3, GFR 30-59 ml/min ([facility]) 01/31/2020   Hypokalemia due to inadequate potassium intake 01/31/2020   Severe  sepsis with acute organ dysfunction due to Gram negative bacteria ([place]) 01/31/2020   Episodic lightheadedness 08/27/2019   Dizziness 02/24/2019   Peripheral neuropathy 08/23/2018   Poor balance 02/20/2018   Genetic testing 09/04/2017   Family history of breast cancer    Family history of ovarian cancer    Fatty liver 08/05/2015   History of breast cancer 08/04/2015   S/P bilateral mastectomy 08/04/2015   Osteoarthritis of left knee 08/04/2015   Elevated LFTs 08/10/2014   Osteopenia 08/31/2013   Transfusion history 08/18/2013   IBS (irritable bowel syndrome) 08/18/2013   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 03/04/2021, 12:05 PM [name]., PT  [facility] [address] [place], [place], [address] Phone: [phone]   Fax:  [phone]  Name: [name] MRN: [id number] Date of Birth: 06-21-1941

## 2021-03-07 ENCOUNTER — Ambulatory Visit: Payer: Medicare Other | Admitting: Physical Therapy

## 2021-03-07 ENCOUNTER — Other Ambulatory Visit: Payer: Self-pay

## 2021-03-07 DIAGNOSIS — R2689 Other abnormalities of gait and mobility: Secondary | ICD-10-CM | POA: Diagnosis not present

## 2021-03-07 DIAGNOSIS — M6281 Muscle weakness (generalized): Secondary | ICD-10-CM | POA: Diagnosis not present

## 2021-03-07 DIAGNOSIS — R29818 Other symptoms and signs involving the nervous system: Secondary | ICD-10-CM | POA: Diagnosis not present

## 2021-03-07 DIAGNOSIS — R2681 Unsteadiness on feet: Secondary | ICD-10-CM | POA: Diagnosis not present

## 2021-03-07 NOTE — Therapy (Signed)
[facility] [address] [place], [place], [address] Phone: [phone]   Fax:  [phone]  Physical Therapy Treatment  Patient Details  Name: [name] MRN: [id number] Date of Birth: August 05, 1941 Referring Provider (PT): Metta Clines, DO   Encounter Date: 03/07/2021   PT End of Session - 03/07/21 1204     Visit Number 2    Number of Visits 17    Date for PT Re-Evaluation 04/29/21    Authorization Type [employer] Medicare-Pt has been seen by [facility] this year; will likely need KX    Progress Note Due on Visit 10    PT Start Time 1020    PT Stop Time 1100    PT Time Calculation (min) 40 min    Equipment Utilized During Treatment Gait belt    Activity Tolerance Patient tolerated treatment well    Behavior During Therapy [facility] for tasks assessed/performed             Past Medical History:  Diagnosis Date   Acute renal failure superimposed on stage 3 chronic kidney disease ([place]) 01/31/2020   Breast cancer ([place]) 2012   Cancer of central portion of right female breast ([place]) 09/04/2010   Patient has a long history of fibrocystic disease. 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 She underwent right total mastectomy with sentinel node biopsy and left total (prophylactic) mastectomy on 10/12/2010. She underwent immediate reconstruction with expander and AlloDerm placement. 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 One lymph   Complicated UTI (urinary tract infection) 01/31/2020   Diabetes mellitus    Dizziness 02/24/2019   Elevated LFTs 08/10/2014   Episodic lightheadedness 08/27/2019   Esophageal reflux 05/06/2008   Essential hypertension 02/16/2009   Family history of breast cancer    Family history of ovarian cancer    Fatigue 02/24/2019   Fatty liver 08/05/2015   Genetic testing 09/04/2017   Negative genetic testing on the common hereditary cancer panel.  The  Hereditary Gene Panel offered by [facility] includes sequencing and/or deletion duplication testing of the following 47 genes: APC, ATM, AXIN2, BARD1, BMPR1A, BRCA1, BRCA2, BRIP1, CDH1, CDK4, CDKN2A (p14ARF), CDKN2A (p16INK4a), CHEK2, CTNNA1, DICER1, EPCAM (Deletion/duplication testing only), GREM1 (promoter region deletion/duplicat   GERD (gastroesophageal reflux disease)    [name] syndrome    GILBERT'S SYNDROME 02/06/2007   Qualifier: Diagnosis of  By: Janelle Floor     History of breast cancer 08/04/2015   S/p b/l mastectomy   History of colonic polyps 06/06/2010   Annotation: destroyed, no clear adenomatous proven Qualifier: Diagnosis of  By: Carlean Purl MD, Tonna Boehringer E  2008 polyp 2013 neg     Hypertension    Hypertriglyceridemia 09/30/2007   statin intolerant  Father MI @ 2 Sister CVA > 65    Hypokalemia due to inadequate potassium intake 01/31/2020   IBS (irritable bowel syndrome)    Joint pain 09/02/2010   Lactic acidosis 01/31/2020   Osteoarthritis of left knee 08/04/2015   Significant arthritis in knees, limits her activity    Osteopenia 08/31/2013   [facility]  DEXA 03/20/2018: Osteopenia:  LFN -1.3, R--1.0, spine -0.6-statistically significant increase in BMD in all areas  Findings 08/22/13 : lowest T score -  1.3  @  R femoral neck (hip) ,3.4  %loss @ R femur &   4.1 % in spine   since  2012 Dexa 01/05/16: lowest  - L femur neck T -1.6 Diagnosis: mild  Osteopenia Rx: Fosamax remotely; now on Arimidex    Parkinsonian features 01/31/2020   Peripheral neuropathy 08/23/2018   Plantar fasciitis of right foot    Dr Oneta Rack   Poor balance 02/20/2018   S/P bilateral mastectomy 08/04/2015   Septic shock ([place]) 01/31/2020   Severe sepsis with acute organ dysfunction due to gram-negative bacteria ([place]) 01/31/2020   Transfusion history 1976   Type 2 diabetes mellitus with stage 3 chronic kidney disease, without long-term current use of insulin ([place]) 08/11/2008   Ophth, Dr Kathrin Penner: no retinopathy  Diabetes  maternal grandmother    Past Surgical History:  Procedure Laterality Date   ABDOMINAL HYSTERECTOMY  1976   with BSO due to infection from [place] IUD   [place]   @ [facility] & BSO   BREAST LUMPECTOMY     [id number], [phone]   CATARACT EXTRACTION, BILATERAL Bilateral 2018   COLONOSCOPY W/ POLYPECTOMY  2006   negative 2011; Dr Carlean Purl   MASTECTOMY W/ NODES PARTIAL  2012   double mastectomy with nodes taken out on right side    PLACEMENT OF BREAST IMPLANTS  04/2011   Dr Migdalia Dk, [place]      There were no vitals filed for this visit.   Subjective Assessment - 03/07/21 1020     Subjective The standing up slowly and rocking has helped.  No falls over the weekend.    Patient is accompained by: Family member   husband   Pertinent History PMH:  DM, HTN, Gilbert's syndrome, IBS, closed fracture pelvis, breast cancer; UTI, hx of sepsis    Patient Stated Goals Pt's goal for therapy is to stand up and walk around without the walker.  Don't want to fall again.  Also want to work on strength (husband) for getting out of chair.    Currently in Pain? No/denies                               [facility] Adult PT Treatment/Exercise - 03/07/21 0001       Transfers   Transfers Sit to Stand;Stand to Sit    Sit to Stand 5: Supervision;With upper extremity assist;From chair/3-in-1;From bed    Sit to Stand Details (indicate cue type and reason) Needs cues for hand placement and to lock brakes    Stand to Sit To chair/3-in-1;5: Supervision;With upper extremity assist;To bed    Number of Reps Other reps (comment)   At least 5 reps throughout session.     Ambulation/Gait   Ambulation/Gait Yes    Ambulation/Gait Assistance 5: Supervision;4: Min guard    Ambulation/Gait Assistance Details Cues to increase R foot clearance.    Ambulation Distance (Feet) 345 Feet   100 x 2, 50 ft x 3   Assistive device Rollator    Gait Pattern  Step-through pattern;Decreased step length - right;Decreased trunk rotation;Decreased hip/knee flexion - left;Shuffle;Festinating    Ambulation Surface Level;Indoor    Gait Comments Cues for increased R foot clearance.  Also practiced gait with start/stop with widened BOS for weightshifting.                 Balance Exercises - 03/07/21 0001       Balance Exercises: Standing   Wall Bumps Hip;Eyes opened;10 reps;Limitations    Wall Bumps Limitations at counter    Stepping Strategy Anterior;Posterior;Lateral;UE support;10 reps;Limitations    Stepping Strategy Limitations Verbal, visual, tactile cues; cues for  increased R foot clearance    Heel Raises Both;10 reps    Toe Raise Both;10 reps    Other Standing Exercises Wide BOS lateral weightshifting x 10 reps, then stagger stance forward/back rocking x 10 reps; then wide BOS anterior/posterior weigthshifting x 10 reps UE support at counter.            Additional practice, several reps of each exercise in HEP, with written instructions/printout.  Showed husband at end of session; request that pt perform with husband supervision.   PT Education - 03/07/21 1204     Education provided Yes    Education Details HEP additions; discussed how using weightshifting exercises in daily activities can help balance, help prevent LOB    Person(s) Educated Patient    Methods Explanation;Demonstration;Handout    Comprehension Verbalized understanding;Returned demonstration              PT Short Term Goals - 03/04/21 1157       PT SHORT TERM GOAL #1   Title Pt will perform HEP with family supervision for improved strength, balance, transfers, and gait.  TARGET for all STGs 04/01/2021    Time 4    Period Weeks    Status New      PT SHORT TERM GOAL #2   Title Pt will improve 5x sit<>stand to less than or equal to 15 sec to demonstrate improved functional strength and transfer efficiency.    Baseline 20.69 with UE support    Time 4     Period Weeks    Status New      PT SHORT TERM GOAL #3   Title Pt will improve TUG score to less than or equal to 18 seconds for decreased fall risk.    Baseline 23.44 sec    Time 4    Period Weeks    Status New      PT SHORT TERM GOAL #4   Title Berg Balance score to be assessed, wtih goal to be written as approrpiate.    Time 4    Period Weeks    Status New      PT SHORT TERM GOAL #5   Title Pt/husband will verbalize understanding of fall prevention in home environment.    Time 4    Period Weeks    Status New               PT Long Term Goals - 03/04/21 1200       PT LONG TERM GOAL #1   Title Pt will perform HEP with supervision of family to address balance, gait, functional mobility.  TARGET all LTGs 04/29/2021    Time 8    Period Weeks    Status New      PT LONG TERM GOAL #2   Title Pt will improve 5x sit<>stand to less than or equal to 12.5 sec to demonstrate improved functional strength and transfer efficiency.    Time 8    Period Weeks    Status New      PT LONG TERM GOAL #3   Title Pt will improve TUG score to less than or equal to 15 seconds for decreased fall risk.    Time 8    Period Weeks    Status New      PT LONG TERM GOAL #4   Title Berg score to imporve by 5 points from baseline for decreased fall rsik.    Time 8    Period Weeks  Status New      PT LONG TERM GOAL #5   Title Pt will improve gait velocity to at least 2.62 ft/sec for improved gait efficiency and safety.    Baseline 2.07 ft/sec    Time 8    Period Weeks    Status New                   Plan - 03/07/21 1204     Clinical Impression Statement Initiated formal HEP today, addressing weightshifting through hips for ankle/hip strategy for balance; also worked on stepping strategy work.  With cues and repetition, pt performs hip, ankle, step strategies well; however, with transition movements, changes of direction, she does not appear well balanced, and will need  cues/repetition on using these strategies with transition movements.  She does report slowing down and rocking prior to initiating gait (since instruction at eval), and  reports that has helped her feel more steady.  She will continue to benefit from skilled PT to fruther address balance and gait to improve balance and decrease risk of falls.    Personal Factors and Comorbidities Comorbidity 3+    Comorbidities PMH:  DM, HTN, Gilbert's syndrome, IBS, closed fracture pelvis, breast cancer, hx of sepsis    Examination-Activity Limitations Locomotion Level;Transfers;Stand;Stairs    Examination-Participation Restrictions Community Activity;Meal Prep    Stability/Clinical Decision Making Evolving/Moderate complexity    Rehab Potential Good    PT Frequency 2x / week    PT Duration 8 weeks    PT Treatment/Interventions ADLs/Self Care Home Management;Gait training;Stair training;Functional mobility training;Therapeutic activities;Therapeutic exercise;Balance training;Patient/family education;Neuromuscular re-education    PT Next Visit Plan Review HEP to address hip, ankle, step strategy for balance; review sit to stand/rocking to lessen festination.  Fall prevention/tips to reduce festination with gait.  Work to avoid LOB in posterior direction.    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) 49/44/9675   Complicated UTI (urinary tract infection) 01/31/2020   CKD (chronic kidney disease) stage 3, GFR 30-59 ml/min ([place]) 01/31/2020   Hypokalemia due to inadequate potassium intake 01/31/2020   Severe sepsis with acute organ dysfunction due to Gram negative bacteria ([place]) 01/31/2020   Episodic lightheadedness 08/27/2019   Dizziness 02/24/2019   Peripheral neuropathy 08/23/2018   Poor balance 02/20/2018   Genetic testing 09/04/2017   Family history of breast cancer    Family history of ovarian cancer    Fatty liver 08/05/2015   History of breast cancer 08/04/2015   S/P bilateral mastectomy 08/04/2015   Osteoarthritis of left knee 08/04/2015   Elevated LFTs 08/10/2014   Osteopenia 08/31/2013   Transfusion history 08/18/2013   IBS (irritable bowel syndrome) 08/18/2013   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 03/07/2021, 12:08 PM Frazier Butt., PT  [facility] [address] [place], [place], [address] Phone: [phone]   Fax:  [phone]  Name: [name] MRN: [id number] Date of Birth: 18-May-1941

## 2021-03-07 NOTE — Patient Instructions (Signed)
Access Code: [email or web address] URL: [email or web address] Date: 03/07/2021 Prepared by: Mady Haagensen  Exercises Side to side weightshift - 1 x daily - 5 x weekly - 1-2 sets - 10 reps Heel Toe Raises with Counter Support - 1 x daily - 5 x weekly - 1-2 sets - 10 reps Staggered Stance Forward Backward Weight Shift with Counter Support - 1 x daily - 5 x weekly - 1-2 sets - 10 reps Standing posture stretch - 1 x daily - 5 x weekly - 1-2 sets - 10 reps

## 2021-03-08 ENCOUNTER — Ambulatory Visit: Payer: Medicare Other | Admitting: Physical Therapy

## 2021-03-08 ENCOUNTER — Encounter: Payer: Self-pay | Admitting: Physical Therapy

## 2021-03-08 DIAGNOSIS — R2689 Other abnormalities of gait and mobility: Secondary | ICD-10-CM

## 2021-03-08 DIAGNOSIS — R2681 Unsteadiness on feet: Secondary | ICD-10-CM | POA: Diagnosis not present

## 2021-03-08 DIAGNOSIS — M6281 Muscle weakness (generalized): Secondary | ICD-10-CM | POA: Diagnosis not present

## 2021-03-08 DIAGNOSIS — R29818 Other symptoms and signs involving the nervous system: Secondary | ICD-10-CM | POA: Diagnosis not present

## 2021-03-08 NOTE — Therapy (Signed)
[facility] [address] [place], [place], [address] Phone: [phone]   Fax:  [phone]  Physical Therapy Treatment  Patient Details  Name: [name] MRN: [id number] Date of Birth: 09/16/1941 Referring Provider (PT): Metta Clines, DO   Encounter Date: 03/08/2021   PT End of Session - 03/08/21 1233     Visit Number 3    Number of Visits 17    Date for PT Re-Evaluation 04/29/21    Authorization Type [employer] Medicare-Pt has been seen by [facility] this year; will likely need KX    Progress Note Due on Visit 10    PT Start Time 1234    PT Stop Time 1315    PT Time Calculation (min) 41 min    Equipment Utilized During Treatment Gait belt    Activity Tolerance Patient tolerated treatment well    Behavior During Therapy [facility] for tasks assessed/performed             Past Medical History:  Diagnosis Date   Acute renal failure superimposed on stage 3 chronic kidney disease ([place]) 01/31/2020   Breast cancer ([place]) 2012   Cancer of central portion of right female breast ([place]) 09/04/2010   Patient has a long history of fibrocystic disease. 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 She underwent right total mastectomy with sentinel node biopsy and left total (prophylactic) mastectomy on 10/12/2010. She underwent immediate reconstruction with expander and AlloDerm placement. 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 One lymph   Complicated UTI (urinary tract infection) 01/31/2020   Diabetes mellitus    Dizziness 02/24/2019   Elevated LFTs 08/10/2014   Episodic lightheadedness 08/27/2019   Esophageal reflux 05/06/2008   Essential hypertension 02/16/2009   Family history of breast cancer    Family history of ovarian cancer    Fatigue 02/24/2019   Fatty liver 08/05/2015   Genetic testing 09/04/2017   Negative genetic testing on the common hereditary cancer panel.  The  Hereditary Gene Panel offered by [facility] includes sequencing and/or deletion duplication testing of the following 47 genes: APC, ATM, AXIN2, BARD1, BMPR1A, BRCA1, BRCA2, BRIP1, CDH1, CDK4, CDKN2A (p14ARF), CDKN2A (p16INK4a), CHEK2, CTNNA1, DICER1, EPCAM (Deletion/duplication testing only), GREM1 (promoter region deletion/duplicat   GERD (gastroesophageal reflux disease)    [name] syndrome    GILBERT'S SYNDROME 02/06/2007   Qualifier: Diagnosis of  By: Janelle Floor     History of breast cancer 08/04/2015   S/p b/l mastectomy   History of colonic polyps 06/06/2010   Annotation: destroyed, no clear adenomatous proven Qualifier: Diagnosis of  By: Carlean Purl MD, Tonna Boehringer E  2008 polyp 2013 neg     Hypertension    Hypertriglyceridemia 09/30/2007   statin intolerant  Father MI @ 31 Sister CVA > 65    Hypokalemia due to inadequate potassium intake 01/31/2020   IBS (irritable bowel syndrome)    Joint pain 09/02/2010   Lactic acidosis 01/31/2020   Osteoarthritis of left knee 08/04/2015   Significant arthritis in knees, limits her activity    Osteopenia 08/31/2013   [facility]  DEXA 03/20/2018: Osteopenia:  LFN -1.3, R--1.0, spine -0.6-statistically significant increase in BMD in all areas  Findings 08/22/13 : lowest T score -  1.3  @  R femoral neck (hip) ,3.4  %loss @ R femur &   4.1 % in spine   since  2012 Dexa 01/05/16: lowest  - L femur neck T -1.6 Diagnosis: mild  Osteopenia Rx: Fosamax remotely; now on Arimidex    Parkinsonian features 01/31/2020   Peripheral neuropathy 08/23/2018   Plantar fasciitis of right foot    Dr Oneta Rack   Poor balance 02/20/2018   S/P bilateral mastectomy 08/04/2015   Septic shock ([place]) 01/31/2020   Severe sepsis with acute organ dysfunction due to gram-negative bacteria ([place]) 01/31/2020   Transfusion history 1976   Type 2 diabetes mellitus with stage 3 chronic kidney disease, without long-term current use of insulin ([place]) 08/11/2008   Ophth, Dr Kathrin Penner: no retinopathy  Diabetes  maternal grandmother    Past Surgical History:  Procedure Laterality Date   ABDOMINAL HYSTERECTOMY  1976   with BSO due to infection from [place] IUD   [place]   @ [facility] & BSO   BREAST LUMPECTOMY     [id number], [phone]   CATARACT EXTRACTION, BILATERAL Bilateral 2018   COLONOSCOPY W/ POLYPECTOMY  2006   negative 2011; Dr Carlean Purl   MASTECTOMY W/ NODES PARTIAL  2012   double mastectomy with nodes taken out on right side    PLACEMENT OF BREAST IMPLANTS  04/2011   Dr Migdalia Dk, [place]      There were no vitals filed for this visit.   Subjective Assessment - 03/08/21 1234     Subjective Fell this morning, getting ready to come here.  Was standing at my walker, and my foot got caught on the wheel, fell over to the left.  Landed on my hip, but did not hurt anything.  Husband helped me get up.  No pain.    Patient is accompained by: Family member   husband   Pertinent History PMH:  DM, HTN, Gilbert's syndrome, IBS, closed fracture pelvis, breast cancer; UTI, hx of sepsis    Patient Stated Goals Pt's goal for therapy is to stand up and walk around without the walker.  Don't want to fall again.  Also want to work on strength (husband) for getting out of chair.    Currently in Pain? No/denies                               [facility] Adult PT Treatment/Exercise - 03/08/21 0001       Transfers   Transfers Sit to Stand;Stand to Sit    Sit to Stand 5: Supervision;With upper extremity assist;From chair/3-in-1;From bed    Sit to Stand Details (indicate cue type and reason) Cues for locking brakes, fully turn to sit, hand placement    Stand to Sit To chair/3-in-1;5: Supervision;With upper extremity assist;To bed    Number of Reps Other reps (comment)   at least 5 reps throughout session     Therapeutic Activites    Therapeutic Activities Other Therapeutic Activities    Other Therapeutic Activities Discussed with pt's  husband at end of session, pt's recent fall.  Discussed and reiterated safety with transfers, turns.  Discussed making sure to keep tall posture, stay close to walker, husband may need to cue patient to remind for weigthshifting and marching to initiate stepping, turning, changes of directions.  Discussed wider BOS for standing to allow for better weightshfiting for initiation of gait and turns.             Access Code: [email or web address] URL: [email or web address] Date: 03/07/2021 Prepared by: Mady Haagensen   Exercises-Reviewed as HEP given last visit.  Pt return demo understanding with supervision and initial cues for  technique.  Side to side weightshift - 1 x daily - 5 x weekly - 1-2 sets - 10 reps Heel Toe Raises with Counter Support - 1 x daily - 5 x weekly - 1-2 sets - 10 reps Staggered Stance Forward Backward Weight Shift with Counter Support - 1 x daily - 5 x weekly - 1-2 sets - 10 reps Standing posture stretch - 1 x daily - 5 x weekly - 1-2 sets - 10 reps    Balance Exercises - 03/08/21 0001       Balance Exercises: Standing   Stepping Strategy Anterior;Posterior;Lateral;UE support;10 reps;Limitations    Stepping Strategy Limitations each leg 10 reps, cues for increased R foot clearance; then 2nd set alternating legs.  Tactile cues for increased weightshift onto RLE.    Marching Solid surface;Upper extremity assist 1;Static;Dynamic;10 reps;Limitations    Marching Limitations Marching in place, then worked on marching/weigthshifting 90 degree turns at counter, then with rollator.  Pt requires cues each time for marching/weigthshifting to clear each foot from floor with turn/change of direction.               PT Education - 03/08/21 1314     Education provided Yes    Education Details (See TA) Reviewed HEP with pt and with husband; also talked about recent fall and safety education regarding turns, locking brakes and making to avoid quick movements    Person(s)  Educated Patient    Methods Explanation;Demonstration;Verbal cues    Comprehension Verbalized understanding;Returned demonstration              PT Short Term Goals - 03/04/21 1157       PT SHORT TERM GOAL #1   Title Pt will perform HEP with family supervision for improved strength, balance, transfers, and gait.  TARGET for all STGs 04/01/2021    Time 4    Period Weeks    Status New      PT SHORT TERM GOAL #2   Title Pt will improve 5x sit<>stand to less than or equal to 15 sec to demonstrate improved functional strength and transfer efficiency.    Baseline 20.69 with UE support    Time 4    Period Weeks    Status New      PT SHORT TERM GOAL #3   Title Pt will improve TUG score to less than or equal to 18 seconds for decreased fall risk.    Baseline 23.44 sec    Time 4    Period Weeks    Status New      PT SHORT TERM GOAL #4   Title Berg Balance score to be assessed, wtih goal to be written as approrpiate.    Time 4    Period Weeks    Status New      PT SHORT TERM GOAL #5   Title Pt/husband will verbalize understanding of fall prevention in home environment.    Time 4    Period Weeks    Status New               PT Long Term Goals - 03/04/21 1200       PT LONG TERM GOAL #1   Title Pt will perform HEP with supervision of family to address balance, gait, functional mobility.  TARGET all LTGs 04/29/2021    Time 8    Period Weeks    Status New      PT LONG TERM GOAL #2   Title Pt will improve  5x sit<>stand to less than or equal to 12.5 sec to demonstrate improved functional strength and transfer efficiency.    Time 8    Period Weeks    Status New      PT LONG TERM GOAL #3   Title Pt will improve TUG score to less than or equal to 15 seconds for decreased fall risk.    Time 8    Period Weeks    Status New      PT LONG TERM GOAL #4   Title Berg score to imporve by 5 points from baseline for decreased fall rsik.    Time 8    Period Weeks    Status New       PT LONG TERM GOAL #5   Title Pt will improve gait velocity to at least 2.62 ft/sec for improved gait efficiency and safety.    Baseline 2.07 ft/sec    Time 8    Period Weeks    Status New                   Plan - 03/08/21 1514     Clinical Impression Statement Pt had fall earlier today while standing, trying to step away from her walker at home (foot caught wheel and she fell over to L side).  Pt does not have any c/o pain and does not have any limitations in movements in session today; provided education to patient and husband on ways to increase safety with transfers, static standing, and turning/transitional movements.  Educated pt/husband that pt would benefit from reminder cues for slowed movement patterns to increase safety.    Personal Factors and Comorbidities Comorbidity 3+    Comorbidities PMH:  DM, HTN, Gilbert's syndrome, IBS, closed fracture pelvis, breast cancer, hx of sepsis    Examination-Activity Limitations Locomotion Level;Transfers;Stand;Stairs    Examination-Participation Restrictions Community Activity;Meal Prep    Stability/Clinical Decision Making Evolving/Moderate complexity    Rehab Potential Good    PT Frequency 2x / week    PT Duration 8 weeks    PT Treatment/Interventions ADLs/Self Care Home Management;Gait training;Stair training;Functional mobility training;Therapeutic activities;Therapeutic exercise;Balance training;Patient/family education;Neuromuscular re-education    PT Next Visit Plan Continue to review HEP to address hip, ankle, work on step strategy for balance; turns, sit<>stand.  Fall prevention/tips to reduce festination with gait.  Work to avoid LOB in posterior direction.    Consulted and Agree with Plan of Care Patient;Family member/caregiver    Family Member Consulted husband ( at end of session)             Patient will benefit from skilled therapeutic intervention in order to improve the following deficits and impairments:   Abnormal gait, Difficulty walking, Decreased balance, Decreased mobility, Decreased strength, Postural dysfunction  Visit Diagnosis: Unsteadiness on feet  Other abnormalities of gait and mobility     Problem List Patient Active Problem List   Diagnosis Date Noted   Head injury, acute, initial encounter 02/12/2021   UTI (urinary tract infection) 01/17/2021   Aortic atherosclerosis ([place]) 08/27/2020   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   Parkinson disease ([place]) 08/26/2020   Paroxysmal atrial fibrillation ([place]) 17/51/0258   Complicated UTI (urinary tract infection) 01/31/2020   CKD (chronic kidney disease) stage 3, GFR 30-59 ml/min ([place]) 01/31/2020   Hypokalemia due to inadequate potassium intake 01/31/2020   Severe sepsis with acute organ dysfunction due to Gram negative bacteria ([place]) 01/31/2020   Episodic lightheadedness 08/27/2019   Dizziness 02/24/2019   Peripheral neuropathy  08/23/2018   Poor balance 02/20/2018   Genetic testing 09/04/2017   Family history of breast cancer    Family history of ovarian cancer    Fatty liver 08/05/2015   History of breast cancer 08/04/2015   S/P bilateral mastectomy 08/04/2015   Osteoarthritis of left knee 08/04/2015   Elevated LFTs 08/10/2014   Osteopenia 08/31/2013   Transfusion history 08/18/2013   IBS (irritable bowel syndrome) 08/18/2013   Cancer of central 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 03/08/2021, 3:18 PM Frazier Butt., PT  [facility] [address] [place], [place], [address] Phone: [phone]   Fax:  [phone]  Name: RISHIKA MCCOLLOM MRN:  [id number] Date of Birth: 08/09/1941

## 2021-03-15 ENCOUNTER — Ambulatory Visit: Payer: Medicare Other | Admitting: Physical Therapy

## 2021-03-15 ENCOUNTER — Other Ambulatory Visit: Payer: Self-pay

## 2021-03-15 DIAGNOSIS — M6281 Muscle weakness (generalized): Secondary | ICD-10-CM | POA: Diagnosis not present

## 2021-03-15 DIAGNOSIS — R2689 Other abnormalities of gait and mobility: Secondary | ICD-10-CM

## 2021-03-15 DIAGNOSIS — R29818 Other symptoms and signs involving the nervous system: Secondary | ICD-10-CM

## 2021-03-15 DIAGNOSIS — R2681 Unsteadiness on feet: Secondary | ICD-10-CM

## 2021-03-15 NOTE — Therapy (Signed)
[facility] [address] [place], [place], [address] Phone: [phone]   Fax:  [phone]  Physical Therapy Treatment  Patient Details  Name: [name] MRN: [id number] Date of Birth: 1941-02-12 Referring Provider (PT): Metta Clines, DO   Encounter Date: 03/15/2021   PT End of Session - 03/15/21 0935     Visit Number 4    Number of Visits 17    Date for PT Re-Evaluation 04/29/21    Authorization Type [employer] Medicare-Pt has been seen by [facility] this year; will likely need KX    Progress Note Due on Visit 10    PT Start Time 0933    PT Stop Time 1014    PT Time Calculation (min) 41 min    Equipment Utilized During Treatment Gait belt    Activity Tolerance Patient tolerated treatment well    Behavior During Therapy [facility] for tasks assessed/performed             Past Medical History:  Diagnosis Date   Acute renal failure superimposed on stage 3 chronic kidney disease ([place]) 01/31/2020   Breast cancer ([place]) 2012   Cancer of central portion of right female breast ([place]) 09/04/2010   Patient has a long history of fibrocystic disease. 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 She underwent right total mastectomy with sentinel node biopsy and left total (prophylactic) mastectomy on 10/12/2010. She underwent immediate reconstruction with expander and AlloDerm placement. 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 One lymph   Complicated UTI (urinary tract infection) 01/31/2020   Diabetes mellitus    Dizziness 02/24/2019   Elevated LFTs 08/10/2014   Episodic lightheadedness 08/27/2019   Esophageal reflux 05/06/2008   Essential hypertension 02/16/2009   Family history of breast cancer    Family history of ovarian cancer    Fatigue 02/24/2019   Fatty liver 08/05/2015   Genetic testing 09/04/2017   Negative genetic testing on the common hereditary cancer panel.  The  Hereditary Gene Panel offered by [facility] includes sequencing and/or deletion duplication testing of the following 47 genes: APC, ATM, AXIN2, BARD1, BMPR1A, BRCA1, BRCA2, BRIP1, CDH1, CDK4, CDKN2A (p14ARF), CDKN2A (p16INK4a), CHEK2, CTNNA1, DICER1, EPCAM (Deletion/duplication testing only), GREM1 (promoter region deletion/duplicat   GERD (gastroesophageal reflux disease)    [name] syndrome    GILBERT'S SYNDROME 02/06/2007   Qualifier: Diagnosis of  By: Janelle Floor     History of breast cancer 08/04/2015   S/p b/l mastectomy   History of colonic polyps 06/06/2010   Annotation: destroyed, no clear adenomatous proven Qualifier: Diagnosis of  By: Carlean Purl MD, Tonna Boehringer E  2008 polyp 2013 neg     Hypertension    Hypertriglyceridemia 09/30/2007   statin intolerant  Father MI @ 101 Sister CVA > 65    Hypokalemia due to inadequate potassium intake 01/31/2020   IBS (irritable bowel syndrome)    Joint pain 09/02/2010   Lactic acidosis 01/31/2020   Osteoarthritis of left knee 08/04/2015   Significant arthritis in knees, limits her activity    Osteopenia 08/31/2013   [facility]  DEXA 03/20/2018: Osteopenia:  LFN -1.3, R--1.0, spine -0.6-statistically significant increase in BMD in all areas  Findings 08/22/13 : lowest T score -  1.3  @  R femoral neck (hip) ,3.4  %loss @ R femur &   4.1 % in spine   since  2012 Dexa 01/05/16: lowest  - L femur neck T -1.6 Diagnosis: mild  Osteopenia Rx: Fosamax remotely; now on Arimidex    Parkinsonian features 01/31/2020   Peripheral neuropathy 08/23/2018   Plantar fasciitis of right foot    Dr Oneta Rack   Poor balance 02/20/2018   S/P bilateral mastectomy 08/04/2015   Septic shock ([place]) 01/31/2020   Severe sepsis with acute organ dysfunction due to gram-negative bacteria ([place]) 01/31/2020   Transfusion history 1976   Type 2 diabetes mellitus with stage 3 chronic kidney disease, without long-term current use of insulin ([place]) 08/11/2008   Ophth, Dr Kathrin Penner: no retinopathy  Diabetes  maternal grandmother    Past Surgical History:  Procedure Laterality Date   ABDOMINAL HYSTERECTOMY  1976   with BSO due to infection from [place] IUD   [place]   @ TAH & BSO   BREAST LUMPECTOMY     [id number], [phone]   CATARACT EXTRACTION, BILATERAL Bilateral 2018   COLONOSCOPY W/ POLYPECTOMY  2006   negative 2011; Dr Carlean Purl   MASTECTOMY W/ NODES PARTIAL  2012   double mastectomy with nodes taken out on right side    PLACEMENT OF BREAST IMPLANTS  04/2011   Dr Migdalia Dk, [place]      There were no vitals filed for this visit.   Subjective Assessment - 03/15/21 0935     Subjective Had a quiet weekend, no falls.  Feel like I am getting better with getting up from chairs, not needing help. Husband is working on ramp for the other door (already have one ramp)    Patient is accompained by: Family member   husband   Pertinent History PMH:  DM, HTN, Gilbert's syndrome, IBS, closed fracture pelvis, breast cancer; UTI, hx of sepsis    Patient Stated Goals Pt's goal for therapy is to stand up and walk around without the walker.  Don't want to fall again.  Also want to work on strength (husband) for getting out of chair.    Currently in Pain? No/denies                     Exercises-Reviewed HEP, per pt's request.  Pt return demo understanding with supervision and initial cues for technique.   Side to side weightshift - 1 x daily - 5 x weekly - 1-2 sets - 10 reps Heel Toe Raises with Counter Support - 1 x daily - 5 x weekly - 1-2 sets - 10 reps Staggered Stance Forward Backward Weight Shift with Counter Support - 1 x daily - 5 x weekly - 1-2 sets - 10 reps Standing posture stretch - 1 x daily - 5 x weekly - 1-2 sets - 10 reps           [facility] Adult PT Treatment/Exercise - 03/15/21 0937       Transfers   Transfers Sit to Stand;Stand to Sit    Sit to Stand 5: Supervision;With upper extremity assist;From  chair/3-in-1;From bed    Stand to Sit To chair/3-in-1;5: Supervision;With upper extremity assist;To bed      Ambulation/Gait   Ambulation/Gait Yes    Ambulation/Gait Assistance 5: Supervision;4: Min guard    Ambulation Distance (Feet) 345 Feet    Assistive device Rollator    Gait Pattern Step-through pattern;Decreased step length - right;Decreased trunk rotation;Decreased hip/knee flexion - left;Shuffle;Festinating    Ambulation Surface Level;Indoor    Curb 4: Min assist    Curb Details (indicate cue type and reason) Cues for posture, foot placement using rollator on ramp, performed  x 2 reps.    Gait Comments Cues with gait to slow pace and for foot clearance, heelstrike with RLE; cues to stop and reset x 1.                 Balance Exercises - 03/15/21 0001       Balance Exercises: Standing   Stepping Strategy Posterior;Lateral;UE support;10 reps;Limitations    Stepping Strategy Limitations each leg 10 reps, cues for increased R foot clearance; then 2nd set alternating legs.  Tactile cues for increased weightshift onto RLE for posterior and lateral weightshift.    Retro Gait Upper extremity support;3 reps;Limitations    Retro Gait Limitations Fwd/back along counter, cues for step length and foot clearance    Sidestepping Upper extremity support;3 reps;Limitations    Sidestepping Limitations along counter, cues for incr. step length    Turning Right;Left;5 reps;Limitations    Turning Limitations Practiced at counter<>walker and counter<>chair, quarter/clock turns for 90 degree turns to open up wide to step for improved truning stability.  Tactile and verbal cues provided.  Short distance gait with weightshifting turns using rollator, 20 ft x 4 reps.    Other Standing Exercises Step strategy work at counter, posterior and lateral directions, with variable direction perturbations.  PT provides verbal and tactile cues, 5-10 reps each direction, very slowed reponse RLE posterior  direction.  UE support at counter.               PT Education - 03/15/21 1156     Education provided Yes    Education Details REviewed HEP per patient's request to make sure she is performing correctly    Person(s) Educated Patient    Methods Explanation;Verbal cues;Demonstration    Comprehension Verbalized understanding;Returned demonstration;Verbal cues required              PT Short Term Goals - 03/04/21 1157       PT SHORT TERM GOAL #1   Title Pt will perform HEP with family supervision for improved strength, balance, transfers, and gait.  TARGET for all STGs 04/01/2021    Time 4    Period Weeks    Status New      PT SHORT TERM GOAL #2   Title Pt will improve 5x sit<>stand to less than or equal to 15 sec to demonstrate improved functional strength and transfer efficiency.    Baseline 20.69 with UE support    Time 4    Period Weeks    Status New      PT SHORT TERM GOAL #3   Title Pt will improve TUG score to less than or equal to 18 seconds for decreased fall risk.    Baseline 23.44 sec    Time 4    Period Weeks    Status New      PT SHORT TERM GOAL #4   Title Berg Balance score to be assessed, wtih goal to be written as approrpiate.    Time 4    Period Weeks    Status New      PT SHORT TERM GOAL #5   Title Pt/husband will verbalize understanding of fall prevention in home environment.    Time 4    Period Weeks    Status New               PT Long Term Goals - 03/04/21 1200       PT LONG TERM GOAL #1   Title Pt will perform HEP with supervision of  family to address balance, gait, functional mobility.  TARGET all LTGs 04/29/2021    Time 8    Period Weeks    Status New      PT LONG TERM GOAL #2   Title Pt will improve 5x sit<>stand to less than or equal to 12.5 sec to demonstrate improved functional strength and transfer efficiency.    Time 8    Period Weeks    Status New      PT LONG TERM GOAL #3   Title Pt will improve TUG score to less  than or equal to 15 seconds for decreased fall risk.    Time 8    Period Weeks    Status New      PT LONG TERM GOAL #4   Title Berg score to imporve by 5 points from baseline for decreased fall rsik.    Time 8    Period Weeks    Status New      PT LONG TERM GOAL #5   Title Pt will improve gait velocity to at least 2.62 ft/sec for improved gait efficiency and safety.    Baseline 2.07 ft/sec    Time 8    Period Weeks    Status New                   Plan - 03/15/21 1157     Clinical Impression Statement No falls since last visit.  Focused skilled PT session on review of HEP, continued work on stepping strategies for balance recovery.  As we worked to increase variable perturbations, pt noted to have very slow step strategy response, especially on R.  Improves with repetition and cueing.  Also worked on turns, with pt needing cues throughout.  Pt will benefit from skilled PT to further address balance and gait for improved overall functional mobility and safety with gait.    Personal Factors and Comorbidities Comorbidity 3+    Comorbidities PMH:  DM, HTN, Gilbert's syndrome, IBS, closed fracture pelvis, breast cancer, hx of sepsis    Examination-Activity Limitations Locomotion Level;Transfers;Stand;Stairs    Examination-Participation Restrictions Community Activity;Meal Prep    Stability/Clinical Decision Making Evolving/Moderate complexity    Rehab Potential Good    PT Frequency 2x / week    PT Duration 8 weeks    PT Treatment/Interventions ADLs/Self Care Home Management;Gait training;Stair training;Functional mobility training;Therapeutic activities;Therapeutic exercise;Balance training;Patient/family education;Neuromuscular re-education    PT Next Visit Plan Continue to work on hip, ankle, work on step strategy for balance-work on variable perturbations; turns, sit<>stand.  Fall prevention/tips to reduce festination with gait.  Work to avoid LOB in posterior direction.  May  need to reinforce education with husband so he can provide cueing for optimal carryover at home.    Consulted and Agree with Plan of Care Patient;Family member/caregiver    Family Member Consulted husband ( at end of session)             Patient will benefit from skilled therapeutic intervention in order to improve the following deficits and impairments:  Abnormal gait, Difficulty walking, Decreased balance, Decreased mobility, Decreased strength, Postural dysfunction  Visit Diagnosis: Unsteadiness on feet  Other abnormalities of gait and mobility  Other symptoms and signs involving the nervous system     Problem List Patient Active Problem List   Diagnosis Date Noted   Head injury, acute, initial encounter 02/12/2021   UTI (urinary tract infection) 01/17/2021   Aortic atherosclerosis ([facility]) 08/27/2020   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  Parkinson disease ([place]) 08/26/2020   Paroxysmal atrial fibrillation ([place]) 74/71/8550   Complicated UTI (urinary tract infection) 01/31/2020   CKD (chronic kidney disease) stage 3, GFR 30-59 ml/min ([facility]) 01/31/2020   Hypokalemia due to inadequate potassium intake 01/31/2020   Severe sepsis with acute organ dysfunction due to Gram negative bacteria ([place]) 01/31/2020   Episodic lightheadedness 08/27/2019   Dizziness 02/24/2019   Peripheral neuropathy 08/23/2018   Poor balance 02/20/2018   Genetic testing 09/04/2017   Family history of breast cancer    Family history of ovarian cancer    Fatty liver 08/05/2015   History of breast cancer 08/04/2015   S/P bilateral mastectomy 08/04/2015   Osteoarthritis of left knee 08/04/2015   Elevated LFTs 08/10/2014   Osteopenia 08/31/2013   Transfusion history 08/18/2013   IBS (irritable bowel syndrome) 08/18/2013   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 03/15/2021, 12:04 PM Frazier Butt., PT   [facility] [address] [place], [place], [address] Phone: [phone]   Fax:  [phone]  Name: [name] MRN: [id number] Date of Birth: [birth year]

## 2021-03-17 ENCOUNTER — Encounter: Payer: Self-pay | Admitting: Physical Therapy

## 2021-03-17 ENCOUNTER — Other Ambulatory Visit: Payer: Self-pay

## 2021-03-17 ENCOUNTER — Ambulatory Visit: Payer: Medicare Other | Admitting: Physical Therapy

## 2021-03-17 DIAGNOSIS — R2681 Unsteadiness on feet: Secondary | ICD-10-CM

## 2021-03-17 DIAGNOSIS — R29818 Other symptoms and signs involving the nervous system: Secondary | ICD-10-CM

## 2021-03-17 DIAGNOSIS — R2689 Other abnormalities of gait and mobility: Secondary | ICD-10-CM | POA: Diagnosis not present

## 2021-03-17 DIAGNOSIS — M6281 Muscle weakness (generalized): Secondary | ICD-10-CM | POA: Diagnosis not present

## 2021-03-17 NOTE — Therapy (Signed)
[name] [address] [place], [place], [address] Phone: [phone]   Fax:  [phone]  Physical Therapy Treatment  Patient Details  Name: [name] MRN: [id number] Date of Birth: Mar 02, 1941 Referring Provider (PT): Metta Clines, DO   Encounter Date: 03/17/2021   PT End of Session - 03/17/21 0931     Visit Number 5    Number of Visits 17    Date for PT Re-Evaluation 04/29/21    Authorization Type U[employer] Medicare-Pt has been seen by [facility] this year; will likely need KX    Progress Note Due on Visit 10    PT Start Time 0931    PT Stop Time 1013    PT Time Calculation (min) 42 min    Equipment Utilized During Treatment --    Activity Tolerance Patient tolerated treatment well    Behavior During Therapy [facility] for tasks assessed/performed             Past Medical History:  Diagnosis Date   Acute renal failure superimposed on stage 3 chronic kidney disease ([place]) 01/31/2020   Breast cancer ([place]) 2012   Cancer of central portion of right female breast ([place]) 09/04/2010   Patient has a long history of fibrocystic disease. 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 She underwent right total mastectomy with sentinel node biopsy and left total (prophylactic) mastectomy on 10/12/2010. She underwent immediate reconstruction with expander and AlloDerm placement. 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 One lymph   Complicated UTI (urinary tract infection) 01/31/2020   Diabetes mellitus    Dizziness 02/24/2019   Elevated LFTs 08/10/2014   Episodic lightheadedness 08/27/2019   Esophageal reflux 05/06/2008   Essential hypertension 02/16/2009   Family history of breast cancer    Family history of ovarian cancer    Fatigue 02/24/2019   Fatty liver 08/05/2015   Genetic testing 09/04/2017   Negative genetic testing on the common hereditary cancer panel.  The  Hereditary Gene Panel offered by [facility] includes sequencing and/or deletion duplication testing of the following 47 genes: APC, ATM, AXIN2, BARD1, BMPR1A, BRCA1, BRCA2, BRIP1, CDH1, CDK4, CDKN2A (p14ARF), CDKN2A (p16INK4a), CHEK2, CTNNA1, DICER1, EPCAM (Deletion/duplication testing only), GREM1 (promoter region deletion/duplicat   GERD (gastroesophageal reflux disease)    [name] syndrome    GILBERT'S SYNDROME 02/06/2007   Qualifier: Diagnosis of  By: [name]     History of breast cancer 08/04/2015   S/p b/l mastectomy   History of colonic polyps 06/06/2010   Annotation: destroyed, no clear adenomatous proven Qualifier: Diagnosis of  By: Carol Purl MD, Tonna Boehringer E  2008 polyp 2013 neg     Hypertension    Hypertriglyceridemia 09/30/2007   statin intolerant  Father MI @ 67 Sister CVA > 65    Hypokalemia due to inadequate potassium intake 01/31/2020   IBS (irritable bowel syndrome)    Joint pain 09/02/2010   Lactic acidosis 01/31/2020   Osteoarthritis of left knee 08/04/2015   Significant arthritis in knees, limits her activity    Osteopenia 08/31/2013   [facility]  DEXA 03/20/2018: Osteopenia:  LFN -1.3, R--1.0, spine -0.6-statistically significant increase in BMD in all areas  Findings 08/22/13 : lowest T score -  1.3  @  R femoral neck (hip) ,3.4  %loss @ R femur &   4.1 % in spine   since  2012 Dexa 01/05/16: lowest  - L femur neck T -1.6 Diagnosis: mild Osteopenia  Rx: Fosamax remotely; now on Arimidex    Parkinsonian features 01/31/2020   Peripheral neuropathy 08/23/2018   Plantar fasciitis of right foot    Dr Oneta Rack   Poor balance 02/20/2018   S/P bilateral mastectomy 08/04/2015   Septic shock ([place]) 01/31/2020   Severe sepsis with acute organ dysfunction due to gram-negative bacteria ([place]) 01/31/2020   Transfusion history 1976   Type 2 diabetes mellitus with stage 3 chronic kidney disease, without long-term current use of insulin ([place]) 08/11/2008   Ophth, Dr Kathrin Penner: no retinopathy  Diabetes  maternal grandmother    Past Surgical History:  Procedure Laterality Date   ABDOMINAL HYSTERECTOMY  1976   with BSO due to infection from [place] IUD   [place]   @ [facility] & BSO   BREAST LUMPECTOMY     [id number], [phone]   CATARACT EXTRACTION, BILATERAL Bilateral 2018   COLONOSCOPY W/ POLYPECTOMY  2006   negative 2011; Dr [name]   MASTECTOMY W/ NODES PARTIAL  2012   double mastectomy with nodes taken out on right side    PLACEMENT OF BREAST IMPLANTS  04/2011   Dr Migdalia Dk, [place]      There were no vitals filed for this visit.   Subjective Assessment - 03/17/21 0931     Subjective No falls, no changes.  Husband asking about scheduling more appts.    Patient is accompained by: Family member   husband   Pertinent History PMH:  DM, HTN, Gilbert's syndrome, IBS, closed fracture pelvis, breast cancer; UTI, hx of sepsis    Patient Stated Goals Pt's goal for therapy is to stand up and walk around without the walker.  Don't want to fall again.  Also want to work on strength (husband) for getting out of chair.    Currently in Pain? No/denies                               [facility] Adult PT Treatment/Exercise - 03/17/21 0001       Ambulation/Gait   Ambulation/Gait Yes    Ambulation/Gait Assistance 5: Supervision;4: Min guard    Ambulation/Gait Assistance Details Husband present and PT provides cues for posture, step length, staying close to rollator.  Cues x 3 to stop and reset as needed    Ambulation Distance (Feet) 230 Feet   x 2; 100 ft   Assistive device Rollator    Gait Pattern Step-through pattern;Decreased step length - right;Decreased trunk rotation;Decreased hip/knee flexion - left;Shuffle;Festinating    Ambulation Surface Level;Indoor    Gait Comments Practiced short distance gait and turns, with weightshifting/marching turn, x 4 reps with supervision and cues.  Figure-8 turns around cones x 2 reps,  then marching turn in place x 2 reps with rollator, cues and min guard.      Therapeutic Activites    Therapeutic Activities Other Therapeutic Activities    Other Therapeutic Activities Reviewed with husband, cues for slowed pace of gait, lateral weightshifting upon initiating wlaking, and weightshifting to pick up each foot to turn and change directions.  Cues for deliberate R foot placement/kicking for "feet under the seat" for optimal walking positioning.                 Balance Exercises - 03/17/21 0001       Balance Exercises: Standing   Stepping Strategy Posterior;Lateral;UE support;10 reps;Limitations    Stepping Strategy Limitations each leg 10 reps,  cues for increased R foot clearance; then 2nd set alternating legs.  Tactile cues for increased weightshift onto RLE for posterior and lateral weightshift.    Other Standing Exercises Step strategy work at outside of parallel bars, posterior and lateral directions, with PT providing manual variable direction perturbations at hips with hands and with blue theraband.  PT provides verbal and tactile cues, 5-10 reps each direction, slightly slowed reponse RLE posterior direction.  UE support at outside of parallel bars.               PT Education - 03/17/21 1017     Education provided Yes    Education Details Tips provided for improved safety with gait, turns, tips to reduce festination/foot catching episodes with gait; discussed how husband can provide cues    Person(s) Educated Patient;Spouse    Methods Explanation;Demonstration;Verbal cues    Comprehension Verbalized understanding;Returned demonstration;Verbal cues required              PT Short Term Goals - 03/04/21 1157       PT SHORT TERM GOAL #1   Title Pt will perform HEP with family supervision for improved strength, balance, transfers, and gait.  TARGET for all STGs 04/01/2021    Time 4    Period Weeks    Status New      PT SHORT TERM GOAL #2   Title Pt  will improve 5x sit<>stand to less than or equal to 15 sec to demonstrate improved functional strength and transfer efficiency.    Baseline 20.69 with UE support    Time 4    Period Weeks    Status New      PT SHORT TERM GOAL #3   Title Pt will improve TUG score to less than or equal to 18 seconds for decreased fall risk.    Baseline 23.44 sec    Time 4    Period Weeks    Status New      PT SHORT TERM GOAL #4   Title Berg Balance score to be assessed, wtih goal to be written as approrpiate.    Time 4    Period Weeks    Status New      PT SHORT TERM GOAL #5   Title Pt/husband will verbalize understanding of fall prevention in home environment.    Time 4    Period Weeks    Status New               PT Long Term Goals - 03/04/21 1200       PT LONG TERM GOAL #1   Title Pt will perform HEP with supervision of family to address balance, gait, functional mobility.  TARGET all LTGs 04/29/2021    Time 8    Period Weeks    Status New      PT LONG TERM GOAL #2   Title Pt will improve 5x sit<>stand to less than or equal to 12.5 sec to demonstrate improved functional strength and transfer efficiency.    Time 8    Period Weeks    Status New      PT LONG TERM GOAL #3   Title Pt will improve TUG score to less than or equal to 15 seconds for decreased fall risk.    Time 8    Period Weeks    Status New      PT LONG TERM GOAL #4   Title Berg score to imporve by 5 points from baseline for decreased  fall rsik.    Time 8    Period Weeks    Status New      PT LONG TERM GOAL #5   Title Pt will improve gait velocity to at least 2.62 ft/sec for improved gait efficiency and safety.    Baseline 2.07 ft/sec    Time 8    Period Weeks    Status New                   Plan - 03/17/21 1018     Clinical Impression Statement Husband present for part of session today for education with gait and turning safety, tips to be more deliberate with gait, turns, changing of  directions.  Discussed/demo how husband can provide cues for patient to improve safety with gait and turns.  Pt reports feeling that therapy is helping; she will continue to benefit from skilled PT to further address balance and gait towards goals.    Personal Factors and Comorbidities Comorbidity 3+    Comorbidities PMH:  DM, HTN, Gilbert's syndrome, IBS, closed fracture pelvis, breast cancer, hx of sepsis    Examination-Activity Limitations Locomotion Level;Transfers;Stand;Stairs    Examination-Participation Restrictions Community Activity;Meal Prep    Stability/Clinical Decision Making Evolving/Moderate complexity    Rehab Potential Good    PT Frequency 2x / week    PT Duration 8 weeks    PT Treatment/Interventions ADLs/Self Care Home Management;Gait training;Stair training;Functional mobility training;Therapeutic activities;Therapeutic exercise;Balance training;Patient/family education;Neuromuscular re-education    PT Next Visit Plan Continue to work on hip, ankle, work on step strategy for balance-work on variable perturbations; turns, sit<>stand.  Fall prevention/tips to reduce festination with gait.  Work to avoid LOB in posterior direction.  May need to continue to reinforce education with husband so he can provide cueing for optimal carryover at home.    Consulted and Agree with Plan of Care Patient;Family member/caregiver    Family Member Consulted husband ( at end of session)             Patient will benefit from skilled therapeutic intervention in order to improve the following deficits and impairments:  Abnormal gait, Difficulty walking, Decreased balance, Decreased mobility, Decreased strength, Postural dysfunction  Visit Diagnosis: Other abnormalities of gait and mobility  Unsteadiness on feet  Other symptoms and signs involving the nervous system     Problem List Patient Active Problem List   Diagnosis Date Noted   Head injury, acute, initial encounter 02/12/2021    UTI (urinary tract infection) 01/17/2021   Aortic atherosclerosis ([place]) 08/27/2020   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   Parkinson disease ([place]) 08/26/2020   Paroxysmal atrial fibrillation ([place]) 37/06/6268   Complicated UTI (urinary tract infection) 01/31/2020   CKD (chronic kidney disease) stage 3, GFR 30-59 ml/min ([place]) 01/31/2020   Hypokalemia due to inadequate potassium intake 01/31/2020   Severe sepsis with acute organ dysfunction due to Gram negative bacteria ([place]) 01/31/2020   Episodic lightheadedness 08/27/2019   Dizziness 02/24/2019   Peripheral neuropathy 08/23/2018   Poor balance 02/20/2018   Genetic testing 09/04/2017   Family history of breast cancer    Family history of ovarian cancer    Fatty liver 08/05/2015   History of breast cancer 08/04/2015   S/P bilateral mastectomy 08/04/2015   Osteoarthritis of left knee 08/04/2015   Elevated LFTs 08/10/2014   Osteopenia 08/31/2013   Transfusion history 08/18/2013   IBS (irritable bowel syndrome) 08/18/2013   Cancer of central 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 03/17/2021, 10:22 AM Mady Haagensen, PT 03/17/21 10:22 AM Phone: [phone] Fax: [facility] [address] [place], [place], [address] Phone: [phone]   Fax:  [phone]  Name: CYNITHIA HAKIMI MRN: [id number] Date of Birth: 28-Nov-1940

## 2021-03-22 ENCOUNTER — Encounter: Payer: Self-pay | Admitting: Physical Therapy

## 2021-03-22 ENCOUNTER — Ambulatory Visit: Payer: Medicare Other | Attending: Neurology | Admitting: Physical Therapy

## 2021-03-22 DIAGNOSIS — M6281 Muscle weakness (generalized): Secondary | ICD-10-CM | POA: Insufficient documentation

## 2021-03-22 DIAGNOSIS — R2689 Other abnormalities of gait and mobility: Secondary | ICD-10-CM

## 2021-03-22 DIAGNOSIS — R2681 Unsteadiness on feet: Secondary | ICD-10-CM | POA: Diagnosis not present

## 2021-03-22 NOTE — Therapy (Signed)
[facility] [address] [place], [place], [address] Phone: [phone]   Fax:  [phone]  Physical Therapy Treatment  Patient Details  Name: [name] MRN: [id number] Date of Birth: 1941-05-30 Referring Provider (PT): Metta Clines, DO   Encounter Date: 03/22/2021   PT End of Session - 03/22/21 1314     Visit Number 6    Number of Visits 17    Date for PT Re-Evaluation 04/29/21    Authorization Type [employer] Medicare-Pt has been seen by [facility] this year; will likely need KX    Progress Note Due on Visit 10    PT Start Time 6270    PT Stop Time 1315    PT Time Calculation (min) 40 min    Activity Tolerance Patient tolerated treatment well    Behavior During Therapy [facility] for tasks assessed/performed             Past Medical History:  Diagnosis Date   Acute renal failure superimposed on stage 3 chronic kidney disease ([place]) 01/31/2020   Breast cancer ([place]) 2012   Cancer of central portion of right female breast ([place]) 09/04/2010   Patient has a long history of fibrocystic disease. 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 She underwent right total mastectomy with sentinel node biopsy and left total (prophylactic) mastectomy on 10/12/2010. She underwent immediate reconstruction with expander and AlloDerm placement. 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 One lymph   Complicated UTI (urinary tract infection) 01/31/2020   Diabetes mellitus    Dizziness 02/24/2019   Elevated LFTs 08/10/2014   Episodic lightheadedness 08/27/2019   Esophageal reflux 05/06/2008   Essential hypertension 02/16/2009   Family history of breast cancer    Family history of ovarian cancer    Fatigue 02/24/2019   Fatty liver 08/05/2015   Genetic testing 09/04/2017   Negative genetic testing on the common hereditary cancer panel.  The Hereditary Gene Panel offered by [facility] includes  sequencing and/or deletion duplication testing of the following 47 genes: APC, ATM, AXIN2, BARD1, BMPR1A, BRCA1, BRCA2, BRIP1, CDH1, CDK4, CDKN2A (p14ARF), CDKN2A (p16INK4a), CHEK2, CTNNA1, DICER1, EPCAM (Deletion/duplication testing only), GREM1 (promoter region deletion/duplicat   GERD (gastroesophageal reflux disease)    [name] syndrome    GILBERT'S SYNDROME 02/06/2007   Qualifier: Diagnosis of  By: Janelle Floor     History of breast cancer 08/04/2015   S/p b/l mastectomy   History of colonic polyps 06/06/2010   Annotation: destroyed, no clear adenomatous proven Qualifier: Diagnosis of  By: Carlean Purl MD, Tonna Boehringer E  2008 polyp 2013 neg     Hypertension    Hypertriglyceridemia 09/30/2007   statin intolerant  Father MI @ 31 Sister CVA > 65    Hypokalemia due to inadequate potassium intake 01/31/2020   IBS (irritable bowel syndrome)    Joint pain 09/02/2010   Lactic acidosis 01/31/2020   Osteoarthritis of left knee 08/04/2015   Significant arthritis in knees, limits her activity    Osteopenia 08/31/2013   [facility]  DEXA 03/20/2018: Osteopenia:  LFN -1.3, R--1.0, spine -0.6-statistically significant increase in BMD in all areas  Findings 08/22/13 : lowest T score -  1.3  @  R femoral neck (hip) ,3.4  %loss @ R femur &   4.1 % in spine   since  2012 Dexa 01/05/16: lowest  - L femur neck T -1.6 Diagnosis: mild Osteopenia Rx: Fosamax remotely; now on Arimidex  Parkinsonian features 01/31/2020   Peripheral neuropathy 08/23/2018   Plantar fasciitis of right foot    Dr Oneta Rack   Poor balance 02/20/2018   S/P bilateral mastectomy 08/04/2015   Septic shock ([place]) 01/31/2020   Severe sepsis with acute organ dysfunction due to gram-negative bacteria ([place]) 01/31/2020   Transfusion history 1976   Type 2 diabetes mellitus with stage 3 chronic kidney disease, without long-term current use of insulin ([place]) 08/11/2008   Ophth, Dr Kathrin Penner: no retinopathy  Diabetes maternal grandmother    Past Surgical History:   Procedure Laterality Date   ABDOMINAL HYSTERECTOMY  1976   with BSO due to infection from [place] IUD   [place]   @ [facility] & BSO   BREAST LUMPECTOMY     [id number], [phone]   CATARACT EXTRACTION, BILATERAL Bilateral 2018   COLONOSCOPY W/ POLYPECTOMY  2006   negative 2011; Dr Carlean Purl   MASTECTOMY W/ NODES PARTIAL  2012   double mastectomy with nodes taken out on right side    PLACEMENT OF BREAST IMPLANTS  04/2011   Dr Migdalia Dk, [place]      There were no vitals filed for this visit.   Subjective Assessment - 03/22/21 1234     Subjective No falls, no changes.  Husband reports at end of session that pt is more confident and doing more in the house with walking.    Patient is accompained by: Family member   husband   Pertinent History PMH:  DM, HTN, Gilbert's syndrome, IBS, closed fracture pelvis, breast cancer; UTI, hx of sepsis    Patient Stated Goals Pt's goal for therapy is to stand up and walk around without the walker.  Don't want to fall again.  Also want to work on strength (husband) for getting out of chair.    Currently in Pain? No/denies                               [facility] Adult PT Treatment/Exercise - 03/22/21 1239       Transfers   Transfers Sit to Stand;Stand to Sit    Sit to Stand 5: Supervision;With upper extremity assist;From chair/3-in-1;From bed    Sit to Stand Details Tactile cues for initiation;Tactile cues for sequencing;Tactile cues for weight shifting    Stand to Sit To chair/3-in-1;5: Supervision;With upper extremity assist;To bed    Number of Reps 2 sets;Other reps (comment)   5 reps from mat   Comments Pt has several episodes of retropulsion; requires cues for increased forward lean and forward weightshift to initiate sit to stand.      Ambulation/Gait   Ambulation/Gait Yes    Ambulation/Gait Assistance 5: Supervision;4: Min guard    Ambulation Distance (Feet) 230 Feet   50  ft x 4, including turns to change directions, 100 ft   Assistive device Rollator    Gait Pattern Step-through pattern;Decreased step length - right;Decreased trunk rotation;Decreased hip/knee flexion - left;Shuffle;Festinating    Ambulation Surface Level;Indoor    Pre-Gait Activities Wtih shorter distance gait with turns, cues for increased weightshifting and lifting R foot with turn.    Gait Comments Reiterated some of the tips provided last time, as pt will have a friend staying with her later in the week, as husband will be out of town:  make sure to [place] with sit<>stand transfers with gait, SLOW pace of gait for improved R foot clearance; stop  to reset posture as needed for improved gait.  Educated pt to speak to friend that pt does not need to talk with gait, only focus on walking.  Educated husband of these tips again at end of session so he can share with friend.                 Balance Exercises - 03/22/21 1251       Balance Exercises: Standing   Stepping Strategy Posterior;Lateral;UE support;10 reps;Limitations;Anterior    Stepping Strategy Limitations each leg 10 reps, alternating legs.  Tactile cues for increased weightshift onto RLE for posterior and lateral weightshift.  For anterior step and weightshift, cues for R heelstrike.    Balance Beam Standing on beam:  alt. forward step taps/return to midline, x 10 reps, then alt. back step taps/return to midline.  Cues for R foot clearance.    Retro Gait Upper extremity support;3 reps;Limitations    Retro Gait Limitations Fwd/back along parallel bars, BUE support, cues for step length and foot clearance    Marching Foam/compliant surface;Upper extremity assist 2;Retro;Static;10 reps    Heel Raises Both;10 reps    Toe Raise Both;10 reps    Other Standing Exercises Comments Staager stance rocking forward/back x 10 reps, for ankle/hip strategy                 PT Short Term Goals - 03/04/21 1157       PT SHORT TERM  GOAL #1   Title Pt will perform HEP with family supervision for improved strength, balance, transfers, and gait.  TARGET for all STGs 04/01/2021    Time 4    Period Weeks    Status New      PT SHORT TERM GOAL #2   Title Pt will improve 5x sit<>stand to less than or equal to 15 sec to demonstrate improved functional strength and transfer efficiency.    Baseline 20.69 with UE support    Time 4    Period Weeks    Status New      PT SHORT TERM GOAL #3   Title Pt will improve TUG score to less than or equal to 18 seconds for decreased fall risk.    Baseline 23.44 sec    Time 4    Period Weeks    Status New      PT SHORT TERM GOAL #4   Title Berg Balance score to be assessed, wtih goal to be written as approrpiate.    Time 4    Period Weeks    Status New      PT SHORT TERM GOAL #5   Title Pt/husband will verbalize understanding of fall prevention in home environment.    Time 4    Period Weeks    Status New               PT Long Term Goals - 03/04/21 1200       PT LONG TERM GOAL #1   Title Pt will perform HEP with supervision of family to address balance, gait, functional mobility.  TARGET all LTGs 04/29/2021    Time 8    Period Weeks    Status New      PT LONG TERM GOAL #2   Title Pt will improve 5x sit<>stand to less than or equal to 12.5 sec to demonstrate improved functional strength and transfer efficiency.    Time 8    Period Weeks    Status New  PT LONG TERM GOAL #3   Title Pt will improve TUG score to less than or equal to 15 seconds for decreased fall risk.    Time 8    Period Weeks    Status New      PT LONG TERM GOAL #4   Title Berg score to imporve by 5 points from baseline for decreased fall rsik.    Time 8    Period Weeks    Status New      PT LONG TERM GOAL #5   Title Pt will improve gait velocity to at least 2.62 ft/sec for improved gait efficiency and safety.    Baseline 2.07 ft/sec    Time 8    Period Weeks    Status New                    Plan - 03/22/21 1457     Clinical Impression Statement Continued focus in skilled PT session today on gait and dynamic balance, for balance strategy work.  Pt continues to need cues for R foot clearance with gait at times; she also continues to demo slowed RLE step strategy with decreased foot clearance.  She will continue to benefit from skilled PT to further address balance, gait for improved overall functional mobility and decreased fall risk.  Pt only seen once this week due to holiday week and husband out of town later in the week.    Personal Factors and Comorbidities Comorbidity 3+    Comorbidities PMH:  DM, HTN, Gilbert's syndrome, IBS, closed fracture pelvis, breast cancer, hx of sepsis    Examination-Activity Limitations Locomotion Level;Transfers;Stand;Stairs    Examination-Participation Restrictions Community Activity;Meal Prep    Stability/Clinical Decision Making Evolving/Moderate complexity    Rehab Potential Good    PT Frequency 2x / week    PT Duration 8 weeks    PT Treatment/Interventions ADLs/Self Care Home Management;Gait training;Stair training;Functional mobility training;Therapeutic activities;Therapeutic exercise;Balance training;Patient/family education;Neuromuscular re-education    PT Next Visit Plan Check STGs.  Continue to work on hip, ankle, work on step strategy for balance-work on variable perturbations; turns, sit<>stand.  Update HEP as appropriate.  Add compliant surfaces.  Work to avoid LOB in posterior direction.  May need to continue to reinforce education with husband so he can provide cueing for optimal carryover at home.    Consulted and Agree with Plan of Care Patient;Family member/caregiver    Family Member Consulted husband ( at end of session)             Patient will benefit from skilled therapeutic intervention in order to improve the following deficits and impairments:  Abnormal gait, Difficulty walking, Decreased balance,  Decreased mobility, Decreased strength, Postural dysfunction  Visit Diagnosis: Other abnormalities of gait and mobility  Unsteadiness on feet     Problem List Patient Active Problem List   Diagnosis Date Noted   Head injury, acute, initial encounter 02/12/2021   UTI (urinary tract infection) 01/17/2021   Aortic atherosclerosis ([place]) 08/27/2020   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   Parkinson disease ([place]) 08/26/2020   Paroxysmal atrial fibrillation ([place]) 56/81/2751   Complicated UTI (urinary tract infection) 01/31/2020   CKD (chronic kidney disease) stage 3, GFR 30-59 ml/min ([place]) 01/31/2020   Hypokalemia due to inadequate potassium intake 01/31/2020   Severe sepsis with acute organ dysfunction due to Gram negative bacteria ([place]) 01/31/2020   Episodic lightheadedness 08/27/2019   Dizziness 02/24/2019   Peripheral neuropathy 08/23/2018   Poor balance 02/20/2018  Genetic testing 09/04/2017   Family history of breast cancer    Family history of ovarian cancer    Fatty liver 08/05/2015   History of breast cancer 08/04/2015   S/P bilateral mastectomy 08/04/2015   Osteoarthritis of left knee 08/04/2015   Elevated LFTs 08/10/2014   Osteopenia 08/31/2013   Transfusion history 08/18/2013   IBS (irritable bowel syndrome) 08/18/2013   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 03/22/2021, 3:01 PM Frazier Butt., PT  [facility] [address] [place], [place], [address] Phone: [phone]   Fax:  [phone]  Name: [name] MRN: [id number] Date of Birth: March 02, 1941

## 2021-03-24 ENCOUNTER — Ambulatory Visit: Payer: Medicare Other | Admitting: Physical Therapy

## 2021-03-28 ENCOUNTER — Other Ambulatory Visit: Payer: Self-pay

## 2021-03-28 ENCOUNTER — Ambulatory Visit: Payer: Medicare Other | Admitting: Physical Therapy

## 2021-03-28 DIAGNOSIS — R2681 Unsteadiness on feet: Secondary | ICD-10-CM

## 2021-03-28 DIAGNOSIS — R2689 Other abnormalities of gait and mobility: Secondary | ICD-10-CM

## 2021-03-28 DIAGNOSIS — M6281 Muscle weakness (generalized): Secondary | ICD-10-CM

## 2021-03-28 NOTE — Therapy (Signed)
[facility] [address] [place], [place], [address] Phone: [phone]   Fax:  [phone]  Physical Therapy Treatment  Patient Details  Name: [name] MRN: [id number] Date of Birth: 04-16-1941 Referring Provider (PT): Metta Clines, DO   Encounter Date: 03/28/2021   PT End of Session - 03/28/21 0940     Visit Number 7    Number of Visits 17    Date for PT Re-Evaluation 04/29/21    Authorization Type [employer] Medicare-Pt has been seen by [facility] this year; will likely need KX    Progress Note Due on Visit 10    PT Start Time 0940   pt arrives late   PT Stop Time 1018    PT Time Calculation (min) 38 min    Activity Tolerance Patient tolerated treatment well    Behavior During Therapy [facility] for tasks assessed/performed             Past Medical History:  Diagnosis Date   Acute renal failure superimposed on stage 3 chronic kidney disease ([place]) 01/31/2020   Breast cancer ([place]) 2012   Cancer of central portion of right female breast ([place]) 09/04/2010   Patient has a long history of fibrocystic disease. 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 She underwent right total mastectomy with sentinel node biopsy and left total (prophylactic) mastectomy on 10/12/2010. She underwent immediate reconstruction with expander and AlloDerm placement. 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 One lymph   Complicated UTI (urinary tract infection) 01/31/2020   Diabetes mellitus    Dizziness 02/24/2019   Elevated LFTs 08/10/2014   Episodic lightheadedness 08/27/2019   Esophageal reflux 05/06/2008   Essential hypertension 02/16/2009   Family history of breast cancer    Family history of ovarian cancer    Fatigue 02/24/2019   Fatty liver 08/05/2015   Genetic testing 09/04/2017   Negative genetic testing on the common hereditary cancer panel.  The Hereditary Gene Panel offered by  [facility] includes sequencing and/or deletion duplication testing of the following 47 genes: APC, ATM, AXIN2, BARD1, BMPR1A, BRCA1, BRCA2, BRIP1, CDH1, CDK4, CDKN2A (p14ARF), CDKN2A (p16INK4a), CHEK2, CTNNA1, DICER1, EPCAM (Deletion/duplication testing only), GREM1 (promoter region deletion/duplicat   GERD (gastroesophageal reflux disease)    [name] syndrome    GILBERT'S SYNDROME 02/06/2007   Qualifier: Diagnosis of  By: Janelle Floor     History of breast cancer 08/04/2015   S/p b/l mastectomy   History of colonic polyps 06/06/2010   Annotation: destroyed, no clear adenomatous proven Qualifier: Diagnosis of  By: Carlean Purl MD, Tonna Boehringer E  2008 polyp 2013 neg     Hypertension    Hypertriglyceridemia 09/30/2007   statin intolerant  Father MI @ 51 Sister CVA > 65    Hypokalemia due to inadequate potassium intake 01/31/2020   IBS (irritable bowel syndrome)    Joint pain 09/02/2010   Lactic acidosis 01/31/2020   Osteoarthritis of left knee 08/04/2015   Significant arthritis in knees, limits her activity    Osteopenia 08/31/2013   [facility]  DEXA 03/20/2018: Osteopenia:  LFN -1.3, R--1.0, spine -0.6-statistically significant increase in BMD in all areas  Findings 08/22/13 : lowest T score -  1.3  @  R femoral neck (hip) ,3.4  %loss @ R femur &   4.1 % in spine   since  2012 Dexa 01/05/16: lowest  - L femur neck T -1.6 Diagnosis: mild Osteopenia Rx: Fosamax remotely; now  on Arimidex    Parkinsonian features 01/31/2020   Peripheral neuropathy 08/23/2018   Plantar fasciitis of right foot    Dr Oneta Rack   Poor balance 02/20/2018   S/P bilateral mastectomy 08/04/2015   Septic shock ([place]) 01/31/2020   Severe sepsis with acute organ dysfunction due to gram-negative bacteria ([place]) 01/31/2020   Transfusion history 1976   Type 2 diabetes mellitus with stage 3 chronic kidney disease, without long-term current use of insulin ([place]) 08/11/2008   Ophth, Dr Kathrin Penner: no retinopathy  Diabetes maternal grandmother    Past  Surgical History:  Procedure Laterality Date   ABDOMINAL HYSTERECTOMY  1976   with BSO due to infection from [place] IUD   [place]   @ [facility] & BSO   BREAST LUMPECTOMY     [id number], [phone]   CATARACT EXTRACTION, BILATERAL Bilateral 2018   COLONOSCOPY W/ POLYPECTOMY  2006   negative 2011; Dr Carlean Purl   MASTECTOMY W/ NODES PARTIAL  2012   double mastectomy with nodes taken out on right side    PLACEMENT OF BREAST IMPLANTS  04/2011   Dr Migdalia Dk, [place]      There were no vitals filed for this visit.   Subjective Assessment - 03/28/21 0940     Subjective No falls, no changes.    Patient is accompained by: Family member   husband   Pertinent History PMH:  DM, HTN, Gilbert's syndrome, IBS, closed fracture pelvis, breast cancer; UTI, hx of sepsis    Patient Stated Goals Pt's goal for therapy is to stand up and walk around without the walker.  Don't want to fall again.  Also want to work on strength (husband) for getting out of chair.    Currently in Pain? No/denies                               [facility] Adult PT Treatment/Exercise - 03/28/21 0001       Transfers   Transfers Sit to Stand;Stand to Sit    Sit to Stand 5: Supervision;With upper extremity assist;From chair/3-in-1;From bed    Sit to Stand Details Tactile cues for initiation;Tactile cues for sequencing;Tactile cues for weight shifting    Stand to Sit To chair/3-in-1;5: Supervision;With upper extremity assist;To bed    Stand to Sit Details (indicate cue type and reason) Tactile cues for initiation;Tactile cues for sequencing;Tactile cues for weight shifting    Stand to Sit Details Cues to keep weight through feet, squat to sit.    Number of Reps 2 sets;Other reps (comment)   5 reps from mat     Ambulation/Gait   Ambulation/Gait Yes    Ambulation/Gait Assistance 5: Supervision;4: Min guard    Ambulation Distance (Feet) 230 Feet   x 2   Assistive  device Rollator    Gait Pattern Step-through pattern;Decreased step length - right;Decreased trunk rotation;Decreased hip/knee flexion - left;Shuffle;Festinating    Ambulation Surface Level;Indoor    Gait Comments Second bout of gait with turns around furniture using rollator.  Cues to keep rollator close and keep long strides with gait.      Standardized Balance Assessment   Standardized Balance Assessment Berg Balance Test      Berg Balance Test   Sit to Stand Able to stand without using hands and stabilize independently    Standing Unsupported Able to stand safely 2 minutes    Sitting with Back Unsupported but  Feet Supported on Floor or Stool Able to sit safely and securely 2 minutes    Stand to Sit Sits independently, has uncontrolled descent    Transfers Able to transfer with verbal cueing and /or supervision    Standing Unsupported with Eyes Closed Able to stand 10 seconds with supervision    Standing Ubsupported with Feet Together Needs help to attain position but able to stand for 30 seconds with feet together    From Standing, Reach Forward with Outstretched Arm Can reach forward >12 cm safely (5")    From Standing Position, Pick up Object from [place] to pick up shoe, needs supervision    From Standing Position, Turn to Look Behind Over each Shoulder Looks behind from both sides and weight shifts well    Turn 360 Degrees Needs close supervision or verbal cueing    Standing Unsupported, Alternately Place Feet on Step/Stool Able to complete >2 steps/needs minimal assist    Standing Unsupported, One Foot in [place] to take small step independently and hold 30 seconds    Standing on One Leg Tries to lift leg/unable to hold 3 seconds but remains standing independently    Total Score 34      Therapeutic Activites    Therapeutic Activities Other Therapeutic Activities    Other Therapeutic Activities Hands on green therapy ball in front of patient, partialy sit to stand, emphasis  on squat back down into sitting.  Then with green therapy ball, forward pushing it out and in x 10 reps to emphasize hinge at hips in sitting.      Exercises   Exercises Knee/Hip      Knee/Hip Exercises: Aerobic   Nustep NuStep, Level 2, 4 extremities x 5 minutes, for strength, flexibility through BLEs.  Cues for increasing steps/min to >40-50.  Pt able to maintain this speed throughout.                      PT Short Term Goals - 03/28/21 0957       PT SHORT TERM GOAL #1   Title Pt will perform HEP with family supervision for improved strength, balance, transfers, and gait.  TARGET for all STGs 04/01/2021    Time 4    Period Weeks    Status New      PT SHORT TERM GOAL #2   Title Pt will improve 5x sit<>stand to less than or equal to 15 sec to demonstrate improved functional strength and transfer efficiency.    Baseline 20.69 with UE support    Time 4    Period Weeks    Status New      PT SHORT TERM GOAL #3   Title Pt will improve TUG score to less than or equal to 18 seconds for decreased fall risk.    Baseline 23.44 sec    Time 4    Period Weeks    Status New      PT SHORT TERM GOAL #4   Title Berg Balance score to be assessed, wtih goal to be written as approrpiate.    Baseline 03/28/21:  34/56 initial    Time 4    Period Weeks    Status Achieved      PT SHORT TERM GOAL #5   Title Pt/husband will verbalize understanding of fall prevention in home environment.    Time 4    Period Weeks    Status New  PT Long Term Goals - 03/28/21 0958       PT LONG TERM GOAL #1   Title Pt will perform HEP with supervision of family to address balance, gait, functional mobility.  TARGET all LTGs 04/29/2021    Time 8    Period Weeks    Status New      PT LONG TERM GOAL #2   Title Pt will improve 5x sit<>stand to less than or equal to 12.5 sec to demonstrate improved functional strength and transfer efficiency.    Time 8    Period Weeks    Status  New      PT LONG TERM GOAL #3   Title Pt will improve TUG score to less than or equal to 15 seconds for decreased fall risk.    Time 8    Period Weeks    Status New      PT LONG TERM GOAL #4   Title Berg score to imporve by 5 points from baseline for decreased fall rsik.    Baseline 34/56 initial    Time 8    Period Weeks    Status New      PT LONG TERM GOAL #5   Title Pt will improve gait velocity to at least 2.62 ft/sec for improved gait efficiency and safety.    Baseline 2.07 ft/sec    Time 8    Period Weeks    Status New                   Plan - 03/28/21 1014     Clinical Impression Statement Assessed Berg Balance test today, with score 34/56 indicating increased fall risk.  Additional gait training and transfer training, as well as use of aerobic machine for flexibility and lower extremity strength.  Repetition and practice with sit<>stand to help lessen retropulsion upon stand>sit.  Pt is progressing towards goals and remains motivated for therapy.  Plan to fully assess STGs next visit.    Personal Factors and Comorbidities Comorbidity 3+    Comorbidities PMH:  DM, HTN, Gilbert's syndrome, IBS, closed fracture pelvis, breast cancer, hx of sepsis    Examination-Activity Limitations Locomotion Level;Transfers;Stand;Stairs    Examination-Participation Restrictions Community Activity;Meal Prep    Stability/Clinical Decision Making Evolving/Moderate complexity    Rehab Potential Good    PT Frequency 2x / week    PT Duration 8 weeks    PT Treatment/Interventions ADLs/Self Care Home Management;Gait training;Stair training;Functional mobility training;Therapeutic activities;Therapeutic exercise;Balance training;Patient/family education;Neuromuscular re-education    PT Next Visit Plan Check STGs.  Continue to work on hip, ankle, work on step strategy for balance-work on variable perturbations; turns, sit<>stand.  Update HEP as appropriate.  Add compliant surfaces.  Work to  avoid LOB in posterior direction.  May need to continue to reinforce education with husband so he can provide cueing for optimal carryover at home.    Consulted and Agree with Plan of Care Patient;Family member/caregiver    Family Member Consulted husband ( at end of session)             Patient will benefit from skilled therapeutic intervention in order to improve the following deficits and impairments:  Abnormal gait, Difficulty walking, Decreased balance, Decreased mobility, Decreased strength, Postural dysfunction  Visit Diagnosis: Unsteadiness on feet  Muscle weakness (generalized)  Other abnormalities of gait and mobility     Problem List Patient Active Problem List   Diagnosis Date Noted   Head injury, acute, initial encounter 02/12/2021  UTI (urinary tract infection) 01/17/2021   Aortic atherosclerosis ([place]) 08/27/2020   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   Parkinson disease ([place]) 08/26/2020   Paroxysmal atrial fibrillation ([place]) 91/47/8295   Complicated UTI (urinary tract infection) 01/31/2020   CKD (chronic kidney disease) stage 3, GFR 30-59 ml/min ([facility]) 01/31/2020   Hypokalemia due to inadequate potassium intake 01/31/2020   Severe sepsis with acute organ dysfunction due to Gram negative bacteria ([place]) 01/31/2020   Episodic lightheadedness 08/27/2019   Dizziness 02/24/2019   Peripheral neuropathy 08/23/2018   Poor balance 02/20/2018   Genetic testing 09/04/2017   Family history of breast cancer    Family history of ovarian cancer    Fatty liver 08/05/2015   History of breast cancer 08/04/2015   S/P bilateral mastectomy 08/04/2015   Osteoarthritis of left knee 08/04/2015   Elevated LFTs 08/10/2014   Osteopenia 08/31/2013   Transfusion history 08/18/2013   IBS (irritable bowel syndrome) 08/18/2013   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 03/28/2021, 10:17 AM Frazier Butt., PT  [facility] [address] [place], [place], [address] Phone: [phone]   Fax:  [phone]  Name: [name] MRN: [id number] Date of Birth: [birth year]

## 2021-03-31 ENCOUNTER — Encounter: Payer: Self-pay | Admitting: Physical Therapy

## 2021-03-31 ENCOUNTER — Other Ambulatory Visit: Payer: Self-pay

## 2021-03-31 ENCOUNTER — Ambulatory Visit: Payer: Medicare Other | Admitting: Physical Therapy

## 2021-03-31 DIAGNOSIS — M6281 Muscle weakness (generalized): Secondary | ICD-10-CM | POA: Diagnosis not present

## 2021-03-31 DIAGNOSIS — R2689 Other abnormalities of gait and mobility: Secondary | ICD-10-CM

## 2021-03-31 DIAGNOSIS — R2681 Unsteadiness on feet: Secondary | ICD-10-CM | POA: Diagnosis not present

## 2021-03-31 NOTE — Patient Instructions (Addendum)
Access Code: [email or web address] URL: [email or web address] Date: 03/31/2021 Prepared by: Mady Haagensen  Exercises Side to side weightshift - 1 x daily - 5 x weekly - 1-2 sets - 10 reps Heel Toe Raises with Counter Support - 1 x daily - 5 x weekly - 1-2 sets - 10 reps Staggered Stance Forward Backward Weight Shift with Counter Support - 1 x daily - 5 x weekly - 1-2 sets - 10 reps Standing posture stretch - 1 x daily - 5 x weekly - 1-2 sets - 10 reps  Added 03/31/2021: Side Stepping with Counter Support - 1 x daily - 5 x weekly - 1-2 sets - 10 reps Alternating Step Backward with Support - 1 x daily - 5 x weekly - 1-2 sets - 10 reps  It is important to avoid accidents which may result in broken bones.  Here are a few ideas on how to make your home safer so you will be less likely to trip or fall.  Use nonskid mats or non slip strips in your shower or tub, on your bathroom floor and around sinks.  If you know that you have spilled water, wipe it up! In the bathroom, it is important to have properly installed grab bars on the walls or on the edge of the tub.  Towel racks are NOT strong enough for you to hold onto or to pull on for support. Stairs and hallways should have enough light.  Add lamps or night lights if you need ore light. It is good to have handrails on both sides of the stairs if possible.  Always fix broken handrails right away. It is important to see the edges of steps.  Paint the edges of outdoor steps white so you can see them better.  Put colored tape on the edge of inside steps. Throw-rugs are dangerous because they can slide.  Removing the rugs is the best idea, but if they must stay, add adhesive carpet tape to prevent slipping. Do not keep things on stairs or in the halls.  Remove small furniture that blocks the halls as it may cause you to trip.  Keep telephone and electrical cords out of the way where you walk. Always were sturdy, rubber-soled shoes for good support.   Never wear just socks, especially on the stairs.  Socks may cause you to slip or fall.  Do not wear full-length housecoats as you can easily trip on the bottom.  Place the things you use the most on the shelves that are the easiest to reach.  If you use a stepstool, make sure it is in good condition.  If you feel unsteady, DO NOT climb, ask for help. If a health professional advises you to use a cane or walker, do not be ashamed.  These items can keep you from falling and breaking your bones.

## 2021-03-31 NOTE — Therapy (Signed)
Person [address] [place], [place], [address] Phone: [phone]   Fax:  [phone]  Physical Therapy Treatment  Patient Details  Name: [name] MRN: [id number] Date of Birth: 01/06/1941 Referring Provider (PT): Metta Clines, DO   Encounter Date: 03/31/2021   PT End of Session - 03/31/21 0935     Visit Number 8    Number of Visits 17    Date for PT Re-Evaluation 04/29/21    Authorization Type UHC Medicare-Pt has been seen by [facility] this year; will likely need KX    Progress Note Due on Visit 10    PT Start Time 0935    PT Stop Time 1014    PT Time Calculation (min) 39 min    Activity Tolerance Patient tolerated treatment well    Behavior During Therapy [facility] for tasks assessed/performed             Past Medical History:  Diagnosis Date   Acute renal failure superimposed on stage 3 chronic kidney disease ([place]) 01/31/2020   Breast cancer ([place]) 2012   Cancer of central portion of right female breast ([place]) 09/04/2010   Patient has a long history of fibrocystic disease. 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 She underwent right total mastectomy with sentinel node biopsy and left total (prophylactic) mastectomy on 10/12/2010. She underwent immediate reconstruction with expander and AlloDerm placement. 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 One lymph   Complicated UTI (urinary tract infection) 01/31/2020   Diabetes mellitus    Dizziness 02/24/2019   Elevated LFTs 08/10/2014   Episodic lightheadedness 08/27/2019   Esophageal reflux 05/06/2008   Essential hypertension 02/16/2009   Family history of breast cancer    Family history of ovarian cancer    Fatigue 02/24/2019   Fatty liver 08/05/2015   Genetic testing 09/04/2017   Negative genetic testing on the common hereditary cancer panel.  The Hereditary Gene Panel offered by [facility] includes  sequencing and/or deletion duplication testing of the following 47 genes: APC, ATM, AXIN2, BARD1, BMPR1A, BRCA1, BRCA2, BRIP1, CDH1, CDK4, CDKN2A (p14ARF), CDKN2A (p16INK4a), CHEK2, CTNNA1, DICER1, EPCAM (Deletion/duplication testing only), GREM1 (promoter region deletion/duplicat   GERD (gastroesophageal reflux disease)    [name] syndrome    GILBERT'S SYNDROME 02/06/2007   Qualifier: Diagnosis of  By: Janelle Floor     History of breast cancer 08/04/2015   S/p b/l mastectomy   History of colonic polyps 06/06/2010   Annotation: destroyed, no clear adenomatous proven Qualifier: Diagnosis of  By: Carlean Purl MD, Tonna Boehringer E  2008 polyp 2013 neg     Hypertension    Hypertriglyceridemia 09/30/2007   statin intolerant  Father MI @ 70 Sister CVA > 65    Hypokalemia due to inadequate potassium intake 01/31/2020   IBS (irritable bowel syndrome)    Joint pain 09/02/2010   Lactic acidosis 01/31/2020   Osteoarthritis of left knee 08/04/2015   Significant arthritis in knees, limits her activity    Osteopenia 08/31/2013   [facility]  DEXA 03/20/2018: Osteopenia:  LFN -1.3, R--1.0, spine -0.6-statistically significant increase in BMD in all areas  Findings 08/22/13 : lowest T score -  1.3  @  R femoral neck (hip) ,3.4  %loss @ R femur &   4.1 % in spine   since  2012 Dexa 01/05/16: lowest  - L femur neck T -1.6 Diagnosis: mild Osteopenia Rx: Fosamax remotely; now on Arimidex  Parkinsonian features 01/31/2020   Peripheral neuropathy 08/23/2018   Plantar fasciitis of right foot    Dr Oneta Rack   Poor balance 02/20/2018   S/P bilateral mastectomy 08/04/2015   Septic shock ([place]) 01/31/2020   Severe sepsis with acute organ dysfunction due to gram-negative bacteria ([place]) 01/31/2020   Transfusion history 1976   Type 2 diabetes mellitus with stage 3 chronic kidney disease, without long-term current use of insulin ([place]) 08/11/2008   Ophth, Dr Kathrin Penner: no retinopathy  Diabetes maternal grandmother    Past Surgical History:   Procedure Laterality Date   ABDOMINAL HYSTERECTOMY  1976   with BSO due to infection from [place] IUD   [facility]   @ [facility] & BSO   BREAST LUMPECTOMY     [id number], [phone]   CATARACT EXTRACTION, BILATERAL Bilateral 2018   COLONOSCOPY W/ POLYPECTOMY  2006   negative 2011; Dr Carlean Purl   MASTECTOMY W/ NODES PARTIAL  2012   double mastectomy with nodes taken out on right side    PLACEMENT OF BREAST IMPLANTS  04/2011   Dr Migdalia Dk, [place]      There were no vitals filed for this visit.   Subjective Assessment - 03/31/21 0935     Subjective No changes since last visit.  Everything is fine.    Patient is accompained by: Family member   husband   Pertinent History PMH:  DM, HTN, Gilbert's syndrome, IBS, closed fracture pelvis, breast cancer; UTI, hx of sepsis    Patient Stated Goals Pt's goal for therapy is to stand up and walk around without the walker.  Don't want to fall again.  Also want to work on strength (husband) for getting out of chair.    Currently in Pain? No/denies                 Access Code: [email or web address] URL: [email or web address] Date: 03/31/2021 Prepared by: Mady Haagensen  Exercises-reviewed HEP and pt return demo understanding  Side to side weightshift - 1 x daily - 5 x weekly - 1-2 sets - 10 reps Heel Toe Raises with Counter Support - 1 x daily - 5 x weekly - 1-2 sets - 10 reps Staggered Stance Forward Backward Weight Shift with Counter Support - 1 x daily - 5 x weekly - 1-2 sets - 10 reps Standing posture stretch - 1 x daily - 5 x weekly - 1-2 sets - 10 reps              [facility] Adult PT Treatment/Exercise - 03/31/21 0001       Transfers   Transfers Sit to Stand;Stand to Sit    Sit to Stand 5: Supervision;With upper extremity assist;From chair/3-in-1    Five time sit to stand comments  22 seconds, good form and technique    Stand to Sit To chair/3-in-1;5: Supervision;With  upper extremity assist    Number of Reps 2 sets;Other reps (comment)   5 reps     Ambulation/Gait   Ambulation/Gait Yes    Ambulation/Gait Assistance 5: Supervision    Ambulation Distance (Feet) 100 Feet   115   Assistive device Rollator    Gait Pattern Step-through pattern;Decreased step length - right;Decreased trunk rotation;Decreased hip/knee flexion - left;Shuffle;Festinating    Ambulation Surface Level;Indoor      Standardized Balance Assessment   Standardized Balance Assessment Timed Up and Go Test      Timed Up and Go Test   TUG Normal TUG  Normal TUG (seconds) 22.03   20.47, 22.44 sec avg = 21.65 sec     Self-Care   Self-Care Other Self-Care Comments    Other Self-Care Comments  Fall prevention education provided, discussed POC and progress to goals                 Balance Exercises - 03/31/21 0001       Balance Exercises: Standing   Stepping Strategy Posterior;Lateral;UE support;10 reps;Limitations               PT Education - 03/31/21 1143     Education provided Yes    Education Details Fall prevention, HEP additions    Person(s) Educated Patient    Methods Explanation;Demonstration;Handout;Verbal cues    Comprehension Verbalized understanding;Returned demonstration;Verbal cues required;Need further instruction              PT Short Term Goals - 03/31/21 0938       PT SHORT TERM GOAL #1   Title Pt will perform HEP with family supervision for improved strength, balance, transfers, and gait.  TARGET for all STGs 04/01/2021    Time 4    Period Weeks    Status Achieved      PT SHORT TERM GOAL #2   Title Pt will improve 5x sit<>stand to less than or equal to 15 sec to demonstrate improved functional strength and transfer efficiency.    Baseline 20.69 with UE support; 22 secs with better form/technique    Time 4    Period Weeks    Status Not Met      PT SHORT TERM GOAL #3   Title Pt will improve TUG score to less than or equal to 18  seconds for decreased fall risk.    Baseline 23.44 sec; 21.65 sec avg    Time 4    Period Weeks    Status Not Met      PT SHORT TERM GOAL #4   Title Berg Balance score to be assessed, wtih goal to be written as approrpiate.    Baseline 03/28/21:  34/56 initial    Time 4    Period Weeks    Status Achieved      PT SHORT TERM GOAL #5   Title Pt/husband will verbalize understanding of fall prevention in home environment.    Time 4    Period Weeks    Status Achieved               PT Long Term Goals - 03/28/21 5520       PT LONG TERM GOAL #1   Title Pt will perform HEP with supervision of family to address balance, gait, functional mobility.  TARGET all LTGs 04/29/2021    Time 8    Period Weeks    Status New      PT LONG TERM GOAL #2   Title Pt will improve 5x sit<>stand to less than or equal to 12.5 sec to demonstrate improved functional strength and transfer efficiency.    Time 8    Period Weeks    Status New      PT LONG TERM GOAL #3   Title Pt will improve TUG score to less than or equal to 15 seconds for decreased fall risk.    Time 8    Period Weeks    Status New      PT LONG TERM GOAL #4   Title Berg score to imporve by 5 points from baseline for decreased fall rsik.  Baseline 34/56 initial    Time 8    Period Weeks    Status New      PT LONG TERM GOAL #5   Title Pt will improve gait velocity to at least 2.62 ft/sec for improved gait efficiency and safety.    Baseline 2.07 ft/sec    Time 8    Period Weeks    Status New                   Plan - 03/31/21 0935     Clinical Impression Statement STGs assessed today, with pt meeting 3 of 5 STGs.  Pt has met STG 1 for HEP, STG 4 for Berg assessed last visit; STG 5 for fall prevention.  STG 2 and 3 not met for 5x sit<>stand, but pt is moving with more control and slower pace for better movement patterns.  Pt does remain at fall risk and would cotninue to benefit from furhter skilled PT to address  functional strength, balance, and gait for improved functional mobility and decreased fall risk.    Personal Factors and Comorbidities Comorbidity 3+    Comorbidities PMH:  DM, HTN, Gilbert's syndrome, IBS, closed fracture pelvis, breast cancer, hx of sepsis    Examination-Activity Limitations Locomotion Level;Transfers;Stand;Stairs    Examination-Participation Restrictions Community Activity;Meal Prep    Stability/Clinical Decision Making Evolving/Moderate complexity    Rehab Potential Good    PT Frequency 2x / week    PT Duration 8 weeks    PT Treatment/Interventions ADLs/Self Care Home Management;Gait training;Stair training;Functional mobility training;Therapeutic activities;Therapeutic exercise;Balance training;Patient/family education;Neuromuscular re-education    PT Next Visit Plan Review HEP additions, continue to work on hip/ankle/step strategy for balance-work on more variable perturvations, turns/figure 8 with gait.  Work towards [employer].    Consulted and Agree with Plan of Care Patient;Family member/caregiver    Family Member Consulted husband ( at end of session)             Patient will benefit from skilled therapeutic intervention in order to improve the following deficits and impairments:  Abnormal gait, Difficulty walking, Decreased balance, Decreased mobility, Decreased strength, Postural dysfunction  Visit Diagnosis: Unsteadiness on feet  Muscle weakness (generalized)  Other abnormalities of gait and mobility     Problem List Patient Active Problem List   Diagnosis Date Noted   Head injury, acute, initial encounter 02/12/2021   UTI (urinary tract infection) 01/17/2021   Aortic atherosclerosis ([place]) 08/27/2020   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   Parkinson disease ([place]) 08/26/2020   Paroxysmal atrial fibrillation ([place]) 68/07/5725   Complicated UTI (urinary tract infection) 01/31/2020   CKD (chronic kidney disease) stage 3, GFR 30-59 ml/min ([facility]) 01/31/2020    Hypokalemia due to inadequate potassium intake 01/31/2020   Severe sepsis with acute organ dysfunction due to Gram negative bacteria ([place]) 01/31/2020   Episodic lightheadedness 08/27/2019   Dizziness 02/24/2019   Peripheral neuropathy 08/23/2018   Poor balance 02/20/2018   Genetic testing 09/04/2017   Family history of breast cancer    Family history of ovarian cancer    Fatty liver 08/05/2015   History of breast cancer 08/04/2015   S/P bilateral mastectomy 08/04/2015   Osteoarthritis of left knee 08/04/2015   Elevated LFTs 08/10/2014   Osteopenia 08/31/2013   Transfusion history 08/18/2013   IBS (irritable bowel syndrome) 08/18/2013   Cancer of central portion of right female breast ([place]) 09/04/2010   Joint pain 09/02/2010   History of colonic polyps 06/06/2010   Essential hypertension  02/16/2009   Type 2 diabetes mellitus with stage 3 chronic kidney disease, without long-term current use of insulin ([place]) 08/11/2008   Hypertriglyceridemia 09/30/2007   GILBERT'S SYNDROME 02/06/2007    Mistie Adney W. 03/31/2021, 11:49 AM Frazier Butt., PT   [place] [address] [place], [place], [address] Phone: [phone]   Fax:  [phone]  Name: [name] MRN: [id number] Date of Birth: 1940/09/20

## 2021-04-04 ENCOUNTER — Ambulatory Visit: Payer: Medicare Other | Admitting: Physical Therapy

## 2021-04-04 ENCOUNTER — Other Ambulatory Visit: Payer: Self-pay

## 2021-04-04 DIAGNOSIS — M6281 Muscle weakness (generalized): Secondary | ICD-10-CM | POA: Diagnosis not present

## 2021-04-04 DIAGNOSIS — R2689 Other abnormalities of gait and mobility: Secondary | ICD-10-CM | POA: Diagnosis not present

## 2021-04-04 DIAGNOSIS — R2681 Unsteadiness on feet: Secondary | ICD-10-CM | POA: Diagnosis not present

## 2021-04-04 NOTE — Therapy (Signed)
[facility] [address] [place], [place], [address] Phone: [phone]   Fax:  [phone]  Physical Therapy Treatment  Patient Details  Name: [name] MRN: [id number] Date of Birth: 06-May-1941 Referring Provider (PT): Metta Clines, DO   Encounter Date: 04/04/2021   PT End of Session - 04/04/21 1028     Visit Number 9    Number of Visits 17    Date for PT Re-Evaluation 04/29/21    Authorization Type [employer] Medicare-Pt has been seen by [facility] this year; will likely need KX    Progress Note Due on Visit 10    PT Start Time [phone]   pt arrives late   PT Stop Time 1015    PT Time Calculation (min) 38 min    Activity Tolerance Patient tolerated treatment well    Behavior During Therapy [facility] for tasks assessed/performed;Flat affect             Past Medical History:  Diagnosis Date   Acute renal failure superimposed on stage 3 chronic kidney disease ([place]) 01/31/2020   Breast cancer ([place]) 2012   Cancer of central portion of right female breast ([place]) 09/04/2010   Patient has a long history of fibrocystic disease. 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 She underwent right total mastectomy with sentinel node biopsy and left total (prophylactic) mastectomy on 10/12/2010. She underwent immediate reconstruction with expander and AlloDerm placement. 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 One lymph   Complicated UTI (urinary tract infection) 01/31/2020   Diabetes mellitus    Dizziness 02/24/2019   Elevated LFTs 08/10/2014   Episodic lightheadedness 08/27/2019   Esophageal reflux 05/06/2008   Essential hypertension 02/16/2009   Family history of breast cancer    Family history of ovarian cancer    Fatigue 02/24/2019   Fatty liver 08/05/2015   Genetic testing 09/04/2017   Negative genetic testing on the common hereditary cancer panel.  The Hereditary Gene  Panel offered by [facility] includes sequencing and/or deletion duplication testing of the following 47 genes: APC, ATM, AXIN2, BARD1, BMPR1A, BRCA1, BRCA2, BRIP1, CDH1, CDK4, CDKN2A (p14ARF), CDKN2A (p16INK4a), CHEK2, CTNNA1, DICER1, EPCAM (Deletion/duplication testing only), GREM1 (promoter region deletion/duplicat   GERD (gastroesophageal reflux disease)    [name] syndrome    GILBERT'S SYNDROME 02/06/2007   Qualifier: Diagnosis of  By: Janelle Floor     History of breast cancer 08/04/2015   S/p b/l mastectomy   History of colonic polyps 06/06/2010   Annotation: destroyed, no clear adenomatous proven Qualifier: Diagnosis of  By: Carlean Purl MD, Tonna Boehringer E  2008 polyp 2013 neg     Hypertension    Hypertriglyceridemia 09/30/2007   statin intolerant  Father MI @ 19 Sister CVA > 65    Hypokalemia due to inadequate potassium intake 01/31/2020   IBS (irritable bowel syndrome)    Joint pain 09/02/2010   Lactic acidosis 01/31/2020   Osteoarthritis of left knee 08/04/2015   Significant arthritis in knees, limits her activity    Osteopenia 08/31/2013   [facility]  DEXA 03/20/2018: Osteopenia:  LFN -1.3, R--1.0, spine -0.6-statistically significant increase in BMD in all areas  Findings 08/22/13 : lowest T score -  1.3  @  R femoral neck (hip) ,3.4  %loss @ R femur &   4.1 % in spine   since  2012 Dexa 01/05/16: lowest  - L femur neck T -1.6 Diagnosis: mild Osteopenia Rx: Fosamax remotely;  now on Arimidex    Parkinsonian features 01/31/2020   Peripheral neuropathy 08/23/2018   Plantar fasciitis of right foot    Dr Oneta Rack   Poor balance 02/20/2018   S/P bilateral mastectomy 08/04/2015   Septic shock ([place]) 01/31/2020   Severe sepsis with acute organ dysfunction due to gram-negative bacteria ([place]) 01/31/2020   Transfusion history 1976   Type 2 diabetes mellitus with stage 3 chronic kidney disease, without long-term current use of insulin ([place]) 08/11/2008   Ophth, Dr Kathrin Penner: no retinopathy  Diabetes maternal  grandmother    Past Surgical History:  Procedure Laterality Date   ABDOMINAL HYSTERECTOMY  1976   with BSO due to infection from [place] IUD   [place]   @ TAH & BSO   BREAST LUMPECTOMY     [id number], [phone]   CATARACT EXTRACTION, BILATERAL Bilateral 2018   COLONOSCOPY W/ POLYPECTOMY  2006   negative 2011; Dr Carlean Purl   MASTECTOMY W/ NODES PARTIAL  2012   double mastectomy with nodes taken out on right side    PLACEMENT OF BREAST IMPLANTS  04/2011   Dr Migdalia Dk, [place]      There were no vitals filed for this visit.   Subjective Assessment - 04/04/21 1024     Subjective No pain, no falls; didn't do anything over the weekend.  Didn't do the exercises; I just stayed sitting and read 2 books over the weekend.  Otherwise, I do the exercises once a day.    Patient is accompained by: Family member   husband   Pertinent History PMH:  DM, HTN, Gilbert's syndrome, IBS, closed fracture pelvis, breast cancer; UTI, hx of sepsis    Patient Stated Goals Pt's goal for therapy is to stand up and walk around without the walker.  Don't want to fall again.  Also want to work on strength (husband) for getting out of chair.    Currently in Pain? No/denies                               [facility] Adult PT Treatment/Exercise - 04/04/21 0001       Ambulation/Gait   Ambulation/Gait Yes    Ambulation/Gait Assistance 5: Supervision    Ambulation Distance (Feet) 20 Feet   x 6 reps, simulating 3 laps in hallway at home.  100 ft x 2; 40 ft x 2   Assistive device Rollator    Gait Pattern Step-through pattern;Decreased step length - right;Decreased trunk rotation;Decreased hip/knee flexion - left;Shuffle;Festinating    Ambulation Surface Level;Indoor    Pre-Gait Activities Practiced figure-8 turns x 3 reps, cues for increased R foot clearance, staying close to rollator during turns.    Gait Comments Discussed walking at home, 2-3  times a day, along hallway at home, with rollator.  Will discuss further with pt and husband next visit.      Therapeutic Activites    Therapeutic Activities Other Therapeutic Activities    Other Therapeutic Activities Assessed vitals, c/o dizziness after several minutes of standing.  See measures in note section.           After gait, assessed O2 sats 98%, HR 77 bpm  Neuro Re-education  Reviewed HEP from last visit:  sidestep and weightshift x 10 each leg, then back step and weigthshift x 10 reps(consecutive legs); performed 2nd set in clinic session with alternating legs.  Pt c/o dizziness/lightheadedness after standing exercises.  HR 77 bpm, O2 sats 98, BP:  110/67; standing after 1 minute:  107/55   Heel/toe raises 2 sets x 10 reps.  Minisquats x 10 reps, then minisquats to up on toes, x 10 reps.  Marching in place x 10 reps with BUE support        PT Education - 04/04/21 1028     Education provided Yes    Education Details Reviewed again standing exercises added to HEP last visit; encouraged pt to perform.    Person(s) Educated Patient    Methods Explanation;Demonstration;Verbal cues    Comprehension Verbalized understanding;Returned demonstration              PT Short Term Goals - 03/31/21 0938       PT SHORT TERM GOAL #1   Title Pt will perform HEP with family supervision for improved strength, balance, transfers, and gait.  TARGET for all STGs 04/01/2021    Time 4    Period Weeks    Status Achieved      PT SHORT TERM GOAL #2   Title Pt will improve 5x sit<>stand to less than or equal to 15 sec to demonstrate improved functional strength and transfer efficiency.    Baseline 20.69 with UE support; 22 secs with better form/technique    Time 4    Period Weeks    Status Not Met      PT SHORT TERM GOAL #3   Title Pt will improve TUG score to less than or equal to 18 seconds for decreased fall risk.    Baseline 23.44 sec; 21.65 sec avg    Time 4    Period  Weeks    Status Not Met      PT SHORT TERM GOAL #4   Title Berg Balance score to be assessed, wtih goal to be written as approrpiate.    Baseline 03/28/21:  34/56 initial    Time 4    Period Weeks    Status Achieved      PT SHORT TERM GOAL #5   Title Pt/husband will verbalize understanding of fall prevention in home environment.    Time 4    Period Weeks    Status Achieved               PT Long Term Goals - 03/28/21 5093       PT LONG TERM GOAL #1   Title Pt will perform HEP with supervision of family to address balance, gait, functional mobility.  TARGET all LTGs 04/29/2021    Time 8    Period Weeks    Status New      PT LONG TERM GOAL #2   Title Pt will improve 5x sit<>stand to less than or equal to 12.5 sec to demonstrate improved functional strength and transfer efficiency.    Time 8    Period Weeks    Status New      PT LONG TERM GOAL #3   Title Pt will improve TUG score to less than or equal to 15 seconds for decreased fall risk.    Time 8    Period Weeks    Status New      PT LONG TERM GOAL #4   Title Berg score to imporve by 5 points from baseline for decreased fall rsik.    Baseline 34/56 initial    Time 8    Period Weeks    Status New      PT LONG TERM GOAL #5  Title Pt will improve gait velocity to at least 2.62 ft/sec for improved gait efficiency and safety.    Baseline 2.07 ft/sec    Time 8    Period Weeks    Status New                   Plan - 04/04/21 1029     Clinical Impression Statement Continued to work on standing balance activities today, including review of HEP additions.  Pt reports not doing anything over the weekend, and pt does seem to have flatter affect, more fatigue than usual today.  Vitals WNL; discussed with husband at the end of session to continue to monitor how patient feels and contact MD if needed.    Personal Factors and Comorbidities Comorbidity 3+    Comorbidities PMH:  DM, HTN, Gilbert's syndrome, IBS,  closed fracture pelvis, breast cancer, hx of sepsis    Examination-Activity Limitations Locomotion Level;Transfers;Stand;Stairs    Examination-Participation Restrictions Community Activity;Meal Prep    Stability/Clinical Decision Making Evolving/Moderate complexity    Rehab Potential Good    PT Frequency 2x / week    PT Duration 8 weeks    PT Treatment/Interventions ADLs/Self Care Home Management;Gait training;Stair training;Functional mobility training;Therapeutic activities;Therapeutic exercise;Balance training;Patient/family education;Neuromuscular re-education    PT Next Visit Plan 10th Visit PN next visit; discuss with pt/husband walking program at home.  Continue to work on hip/ankle/step strategy for balance-work on more variable perturvations, turns/figure 8 with gait.  Work towards [employer].    Consulted and Agree with Plan of Care Patient;Family member/caregiver    Family Member Consulted husband ( at end of session)             Patient will benefit from skilled therapeutic intervention in order to improve the following deficits and impairments:  Abnormal gait, Difficulty walking, Decreased balance, Decreased mobility, Decreased strength, Postural dysfunction  Visit Diagnosis: Unsteadiness on feet     Problem List Patient Active Problem List   Diagnosis Date Noted   Head injury, acute, initial encounter 02/12/2021   UTI (urinary tract infection) 01/17/2021   Aortic atherosclerosis ([place]) 08/27/2020   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   Parkinson disease ([place]) 08/26/2020   Paroxysmal atrial fibrillation ([place]) 43/32/9518   Complicated UTI (urinary tract infection) 01/31/2020   CKD (chronic kidney disease) stage 3, GFR 30-59 ml/min ([facility]) 01/31/2020   Hypokalemia due to inadequate potassium intake 01/31/2020   Severe sepsis with acute organ dysfunction due to Gram negative bacteria ([place]) 01/31/2020   Episodic lightheadedness 08/27/2019   Dizziness 02/24/2019   Peripheral  neuropathy 08/23/2018   Poor balance 02/20/2018   Genetic testing 09/04/2017   Family history of breast cancer    Family history of ovarian cancer    Fatty liver 08/05/2015   History of breast cancer 08/04/2015   S/P bilateral mastectomy 08/04/2015   Osteoarthritis of left knee 08/04/2015   Elevated LFTs 08/10/2014   Osteopenia 08/31/2013   Transfusion history 08/18/2013   IBS (irritable bowel syndrome) 08/18/2013   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 04/04/2021, 10:33 AM Frazier Butt., PT   [facility] [address] [place], [place], [address] Phone:  [phone]   Fax:  [phone]  Name: FARZANA KOCI MRN: [id number] Date of Birth: Jun 10, 1941

## 2021-04-08 ENCOUNTER — Encounter: Payer: Self-pay | Admitting: Physical Therapy

## 2021-04-08 ENCOUNTER — Ambulatory Visit: Payer: Medicare Other | Admitting: Physical Therapy

## 2021-04-08 ENCOUNTER — Other Ambulatory Visit: Payer: Self-pay

## 2021-04-08 DIAGNOSIS — M6281 Muscle weakness (generalized): Secondary | ICD-10-CM

## 2021-04-08 DIAGNOSIS — R2689 Other abnormalities of gait and mobility: Secondary | ICD-10-CM

## 2021-04-08 DIAGNOSIS — R2681 Unsteadiness on feet: Secondary | ICD-10-CM

## 2021-04-08 NOTE — Therapy (Signed)
[facility] [address] [place], [place], [address] Phone: [phone]   Fax:  [phone]  Physical Therapy Treatment/10th Visit Progress Note  Patient Details  Name: [name] MRN: [id number] Date of Birth: [birth year] Referring Provider (PT): Metta Clines, DO  See clinical impression for 10th visit progress note information  Encounter Date: 04/08/2021   PT End of Session - 04/08/21 0807     Visit Number 10    Number of Visits 17    Date for PT Re-Evaluation 04/29/21    Authorization Type U[employer] Medicare-Pt has been seen by [facility] this year; will likely need KX    Progress Note Due on Visit 10    PT Start Time 0804    PT Stop Time 0845    PT Time Calculation (min) 41 min    Activity Tolerance Patient tolerated treatment well    Behavior During Therapy [facility] for tasks assessed/performed             Past Medical History:  Diagnosis Date   Acute renal failure superimposed on stage 3 chronic kidney disease ([place]) 01/31/2020   Breast cancer ([place]) 2012   Cancer of central portion of right female breast ([place]) 09/04/2010   Patient has a long history of fibrocystic disease. 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 She underwent right total mastectomy with sentinel node biopsy and left total (prophylactic) mastectomy on 10/12/2010. She underwent immediate reconstruction with expander and AlloDerm placement. 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 One lymph   Complicated UTI (urinary tract infection) 01/31/2020   Diabetes mellitus    Dizziness 02/24/2019   Elevated LFTs 08/10/2014   Episodic lightheadedness 08/27/2019   Esophageal reflux 05/06/2008   Essential hypertension 02/16/2009   Family history of breast cancer    Family history of ovarian cancer    Fatigue 02/24/2019   Fatty liver 08/05/2015   Genetic testing 09/04/2017   Negative genetic testing  on the common hereditary cancer panel.  The Hereditary Gene Panel offered by [facility] includes sequencing and/or deletion duplication testing of the following 47 genes: APC, ATM, AXIN2, BARD1, BMPR1A, BRCA1, BRCA2, BRIP1, CDH1, CDK4, CDKN2A (p14ARF), CDKN2A (p16INK4a), CHEK2, CTNNA1, DICER1, EPCAM (Deletion/duplication testing only), GREM1 (promoter region deletion/duplicat   GERD (gastroesophageal reflux disease)    [name] syndrome    GILBERT'S SYNDROME 02/06/2007   Qualifier: Diagnosis of  By: Janelle Floor     History of breast cancer 08/04/2015   S/p b/l mastectomy   History of colonic polyps 06/06/2010   Annotation: destroyed, no clear adenomatous proven Qualifier: Diagnosis of  By: Carlean Purl MD, Tonna Boehringer E  2008 polyp 2013 neg     Hypertension    Hypertriglyceridemia 09/30/2007   statin intolerant  Father MI @ 71 Sister CVA > 65    Hypokalemia due to inadequate potassium intake 01/31/2020   IBS (irritable bowel syndrome)    Joint pain 09/02/2010   Lactic acidosis 01/31/2020   Osteoarthritis of left knee 08/04/2015   Significant arthritis in knees, limits her activity    Osteopenia 08/31/2013   [facility]  DEXA 03/20/2018: Osteopenia:  LFN -1.3, R--1.0, spine -0.6-statistically significant increase in BMD in all areas  Findings 08/22/13 : lowest T score -  1.3  @  R femoral neck (hip) ,3.4  %loss @ R femur &   4.1 % in spine   since  2012 Dexa 01/05/16: lowest  - L femur neck T -  1.6 Diagnosis: mild Osteopenia Rx: Fosamax remotely; now on Arimidex    Parkinsonian features 01/31/2020   Peripheral neuropathy 08/23/2018   Plantar fasciitis of right foot    Dr Oneta Rack   Poor balance 02/20/2018   S/P bilateral mastectomy 08/04/2015   Septic shock ([place]) 01/31/2020   Severe sepsis with acute organ dysfunction due to gram-negative bacteria ([place]) 01/31/2020   Transfusion history 1976   Type 2 diabetes mellitus with stage 3 chronic kidney disease, without long-term current use of insulin ([place]) 08/11/2008    Ophth, Dr Kathrin Penner: no retinopathy  Diabetes maternal grandmother    Past Surgical History:  Procedure Laterality Date   ABDOMINAL HYSTERECTOMY  1976   with BSO due to infection from [place] IUD   [place]   @ TAH & BSO   BREAST LUMPECTOMY     [id number], [phone]   CATARACT EXTRACTION, BILATERAL Bilateral 2018   COLONOSCOPY W/ POLYPECTOMY  2006   negative 2011; Dr Carlean Purl   MASTECTOMY W/ NODES PARTIAL  2012   double mastectomy with nodes taken out on right side    PLACEMENT OF BREAST IMPLANTS  04/2011   Dr Migdalia Dk, [place]      There were no vitals filed for this visit.   Subjective Assessment - 04/08/21 0806     Subjective Had one fall, trying to get my husband to come to the dog.  Tripped my foot over the leg of the walker in a rush.  Didn't hurt anything, but husband had to help me up.    Patient is accompained by: Family member   husband   Pertinent History PMH:  DM, HTN, Gilbert's syndrome, IBS, closed fracture pelvis, breast cancer; UTI, hx of sepsis    Patient Stated Goals Pt's goal for therapy is to stand up and walk around without the walker.  Don't want to fall again.  Also want to work on strength (husband) for getting out of chair.    Currently in Pain? No/denies                               [facility] Adult PT Treatment/Exercise - 04/08/21 0001       Ambulation/Gait   Ambulation/Gait Yes    Ambulation/Gait Assistance 5: Supervision    Ambulation/Gait Assistance Details Provided education to patient and husband on walking program for home    Ambulation Distance (Feet) 80 Feet   x 2, 20 ft x 4 reps   Assistive device Rollator    Gait Pattern Step-through pattern;Decreased step length - right;Decreased trunk rotation;Decreased hip/knee flexion - left;Shuffle;Festinating    Ambulation Surface Level;Indoor    Gait Comments Cues for R foot clearance      Knee/Hip Exercises: Aerobic    Nustep NuStep, Level 2, 4 extremities x 8 minutes, for strength, flexibility through BLEs.  Cues for increasing steps/min to >40-50.  Pt able to maintain this speed throughout.             Reviewed most recent addition to HEP:   Sidestep and weightshift x 10 each leg, then back step and weigthshift x 10 reps at counter, UE support.  Pt return demo understanding   Balance Exercises - 04/08/21 0001       Balance Exercises: Standing   SLS with Vectors Solid surface;Upper extremity assist 2;Other reps (comment);Limitations    SLS with Vectors Limitations Alt step taps to 4" cabinet shelf,  10 reps, 2 sets    Stepping Strategy Anterior;Lateral;UE support;10 reps;Limitations    Stepping Strategy Limitations Stepping over flat black obstacle, 2 sets x 10 reps.  Cues to look ahead, not down at feet.    Marching Solid surface;Upper extremity assist 2;Static;10 reps    Marching Limitations Tactile cues for increased RLE step height, BUE support    Other Standing Exercises forward kicks to target, 10 reps 2 sets each leg, for focus on RLE intensity of movement.               PT Education - 04/08/21 0839     Education provided Yes    Education Details Walking program added to [employer]) Educated Patient;Spouse    Methods Explanation;Verbal cues;Handout;Demonstration    Comprehension Verbalized understanding;Returned demonstration              PT Short Term Goals - 03/31/21 0938       PT SHORT TERM GOAL #1   Title Pt will perform HEP with family supervision for improved strength, balance, transfers, and gait.  TARGET for all STGs 04/01/2021    Time 4    Period Weeks    Status Achieved      PT SHORT TERM GOAL #2   Title Pt will improve 5x sit<>stand to less than or equal to 15 sec to demonstrate improved functional strength and transfer efficiency.    Baseline 20.69 with UE support; 22 secs with better form/technique    Time 4    Period Weeks    Status Not Met       PT SHORT TERM GOAL #3   Title Pt will improve TUG score to less than or equal to 18 seconds for decreased fall risk.    Baseline 23.44 sec; 21.65 sec avg    Time 4    Period Weeks    Status Not Met      PT SHORT TERM GOAL #4   Title Berg Balance score to be assessed, wtih goal to be written as approrpiate.    Baseline 03/28/21:  34/56 initial    Time 4    Period Weeks    Status Achieved      PT SHORT TERM GOAL #5   Title Pt/husband will verbalize understanding of fall prevention in home environment.    Time 4    Period Weeks    Status Achieved               PT Long Term Goals - 03/28/21 9233       PT LONG TERM GOAL #1   Title Pt will perform HEP with supervision of family to address balance, gait, functional mobility.  TARGET all LTGs 04/29/2021    Time 8    Period Weeks    Status New      PT LONG TERM GOAL #2   Title Pt will improve 5x sit<>stand to less than or equal to 12.5 sec to demonstrate improved functional strength and transfer efficiency.    Time 8    Period Weeks    Status New      PT LONG TERM GOAL #3   Title Pt will improve TUG score to less than or equal to 15 seconds for decreased fall risk.    Time 8    Period Weeks    Status New      PT LONG TERM GOAL #4   Title Berg score to imporve by 5 points from baseline for  decreased fall rsik.    Baseline 34/56 initial    Time 8    Period Weeks    Status New      PT LONG TERM GOAL #5   Title Pt will improve gait velocity to at least 2.62 ft/sec for improved gait efficiency and safety.    Baseline 2.07 ft/sec    Time 8    Period Weeks    Status New                   Plan - 04/08/21 0814     Clinical Impression Statement 10th Visit progress note:  covering dates 03/04/21-04/08/21.  Objective measures taken last week, with 5x sit<>stand 22 sec, TUG score slightly improved to 22.03 seconds.  Pt and husband report pt is moving better around the home; however, she has had several falls and  remains at fall risk.  Focus of skilled PT session today was increased amplitude and intensity with RLE to improve foot clearance with gait and mobility.  Pt has had one fall and PT reviewed safety and attention to gait for improved R foot clearance and gait safety.  Pt has met 3 of 5 STGs and is progressing twoards LTGs.  She will continue to benefit from skilled PT to address fall risk and improved balance, functional strength and safety with gait/transfers.  Plan to work towards [place].    Personal Factors and Comorbidities Comorbidity 3+    Comorbidities PMH:  DM, HTN, Gilbert's syndrome, IBS, closed fracture pelvis, breast cancer, hx of sepsis    Examination-Activity Limitations Locomotion Level;Transfers;Stand;Stairs    Examination-Participation Restrictions Community Activity;Meal Prep    Stability/Clinical Decision Making Evolving/Moderate complexity    Rehab Potential Good    PT Frequency 2x / week    PT Duration 8 weeks    PT Treatment/Interventions ADLs/Self Care Home Management;Gait training;Stair training;Functional mobility training;Therapeutic activities;Therapeutic exercise;Balance training;Patient/family education;Neuromuscular re-education    PT Next Visit Plan [place] with pt/husband walking program at home.  Continue to work on hip/ankle/step strategy for balance-work on more variable perturvations, turns/figure 8 with gait.  Foot clearance for RLE with gait.  Work towards [employer].    Consulted and Agree with Plan of Care Patient;Family member/caregiver    Family Member Consulted husband ( at end of session)             Patient will benefit from skilled therapeutic intervention in order to improve the following deficits and impairments:  Abnormal gait, Difficulty walking, Decreased balance, Decreased mobility, Decreased strength, Postural dysfunction  Visit Diagnosis: Unsteadiness on feet  Other abnormalities of gait and mobility  Muscle weakness  (generalized)     Problem List Patient Active Problem List   Diagnosis Date Noted   Head injury, acute, initial encounter 02/12/2021   UTI (urinary tract infection) 01/17/2021   Aortic atherosclerosis ([place]) 08/27/2020   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   Parkinson disease ([place]) 08/26/2020   Paroxysmal atrial fibrillation ([place]) 26/94/8546   Complicated UTI (urinary tract infection) 01/31/2020   CKD (chronic kidney disease) stage 3, GFR 30-59 ml/min ([place]) 01/31/2020   Hypokalemia due to inadequate potassium intake 01/31/2020   Severe sepsis with acute organ dysfunction due to Gram negative bacteria ([place]) 01/31/2020   Episodic lightheadedness 08/27/2019   Dizziness 02/24/2019   Peripheral neuropathy 08/23/2018   Poor balance 02/20/2018   Genetic testing 09/04/2017   Family history of breast cancer    Family history of ovarian cancer    Fatty liver 08/05/2015  History of breast cancer 08/04/2015   S/P bilateral mastectomy 08/04/2015   Osteoarthritis of left knee 08/04/2015   Elevated LFTs 08/10/2014   Osteopenia 08/31/2013   Transfusion history 08/18/2013   IBS (irritable bowel syndrome) 08/18/2013   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 04/08/2021, 9:01 AM Frazier Butt., PT   [facility] [address] [place], [place], [address] Phone: [phone]   Fax:  [phone]  Name: [name] MRN: [id number] Date of Birth: 08/28/1941

## 2021-04-08 NOTE — Patient Instructions (Signed)
WALKING  Walking is a great form of exercise to increase your strength, endurance and overall fitness.  A walking program can help you start slowly and gradually build endurance as you go.  Everyone's ability is different, so each person's starting point will be different.  You do not have to follow them exactly.  The are just samples. You should simply find out what's right for you and stick to that program.   In the beginning, you'll start off walking 2-3 times a day for short distances.  As you get stronger, you'll be walking further at just 1-2 times per day.  A. You Can Walk For A Certain Length Of Time Each Day    Walk 2 minutes 3 times per day.  Increase 1 minute every 5-7 days (3 times per day).  Work up to 5-8 minutes (1-2 times per day).   Example:   Day 1-2 2 minutes 3 times per day   Day 7-8 3-4 minutes 2-3 times per day   Day 13-14 5-6 minutes 1-2 times per day  B. You Can Walk For a Certain Distance Each Day     Distance can be substituted for time.    Example:   2 laps of the hallway in your house  (be mindful for slow, controlled turns to change directions)

## 2021-04-11 ENCOUNTER — Ambulatory Visit: Payer: Medicare Other | Admitting: Physical Therapy

## 2021-04-11 ENCOUNTER — Other Ambulatory Visit: Payer: Self-pay

## 2021-04-11 DIAGNOSIS — R2681 Unsteadiness on feet: Secondary | ICD-10-CM | POA: Diagnosis not present

## 2021-04-11 DIAGNOSIS — R2689 Other abnormalities of gait and mobility: Secondary | ICD-10-CM

## 2021-04-11 DIAGNOSIS — M6281 Muscle weakness (generalized): Secondary | ICD-10-CM | POA: Diagnosis not present

## 2021-04-11 NOTE — Therapy (Signed)
[facility] [address] [place], [place], [address] Phone: [phone]   Fax:  [phone]  Physical Therapy Treatment  Patient Details  Name: [name] MRN: [id number] Date of Birth: 1941/06/01 Referring Provider (PT): Shon Millet, DO   Encounter Date: 04/11/2021   PT End of Session - 04/11/21 1018     Visit Number 11    Number of Visits 17    Date for PT Re-Evaluation 04/29/21    Authorization Type [employer] Medicare-Pt has been seen by [facility] this year; will likely need KX    Progress Note Due on Visit 10    PT Start Time 1019    PT Stop Time 1100    PT Time Calculation (min) 41 min    Activity Tolerance Patient tolerated treatment well    Behavior During Therapy [facility] for tasks assessed/performed             Past Medical History:  Diagnosis Date   Acute renal failure superimposed on stage 3 chronic kidney disease (HCC) 01/31/2020   Breast cancer (HCC) 2012   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 She underwent right total mastectomy with sentinel node biopsy and left total (prophylactic) mastectomy on 10/12/2010. She underwent immediate reconstruction with expander and AlloDerm placement. 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 One lymph   Complicated UTI (urinary tract infection) 01/31/2020   Diabetes mellitus    Dizziness 02/24/2019   Elevated LFTs 08/10/2014   Episodic lightheadedness 08/27/2019   Esophageal reflux 05/06/2008   Essential hypertension 02/16/2009   Family history of breast cancer    Family history of ovarian cancer    Fatigue 02/24/2019   Fatty liver 08/05/2015   Genetic testing 09/04/2017   Negative genetic testing on the common hereditary cancer panel.  The Hereditary Gene Panel offered by [facility]  includes sequencing and/or deletion duplication testing of the following 47 genes: APC, ATM, AXIN2, BARD1, BMPR1A, BRCA1, BRCA2, BRIP1, CDH1, CDK4, CDKN2A (p14ARF), CDKN2A (p16INK4a), CHEK2, CTNNA1, DICER1, EPCAM (Deletion/duplication testing only), GREM1 (promoter region deletion/duplicat   GERD (gastroesophageal reflux disease)    [place]one syndrome    GILBERT'S SYNDROME 02/06/2007   Qualifier: Diagnosis of  By: Wendall Stade     History of breast cancer 08/04/2015   S/p b/l mastectomy   History of colonic polyps 06/06/2010   Annotation: destroyed, no clear adenomatous proven Qualifier: Diagnosis of  By: Leone Payor MD, Alfonse Ras E  2008 polyp 2013 neg     Hypertension    Hypertriglyceridemia 09/30/2007   statin intolerant  Father MI @ 14 Sister CVA > 65    Hypokalemia due to inadequate potassium intake 01/31/2020   IBS (irritable bowel syndrome)    Joint pain 09/02/2010   Lactic acidosis 01/31/2020   Osteoarthritis of left knee 08/04/2015   Significant arthritis in knees, limits her activity    Osteopenia 08/31/2013   [facility]  DEXA 03/20/2018: Osteopenia:  LFN -1.3, R--1.0, spine -0.6-statistically significant increase in BMD in all areas  Findings 08/22/13 : lowest T score -  1.3  @  R femoral neck (hip) ,3.4  %loss @ R femur &   4.1 % in spine   since  2012 Dexa 01/05/16: lowest  - L femur neck T -1.6 Diagnosis: mild Osteopenia Rx: Fosamax remotely; now on Arimidex  Parkinsonian features 01/31/2020   Peripheral neuropathy 08/23/2018   Plantar fasciitis of right foot    Dr Oneta Rack   Poor balance 02/20/2018   S/P bilateral mastectomy 08/04/2015   Septic shock ([place]) 01/31/2020   Severe sepsis with acute organ dysfunction due to gram-negative bacteria ([place]) 01/31/2020   Transfusion history 1976   Type 2 diabetes mellitus with stage 3 chronic kidney disease, without long-term current use of insulin ([place]) 08/11/2008   Ophth, Dr Kathrin Penner: no retinopathy  Diabetes maternal grandmother    Past Surgical  History:  Procedure Laterality Date   ABDOMINAL HYSTERECTOMY  1976   with BSO due to infection from [place] IUD   [place]   @ TAH & BSO   BREAST LUMPECTOMY     [id number], [phone]   CATARACT EXTRACTION, BILATERAL Bilateral 2018   COLONOSCOPY W/ POLYPECTOMY  2006   negative 2011; Dr Carlean Purl   MASTECTOMY W/ NODES PARTIAL  2012   double mastectomy with nodes taken out on right side    PLACEMENT OF BREAST IMPLANTS  04/2011   Dr Migdalia Dk, [place]      There were no vitals filed for this visit.   Subjective Assessment - 04/11/21 1018     Subjective Did alot of walking over the weekend.  No falls.    Patient is accompained by: Family member   husband   Pertinent History PMH:  DM, HTN, Gilbert's syndrome, IBS, closed fracture pelvis, breast cancer; UTI, hx of sepsis    Patient Stated Goals Pt's goal for therapy is to stand up and walk around without the walker.  Don't want to fall again.  Also want to work on strength (husband) for getting out of chair.    Currently in Pain? No/denies                               [facility] Adult PT Treatment/Exercise - 04/11/21 0001       Transfers   Transfers Sit to Stand;Stand to Sit    Sit to Stand 5: Supervision;With upper extremity assist;From chair/3-in-1;From bed    Stand to Sit To chair/3-in-1;5: Supervision;With upper extremity assist;To bed    Number of Reps 2 sets;Other reps (comment)      Ambulation/Gait   Ambulation/Gait Yes    Ambulation/Gait Assistance 5: Supervision    Ambulation Distance (Feet) 80 Feet   115 x 2   Assistive device Rollator    Gait Pattern Step-through pattern;Decreased step length - right;Decreased trunk rotation;Decreased hip/knee flexion - left;Shuffle;Festinating    Ambulation Surface Level;Indoor      High Level Balance   High Level Balance Activities Figure 8 turns;Marching turns    High Level Balance Comments Using rollator with  cues for technique, R foot clearance.      Knee/Hip Exercises: Aerobic   Nustep NuStep, Level 3, 4 extremities x 8 minutes, for strength, flexibility through BLEs.  Cues for increasing steps/min to >50.  Pt able to maintain this speed throughout.             Additional gait, 80 ft, 60 ft x 2 with rollator, cues to attend to R foot clearance.    Balance Exercises - 04/11/21 0001       Balance Exercises: Standing   Rockerboard Anterior/posterior;EO;UE support;Limitations    Rockerboard Limitations Hip/ankle strategy work, then standing on static board, alt UE lifts, then head turns/head nods.  Pt  with posterior lean, several times, needs therapist cues and UEs to reach out for balance recovery in more forward direction.  Standing on board, forward tipping, then forward step taps to ground and return to board, x 10 reps on each leg.    Retro Gait Upper extremity support;3 reps;Limitations    Retro Gait Limitations back along parallel bars, BUE support, cues for step length and foot clearance    Other Standing Exercises Toe walking in parallel bars, forward x 3 reps                 PT Short Term Goals - 03/31/21 9485       PT SHORT TERM GOAL #1   Title Pt will perform HEP with family supervision for improved strength, balance, transfers, and gait.  TARGET for all STGs 04/01/2021    Time 4    Period Weeks    Status Achieved      PT SHORT TERM GOAL #2   Title Pt will improve 5x sit<>stand to less than or equal to 15 sec to demonstrate improved functional strength and transfer efficiency.    Baseline 20.69 with UE support; 22 secs with better form/technique    Time 4    Period Weeks    Status Not Met      PT SHORT TERM GOAL #3   Title Pt will improve TUG score to less than or equal to 18 seconds for decreased fall risk.    Baseline 23.44 sec; 21.65 sec avg    Time 4    Period Weeks    Status Not Met      PT SHORT TERM GOAL #4   Title Berg Balance score to be assessed,  wtih goal to be written as approrpiate.    Baseline 03/28/21:  34/56 initial    Time 4    Period Weeks    Status Achieved      PT SHORT TERM GOAL #5   Title Pt/husband will verbalize understanding of fall prevention in home environment.    Time 4    Period Weeks    Status Achieved               PT Long Term Goals - 03/28/21 4627       PT LONG TERM GOAL #1   Title Pt will perform HEP with supervision of family to address balance, gait, functional mobility.  TARGET all LTGs 04/29/2021    Time 8    Period Weeks    Status New      PT LONG TERM GOAL #2   Title Pt will improve 5x sit<>stand to less than or equal to 12.5 sec to demonstrate improved functional strength and transfer efficiency.    Time 8    Period Weeks    Status New      PT LONG TERM GOAL #3   Title Pt will improve TUG score to less than or equal to 15 seconds for decreased fall risk.    Time 8    Period Weeks    Status New      PT LONG TERM GOAL #4   Title Berg score to imporve by 5 points from baseline for decreased fall rsik.    Baseline 34/56 initial    Time 8    Period Weeks    Status New      PT LONG TERM GOAL #5   Title Pt will improve gait velocity to at least 2.62 ft/sec for improved gait efficiency  and safety.    Baseline 2.07 ft/sec    Time 8    Period Weeks    Status New                   Plan - 04/11/21 1021     Clinical Impression Statement Pt with significant posterior LOB in parallel bars while using rockerboard; she is unable to recover balance using hip or ankle strategies, step strategy is not ellicited; she has to reach out for UE support and has therapist assist.  Pt will need additional practice and education in fall prevention and fall recovery, she is aware that BUE and use of rollator is best option for gait safety.    Personal Factors and Comorbidities Comorbidity 3+    Comorbidities PMH:  DM, HTN, Gilbert's syndrome, IBS, closed fracture pelvis, breast cancer, hx  of sepsis    Examination-Activity Limitations Locomotion Level;Transfers;Stand;Stairs    Examination-Participation Restrictions Community Activity;Meal Prep    Stability/Clinical Decision Making Evolving/Moderate complexity    Rehab Potential Good    PT Frequency 2x / week    PT Duration 8 weeks    PT Treatment/Interventions ADLs/Self Care Home Management;Gait training;Stair training;Functional mobility training;Therapeutic activities;Therapeutic exercise;Balance training;Patient/family education;Neuromuscular re-education    PT Next Visit Plan Educate with husband and patient foot positions and static standing positions to help avoid posterior LOB.  Continue to work on hip/ankle/step strategy for balance-work on more variable perturbations, turns/figure 8 with gait.  Foot clearance for RLE with gait.  Work towards [employer].    Consulted and Agree with Plan of Care Patient;Family member/caregiver    Family Member Consulted husband ( at end of session)             Patient will benefit from skilled therapeutic intervention in order to improve the following deficits and impairments:  Abnormal gait, Difficulty walking, Decreased balance, Decreased mobility, Decreased strength, Postural dysfunction  Visit Diagnosis: Unsteadiness on feet  Muscle weakness (generalized)  Other abnormalities of gait and mobility     Problem List Patient Active Problem List   Diagnosis Date Noted   Head injury, acute, initial encounter 02/12/2021   UTI (urinary tract infection) 01/17/2021   Aortic atherosclerosis ([place]) 08/27/2020   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   Parkinson disease ([place]) 08/26/2020   Paroxysmal atrial fibrillation ([place]) 50/27/7412   Complicated UTI (urinary tract infection) 01/31/2020   CKD (chronic kidney disease) stage 3, GFR 30-59 ml/min (HCC) 01/31/2020   Hypokalemia due to inadequate potassium intake 01/31/2020   Severe sepsis with acute organ dysfunction due to Gram negative  bacteria ([place]) 01/31/2020   Episodic lightheadedness 08/27/2019   Dizziness 02/24/2019   Peripheral neuropathy 08/23/2018   Poor balance 02/20/2018   Genetic testing 09/04/2017   Family history of breast cancer    Family history of ovarian cancer    Fatty liver 08/05/2015   History of breast cancer 08/04/2015   S/P bilateral mastectomy 08/04/2015   Osteoarthritis of left knee 08/04/2015   Elevated LFTs 08/10/2014   Osteopenia 08/31/2013   Transfusion history 08/18/2013   IBS (irritable bowel syndrome) 08/18/2013   Cancer of central portion of right female breast ([place]) 09/04/2010   Joint pain 09/02/2010   History of colonic polyps 06/06/2010   Essential hypertension 02/16/2009   Type 2 diabetes mellitus with stage 3 chronic kidney disease, without long-term current use of insulin ([place]) 08/11/2008   Hypertriglyceridemia 09/30/2007   GILBERT'S SYNDROME 02/06/2007    Terril Amaro W. 04/11/2021, 11:00 AM Frazier Butt., PT  [facility] [address] [place], [place], [address] Phone: [phone]   Fax:  [phone]  Name: [name] MRN: [id number] Date of Birth: 09-29-1940

## 2021-04-13 ENCOUNTER — Ambulatory Visit: Payer: Medicare Other | Admitting: Physical Therapy

## 2021-04-15 ENCOUNTER — Encounter: Payer: Self-pay | Admitting: Physical Therapy

## 2021-04-15 ENCOUNTER — Other Ambulatory Visit: Payer: Self-pay

## 2021-04-15 ENCOUNTER — Ambulatory Visit: Payer: Medicare Other | Admitting: Physical Therapy

## 2021-04-15 DIAGNOSIS — R2681 Unsteadiness on feet: Secondary | ICD-10-CM | POA: Diagnosis not present

## 2021-04-15 DIAGNOSIS — R2689 Other abnormalities of gait and mobility: Secondary | ICD-10-CM

## 2021-04-15 DIAGNOSIS — M6281 Muscle weakness (generalized): Secondary | ICD-10-CM

## 2021-04-15 NOTE — Therapy (Signed)
[facility] [address] [place], [place], [address] Phone: [phone]   Fax:  [phone]  Physical Therapy Treatment  Patient Details  Name: [name] MRN: [id number] Date of Birth: June 27, 1941 Referring Provider (PT): Metta Clines, DO   Encounter Date: 04/15/2021   PT End of Session - 04/15/21 1154     Visit Number 12    Number of Visits 17    Date for PT Re-Evaluation 04/29/21    Authorization Type [employer] Medicare-Pt has been seen by [facility] this year; will likely need KX    Progress Note Due on Visit 10    PT Start Time 0717    PT Stop Time 0758    PT Time Calculation (min) 41 min    Equipment Utilized During Treatment Gait belt    Activity Tolerance Patient tolerated treatment well    Behavior During Therapy [facility] for tasks assessed/performed             Past Medical History:  Diagnosis Date   Acute renal failure superimposed on stage 3 chronic kidney disease ([place]) 01/31/2020   Breast cancer ([place]) 2012   Cancer of central portion of right female breast ([place]) 09/04/2010   Patient has a long history of fibrocystic disease. 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 She underwent right total mastectomy with sentinel node biopsy and left total (prophylactic) mastectomy on 10/12/2010. She underwent immediate reconstruction with expander and AlloDerm placement. 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 One lymph   Complicated UTI (urinary tract infection) 01/31/2020   Diabetes mellitus    Dizziness 02/24/2019   Elevated LFTs 08/10/2014   Episodic lightheadedness 08/27/2019   Esophageal reflux 05/06/2008   Essential hypertension 02/16/2009   Family history of breast cancer    Family history of ovarian cancer    Fatigue 02/24/2019   Fatty liver 08/05/2015   Genetic testing 09/04/2017   Negative genetic testing on the common hereditary cancer panel.   The Hereditary Gene Panel offered by [facility] includes sequencing and/or deletion duplication testing of the following 47 genes: APC, ATM, AXIN2, BARD1, BMPR1A, BRCA1, BRCA2, BRIP1, CDH1, CDK4, CDKN2A (p14ARF), CDKN2A (p16INK4a), CHEK2, CTNNA1, DICER1, EPCAM (Deletion/duplication testing only), GREM1 (promoter region deletion/duplicat   GERD (gastroesophageal reflux disease)    [name] syndrome    GILBERT'S SYNDROME 02/06/2007   Qualifier: Diagnosis of  By: Janelle Floor     History of breast cancer 08/04/2015   S/p b/l mastectomy   History of colonic polyps 06/06/2010   Annotation: destroyed, no clear adenomatous proven Qualifier: Diagnosis of  By: Carlean Purl MD, Tonna Boehringer E  2008 polyp 2013 neg     Hypertension    Hypertriglyceridemia 09/30/2007   statin intolerant  Father MI @ 44 Sister CVA > 65    Hypokalemia due to inadequate potassium intake 01/31/2020   IBS (irritable bowel syndrome)    Joint pain 09/02/2010   Lactic acidosis 01/31/2020   Osteoarthritis of left knee 08/04/2015   Significant arthritis in knees, limits her activity    Osteopenia 08/31/2013   [facility]  DEXA 03/20/2018: Osteopenia:  LFN -1.3, R--1.0, spine -0.6-statistically significant increase in BMD in all areas  Findings 08/22/13 : lowest T score -  1.3  @  R femoral neck (hip) ,3.4  %loss @ R femur &   4.1 % in spine   since  2012 Dexa 01/05/16: lowest  - L femur neck T -1.6 Diagnosis: mild  Osteopenia Rx: Fosamax remotely; now on Arimidex    Parkinsonian features 01/31/2020   Peripheral neuropathy 08/23/2018   Plantar fasciitis of right foot    Dr Oneta Rack   Poor balance 02/20/2018   S/P bilateral mastectomy 08/04/2015   Septic shock ([place]) 01/31/2020   Severe sepsis with acute organ dysfunction due to gram-negative bacteria ([place]) 01/31/2020   Transfusion history 1976   Type 2 diabetes mellitus with stage 3 chronic kidney disease, without long-term current use of insulin ([place]) 08/11/2008   Ophth, Dr Kathrin Penner: no retinopathy   Diabetes maternal grandmother    Past Surgical History:  Procedure Laterality Date   ABDOMINAL HYSTERECTOMY  1976   with BSO due to infection from [place] IUD   [place]   @ TAH & BSO   BREAST LUMPECTOMY     [id number], [phone]   CATARACT EXTRACTION, BILATERAL Bilateral 2018   COLONOSCOPY W/ POLYPECTOMY  2006   negative 2011; Dr Carlean Purl   MASTECTOMY W/ NODES PARTIAL  2012   double mastectomy with nodes taken out on right side    PLACEMENT OF BREAST IMPLANTS  04/2011   Dr Migdalia Dk, [place]      There were no vitals filed for this visit.   Subjective Assessment - 04/15/21 0720     Subjective No changes, no falls. Has been working on the walking and her exercises.    Patient is accompained by: Family member   husband   Pertinent History PMH:  DM, HTN, Gilbert's syndrome, IBS, closed fracture pelvis, breast cancer; UTI, hx of sepsis    Patient Stated Goals Pt's goal for therapy is to stand up and walk around without the walker.  Don't want to fall again.  Also want to work on strength (husband) for getting out of chair.    Currently in Pain? No/denies                               [facility] Adult PT Treatment/Exercise - 04/15/21 0725       Ambulation/Gait   Ambulation/Gait Yes    Ambulation/Gait Assistance 5: Supervision    Ambulation/Gait Assistance Details cues to focus on R foot clearance during gait    Ambulation Distance (Feet) 80 Feet   x1, 115 x 1   Assistive device Rollator    Gait Pattern Step-through pattern;Decreased step length - right;Decreased trunk rotation;Decreased hip/knee flexion - left;Shuffle;Festinating    Ambulation Surface Level;Indoor      Therapeutic Activites    Therapeutic Activities Other Therapeutic Activities    Other Therapeutic Activities discussed with pt and demonstrated foot positions to help with balance during static standing - having a wide BOS or having a wide  staggered stance BOS, with pt able to demonstrate and verbalize understanding and get into position using BUE support. pt reporting has been focusing more on making sure she has a wider stance. pt's husband not back during session today so unable to show/educate him. also importance of making sure rollator is locked during any static standing and with sit <> stand transfers as pt has tendency to leave it unlocked      Knee/Hip Exercises: Aerobic   Nustep NuStep, Level 3, 4 extremities x6 minutes, for strength, flexibility through BLEs.  Cues for increasing steps/min to >50.  Pt able to maintain this speed throughout.  Balance Exercises - 04/15/21 0744       Balance Exercises: Standing   Standing Eyes Opened Wide (BOA);Foam/compliant surface;Limitations    Standing Eyes Opened Limitations on blue air ex with wide BOS: 2x30 second static holds, alternating x5 reps B UE lifts, 2 x 5 reps head turns -pt with a couple instances of grabbing onto bars for balance    Stepping Strategy Anterior;Lateral;UE support;10 reps;Limitations    Stepping Strategy Limitations with BUE support - cues for looking aheas and for posture; alternating stepping over 2" black obstacle then 4" black obstacle for improved foot clearance/weight shift, approx. 10 reps each    Retro Gait Upper extremity support;3 reps;Limitations    Retro Gait Limitations back along parallel bars, BUE support, cues for step length and foot clearance and widened BOS    Heel Raises Both;10 reps   cues to push straight up vs. forwards   Toe Raise Both;10 reps               PT Education - 04/15/21 1154     Education Details see TA    Person(s) Educated Patient    Methods Explanation;Demonstration    Comprehension Verbalized understanding;Returned demonstration              PT Short Term Goals - 03/31/21 0938       PT SHORT TERM GOAL #1   Title Pt will perform HEP with family supervision for improved  strength, balance, transfers, and gait.  TARGET for all STGs 04/01/2021    Time 4    Period Weeks    Status Achieved      PT SHORT TERM GOAL #2   Title Pt will improve 5x sit<>stand to less than or equal to 15 sec to demonstrate improved functional strength and transfer efficiency.    Baseline 20.69 with UE support; 22 secs with better form/technique    Time 4    Period Weeks    Status Not Met      PT SHORT TERM GOAL #3   Title Pt will improve TUG score to less than or equal to 18 seconds for decreased fall risk.    Baseline 23.44 sec; 21.65 sec avg    Time 4    Period Weeks    Status Not Met      PT SHORT TERM GOAL #4   Title Berg Balance score to be assessed, wtih goal to be written as approrpiate.    Baseline 03/28/21:  34/56 initial    Time 4    Period Weeks    Status Achieved      PT SHORT TERM GOAL #5   Title Pt/husband will verbalize understanding of fall prevention in home environment.    Time 4    Period Weeks    Status Achieved               PT Long Term Goals - 03/28/21 4436       PT LONG TERM GOAL #1   Title Pt will perform HEP with supervision of family to address balance, gait, functional mobility.  TARGET all LTGs 04/29/2021    Time 8    Period Weeks    Status New      PT LONG TERM GOAL #2   Title Pt will improve 5x sit<>stand to less than or equal to 12.5 sec to demonstrate improved functional strength and transfer efficiency.    Time 8    Period Weeks    Status New  PT LONG TERM GOAL #3   Title Pt will improve TUG score to less than or equal to 15 seconds for decreased fall risk.    Time 8    Period Weeks    Status New      PT LONG TERM GOAL #4   Title Berg score to imporve by 5 points from baseline for decreased fall rsik.    Baseline 34/56 initial    Time 8    Period Weeks    Status New      PT LONG TERM GOAL #5   Title Pt will improve gait velocity to at least 2.62 ft/sec for improved gait efficiency and safety.    Baseline  2.07 ft/sec    Time 8    Period Weeks    Status New                   Plan - 04/15/21 1158     Clinical Impression Statement Went over with pt static standing positions with wide BOS to help assist with balance, pt able to demo understanding. Will need to review next time as pt's husband not present during session. Performed balance on blue air ex today, with pt able to utilize ankle strategy to help maintain her balance with static balance, but when adding in head turns/UE lifts pt needing to reach out to // bars a couple times due to losing balance posteriorly. Will continue to progress towards LTGs.    Personal Factors and Comorbidities Comorbidity 3+    Comorbidities PMH:  DM, HTN, Gilbert's syndrome, IBS, closed fracture pelvis, breast cancer, hx of sepsis    Examination-Activity Limitations Locomotion Level;Transfers;Stand;Stairs    Examination-Participation Restrictions Community Activity;Meal Prep    Stability/Clinical Decision Making Evolving/Moderate complexity    Rehab Potential Good    PT Frequency 2x / week    PT Duration 8 weeks    PT Treatment/Interventions ADLs/Self Care Home Management;Gait training;Stair training;Functional mobility training;Therapeutic activities;Therapeutic exercise;Balance training;Patient/family education;Neuromuscular re-education    PT Next Visit Plan review with pt/husband foot positions and static standing positions to help avoid posterior LOB.  Continue to work on hip/ankle/step strategy for balance-work on more variable perturbations, turns/figure 8 with gait.  Foot clearance for RLE with gait.  Work towards [employer].    Consulted and Agree with Plan of Care Patient;Family member/caregiver             Patient will benefit from skilled therapeutic intervention in order to improve the following deficits and impairments:  Abnormal gait, Difficulty walking, Decreased balance, Decreased mobility, Decreased strength, Postural  dysfunction  Visit Diagnosis: Unsteadiness on feet  Muscle weakness (generalized)  Other abnormalities of gait and mobility     Problem List Patient Active Problem List   Diagnosis Date Noted   Head injury, acute, initial encounter 02/12/2021   UTI (urinary tract infection) 01/17/2021   Aortic atherosclerosis ([place]) 08/27/2020   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   Parkinson disease ([place]) 08/26/2020   Paroxysmal atrial fibrillation ([place]) 70/09/7492   Complicated UTI (urinary tract infection) 01/31/2020   CKD (chronic kidney disease) stage 3, GFR 30-59 ml/min ([place]) 01/31/2020   Hypokalemia due to inadequate potassium intake 01/31/2020   Severe sepsis with acute organ dysfunction due to Gram negative bacteria ([place]) 01/31/2020   Episodic lightheadedness 08/27/2019   Dizziness 02/24/2019   Peripheral neuropathy 08/23/2018   Poor balance 02/20/2018   Genetic testing 09/04/2017   Family history of breast cancer    Family history of ovarian cancer  Fatty liver 08/05/2015   History of breast cancer 08/04/2015   S/P bilateral mastectomy 08/04/2015   Osteoarthritis of left knee 08/04/2015   Elevated LFTs 08/10/2014   Osteopenia 08/31/2013   Transfusion history 08/18/2013   IBS (irritable bowel syndrome) 08/18/2013   Cancer of central portion of right female breast ([place]) 09/04/2010   Joint pain 09/02/2010   History of colonic polyps 06/06/2010   Essential hypertension 02/16/2009   Type 2 diabetes mellitus with stage 3 chronic kidney disease, without long-term current use of insulin ([place]) 08/11/2008   Hypertriglyceridemia 09/30/2007   GILBERT'S SYNDROME 02/06/2007    Arliss Journey, PT, DPT  04/15/2021, 12:01 PM  [facility] [address] [place], [place], [address] Phone: [phone]   Fax:  [phone]  Name: [name] MRN: [id number] Date of Birth: 1941/05/12

## 2021-04-18 ENCOUNTER — Other Ambulatory Visit: Payer: Self-pay

## 2021-04-18 ENCOUNTER — Ambulatory Visit: Payer: Medicare Other | Attending: Neurology | Admitting: Physical Therapy

## 2021-04-18 ENCOUNTER — Encounter: Payer: Self-pay | Admitting: Physical Therapy

## 2021-04-18 DIAGNOSIS — R2681 Unsteadiness on feet: Secondary | ICD-10-CM | POA: Insufficient documentation

## 2021-04-18 DIAGNOSIS — R29818 Other symptoms and signs involving the nervous system: Secondary | ICD-10-CM | POA: Insufficient documentation

## 2021-04-18 DIAGNOSIS — M6281 Muscle weakness (generalized): Secondary | ICD-10-CM | POA: Diagnosis not present

## 2021-04-18 DIAGNOSIS — R2689 Other abnormalities of gait and mobility: Secondary | ICD-10-CM | POA: Insufficient documentation

## 2021-04-18 NOTE — Therapy (Signed)
[facility] [address] [place], [place], [address] Phone: [phone]   Fax:  [phone]  Physical Therapy Treatment  Patient Details  Name: [name] MRN: [id number] Date of Birth: 07/08/1941 Referring Provider (PT): Metta Clines, DO   Encounter Date: 04/18/2021   PT End of Session - 04/18/21 1023     Visit Number 13    Number of Visits 17    Date for PT Re-Evaluation 04/29/21    Authorization Type UHC Medicare-Pt has been seen by [facility] this year; will likely need KX (maybe not-private Medicare?)    Progress Note Due on Visit 10    PT Start Time 1020    PT Stop Time 1100    PT Time Calculation (min) 40 min    Equipment Utilized During Treatment Gait belt    Activity Tolerance Patient tolerated treatment well    Behavior During Therapy [facility] for tasks assessed/performed             Past Medical History:  Diagnosis Date   Acute renal failure superimposed on stage 3 chronic kidney disease ([place]) 01/31/2020   Breast cancer ([place]) 2012   Cancer of central portion of right female breast ([place]) 09/04/2010   Patient has a long history of fibrocystic disease. 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 She underwent right total mastectomy with sentinel node biopsy and left total (prophylactic) mastectomy on 10/12/2010. She underwent immediate reconstruction with expander and AlloDerm placement. 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 One lymph   Complicated UTI (urinary tract infection) 01/31/2020   Diabetes mellitus    Dizziness 02/24/2019   Elevated LFTs 08/10/2014   Episodic lightheadedness 08/27/2019   Esophageal reflux 05/06/2008   Essential hypertension 02/16/2009   Family history of breast cancer    Family history of ovarian cancer    Fatigue 02/24/2019   Fatty liver 08/05/2015   Genetic testing 09/04/2017   Negative genetic testing on the common  hereditary cancer panel.  The Hereditary Gene Panel offered by [facility] includes sequencing and/or deletion duplication testing of the following 47 genes: APC, ATM, AXIN2, BARD1, BMPR1A, BRCA1, BRCA2, BRIP1, CDH1, CDK4, CDKN2A (p14ARF), CDKN2A (p16INK4a), CHEK2, CTNNA1, DICER1, EPCAM (Deletion/duplication testing only), GREM1 (promoter region deletion/duplicat   GERD (gastroesophageal reflux disease)    [name] syndrome    GILBERT'S SYNDROME 02/06/2007   Qualifier: Diagnosis of  By: Janelle Floor     History of breast cancer 08/04/2015   S/p b/l mastectomy   History of colonic polyps 06/06/2010   Annotation: destroyed, no clear adenomatous proven Qualifier: Diagnosis of  By: Carlean Purl MD, Tonna Boehringer E  2008 polyp 2013 neg     Hypertension    Hypertriglyceridemia 09/30/2007   statin intolerant  Father MI @ 63 Sister CVA > 65    Hypokalemia due to inadequate potassium intake 01/31/2020   IBS (irritable bowel syndrome)    Joint pain 09/02/2010   Lactic acidosis 01/31/2020   Osteoarthritis of left knee 08/04/2015   Significant arthritis in knees, limits her activity    Osteopenia 08/31/2013   [facility]  DEXA 03/20/2018: Osteopenia:  LFN -1.3, R--1.0, spine -0.6-statistically significant increase in BMD in all areas  Findings 08/22/13 : lowest T score -  1.3  @  R femoral neck (hip) ,3.4  %loss @ R femur &   4.1 % in spine   since  2012 Dexa 01/05/16: lowest  - L femur neck T -  1.6 Diagnosis: mild Osteopenia Rx: Fosamax remotely; now on Arimidex    Parkinsonian features 01/31/2020   Peripheral neuropathy 08/23/2018   Plantar fasciitis of right foot    Dr Oneta Rack   Poor balance 02/20/2018   S/P bilateral mastectomy 08/04/2015   Septic shock ([place]) 01/31/2020   Severe sepsis with acute organ dysfunction due to gram-negative bacteria ([place]) 01/31/2020   Transfusion history 1976   Type 2 diabetes mellitus with stage 3 chronic kidney disease, without long-term current use of insulin ([place]) 08/11/2008   Ophth, Dr  Kathrin Penner: no retinopathy  Diabetes maternal grandmother    Past Surgical History:  Procedure Laterality Date   ABDOMINAL HYSTERECTOMY  1976   with BSO due to infection from [place] IUD   [place]   @ [facility] & BSO   BREAST LUMPECTOMY     [id number], [phone]   CATARACT EXTRACTION, BILATERAL Bilateral 2018   COLONOSCOPY W/ POLYPECTOMY  2006   negative 2011; Dr Carlean Purl   MASTECTOMY W/ NODES PARTIAL  2012   double mastectomy with nodes taken out on right side    PLACEMENT OF BREAST IMPLANTS  04/2011   Dr Migdalia Dk, [place]      There were no vitals filed for this visit.   Subjective Assessment - 04/18/21 1023     Subjective No changes, no falls.  Been doing exercises.  Pt reports being more aware of wider base standing since last visit.    Patient is accompained by: Family member   husband   Pertinent History PMH:  DM, HTN, Gilbert's syndrome, IBS, closed fracture pelvis, breast cancer; UTI, hx of sepsis    Patient Stated Goals Pt's goal for therapy is to stand up and walk around without the walker.  Don't want to fall again.  Also want to work on strength (husband) for getting out of chair.    Currently in Pain? No/denies                          Exercises-husband present for review and performance of HEP.  PT provides cues for optimal technique, as well as rationale for exercises.  Side to side weightshift - 1 x daily - 5 x weekly - 1-2 sets - 10 reps-(cues for wider BOS) Heel Toe Raises with Counter Support - 1 x daily - 5 x weekly - 1-2 sets - 10 reps Staggered Stance Forward Backward Weight Shift with Counter Support - 1 x daily - 5 x weekly - 1-2 sets - 10 reps (cues for increased hinge at hips) Standing posture stretch - 1 x daily - 5 x weekly - 1-2 sets - 10 reps (cues for wide BOS)   Added 03/31/2021: Side Stepping with Counter Support - 1 x daily - 5 x weekly - 1-2 sets - 10 reps(cues for  weigthshift) Alternating Step Backward with Support - 1 x daily - 5 x weekly - 1-2 sets - 10 reps (cues for weigthshift)         [facility] Adult PT Treatment/Exercise - 04/18/21 0001       Ambulation/Gait   Ambulation/Gait Yes    Ambulation/Gait Assistance 5: Supervision    Ambulation/Gait Assistance Details Cues for R foot clearance with gait.    Ambulation Distance (Feet) 80 Feet   x 2, 60 ft x 2   Assistive device Rollator    Gait Pattern Step-through pattern;Decreased step length - right;Decreased trunk rotation;Decreased hip/knee flexion -  left;Shuffle;Festinating    Ambulation Surface Level;Indoor      Therapeutic Activites    Therapeutic Activities Other Therapeutic Activities    Other Therapeutic Activities Discussed optimal fitness/exercise routine at home; discussed timing of HEP (pt currently does this early in the morning before husband is awake).  Discussed potentially doing it another time of day when husband present and can cue her to slow pace if needed.  Discussed POC and both husband/pt feels she is improving and do not want to stop therapy yet (after next week)   Discussed what is better with therapy:  pt and husband report improved walking, more confidence with walking and turns.  What is still difficult at home:  strength in legs with sit to stand.  Discussed we will look at goals next week and try to extend another 2-3 weeks for transitioning out of therapy to be more independent with exercises and recommendations at home.      Neuro Re-ed    Neuro Re-ed Details  Practiced wide BOS and staggered stance wide BOS at locked rollator and at counter, discussing rationale for these positions for better balance with standing and weigthshfiting activities.  Discussed with husband present, incorporating how he can cue patient at home for improved awareness, improved carryover of wider foot positions at home.                    PT Education - 04/18/21 1543      Education Details See TA and NMR; also with husband present, reviewed full HEP    Person(s) Educated Patient;Spouse    Methods Explanation;Demonstration;Tactile cues    Comprehension Verbalized understanding;Returned demonstration              PT Short Term Goals - 03/31/21 1696       PT SHORT TERM GOAL #1   Title Pt will perform HEP with family supervision for improved strength, balance, transfers, and gait.  TARGET for all STGs 04/01/2021    Time 4    Period Weeks    Status Achieved      PT SHORT TERM GOAL #2   Title Pt will improve 5x sit<>stand to less than or equal to 15 sec to demonstrate improved functional strength and transfer efficiency.    Baseline 20.69 with UE support; 22 secs with better form/technique    Time 4    Period Weeks    Status Not Met      PT SHORT TERM GOAL #3   Title Pt will improve TUG score to less than or equal to 18 seconds for decreased fall risk.    Baseline 23.44 sec; 21.65 sec avg    Time 4    Period Weeks    Status Not Met      PT SHORT TERM GOAL #4   Title Berg Balance score to be assessed, wtih goal to be written as approrpiate.    Baseline 03/28/21:  34/56 initial    Time 4    Period Weeks    Status Achieved      PT SHORT TERM GOAL #5   Title Pt/husband will verbalize understanding of fall prevention in home environment.    Time 4    Period Weeks    Status Achieved               PT Long Term Goals - 03/28/21 7893       PT LONG TERM GOAL #1   Title Pt will perform HEP with supervision  of family to address balance, gait, functional mobility.  TARGET all LTGs 04/29/2021    Time 8    Period Weeks    Status New      PT LONG TERM GOAL #2   Title Pt will improve 5x sit<>stand to less than or equal to 12.5 sec to demonstrate improved functional strength and transfer efficiency.    Time 8    Period Weeks    Status New      PT LONG TERM GOAL #3   Title Pt will improve TUG score to less than or equal to 15 seconds for  decreased fall risk.    Time 8    Period Weeks    Status New      PT LONG TERM GOAL #4   Title Berg score to imporve by 5 points from baseline for decreased fall rsik.    Baseline 34/56 initial    Time 8    Period Weeks    Status New      PT LONG TERM GOAL #5   Title Pt will improve gait velocity to at least 2.62 ft/sec for improved gait efficiency and safety.    Baseline 2.07 ft/sec    Time 8    Period Weeks    Status New                   Plan - 04/18/21 1545     Clinical Impression Statement Reviewed static standing positions with wider BOS with pt and husband present today, to assist with balance.  Pt/husband verbalize and demo understanding.  Pt's husband present and requests full review of HEP.  While pt does well with HEP exercises in clinic, husband questions how accurate she is performing at home.  Cues and additional written instructions provided on her HEP handouts and discussed having pt perform exercises with husband present.  Discussed that husband's cues for HEP, standing positions may help with overlal safety with balance and gait.  Pt will continue to benefit from skilled PT to fruther address balance, gait for improved safety and decreased fall risk.    Personal Factors and Comorbidities Comorbidity 3+    Comorbidities PMH:  DM, HTN, Gilbert's syndrome, IBS, closed fracture pelvis, breast cancer, hx of sepsis    Examination-Activity Limitations Locomotion Level;Transfers;Stand;Stairs    Examination-Participation Restrictions Community Activity;Meal Prep    Stability/Clinical Decision Making Evolving/Moderate complexity    Rehab Potential Good    PT Frequency 2x / week    PT Duration 8 weeks    PT Treatment/Interventions ADLs/Self Care Home Management;Gait training;Stair training;Functional mobility training;Therapeutic activities;Therapeutic exercise;Balance training;Patient/family education;Neuromuscular re-education    PT Next Visit Plan Continue review  with pt/husband foot positions and static standing positions to help avoid posterior LOB.  Continue to work on hip/ankle/step strategy for balance-work on more variable perturbations, turns/figure 8 with gait.  Foot clearance for RLE with gait.  Work towards [employer]. Incorporate functional strengthening  ([place]:  I spoke with pt/husband; they are NOT ready to d/c, so I told them you would check goals next week and I will plan to recert for a few more weeks to help her transition out of therapy.  They scheduled today)    Consulted and Agree with Plan of Care Patient;Family member/caregiver             Patient will benefit from skilled therapeutic intervention in order to improve the following deficits and impairments:  Abnormal gait, Difficulty walking, Decreased balance, Decreased mobility, Decreased strength,  Postural dysfunction  Visit Diagnosis: Unsteadiness on feet  Other abnormalities of gait and mobility  Other symptoms and signs involving the nervous system     Problem List Patient Active Problem List   Diagnosis Date Noted   Head injury, acute, initial encounter 02/12/2021   UTI (urinary tract infection) 01/17/2021   Aortic atherosclerosis ([place]) 08/27/2020   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   Parkinson disease ([place]) 08/26/2020   Paroxysmal atrial fibrillation ([place]) 94/76/5465   Complicated UTI (urinary tract infection) 01/31/2020   CKD (chronic kidney disease) stage 3, GFR 30-59 ml/min ([facility]) 01/31/2020   Hypokalemia due to inadequate potassium intake 01/31/2020   Severe sepsis with acute organ dysfunction due to Gram negative bacteria ([place]) 01/31/2020   Episodic lightheadedness 08/27/2019   Dizziness 02/24/2019   Peripheral neuropathy 08/23/2018   Poor balance 02/20/2018   Genetic testing 09/04/2017   Family history of breast cancer    Family history of ovarian cancer    Fatty liver 08/05/2015   History of breast cancer 08/04/2015   S/P bilateral mastectomy 08/04/2015    Osteoarthritis of left knee 08/04/2015   Elevated LFTs 08/10/2014   Osteopenia 08/31/2013   Transfusion history 08/18/2013   IBS (irritable bowel syndrome) 08/18/2013   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 04/18/2021, 3:51 PM Frazier Butt., PT   [facility] [address] [place], [place], [address] Phone: [phone]   Fax:  [phone]  Name: [name] MRN: [id number] Date of Birth: [birth year]

## 2021-04-20 ENCOUNTER — Ambulatory Visit: Payer: Medicare Other | Admitting: Physical Therapy

## 2021-04-20 ENCOUNTER — Encounter: Payer: Self-pay | Admitting: Physical Therapy

## 2021-04-20 ENCOUNTER — Other Ambulatory Visit: Payer: Self-pay

## 2021-04-20 DIAGNOSIS — M6281 Muscle weakness (generalized): Secondary | ICD-10-CM | POA: Diagnosis not present

## 2021-04-20 DIAGNOSIS — R2681 Unsteadiness on feet: Secondary | ICD-10-CM

## 2021-04-20 DIAGNOSIS — R2689 Other abnormalities of gait and mobility: Secondary | ICD-10-CM

## 2021-04-20 DIAGNOSIS — R29818 Other symptoms and signs involving the nervous system: Secondary | ICD-10-CM

## 2021-04-20 NOTE — Therapy (Signed)
[facility] [address] [place], [place], [address] Phone: [phone]   Fax:  [phone]  Physical Therapy Treatment  Patient Details  Name: [name] MRN: [id number] Date of Birth: 02/09/1941 Referring Provider (PT): Adam Jaffe, DO   Encounter Date: 04/20/2021   PT End of Session - 04/20/21 1202     Visit Number 14    Number of Visits 17    Date for PT Re-Evaluation 04/29/21    Authorization Type UHC Medicare-Pt has been seen by [facility] this year; will likely need KX (maybe not-private Medicare?)    Progress Note Due on Visit 10    PT Start Time 1018    PT Stop Time 1058    PT Time Calculation (min) 40 min    Equipment Utilized During Treatment Gait belt    Activity Tolerance Patient tolerated treatment well    Behavior During Therapy WFL for tasks assessed/performed             Past Medical History:  Diagnosis Date   Acute renal failure superimposed on stage 3 chronic kidney disease (HCC) 01/31/2020   Breast cancer (HCC) 2012   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 She underwent right total mastectomy with sentinel node biopsy and left total (prophylactic) mastectomy on 10/12/2010. She underwent immediate reconstruction with expander and AlloDerm placement. 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 One lymph   Complicated UTI (urinary tract infection) 01/31/2020   Diabetes mellitus    Dizziness 02/24/2019   Elevated LFTs 08/10/2014   Episodic lightheadedness 08/27/2019   Esophageal reflux 05/06/2008   Essential hypertension 02/16/2009   Family history of breast cancer    Family history of ovarian cancer    Fatigue 02/24/2019   Fatty liver 08/05/2015   Genetic testing 09/04/2017   Negative genetic testing on the common  hereditary cancer panel.  The Hereditary Gene Panel offered by [facility] includes sequencing and/or deletion duplication testing of the following 47 genes: APC, ATM, AXIN2, BARD1, BMPR1A, BRCA1, BRCA2, BRIP1, CDH1, CDK4, CDKN2A (p14ARF), CDKN2A (p16INK4a), CHEK2, CTNNA1, DICER1, EPCAM (Deletion/duplication testing only), GREM1 (promoter region deletion/duplicat   GERD (gastroesophageal reflux disease)    Gilbert syndrome    GILBERT'S SYNDROME 02/06/2007   Qualifier: Diagnosis of  By: Bixby, Kathleen     History of breast cancer 08/04/2015   S/p b/l mastectomy   History of colonic polyps 06/06/2010   Annotation: destroyed, no clear adenomatous proven Qualifier: Diagnosis of  By: Gessner MD, FACG, Carl E  2008 polyp 2013 neg     Hypertension    Hypertriglyceridemia 09/30/2007   statin intolerant  Father MI @ 72 Sister CVA > 65    Hypokalemia due to inadequate potassium intake 01/31/2020   IBS (irritable bowel syndrome)    Joint pain 09/02/2010   Lactic acidosis 01/31/2020   Osteoarthritis of left knee 08/04/2015   Significant arthritis in knees, limits her activity    Osteopenia 08/31/2013   [facility]  DEXA 03/20/2018: Osteopenia:  LFN -1.3, R--1.0, spine -0.6-statistically significant increase in BMD in all areas  Findings 08/22/13 : lowest T score -  1.3  @  R femoral neck (hip) ,3.4  %loss @ R femur &   4.1 % in spine   since  2012 Dexa 01/05/16: lowest  - L femur neck T -  1.6 Diagnosis: mild Osteopenia Rx: Fosamax remotely; now on Arimidex    Parkinsonian features 01/31/2020   Peripheral neuropathy 08/23/2018   Plantar fasciitis of right foot    Dr Oneta Rack   Poor balance 02/20/2018   S/P bilateral mastectomy 08/04/2015   Septic shock ([place]) 01/31/2020   Severe sepsis with acute organ dysfunction due to gram-negative bacteria ([place]) 01/31/2020   Transfusion history 1976   Type 2 diabetes mellitus with stage 3 chronic kidney disease, without long-term current use of insulin ([place]) 08/11/2008   Ophth, Dr  Kathrin Penner: no retinopathy  Diabetes maternal grandmother    Past Surgical History:  Procedure Laterality Date   ABDOMINAL HYSTERECTOMY  1976   with BSO due to infection from [place] IUD   [place]   @ [facility] & BSO   BREAST LUMPECTOMY     [id number], [phone]   CATARACT EXTRACTION, BILATERAL Bilateral 2018   COLONOSCOPY W/ POLYPECTOMY  2006   negative 2011; Dr Carlean Purl   MASTECTOMY W/ NODES PARTIAL  2012   double mastectomy with nodes taken out on right side    PLACEMENT OF BREAST IMPLANTS  04/2011   Dr Migdalia Dk, [place]      There were no vitals filed for this visit.   Subjective Assessment - 04/20/21 1021     Subjective No changes, no falls.    Patient is accompained by: Family member   husband   Pertinent History PMH:  DM, HTN, Gilbert's syndrome, IBS, closed fracture pelvis, breast cancer; UTI, hx of sepsis    Patient Stated Goals Pt's goal for therapy is to stand up and walk around without the walker.  Don't want to fall again.  Also want to work on strength (husband) for getting out of chair.    Currently in Pain? No/denies                               [facility] Adult PT Treatment/Exercise - 04/20/21 1021       Transfers   Transfers Sit to Stand;Stand to Sit    Sit to Stand 5: Supervision;With upper extremity assist;From chair/3-in-1;From bed    Number of Reps 2 sets   5 reps - plus additional reps throughout session   Comments cues to take time with transfers, cues for scooting out towards edge, incr forward lean and weight shift for sit <> stand      Ambulation/Gait   Ambulation/Gait Yes    Ambulation/Gait Assistance 5: Supervision    Ambulation/Gait Assistance Details Cues for R foot clearance with gait.    Ambulation Distance (Feet) 230 Feet   x1   Assistive device Rollator    Gait Pattern Step-through pattern;Decreased step length - right;Decreased trunk rotation;Decreased hip/knee flexion  - left;Shuffle;Festinating    Ambulation Surface Level;Indoor      Knee/Hip Exercises: Standing   Hip Abduction Stengthening;Both;2 sets;5 reps    Abduction Limitations cues for technique and posutre    Functional Squat 1 set   x10 reps   Functional Squat Limitations standing at rollator, x10 reps gentle mini squat with cues for technique      Knee/Hip Exercises: Seated   Long Arc Quad Strengthening;AROM;Both;2 sets;10 reps    Long Arc Quad Limitations 1st set no weight, 2nd with 2# - cues for isometric hold    Marching Strengthening;AROM;Right;1 set;10 reps    Marching Limitations with 2# ankle weight stepping out and over  yardstick and then back into midline for hip flexion/ABD    Abd/Adduction Limitations seated hip ADD x10 reps with ball squeeze                 Balance Exercises - 04/20/21 1035       Balance Exercises: Standing   Step Ups Forward;4 inch;UE support 2;UE support 1;Limitations    Step Ups Limitations 2 x 5 reps B alternating legs, cues for sequencing at times and for tall posture after stepping up    Marching Solid surface;Upper extremity assist 2;Static;10 reps    Marching Limitations Tactile cues for increased RLE step height, BUE support    Heel Raises Both;10 reps   BUE support   Toe Raise Both;10 reps   BUE support                PT Short Term Goals - 03/31/21 0938       PT SHORT TERM GOAL #1   Title Pt will perform HEP with family supervision for improved strength, balance, transfers, and gait.  TARGET for all STGs 04/01/2021    Time 4    Period Weeks    Status Achieved      PT SHORT TERM GOAL #2   Title Pt will improve 5x sit<>stand to less than or equal to 15 sec to demonstrate improved functional strength and transfer efficiency.    Baseline 20.69 with UE support; 22 secs with better form/technique    Time 4    Period Weeks    Status Not Met      PT SHORT TERM GOAL #3   Title Pt will improve TUG score to less than or equal to 18  seconds for decreased fall risk.    Baseline 23.44 sec; 21.65 sec avg    Time 4    Period Weeks    Status Not Met      PT SHORT TERM GOAL #4   Title Berg Balance score to be assessed, wtih goal to be written as approrpiate.    Baseline 03/28/21:  34/56 initial    Time 4    Period Weeks    Status Achieved      PT SHORT TERM GOAL #5   Title Pt/husband will verbalize understanding of fall prevention in home environment.    Time 4    Period Weeks    Status Achieved               PT Long Term Goals - 03/28/21 0958       PT LONG TERM GOAL #1   Title Pt will perform HEP with supervision of family to address balance, gait, functional mobility.  TARGET all LTGs 04/29/2021    Time 8    Period Weeks    Status New      PT LONG TERM GOAL #2   Title Pt will improve 5x sit<>stand to less than or equal to 12.5 sec to demonstrate improved functional strength and transfer efficiency.    Time 8    Period Weeks    Status New      PT LONG TERM GOAL #3   Title Pt will improve TUG score to less than or equal to 15 seconds for decreased fall risk.    Time 8    Period Weeks    Status New      PT LONG TERM GOAL #4   Title Berg score to imporve by 5 points from baseline for decreased fall rsik.      Baseline 34/56 initial    Time 8    Period Weeks    Status New      PT LONG TERM GOAL #5   Title Pt will improve gait velocity to at least 2.62 ft/sec for improved gait efficiency and safety.    Baseline 2.07 ft/sec    Time 8    Period Weeks    Status New                   Plan - 04/20/21 1205     Clinical Impression Statement Today's skilled session focused on transfer training with sit <> stands and BLE strengthening in standing and seated. Pt tolerated session well, does still need cues for proper technique and slowed pace during transfer to avoid retropulsion. Will continue to progress towards LTGs.    Personal Factors and Comorbidities Comorbidity 3+    Comorbidities PMH:   DM, HTN, Gilbert's syndrome, IBS, closed fracture pelvis, breast cancer, hx of sepsis    Examination-Activity Limitations Locomotion Level;Transfers;Stand;Stairs    Examination-Participation Restrictions Community Activity;Meal Prep    Stability/Clinical Decision Making Evolving/Moderate complexity    Rehab Potential Good    PT Frequency 2x / week    PT Duration 8 weeks    PT Treatment/Interventions ADLs/Self Care Home Management;Gait training;Stair training;Functional mobility training;Therapeutic activities;Therapeutic exercise;Balance training;Patient/family education;Neuromuscular re-education    PT Next Visit Plan check LTGs and do re-cert. Continue review with pt/husband foot positions and static standing positions to help avoid posterior LOB.  Continue to work on hip/ankle/step strategy for balance-work on more variable perturbations, turns/figure 8 with gait.  Foot clearance for RLE with gait.  Work towards LTGs. Incorporate functional strengthening    Consulted and Agree with Plan of Care Patient;Family member/caregiver             Patient will benefit from skilled therapeutic intervention in order to improve the following deficits and impairments:  Abnormal gait, Difficulty walking, Decreased balance, Decreased mobility, Decreased strength, Postural dysfunction  Visit Diagnosis: Other abnormalities of gait and mobility  Unsteadiness on feet  Other symptoms and signs involving the nervous system  Muscle weakness (generalized)     Problem List Patient Active Problem List   Diagnosis Date Noted   Head injury, acute, initial encounter 02/12/2021   UTI (urinary tract infection) 01/17/2021   Aortic atherosclerosis (HCC) 08/27/2020   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   Parkinson disease (HCC) 08/26/2020   Paroxysmal atrial fibrillation (HCC) 02/25/2020   Complicated UTI (urinary tract infection) 01/31/2020   CKD (chronic kidney disease) stage 3, GFR 30-59 ml/min (HCC)  01/31/2020   Hypokalemia due to inadequate potassium intake 01/31/2020   Severe sepsis with acute organ dysfunction due to Gram negative bacteria (HCC) 01/31/2020   Episodic lightheadedness 08/27/2019   Dizziness 02/24/2019   Peripheral neuropathy 08/23/2018   Poor balance 02/20/2018   Genetic testing 09/04/2017   Family history of breast cancer    Family history of ovarian cancer    Fatty liver 08/05/2015   History of breast cancer 08/04/2015   S/P bilateral mastectomy 08/04/2015   Osteoarthritis of left knee 08/04/2015   Elevated LFTs 08/10/2014   Osteopenia 08/31/2013   Transfusion history 08/18/2013   IBS (irritable bowel syndrome) 08/18/2013   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

## 2021-04-25 ENCOUNTER — Ambulatory Visit: Payer: Medicare Other | Admitting: Physical Therapy

## 2021-04-27 ENCOUNTER — Ambulatory Visit: Payer: Medicare Other | Admitting: Physical Therapy

## 2021-04-27 ENCOUNTER — Encounter: Payer: Self-pay | Admitting: Physical Therapy

## 2021-04-27 ENCOUNTER — Other Ambulatory Visit: Payer: Self-pay

## 2021-04-27 DIAGNOSIS — R2681 Unsteadiness on feet: Secondary | ICD-10-CM

## 2021-04-27 DIAGNOSIS — R2689 Other abnormalities of gait and mobility: Secondary | ICD-10-CM | POA: Diagnosis not present

## 2021-04-27 DIAGNOSIS — R29818 Other symptoms and signs involving the nervous system: Secondary | ICD-10-CM

## 2021-04-27 DIAGNOSIS — M6281 Muscle weakness (generalized): Secondary | ICD-10-CM | POA: Diagnosis not present

## 2021-04-27 NOTE — Therapy (Signed)
[facility] [address] [place], [place], [address] Phone: [phone]   Fax:  [phone]  Physical Therapy Treatment  Patient Details  Name: [name] MRN: [id number] Date of Birth: 1940/09/23 Referring Provider (PT): Metta Clines, DO   Encounter Date: 04/27/2021   PT End of Session - 04/27/21 1154     Visit Number 15    Number of Visits 17    Date for PT Re-Evaluation 04/29/21    Authorization Type UHC Medicare-Pt has been seen by [facility] this year; will likely need KX (maybe not-private Medicare?)    Progress Note Due on Visit 10    PT Start Time 1102    PT Stop Time 1144    PT Time Calculation (min) 42 min    Equipment Utilized During Treatment Gait belt    Activity Tolerance Patient tolerated treatment well    Behavior During Therapy [facility] for tasks assessed/performed             Past Medical History:  Diagnosis Date   Acute renal failure superimposed on stage 3 chronic kidney disease ([place]) 01/31/2020   Breast cancer ([place]) 2012   Cancer of central portion of right female breast ([place]) 09/04/2010   Patient has a long history of fibrocystic disease. 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 She underwent right total mastectomy with sentinel node biopsy and left total (prophylactic) mastectomy on 10/12/2010. She underwent immediate reconstruction with expander and AlloDerm placement. 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 One lymph   Complicated UTI (urinary tract infection) 01/31/2020   Diabetes mellitus    Dizziness 02/24/2019   Elevated LFTs 08/10/2014   Episodic lightheadedness 08/27/2019   Esophageal reflux 05/06/2008   Essential hypertension 02/16/2009   Family history of breast cancer    Family history of ovarian cancer    Fatigue 02/24/2019   Fatty liver 08/05/2015   Genetic testing 09/04/2017   Negative genetic testing on the  common hereditary cancer panel.  The Hereditary Gene Panel offered by [facility] includes sequencing and/or deletion duplication testing of the following 47 genes: APC, ATM, AXIN2, BARD1, BMPR1A, BRCA1, BRCA2, BRIP1, CDH1, CDK4, CDKN2A (p14ARF), CDKN2A (p16INK4a), CHEK2, CTNNA1, DICER1, EPCAM (Deletion/duplication testing only), GREM1 (promoter region deletion/duplicat   GERD (gastroesophageal reflux disease)    [name] syndrome    GILBERT'S SYNDROME 02/06/2007   Qualifier: Diagnosis of  By: Janelle Floor     History of breast cancer 08/04/2015   S/p b/l mastectomy   History of colonic polyps 06/06/2010   Annotation: destroyed, no clear adenomatous proven Qualifier: Diagnosis of  By: Carlean Purl MD, Tonna Boehringer E  2008 polyp 2013 neg     Hypertension    Hypertriglyceridemia 09/30/2007   statin intolerant  Father MI @ 86 Sister CVA > 65    Hypokalemia due to inadequate potassium intake 01/31/2020   IBS (irritable bowel syndrome)    Joint pain 09/02/2010   Lactic acidosis 01/31/2020   Osteoarthritis of left knee 08/04/2015   Significant arthritis in knees, limits her activity    Osteopenia 08/31/2013   [facility]  DEXA 03/20/2018: Osteopenia:  LFN -1.3, R--1.0, spine -0.6-statistically significant increase in BMD in all areas  Findings 08/22/13 : lowest T score -  1.3  @  R femoral neck (hip) ,3.4  %loss @ R femur &   4.1 % in spine   since  2012 Dexa 01/05/16: lowest  - L femur neck T -  1.6 Diagnosis: mild Osteopenia Rx: Fosamax remotely; now on Arimidex    Parkinsonian features 01/31/2020   Peripheral neuropathy 08/23/2018   Plantar fasciitis of right foot    Dr Oneta Rack   Poor balance 02/20/2018   S/P bilateral mastectomy 08/04/2015   Septic shock ([place]) 01/31/2020   Severe sepsis with acute organ dysfunction due to gram-negative bacteria ([place]) 01/31/2020   Transfusion history 1976   Type 2 diabetes mellitus with stage 3 chronic kidney disease, without long-term current use of insulin ([place]) 08/11/2008   Ophth, Dr  Kathrin Penner: no retinopathy  Diabetes maternal grandmother    Past Surgical History:  Procedure Laterality Date   ABDOMINAL HYSTERECTOMY  1976   with BSO due to infection from [place] IUD   [place]   @ [facility] & BSO   BREAST LUMPECTOMY     [id number], [phone]   CATARACT EXTRACTION, BILATERAL Bilateral 2018   COLONOSCOPY W/ POLYPECTOMY  2006   negative 2011; Dr Carlean Purl   MASTECTOMY W/ NODES PARTIAL  2012   double mastectomy with nodes taken out on right side    PLACEMENT OF BREAST IMPLANTS  04/2011   Dr Migdalia Dk, [facility]      There were no vitals filed for this visit.   Subjective Assessment - 04/27/21 1105     Subjective No changes, no falls.    Patient is accompained by: Family member   husband   Pertinent History PMH:  DM, HTN, Gilbert's syndrome, IBS, closed fracture pelvis, breast cancer; UTI, hx of sepsis    Patient Stated Goals Pt's goal for therapy is to stand up and walk around without the walker.  Don't want to fall again.  Also want to work on strength (husband) for getting out of chair.    Currently in Pain? No/denies                [facility] PT Assessment - 04/27/21 1109       Timed Up and Go Test   Normal TUG (seconds) 19.53   with rollator                          [facility] Adult PT Treatment/Exercise - 04/27/21 1109       Transfers   Transfers Sit to Stand;Stand to Sit    Sit to Stand 5: Supervision;With upper extremity assist;From chair/3-in-1;From bed    Five time sit to stand comments  26.37 seconds with BUE support, with good technique    Number of Reps 1 set   an additional 1 set of 5 from standard height chair with BUE support   Comments 30 second chair stand: 6 sit <> stands with BUE support from chair. plus an additional x5 reps at end of session from elevated mat table without UE support, with initial cues to scoot towards edge and incr forward weight shift       Ambulation/Gait   Ambulation/Gait Yes    Ambulation/Gait Assistance 5: Supervision    Ambulation/Gait Assistance Details cues for R foot clearance throughout session    Assistive device Rollator    Gait Pattern Step-through pattern;Decreased step length - right;Decreased trunk rotation;Decreased hip/knee flexion - left;Shuffle;Festinating    Ambulation Surface Indoor;Level    Gait velocity 18.35 seconds = 1.78 ft/sec    Gait Comments worked on turns with rollator in narrow spaces with cues for marching turns, practiced to R and L, pt initially performing turns pivoting on  her feet                 Balance Exercises - 04/27/21 1127       Balance Exercises: Standing   Standing Eyes Opened Wide (BOA);Foam/compliant surface;Limitations    Standing Eyes Opened Limitations on blue air ex; wide BOS reaching outside of BOS in different directions to tap cone x10 reps B, pt able to perform without UE support    Step Ups Forward;Limitations    Step Ups Limitations on blue air ex step up/up and down/down x10 reps alternating UE support > none    Marching Foam/compliant surface;Upper extremity assist 2;Static;10 reps    Marching Limitations cues for step height, performed on blue air ex               PT Education - 04/27/21 1154     Education Details LTG results, areas to continue to work on with therapy for re-cert    [employer]) Educated Patient    Methods Explanation    Comprehension Verbalized understanding              PT Short Term Goals - 03/31/21 0938       PT SHORT TERM GOAL #1   Title Pt will perform HEP with family supervision for improved strength, balance, transfers, and gait.  TARGET for all STGs 04/01/2021    Time 4    Period Weeks    Status Achieved      PT SHORT TERM GOAL #2   Title Pt will improve 5x sit<>stand to less than or equal to 15 sec to demonstrate improved functional strength and transfer efficiency.    Baseline 20.69 with UE support; 22 secs  with better form/technique    Time 4    Period Weeks    Status Not Met      PT SHORT TERM GOAL #3   Title Pt will improve TUG score to less than or equal to 18 seconds for decreased fall risk.    Baseline 23.44 sec; 21.65 sec avg    Time 4    Period Weeks    Status Not Met      PT SHORT TERM GOAL #4   Title Berg Balance score to be assessed, wtih goal to be written as approrpiate.    Baseline 03/28/21:  34/56 initial    Time 4    Period Weeks    Status Achieved      PT SHORT TERM GOAL #5   Title Pt/husband will verbalize understanding of fall prevention in home environment.    Time 4    Period Weeks    Status Achieved               PT Long Term Goals - 04/27/21 1110       PT LONG TERM GOAL #1   Title Pt will perform HEP with supervision of family to address balance, gait, functional mobility.  TARGET all LTGs 04/29/2021    Time 8    Period Weeks    Status New      PT LONG TERM GOAL #2   Title Pt will improve 5x sit<>stand to less than or equal to 12.5 sec to demonstrate improved functional strength and transfer efficiency.    Baseline 26.37 seconds on 04/27/21, pt demonstrating improved technique    Time 8    Period Weeks    Status Not Met      PT LONG TERM GOAL #3   Title Pt will improve  TUG score to less than or equal to 15 seconds for decreased fall risk.    Baseline 19.53 on 04/27/21 with rollator    Time 8    Period Weeks    Status Not Met      PT LONG TERM GOAL #4   Title Berg score to imporve by 5 points from baseline for decreased fall rsik.    Baseline 34/56 initial    Time 8    Period Weeks    Status New      PT LONG TERM GOAL #5   Title Pt will improve gait velocity to at least 2.62 ft/sec for improved gait efficiency and safety.    Baseline 2.07 ft/sec; 18.35 seconds = 1.78 ft/sec on 04/27/21, pt focusing on more deliberate pace    Time 8    Period Weeks    Status Not Met                   Plan - 04/27/21 1156     Clinical  Impression Statement Began to check pt's LTGs today. Pt did not meet LTGs #2,3 and 5 today. Pt with incr time with 5x sit <> stand today compared to eval/STG, however pt demonstrating improved technique without retropulsion. Pt with improved TUG time with rollator, however not quite to goal level. Pt's gait speed decr to 1.78 ft/sec (previously 2.07 ft/sec), but pt is working on slowed pace to focus on RLE foot clearance with each step. Remainder of session focused on sit <> stand training, narrow turns with rollator with use of marching, and balance on unlevel surfaces. Will check remainder of LTGs at next session and perform re-cert.    Personal Factors and Comorbidities Comorbidity 3+    Comorbidities PMH:  DM, HTN, Gilbert's syndrome, IBS, closed fracture pelvis, breast cancer, hx of sepsis    Examination-Activity Limitations Locomotion Level;Transfers;Stand;Stairs    Examination-Participation Restrictions Community Activity;Meal Prep    Stability/Clinical Decision Making Evolving/Moderate complexity    Rehab Potential Good    PT Frequency 2x / week    PT Duration 8 weeks    PT Treatment/Interventions ADLs/Self Care Home Management;Gait training;Stair training;Functional mobility training;Therapeutic activities;Therapeutic exercise;Balance training;Patient/family education;Neuromuscular re-education    PT Next Visit Plan check remainder of LTGs and do re-cert. Continue review with pt/husband foot positions and static standing positions to help avoid posterior LOB.  Continue to work on hip/ankle/step strategy for balance-work on more variable perturbations, turns/figure 8 with gait.  Foot clearance for RLE with gait.  Work towards [employer]. Incorporate functional strengthening    Consulted and Agree with Plan of Care Patient;Family member/caregiver             Patient will benefit from skilled therapeutic intervention in order to improve the following deficits and impairments:  Abnormal gait,  Difficulty walking, Decreased balance, Decreased mobility, Decreased strength, Postural dysfunction  Visit Diagnosis: Other abnormalities of gait and mobility  Unsteadiness on feet  Other symptoms and signs involving the nervous system     Problem List Patient Active Problem List   Diagnosis Date Noted   Head injury, acute, initial encounter 02/12/2021   UTI (urinary tract infection) 01/17/2021   Aortic atherosclerosis ([place]) 08/27/2020   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   Parkinson disease ([place]) 08/26/2020   Paroxysmal atrial fibrillation ([place]) 33/82/5053   Complicated UTI (urinary tract infection) 01/31/2020   CKD (chronic kidney disease) stage 3, GFR 30-59 ml/min ([place]) 01/31/2020   Hypokalemia due to inadequate potassium intake 01/31/2020  Severe sepsis with acute organ dysfunction due to Gram negative bacteria ([facility]) 01/31/2020   Episodic lightheadedness 08/27/2019   Dizziness 02/24/2019   Peripheral neuropathy 08/23/2018   Poor balance 02/20/2018   Genetic testing 09/04/2017   Family history of breast cancer    Family history of ovarian cancer    Fatty liver 08/05/2015   History of breast cancer 08/04/2015   S/P bilateral mastectomy 08/04/2015   Osteoarthritis of left knee 08/04/2015   Elevated LFTs 08/10/2014   Osteopenia 08/31/2013   Transfusion history 08/18/2013   IBS (irritable bowel syndrome) 08/18/2013   Cancer of central portion of right female breast ([place]) 09/04/2010   Joint pain 09/02/2010   History of colonic polyps 06/06/2010   Essential hypertension 02/16/2009   Type 2 diabetes mellitus with stage 3 chronic kidney disease, without long-term current use of insulin ([place]) 08/11/2008   Hypertriglyceridemia 09/30/2007   GILBERT'S SYNDROME 02/06/2007    Arliss Journey, PT, DPT  04/27/2021, 11:59 AM  [facility] [address] [place], [place], [address] Phone: [phone]   Fax:   [phone]  Name: FATINA SPRANKLE MRN: [id number] Date of Birth: 1941/08/29

## 2021-04-29 ENCOUNTER — Ambulatory Visit: Payer: Medicare Other | Admitting: Physical Therapy

## 2021-04-29 ENCOUNTER — Other Ambulatory Visit: Payer: Self-pay

## 2021-04-29 DIAGNOSIS — R2681 Unsteadiness on feet: Secondary | ICD-10-CM | POA: Diagnosis not present

## 2021-04-29 DIAGNOSIS — R29818 Other symptoms and signs involving the nervous system: Secondary | ICD-10-CM

## 2021-04-29 DIAGNOSIS — R2689 Other abnormalities of gait and mobility: Secondary | ICD-10-CM

## 2021-04-29 DIAGNOSIS — M6281 Muscle weakness (generalized): Secondary | ICD-10-CM | POA: Diagnosis not present

## 2021-04-29 NOTE — Therapy (Signed)
[facility] [address] [place], [place], [address] Phone: [phone]   Fax:  [phone]  Physical Therapy Treatment/Re-Cert  Patient Details  Name: [name] MRN: [id number] Date of Birth: [birth year] Referring Provider (PT): Metta Clines, DO   Encounter Date: 04/29/2021   PT End of Session - 04/29/21 1317     Visit Number 16    Number of Visits 20    Date for PT Re-Evaluation 34/74/25   per re-cert on 9/56/38   Authorization Type Loraine has been seen by [facility] this year; will likely need KX (maybe not-private Medicare?)    Progress Note Due on Visit 10    PT Start Time 1232    PT Stop Time 1314    PT Time Calculation (min) 42 min    Equipment Utilized During Treatment Gait belt    Activity Tolerance Patient tolerated treatment well    Behavior During Therapy [facility] for tasks assessed/performed             Past Medical History:  Diagnosis Date   Acute renal failure superimposed on stage 3 chronic kidney disease ([place]) 01/31/2020   Breast cancer ([place]) 2012   Cancer of central portion of right female breast ([place]) 09/04/2010   Patient has a long history of fibrocystic disease. 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 She underwent right total mastectomy with sentinel node biopsy and left total (prophylactic) mastectomy on 10/12/2010. She underwent immediate reconstruction with expander and AlloDerm placement. 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 One lymph   Complicated UTI (urinary tract infection) 01/31/2020   Diabetes mellitus    Dizziness 02/24/2019   Elevated LFTs 08/10/2014   Episodic lightheadedness 08/27/2019   Esophageal reflux 05/06/2008   Essential hypertension 02/16/2009   Family history of breast cancer    Family history of ovarian cancer    Fatigue 02/24/2019   Fatty liver 08/05/2015   Genetic testing 09/04/2017    Negative genetic testing on the common hereditary cancer panel.  The Hereditary Gene Panel offered by [facility] includes sequencing and/or deletion duplication testing of the following 47 genes: APC, ATM, AXIN2, BARD1, BMPR1A, BRCA1, BRCA2, BRIP1, CDH1, CDK4, CDKN2A (p14ARF), CDKN2A (p16INK4a), CHEK2, CTNNA1, DICER1, EPCAM (Deletion/duplication testing only), GREM1 (promoter region deletion/duplicat   GERD (gastroesophageal reflux disease)    [name] syndrome    GILBERT'S SYNDROME 02/06/2007   Qualifier: Diagnosis of  By: Janelle Floor     History of breast cancer 08/04/2015   S/p b/l mastectomy   History of colonic polyps 06/06/2010   Annotation: destroyed, no clear adenomatous proven Qualifier: Diagnosis of  By: Carlean Purl MD, Tonna Boehringer E  2008 polyp 2013 neg     Hypertension    Hypertriglyceridemia 09/30/2007   statin intolerant  Father MI @ 62 Sister CVA > 65    Hypokalemia due to inadequate potassium intake 01/31/2020   IBS (irritable bowel syndrome)    Joint pain 09/02/2010   Lactic acidosis 01/31/2020   Osteoarthritis of left knee 08/04/2015   Significant arthritis in knees, limits her activity    Osteopenia 08/31/2013   [facility]  DEXA 03/20/2018: Osteopenia:  LFN -1.3, R--1.0, spine -0.6-statistically significant increase in BMD in all areas  Findings 08/22/13 : lowest T score -  1.3  @  R femoral neck (hip) ,3.4  %loss @ R femur &   4.1 % in spine   since  2012 Dexa 01/05/16: lowest  -  L femur neck T -1.6 Diagnosis: mild Osteopenia Rx: Fosamax remotely; now on Arimidex    Parkinsonian features 01/31/2020   Peripheral neuropathy 08/23/2018   Plantar fasciitis of right foot    Dr Oneta Rack   Poor balance 02/20/2018   S/P bilateral mastectomy 08/04/2015   Septic shock ([place]) 01/31/2020   Severe sepsis with acute organ dysfunction due to gram-negative bacteria ([place]) 01/31/2020   Transfusion history 1976   Type 2 diabetes mellitus with stage 3 chronic kidney disease, without long-term current use of  insulin ([place]) 08/11/2008   Ophth, Dr Kathrin Penner: no retinopathy  Diabetes maternal grandmother    Past Surgical History:  Procedure Laterality Date   ABDOMINAL HYSTERECTOMY  1976   with BSO due to infection from [place] IUD   [place]   @ [facility] & BSO   BREAST LUMPECTOMY     [id number], [phone]   CATARACT EXTRACTION, BILATERAL Bilateral 2018   COLONOSCOPY W/ POLYPECTOMY  2006   negative 2011; Dr Carlean Purl   MASTECTOMY W/ NODES PARTIAL  2012   double mastectomy with nodes taken out on right side    PLACEMENT OF BREAST IMPLANTS  04/2011   Dr Migdalia Dk, [place]      There were no vitals filed for this visit.   Subjective Assessment - 04/29/21 1235     Subjective Has been working on the exercises at home with her husband and they are going well.    Patient is accompained by: Family member   husband   Pertinent History PMH:  DM, HTN, Gilbert's syndrome, IBS, closed fracture pelvis, breast cancer; UTI, hx of sepsis    Patient Stated Goals Pt's goal for therapy is to stand up and walk around without the walker.  Don't want to fall again.  Also want to work on strength (husband) for getting out of chair.    Currently in Pain? No/denies                [facility] PT Assessment - 04/29/21 1235       Assessment   Medical Diagnosis Parkinson's disease    Referring Provider (PT) Metta Clines, DO    Onset Date/Surgical Date 02/25/21      Prior Function   Level of Independence Independent                           [facility] Adult PT Treatment/Exercise - 04/29/21 1235       Transfers   Transfers Sit to Stand;Stand to Sit    Sit to Stand 5: Supervision;With upper extremity assist;Without upper extremity assist    Stand to Sit 5: Supervision;With upper extremity assist;Without upper extremity assist    Number of Reps 2 sets   5 reps   Comments from 21" mat table working on performing without UE support, cues for incr  forward weight shift and controlled descent      Ambulation/Gait   Ambulation/Gait Yes    Ambulation/Gait Assistance 5: Supervision    Ambulation/Gait Assistance Details 2 laps (230') in approx. 2 minutes, cues for R foot clearance (esp during 2nd lap) and posture    Ambulation Distance (Feet) 230 Feet   x1   Assistive device Rollator    Gait Pattern Step-through pattern;Decreased step length - right;Decreased trunk rotation;Decreased hip/knee flexion - left;Shuffle;Festinating    Ambulation Surface Level;Indoor      [employer]   Sit to Stand Able to stand  independently using hands    Standing Unsupported Able to stand safely 2 minutes    Sitting with Back Unsupported but Feet Supported on Floor or Stool Able to sit safely and securely 2 minutes    Stand to Sit Sits independently, has uncontrolled descent    Transfers Able to transfer safely, definite need of hands    Standing Unsupported with Eyes Closed Able to stand 10 seconds safely    Standing Ubsupported with Feet Together Able to place feet together independently and stand 1 minute safely    From Standing, Reach Forward with Outstretched Arm Can reach confidently >25 cm (10")    From Standing Position, Pick up Object from Floor Able to pick up shoe, needs supervision    From Standing Position, Turn to Look Behind Over each Shoulder Looks behind from both sides and weight shifts well    Turn 360 Degrees Able to turn 360 degrees safely but slowly   ~10.5 seconds each direction   Standing Unsupported, Alternately Place Feet on Step/Stool Able to complete >2 steps/needs minimal assist    Standing Unsupported, One Foot in Front Able to plae foot ahead of the other independently and hold 30 seconds    Standing on One Leg Tries to lift leg/unable to hold 3 seconds but remains standing independently   standing on LLE   Total Score 41      Knee/Hip Exercises: Standing   Hip Flexion AROM;Stengthening;1 set;10 reps;Knee bent;Knee  straight    Hip Flexion Limitations x10 reps each with leg straight and leg bent, standing at locked rollator    Functional Squat 1 set    Functional Squat Limitations with bolster behind pt, mini squat to elevated bolster with controlled descent and then back up without UE support x10 reps                 Balance Exercises - 04/29/21 1306       Balance Exercises: Standing   SLS with Vectors Solid surface;Intermittent upper extremity assist    SLS with Vectors Limitations with 6" step 2 x 5 reps alternating toe taps, 2nd rep pt needing no UE support and performing with controlled pace with min guard for balance               PT Education - 04/29/21 1317     Education Details progress towards LTGs.    Person(s) Educated Patient    Methods Explanation    Comprehension Verbalized understanding              PT Short Term Goals - 03/31/21 0938       PT SHORT TERM GOAL #1   Title Pt will perform HEP with family supervision for improved strength, balance, transfers, and gait.  TARGET for all STGs 04/01/2021    Time 4    Period Weeks    Status Achieved      PT SHORT TERM GOAL #2   Title Pt will improve 5x sit<>stand to less than or equal to 15 sec to demonstrate improved functional strength and transfer efficiency.    Baseline 20.69 with UE support; 22 secs with better form/technique    Time 4    Period Weeks    Status Not Met      PT SHORT TERM GOAL #3   Title Pt will improve TUG score to less than or equal to 18 seconds for decreased fall risk.    Baseline 23.44 sec; 21.65 sec avg    Time  4    Period Weeks    Status Not Met      PT SHORT TERM GOAL #4   Title Berg Balance score to be assessed, wtih goal to be written as approrpiate.    Baseline 03/28/21:  34/56 initial    Time 4    Period Weeks    Status Achieved      PT SHORT TERM GOAL #5   Title Pt/husband will verbalize understanding of fall prevention in home environment.    Time 4    Period Weeks     Status Achieved               PT Long Term Goals - 04/29/21 1249       PT LONG TERM GOAL #1   Title Pt will perform HEP with supervision of family to address balance, gait, functional mobility.  TARGET all LTGs 04/29/2021    Baseline PT extensively reviewed last week with pt's husband present, pt consistently performing at home with husband    Time 8    Period Weeks    Status Achieved      PT LONG TERM GOAL #2   Title Pt will improve 5x sit<>stand to less than or equal to 12.5 sec to demonstrate improved functional strength and transfer efficiency.    Baseline 26.37 seconds on 04/27/21, pt demonstrating improved technique    Time 8    Period Weeks    Status Not Met      PT LONG TERM GOAL #3   Title Pt will improve TUG score to less than or equal to 15 seconds for decreased fall risk.    Baseline 19.53 on 04/27/21 with rollator    Time 8    Period Weeks    Status Not Met      PT LONG TERM GOAL #4   Title Berg score to imporve by 5 points from baseline for decreased fall rsik.    Baseline 34/56 initial; 41/56 on 04/29/21    Time 8    Period Weeks    Status New      PT LONG TERM GOAL #5   Title Pt will improve gait velocity to at least 2.62 ft/sec for improved gait efficiency and safety.    Baseline 2.07 ft/sec; 18.35 seconds = 1.78 ft/sec on 04/27/21, pt focusing on more deliberate pace    Time 8    Period Weeks    Status Not Met             Revised LTGs for re-cert:  PT Long Term Goals - 04/29/21 1328       PT LONG TERM GOAL #1   Title Pt will be independent with final HEP with husband's supervision and walking program in order to build upon functional gains made in therapy. TARGET all LTGs 05/27/21    Baseline will benefit from further review/updates as appropriate before D/C    Time 4    Period Weeks    Status On-going    Target Date 05/27/21      PT LONG TERM GOAL #2   Title Pt will perform 7 sit <> stands in 30 seconds with BUE support from standard  height chair in order to demo improved transfer efficiency.    Baseline 6 sit <> stands    Time 4    Period Weeks    Status Not Met      PT LONG TERM GOAL #3   Title Pt will improve TUG score to  less than or equal to 18 seconds for decreased fall risk.    Baseline 19.53 on 04/27/21 with rollator    Time 4    Period Weeks    Status Revised      PT LONG TERM GOAL #4   Title --    Baseline --    Time --    Period --    Status --      PT LONG TERM GOAL #5   Title --    Baseline --    Time --    Period --    Status --                  Plan - 04/29/21 1319     Clinical Impression Statement Today's skilled session focused on checking pt's remaining LTGs. Pt met LTGs #1 and #4 today. Pt is consistently performing her HEP at home with her husband (did not review today as was reviewed extensively last week with pt's husband present). Pt has slowed measures on gait speed and 5x sit <> stand due to improved technique and pt focusing on R foot clearance during gait. Pt improved BERG score today to a 41/56 (previously was a 34/56). Remainder of session continued to focus on functional strengthening and sit <> stands working on an elevated surface with controlled descent and no UE support. Will re-cert for an additional 4 visits to finalize HEP/walking program, continue to work on functional strengthening, transfers, gait and to allow pt to transition of out PT.  Pt and pt's husband in agreement with plan. LTGs revised/updated as appropriate.    Personal Factors and Comorbidities Comorbidity 3+    Comorbidities PMH:  DM, HTN, Gilbert's syndrome, IBS, closed fracture pelvis, breast cancer, hx of sepsis    Examination-Activity Limitations Locomotion Level;Transfers;Stand;Stairs    Examination-Participation Restrictions Community Activity;Meal Prep    Stability/Clinical Decision Making Evolving/Moderate complexity    Rehab Potential Good    PT Frequency 2x / week   1-2x a week   PT  Duration 4 weeks    PT Treatment/Interventions ADLs/Self Care Home Management;Gait training;Stair training;Functional mobility training;Therapeutic activities;Therapeutic exercise;Balance training;Patient/family education;Neuromuscular re-education    PT Next Visit Plan finalizing HEP and walking program for D/C at end of remaining 4 visits.  Continue to work on hip/ankle/step strategy for balance-work on more variable perturbations, turns/figure 8 with gait.  Foot clearance for RLE with gait.  Work towards [employer]. Incorporate functional strengthening    Consulted and Agree with Plan of Care Patient;Family member/caregiver             Patient will benefit from skilled therapeutic intervention in order to improve the following deficits and impairments:  Abnormal gait, Difficulty walking, Decreased balance, Decreased mobility, Decreased strength, Postural dysfunction  Visit Diagnosis: Other abnormalities of gait and mobility  Unsteadiness on feet  Other symptoms and signs involving the nervous system     Problem List Patient Active Problem List   Diagnosis Date Noted   Head injury, acute, initial encounter 02/12/2021   UTI (urinary tract infection) 01/17/2021   Aortic atherosclerosis ([place]) 08/27/2020   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   Parkinson disease ([place]) 08/26/2020   Paroxysmal atrial fibrillation ([place]) 97/98/9211   Complicated UTI (urinary tract infection) 01/31/2020   CKD (chronic kidney disease) stage 3, GFR 30-59 ml/min ([place]) 01/31/2020   Hypokalemia due to inadequate potassium intake 01/31/2020   Severe sepsis with acute organ dysfunction due to Gram negative bacteria ([place]) 01/31/2020   Episodic lightheadedness  08/27/2019   Dizziness 02/24/2019   Peripheral neuropathy 08/23/2018   Poor balance 02/20/2018   Genetic testing 09/04/2017   Family history of breast cancer    Family history of ovarian cancer    Fatty liver 08/05/2015   History of breast cancer 08/04/2015    S/P bilateral mastectomy 08/04/2015   Osteoarthritis of left knee 08/04/2015   Elevated LFTs 08/10/2014   Osteopenia 08/31/2013   Transfusion history 08/18/2013   IBS (irritable bowel syndrome) 08/18/2013   Cancer of central portion of right female breast ([place]) 09/04/2010   Joint pain 09/02/2010   History of colonic polyps 06/06/2010   Essential hypertension 02/16/2009   Type 2 diabetes mellitus with stage 3 chronic kidney disease, without long-term current use of insulin ([place]) 08/11/2008   Hypertriglyceridemia 09/30/2007   GILBERT'S SYNDROME 02/06/2007    Arliss Journey, PT, DPT  04/29/2021, 1:26 PM  [facility] [address] [place], [place], [address] Phone: [phone]   Fax:  [phone]  Name: [name] MRN: [id number] Date of Birth: 1941-07-04

## 2021-05-05 ENCOUNTER — Encounter: Payer: Self-pay | Admitting: Physical Therapy

## 2021-05-05 ENCOUNTER — Other Ambulatory Visit: Payer: Self-pay

## 2021-05-05 ENCOUNTER — Ambulatory Visit: Payer: Medicare Other | Admitting: Physical Therapy

## 2021-05-05 DIAGNOSIS — M6281 Muscle weakness (generalized): Secondary | ICD-10-CM | POA: Diagnosis not present

## 2021-05-05 DIAGNOSIS — R29818 Other symptoms and signs involving the nervous system: Secondary | ICD-10-CM | POA: Diagnosis not present

## 2021-05-05 DIAGNOSIS — R2689 Other abnormalities of gait and mobility: Secondary | ICD-10-CM

## 2021-05-05 DIAGNOSIS — R2681 Unsteadiness on feet: Secondary | ICD-10-CM

## 2021-05-05 NOTE — Therapy (Signed)
[facility] [address] [place], [place], [address] Phone: [phone]   Fax:  [phone]  Physical Therapy Treatment  Patient Details  Name: [name] MRN: [id number] Date of Birth: [birth year] Referring Provider (PT): Metta Clines, DO   Encounter Date: 05/05/2021   PT End of Session - 05/05/21 1235     Visit Number 17    Number of Visits 20    Date for PT Re-Evaluation 75/17/00   per re-cert on 1/74/94   Authorization Type [place] has been seen by [facility] this year; will likely need KX (maybe not-private Medicare?)    Progress Note Due on Visit 10    PT Start Time 4967    PT Stop Time 1313    PT Time Calculation (min) 38 min    Equipment Utilized During Treatment Gait belt    Activity Tolerance Patient tolerated treatment well    Behavior During Therapy [facility] for tasks assessed/performed             Past Medical History:  Diagnosis Date   Acute renal failure superimposed on stage 3 chronic kidney disease ([place]) 01/31/2020   Breast cancer ([place]) 2012   Cancer of central portion of right female breast ([place]) 09/04/2010   Patient has a long history of fibrocystic disease. 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 She underwent right total mastectomy with sentinel node biopsy and left total (prophylactic) mastectomy on 10/12/2010. She underwent immediate reconstruction with expander and AlloDerm placement. 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 One lymph   Complicated UTI (urinary tract infection) 01/31/2020   Diabetes mellitus    Dizziness 02/24/2019   Elevated LFTs 08/10/2014   Episodic lightheadedness 08/27/2019   Esophageal reflux 05/06/2008   Essential hypertension 02/16/2009   Family history of breast cancer    Family history of ovarian cancer    Fatigue 02/24/2019   Fatty liver 08/05/2015   Genetic testing 09/04/2017   Negative  genetic testing on the common hereditary cancer panel.  The Hereditary Gene Panel offered by [facility] includes sequencing and/or deletion duplication testing of the following 47 genes: APC, ATM, AXIN2, BARD1, BMPR1A, BRCA1, BRCA2, BRIP1, CDH1, CDK4, CDKN2A (p14ARF), CDKN2A (p16INK4a), CHEK2, CTNNA1, DICER1, EPCAM (Deletion/duplication testing only), GREM1 (promoter region deletion/duplicat   GERD (gastroesophageal reflux disease)    [name] syndrome    GILBERT'S SYNDROME 02/06/2007   Qualifier: Diagnosis of  By: Janelle Floor     History of breast cancer 08/04/2015   S/p b/l mastectomy   History of colonic polyps 06/06/2010   Annotation: destroyed, no clear adenomatous proven Qualifier: Diagnosis of  By: Carlean Purl MD, Tonna Boehringer E  2008 polyp 2013 neg     Hypertension    Hypertriglyceridemia 09/30/2007   statin intolerant  Father MI @ 1 Sister CVA > 65    Hypokalemia due to inadequate potassium intake 01/31/2020   IBS (irritable bowel syndrome)    Joint pain 09/02/2010   Lactic acidosis 01/31/2020   Osteoarthritis of left knee 08/04/2015   Significant arthritis in knees, limits her activity    Osteopenia 08/31/2013   [facility]  DEXA 03/20/2018: Osteopenia:  LFN -1.3, R--1.0, spine -0.6-statistically significant increase in BMD in all areas  Findings 08/22/13 : lowest T score -  1.3  @  R femoral neck (hip) ,3.4  %loss @ R femur &   4.1 % in spine   since  2012 Dexa 01/05/16: lowest  -  L femur neck T -1.6 Diagnosis: mild Osteopenia Rx: Fosamax remotely; now on Arimidex    Parkinsonian features 01/31/2020   Peripheral neuropathy 08/23/2018   Plantar fasciitis of right foot    Dr Oneta Rack   Poor balance 02/20/2018   S/P bilateral mastectomy 08/04/2015   Septic shock ([place]) 01/31/2020   Severe sepsis with acute organ dysfunction due to gram-negative bacteria ([place]) 01/31/2020   Transfusion history 1976   Type 2 diabetes mellitus with stage 3 chronic kidney disease, without long-term current use of insulin ([place])  08/11/2008   Ophth, Dr Kathrin Penner: no retinopathy  Diabetes maternal grandmother    Past Surgical History:  Procedure Laterality Date   ABDOMINAL HYSTERECTOMY  1976   with BSO due to infection from [place] IUD   [facility]   @ [facility] & BSO   BREAST LUMPECTOMY     [id number], [phone]   CATARACT EXTRACTION, BILATERAL Bilateral 2018   COLONOSCOPY W/ POLYPECTOMY  2006   negative 2011; Dr Carlean Purl   MASTECTOMY W/ NODES PARTIAL  2012   double mastectomy with nodes taken out on right side    PLACEMENT OF BREAST IMPLANTS  04/2011   Dr Migdalia Dk, [place]      There were no vitals filed for this visit.   Subjective Assessment - 05/05/21 1236     Subjective Been doing well.  Walking more everyday, with the ramp fixed to go outside.  No falls.    Patient is accompained by: Family member   husband   Pertinent History PMH:  DM, HTN, Gilbert's syndrome, IBS, closed fracture pelvis, breast cancer; UTI, hx of sepsis    Patient Stated Goals Pt's goal for therapy is to stand up and walk around without the walker.  Don't want to fall again.  Also want to work on strength (husband) for getting out of chair.    Currently in Pain? No/denies                               [facility] Adult PT Treatment/Exercise - 05/05/21 0001       Transfers   Transfers Sit to Stand;Stand to Sit    Sit to Stand 5: Supervision;With upper extremity assist;Without upper extremity assist    Sit to Stand Details Verbal cues for technique;Verbal cues for sequencing    Stand to Sit 5: Supervision;With upper extremity assist;Without upper extremity assist    Stand to Sit Details (indicate cue type and reason) Verbal cues for sequencing;Verbal cues for technique    Number of Reps 10 reps;1 set    Comments from 24" mat table      Ambulation/Gait   Ambulation/Gait Yes    Ambulation/Gait Assistance 5: Supervision    Ambulation/Gait Assistance Details Cues  for R foot clearance    Ambulation Distance (Feet) 230 Feet   60 ft, then 250 ft   Assistive device Rollator    Gait Pattern Step-through pattern;Decreased step length - right;Decreased trunk rotation;Decreased hip/knee flexion - left;Shuffle;Poor foot clearance - right    Ambulation Surface Level;Indoor    Gait Comments 20 ft of gait with resistance added to RLE using rollator, for improved RLE foot clearance, step length.      Knee/Hip Exercises: Standing   Hip Flexion AROM;Stengthening;10 reps;Knee bent;Knee straight;2 sets    Hip Flexion Limitations 2 sets x10 reps each with leg straight and leg bent, standing at locked rollator.  In  parallel bars, additional 10 reps of hip/knee flexion resisted with red theraband                 Balance Exercises - 05/05/21 0001       Balance Exercises: Standing   SLS with Vectors Solid surface;Intermittent upper extremity assist    SLS with Vectors Limitations with 6" step x 10 reps alternating toe taps, 2 sets-cues to use mirror for visual cues    Stepping Strategy Anterior;UE support;10 reps   Stepping over 2" obstacle   Retro Gait Upper extremity support;Limitations;5 reps    Retro Gait Limitations Forward gait iwth resistance at RLE for resisted hip/knee flexion to increase ampltiude/intensity of RLE for better foot clearance, performed forward/back red theraband resistance x 3 reps, then no resistance x 2 reps.                 PT Short Term Goals - 04/29/21 1328       PT SHORT TERM GOAL #1   Title ALL STGS = LTGS               PT Long Term Goals - 04/29/21 1328       PT LONG TERM GOAL #1   Title Pt will be independent with final HEP with husband's supervision and walking program in order to build upon functional gains made in therapy. TARGET all LTGs 05/27/21    Baseline will benefit from further review/updates as appropriate before D/C    Time 4    Period Weeks    Status On-going    Target Date 05/27/21      PT  LONG TERM GOAL #2   Title Pt will perform 7 sit <> stands in 30 seconds with BUE support from standard height chair in order to demo improved transfer efficiency.    Baseline 6 sit <> stands    Time 4    Period Weeks    Status Not Met      PT LONG TERM GOAL #3   Title Pt will improve TUG score to less than or equal to 18 seconds for decreased fall risk.    Baseline 19.53 on 04/27/21 with rollator    Time 4    Period Weeks    Status Revised      PT LONG TERM GOAL #4   Title --    Baseline --    Time --    Period --    Status --      PT LONG TERM GOAL #5   Title --    Baseline --    Time --    Period --    Status --                   Plan - 05/05/21 1521     Clinical Impression Statement Skilled PT session today focused on larger amplitude movement patterns for RLE for improved foot clearance and step length.  She responds well to use of red theraband on RLE for resistance with gait in parallel bars, to facilitated increased foot clearance/step length.  She is able to maintain this after resistance removed, with cueing provided to work as if she still has the resistance on RLE.  She will continue to benefit from skilled PT to further address balance, gait for improved overall functional mobility.    Personal Factors and Comorbidities Comorbidity 3+    Comorbidities PMH:  DM, HTN, Gilbert's syndrome, IBS, closed fracture pelvis, breast cancer, hx of  sepsis    Examination-Activity Limitations Locomotion Level;Transfers;Stand;Stairs    Examination-Participation Restrictions Community Activity;Meal Prep    Stability/Clinical Decision Making Evolving/Moderate complexity    Rehab Potential Good    PT Frequency 2x / week   1-2x a week   PT Duration 4 weeks    PT Treatment/Interventions ADLs/Self Care Home Management;Gait training;Stair training;Functional mobility training;Therapeutic activities;Therapeutic exercise;Balance training;Patient/family education;Neuromuscular  re-education    PT Next Visit Plan finalizing HEP  (consider adding hip flexion, hip/knee flexion to HEP) and walking program for D/C at end of remaining 4 visits.  Continue to work on hip/ankle/step strategy for balance-work on more variable perturbations, turns/figure 8 with gait.  Foot clearance for RLE with gait.  Work towards [employer]. Incorporate functional strengthening    Consulted and Agree with Plan of Care Patient             Patient will benefit from skilled therapeutic intervention in order to improve the following deficits and impairments:  Abnormal gait, Difficulty walking, Decreased balance, Decreased mobility, Decreased strength, Postural dysfunction  Visit Diagnosis: Other abnormalities of gait and mobility  Unsteadiness on feet  Muscle weakness (generalized)     Problem List Patient Active Problem List   Diagnosis Date Noted   Head injury, acute, initial encounter 02/12/2021   UTI (urinary tract infection) 01/17/2021   Aortic atherosclerosis ([place]) 08/27/2020   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   Parkinson disease ([place]) 08/26/2020   Paroxysmal atrial fibrillation ([place]) 46/96/2952   Complicated UTI (urinary tract infection) 01/31/2020   CKD (chronic kidney disease) stage 3, GFR 30-59 ml/min ([facility]) 01/31/2020   Hypokalemia due to inadequate potassium intake 01/31/2020   Severe sepsis with acute organ dysfunction due to Gram negative bacteria ([place]) 01/31/2020   Episodic lightheadedness 08/27/2019   Dizziness 02/24/2019   Peripheral neuropathy 08/23/2018   Poor balance 02/20/2018   Genetic testing 09/04/2017   Family history of breast cancer    Family history of ovarian cancer    Fatty liver 08/05/2015   History of breast cancer 08/04/2015   S/P bilateral mastectomy 08/04/2015   Osteoarthritis of left knee 08/04/2015   Elevated LFTs 08/10/2014   Osteopenia 08/31/2013   Transfusion history 08/18/2013   IBS (irritable bowel syndrome) 08/18/2013   Cancer of  central portion of right female breast ([place]) 09/04/2010   Joint pain 09/02/2010   History of colonic polyps 06/06/2010   Essential hypertension 02/16/2009   Type 2 diabetes mellitus with stage 3 chronic kidney disease, without long-term current use of insulin ([place]) 08/11/2008   Hypertriglyceridemia 09/30/2007   GILBERT'S SYNDROME 02/06/2007    Shaira Sova W. 05/05/2021, 3:25 PM Frazier Butt., PT   [facility] [address] [place], [place], [address] Phone: [phone]   Fax:  [phone]  Name: TIFFENY MINCHEW MRN: [id number] Date of Birth: 1941/08/23

## 2021-05-09 ENCOUNTER — Other Ambulatory Visit: Payer: Self-pay

## 2021-05-09 ENCOUNTER — Ambulatory Visit: Payer: Medicare Other | Admitting: Physical Therapy

## 2021-05-09 DIAGNOSIS — R2689 Other abnormalities of gait and mobility: Secondary | ICD-10-CM

## 2021-05-09 DIAGNOSIS — M6281 Muscle weakness (generalized): Secondary | ICD-10-CM

## 2021-05-09 DIAGNOSIS — R2681 Unsteadiness on feet: Secondary | ICD-10-CM

## 2021-05-09 DIAGNOSIS — R29818 Other symptoms and signs involving the nervous system: Secondary | ICD-10-CM | POA: Diagnosis not present

## 2021-05-09 NOTE — Patient Instructions (Addendum)
Access Code: [email or web address] URL: [email or web address] Date: 05/09/2021 Prepared by: Mady Haagensen  Exercises Side to side weightshift - 1 x daily - 5 x weekly - 1-2 sets - 10 reps Heel Toe Raises with Counter Support - 1 x daily - 5 x weekly - 1-2 sets - 10 reps Staggered Stance Forward Backward Weight Shift with Counter Support - 1 x daily - 5 x weekly - 1-2 sets - 10 reps Standing posture stretch - 1 x daily - 5 x weekly - 1-2 sets - 10 reps Side Stepping with Counter Support - 1 x daily - 5 x weekly - 1-2 sets - 10 reps Alternating Step Backward with Support - 1 x daily - 5 x weekly - 1-2 sets - 10 reps  Added 05/09/21 Standing Marching - 1 x daily - 5 x weekly - 1-2 sets - 10 reps Standing Hip Flexion AROM - 1 x daily - 5 x weekly - 1-2 sets - 10 reps Sit to Stand with Armchair - 1 x daily - 7 x weekly - 3 sets - 10 reps

## 2021-05-09 NOTE — Therapy (Signed)
[facility] [address] [place], [place], [address] Phone: [phone]   Fax:  [phone]  Physical Therapy Treatment  Patient Details  Name: [name] MRN: [id number] Date of Birth: [birth year] Referring Provider (PT): Metta Clines, DO   Encounter Date: 05/09/2021   PT End of Session - 05/09/21 1152     Visit Number 18    Number of Visits 20    Date for PT Re-Evaluation 91/79/15   per re-cert on 0/56/97   Authorization Type [place] has been seen by [facility] this year; will likely need KX (maybe not-private Medicare?)    Progress Note Due on Visit 10    PT Start Time 1105    PT Stop Time 1144    PT Time Calculation (min) 39 min    Equipment Utilized During Treatment Gait belt    Activity Tolerance Patient tolerated treatment well    Behavior During Therapy [facility] for tasks assessed/performed             Past Medical History:  Diagnosis Date   Acute renal failure superimposed on stage 3 chronic kidney disease ([place]) 01/31/2020   Breast cancer ([place]) 2012   Cancer of central portion of right female breast ([place]) 09/04/2010   Patient has a long history of fibrocystic disease. 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 She underwent right total mastectomy with sentinel node biopsy and left total (prophylactic) mastectomy on 10/12/2010. She underwent immediate reconstruction with expander and AlloDerm placement. 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 One lymph   Complicated UTI (urinary tract infection) 01/31/2020   Diabetes mellitus    Dizziness 02/24/2019   Elevated LFTs 08/10/2014   Episodic lightheadedness 08/27/2019   Esophageal reflux 05/06/2008   Essential hypertension 02/16/2009   Family history of breast cancer    Family history of ovarian cancer    Fatigue 02/24/2019   Fatty liver 08/05/2015   Genetic testing 09/04/2017   Negative  genetic testing on the common hereditary cancer panel.  The Hereditary Gene Panel offered by [facility] includes sequencing and/or deletion duplication testing of the following 47 genes: APC, ATM, AXIN2, BARD1, BMPR1A, BRCA1, BRCA2, BRIP1, CDH1, CDK4, CDKN2A (p14ARF), CDKN2A (p16INK4a), CHEK2, CTNNA1, DICER1, EPCAM (Deletion/duplication testing only), GREM1 (promoter region deletion/duplicat   GERD (gastroesophageal reflux disease)    [name] syndrome    GILBERT'S SYNDROME 02/06/2007   Qualifier: Diagnosis of  By: Janelle Floor     History of breast cancer 08/04/2015   S/p b/l mastectomy   History of colonic polyps 06/06/2010   Annotation: destroyed, no clear adenomatous proven Qualifier: Diagnosis of  By: Carlean Purl MD, Tonna Boehringer E  2008 polyp 2013 neg     Hypertension    Hypertriglyceridemia 09/30/2007   statin intolerant  Father MI @ 8 Sister CVA > 65    Hypokalemia due to inadequate potassium intake 01/31/2020   IBS (irritable bowel syndrome)    Joint pain 09/02/2010   Lactic acidosis 01/31/2020   Osteoarthritis of left knee 08/04/2015   Significant arthritis in knees, limits her activity    Osteopenia 08/31/2013   [facility]  DEXA 03/20/2018: Osteopenia:  LFN -1.3, R--1.0, spine -0.6-statistically significant increase in BMD in all areas  Findings 08/22/13 : lowest T score -  1.3  @  R femoral neck (hip) ,3.4  %loss @ R femur &   4.1 % in spine   since  2012 Dexa 01/05/16: lowest  -  L femur neck T -1.6 Diagnosis: mild Osteopenia Rx: Fosamax remotely; now on Arimidex    Parkinsonian features 01/31/2020   Peripheral neuropathy 08/23/2018   Plantar fasciitis of right foot    Dr Oneta Rack   Poor balance 02/20/2018   S/P bilateral mastectomy 08/04/2015   Septic shock ([place]) 01/31/2020   Severe sepsis with acute organ dysfunction due to gram-negative bacteria ([place]) 01/31/2020   Transfusion history 1976   Type 2 diabetes mellitus with stage 3 chronic kidney disease, without long-term current use of insulin ([place])  08/11/2008   Ophth, Dr Kathrin Penner: no retinopathy  Diabetes maternal grandmother    Past Surgical History:  Procedure Laterality Date   ABDOMINAL HYSTERECTOMY  1976   with BSO due to infection from [place] IUD   [place]   @ [facility] & BSO   BREAST LUMPECTOMY     [id number], [phone]   CATARACT EXTRACTION, BILATERAL Bilateral 2018   COLONOSCOPY W/ POLYPECTOMY  2006   negative 2011; Dr Carlean Purl   MASTECTOMY W/ NODES PARTIAL  2012   double mastectomy with nodes taken out on right side    PLACEMENT OF BREAST IMPLANTS  04/2011   Dr Migdalia Dk, [place]      There were no vitals filed for this visit.   Subjective Assessment - 05/09/21 1107     Subjective No changes, no falls over the weekend.    Patient is accompained by: Family member   husband   Pertinent History PMH:  DM, HTN, Gilbert's syndrome, IBS, closed fracture pelvis, breast cancer; UTI, hx of sepsis    Patient Stated Goals Pt's goal for therapy is to stand up and walk around without the walker.  Don't want to fall again.  Also want to work on strength (husband) for getting out of chair.    Currently in Pain? No/denies                               [facility] Adult PT Treatment/Exercise - 05/09/21 0001       Transfers   Transfers Sit to Stand;Stand to Sit    Sit to Stand 5: Supervision    Sit to Stand Details Verbal cues for technique;Verbal cues for sequencing    Sit to Stand Details (indicate cue type and reason) Cues for increased forward lean initially, use of UEs to boost away from chair.    Stand to Sit 5: Supervision;With upper extremity assist;Without upper extremity assist    Stand to Sit Details (indicate cue type and reason) Verbal cues for sequencing;Verbal cues for technique    Comments from 24" mat table and from chair, at least 5-8 reps each.      Ambulation/Gait   Ambulation/Gait Yes    Ambulation/Gait Assistance 5: Supervision     Ambulation/Gait Assistance Details Cues for big movement pattern for R foot clearance, initially, and then after about 150 ft    Ambulation Distance (Feet) 300 Feet   230   Assistive device Rollator    Gait Pattern Step-through pattern;Decreased step length - right;Decreased trunk rotation;Decreased hip/knee flexion - left;Shuffle;Poor foot clearance - right    Ambulation Surface Level;Indoor      Therapeutic Activites    Therapeutic Activities Other Therapeutic Activities    Other Therapeutic Activities Sitting edge of mat, forward lean, reaching to edge of chair in front, x 5 reps, to increase forward excursion/weight through BLEs.  Progressed to forward  lean, weight equally through BLEs, lifting buttocks from mat, but staying in knee flexed position, x 5 reps.  Pt c/o increased pain in bilat knees.      Knee/Hip Exercises: Standing   Hip Flexion AROM;Stengthening;10 reps;Knee bent;Knee straight;2 sets    Hip Flexion Limitations Performed initially today at counter; additional reps performed with pictures added to HEP and husband present.                    PT Education - 05/09/21 1151     Education provided Yes    Education Details Additions to HEP-see instructions    Person(s) Educated Patient    Methods Explanation    Comprehension Verbalized understanding              PT Short Term Goals - 04/29/21 1328       PT SHORT TERM GOAL #1   Title ALL STGS = LTGS               PT Long Term Goals - 04/29/21 1328       PT LONG TERM GOAL #1   Title Pt will be independent with final HEP with husband's supervision and walking program in order to build upon functional gains made in therapy. TARGET all LTGs 05/27/21    Baseline will benefit from further review/updates as appropriate before D/C    Time 4    Period Weeks    Status On-going    Target Date 05/27/21      PT LONG TERM GOAL #2   Title Pt will perform 7 sit <> stands in 30 seconds with BUE support from  standard height chair in order to demo improved transfer efficiency.    Baseline 6 sit <> stands    Time 4    Period Weeks    Status Not Met      PT LONG TERM GOAL #3   Title Pt will improve TUG score to less than or equal to 18 seconds for decreased fall risk.    Baseline 19.53 on 04/27/21 with rollator    Time 4    Period Weeks    Status Revised      PT LONG TERM GOAL #4   Title --    Baseline --    Time --    Period --    Status --      PT LONG TERM GOAL #5   Title --    Baseline --    Time --    Period --    Status --                   Plan - 05/09/21 1154     Clinical Impression Statement Continued focus on large movement patterns for RLE for improved foot clearance and step length.  Also reviewed and practiced sit<>stand transfers, as pt reports she doesn't always perform this correctly.  Added to HEP to address this.  Pt will continue to benefit from next 2 scheduled visits to review HEP and educate husband in cueing for continue safety with gait and transfers.    Personal Factors and Comorbidities Comorbidity 3+    Comorbidities PMH:  DM, HTN, Gilbert's syndrome, IBS, closed fracture pelvis, breast cancer, hx of sepsis    Examination-Activity Limitations Locomotion Level;Transfers;Stand;Stairs    Examination-Participation Restrictions Community Activity;Meal Prep    Stability/Clinical Decision Making Evolving/Moderate complexity    Rehab Potential Good    PT Frequency 2x / week  1-2x a week   PT Duration 4 weeks    PT Treatment/Interventions ADLs/Self Care Home Management;Gait training;Stair training;Functional mobility training;Therapeutic activities;Therapeutic exercise;Balance training;Patient/family education;Neuromuscular re-education    PT Next Visit Plan Review HEP additions and walking program for D/C at end of remaining visits.  Continue to work on hip/ankle/step strategy for balance-work on more variable perturbations, turns/figure 8 with gait.   Foot clearance for RLE with gait.  Work towards [employer]. Incorporate functional strengthening    Consulted and Agree with Plan of Care Patient             Patient will benefit from skilled therapeutic intervention in order to improve the following deficits and impairments:  Abnormal gait, Difficulty walking, Decreased balance, Decreased mobility, Decreased strength, Postural dysfunction  Visit Diagnosis: Unsteadiness on feet  Other abnormalities of gait and mobility  Muscle weakness (generalized)     Problem List Patient Active Problem List   Diagnosis Date Noted   Head injury, acute, initial encounter 02/12/2021   UTI (urinary tract infection) 01/17/2021   Aortic atherosclerosis ([place]) 08/27/2020   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   Parkinson disease ([place]) 08/26/2020   Paroxysmal atrial fibrillation ([place]) 85/90/9311   Complicated UTI (urinary tract infection) 01/31/2020   CKD (chronic kidney disease) stage 3, GFR 30-59 ml/min ([facility]) 01/31/2020   Hypokalemia due to inadequate potassium intake 01/31/2020   Severe sepsis with acute organ dysfunction due to Gram negative bacteria ([place]) 01/31/2020   Episodic lightheadedness 08/27/2019   Dizziness 02/24/2019   Peripheral neuropathy 08/23/2018   Poor balance 02/20/2018   Genetic testing 09/04/2017   Family history of breast cancer    Family history of ovarian cancer    Fatty liver 08/05/2015   History of breast cancer 08/04/2015   S/P bilateral mastectomy 08/04/2015   Osteoarthritis of left knee 08/04/2015   Elevated LFTs 08/10/2014   Osteopenia 08/31/2013   Transfusion history 08/18/2013   IBS (irritable bowel syndrome) 08/18/2013   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 05/09/2021, 11:57 AM Frazier Butt., PT   [facility] [address] [place], [place], [address] Phone: [phone]   Fax:  [phone]  Name: JAZZLENE HUOT MRN: [id number] Date of Birth: [birth year]

## 2021-05-11 ENCOUNTER — Telehealth: Payer: Self-pay | Admitting: Internal Medicine

## 2021-05-11 NOTE — Telephone Encounter (Signed)
Pt's spouse called   Stated the pt is feeling very fatigue and is running a temp of 100 right now. He is concerned that she has another UTI. Requesting a order to sent in. Advised that she will probably need an OV but he said that she has a history of UTI's and did not see the need to come in.   Please advise.   [place], [place] Phone:  [phone]  Fax:  [phone]

## 2021-05-12 ENCOUNTER — Ambulatory Visit: Payer: Medicare Other | Admitting: Physical Therapy

## 2021-05-12 ENCOUNTER — Other Ambulatory Visit: Payer: Medicare Other

## 2021-05-12 ENCOUNTER — Other Ambulatory Visit: Payer: Self-pay

## 2021-05-12 ENCOUNTER — Encounter: Payer: Self-pay | Admitting: Physical Therapy

## 2021-05-12 DIAGNOSIS — L918 Other hypertrophic disorders of the skin: Secondary | ICD-10-CM | POA: Diagnosis not present

## 2021-05-12 DIAGNOSIS — D2239 Melanocytic nevi of other parts of face: Secondary | ICD-10-CM | POA: Diagnosis not present

## 2021-05-12 DIAGNOSIS — R2681 Unsteadiness on feet: Secondary | ICD-10-CM

## 2021-05-12 DIAGNOSIS — M6281 Muscle weakness (generalized): Secondary | ICD-10-CM | POA: Diagnosis not present

## 2021-05-12 DIAGNOSIS — R29818 Other symptoms and signs involving the nervous system: Secondary | ICD-10-CM | POA: Diagnosis not present

## 2021-05-12 DIAGNOSIS — D2262 Melanocytic nevi of left upper limb, including shoulder: Secondary | ICD-10-CM | POA: Diagnosis not present

## 2021-05-12 DIAGNOSIS — D2371 Other benign neoplasm of skin of right lower limb, including hip: Secondary | ICD-10-CM | POA: Diagnosis not present

## 2021-05-12 DIAGNOSIS — R2689 Other abnormalities of gait and mobility: Secondary | ICD-10-CM | POA: Diagnosis not present

## 2021-05-12 DIAGNOSIS — L570 Actinic keratosis: Secondary | ICD-10-CM | POA: Diagnosis not present

## 2021-05-12 DIAGNOSIS — D1801 Hemangioma of skin and subcutaneous tissue: Secondary | ICD-10-CM | POA: Diagnosis not present

## 2021-05-12 DIAGNOSIS — D225 Melanocytic nevi of trunk: Secondary | ICD-10-CM | POA: Diagnosis not present

## 2021-05-12 DIAGNOSIS — R300 Dysuria: Secondary | ICD-10-CM

## 2021-05-12 DIAGNOSIS — L821 Other seborrheic keratosis: Secondary | ICD-10-CM | POA: Diagnosis not present

## 2021-05-12 DIAGNOSIS — L218 Other seborrheic dermatitis: Secondary | ICD-10-CM | POA: Diagnosis not present

## 2021-05-12 LAB — URINALYSIS, ROUTINE W REFLEX MICROSCOPIC
Bilirubin Urine: NEGATIVE
Hgb urine dipstick: NEGATIVE
Ketones, ur: NEGATIVE
Nitrite: NEGATIVE
Specific Gravity, Urine: <=1.005 — AB (ref 1.000–1.030)
Total Protein, Urine: NEGATIVE
Urine Glucose: NEGATIVE
Urobilinogen, UA: 0.2 (ref 0.0–1.0)
pH: 6 (ref 5.0–8.0)

## 2021-05-12 NOTE — Therapy (Signed)
[facility] [address] [place], [place], [address] Phone: [phone]   Fax:  [phone]  Physical Therapy Treatment  Patient Details  Name: [name] MRN: [id number] Date of Birth: August 23, 1941 Referring Provider (PT): Metta Clines, DO   Encounter Date: 05/12/2021   PT End of Session - 05/12/21 1251     Visit Number 19    Number of Visits 20    Date for PT Re-Evaluation 62/69/48   per re-cert on 5/46/27   Authorization Type [place] has been seen by [facility] this year; will likely need KX (maybe not-private Medicare?)    Progress Note Due on Visit 10    PT Start Time 1231    PT Stop Time 1314    PT Time Calculation (min) 43 min    Equipment Utilized During Treatment Gait belt    Activity Tolerance Patient tolerated treatment well    Behavior During Therapy [facility] for tasks assessed/performed             Past Medical History:  Diagnosis Date   Acute renal failure superimposed on stage 3 chronic kidney disease ([place]) 01/31/2020   Breast cancer ([place]) 2012   Cancer of central portion of right female breast ([place]) 09/04/2010   Patient has a long history of fibrocystic disease. 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 She underwent right total mastectomy with sentinel node biopsy and left total (prophylactic) mastectomy on 10/12/2010. She underwent immediate reconstruction with expander and AlloDerm placement. 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 One lymph   Complicated UTI (urinary tract infection) 01/31/2020   Diabetes mellitus    Dizziness 02/24/2019   Elevated LFTs 08/10/2014   Episodic lightheadedness 08/27/2019   Esophageal reflux 05/06/2008   Essential hypertension 02/16/2009   Family history of breast cancer    Family history of ovarian cancer    Fatigue 02/24/2019   Fatty liver 08/05/2015   Genetic testing 09/04/2017   Negative  genetic testing on the common hereditary cancer panel.  The Hereditary Gene Panel offered by [facility] includes sequencing and/or deletion duplication testing of the following 47 genes: APC, ATM, AXIN2, BARD1, BMPR1A, BRCA1, BRCA2, BRIP1, CDH1, CDK4, CDKN2A (p14ARF), CDKN2A (p16INK4a), CHEK2, CTNNA1, DICER1, EPCAM (Deletion/duplication testing only), GREM1 (promoter region deletion/duplicat   GERD (gastroesophageal reflux disease)    [name] syndrome    GILBERT'S SYNDROME 02/06/2007   Qualifier: Diagnosis of  By: Janelle Floor     History of breast cancer 08/04/2015   S/p b/l mastectomy   History of colonic polyps 06/06/2010   Annotation: destroyed, no clear adenomatous proven Qualifier: Diagnosis of  By: Carlean Purl MD, Tonna Boehringer E  2008 polyp 2013 neg     Hypertension    Hypertriglyceridemia 09/30/2007   statin intolerant  Father MI @ 23 Sister CVA > 65    Hypokalemia due to inadequate potassium intake 01/31/2020   IBS (irritable bowel syndrome)    Joint pain 09/02/2010   Lactic acidosis 01/31/2020   Osteoarthritis of left knee 08/04/2015   Significant arthritis in knees, limits her activity    Osteopenia 08/31/2013   [facility]  DEXA 03/20/2018: Osteopenia:  LFN -1.3, R--1.0, spine -0.6-statistically significant increase in BMD in all areas  Findings 08/22/13 : lowest T score -  1.3  @  R femoral neck (hip) ,3.4  %loss @ R femur &   4.1 % in spine   since  2012 Dexa 01/05/16: lowest  -  L femur neck T -1.6 Diagnosis: mild Osteopenia Rx: Fosamax remotely; now on Arimidex    Parkinsonian features 01/31/2020   Peripheral neuropathy 08/23/2018   Plantar fasciitis of right foot    Dr Oneta Rack   Poor balance 02/20/2018   S/P bilateral mastectomy 08/04/2015   Septic shock ([place]) 01/31/2020   Severe sepsis with acute organ dysfunction due to gram-negative bacteria ([place]) 01/31/2020   Transfusion history 1976   Type 2 diabetes mellitus with stage 3 chronic kidney disease, without long-term current use of insulin ([place])  08/11/2008   Ophth, Dr Kathrin Penner: no retinopathy  Diabetes maternal grandmother    Past Surgical History:  Procedure Laterality Date   ABDOMINAL HYSTERECTOMY  1976   with BSO due to infection from [place] IUD   [place]   @ TAH & BSO   BREAST LUMPECTOMY     [id number], [phone]   CATARACT EXTRACTION, BILATERAL Bilateral 2018   COLONOSCOPY W/ POLYPECTOMY  2006   negative 2011; Dr Carlean Purl   MASTECTOMY W/ NODES PARTIAL  2012   double mastectomy with nodes taken out on right side    PLACEMENT OF BREAST IMPLANTS  04/2011   Dr Migdalia Dk, [place]      There were no vitals filed for this visit.   Subjective Assessment - 05/12/21 1232     Subjective Slept all day yesterday, was just out of it.  Feel better today.    Patient is accompained by: Family member   husband   Pertinent History PMH:  DM, HTN, Gilbert's syndrome, IBS, closed fracture pelvis, breast cancer; UTI, hx of sepsis    Patient Stated Goals Pt's goal for therapy is to stand up and walk around without the walker.  Don't want to fall again.  Also want to work on strength (husband) for getting out of chair.    Currently in Pain? No/denies                               [facility] Adult PT Treatment/Exercise - 05/12/21 0001       Transfers   Transfers Sit to Stand;Stand to Sit    Sit to Stand 5: Supervision;4: Min assist   increased assistance from low, soft surface   Sit to Stand Details Verbal cues for technique;Verbal cues for sequencing    Stand to Sit 5: Supervision;With upper extremity assist;Without upper extremity assist    Stand to Sit Details (indicate cue type and reason) Verbal cues for sequencing;Verbal cues for technique    Transfer Cueing Cues for foot placement, scooting, and hand placement/forward lean, especially from low surface, to improve transfer efficiency.  Pt fatigues/seems to get frustrated after 2nd rep from low surface and  requests to stop (it is end of session).  Discussed with pt and husband optimal technique for transfers, especially from low surfaces    Comments from 22" mat table x 5 reps, from 18" chair no armrests x 3 reps, from low/soft surface simulating sofa x 2 reps      Ambulation/Gait   Ambulation/Gait Yes    Ambulation/Gait Assistance 5: Supervision;4: Min guard   min guard on outdoor surfaces   Ambulation/Gait Assistance Details Outdoor gait on sidewalk surfaces using rollator.  Pt takes one standing rest break, one seated rest break at locked rollator.    Ambulation Distance (Feet) 400 Feet    Assistive device Rollator    Gait Pattern Step-through  pattern;Decreased step length - right;Decreased trunk rotation;Decreased hip/knee flexion - left;Shuffle;Poor foot clearance - right    Ambulation Surface Indoor;Level;Unlevel;Outdoor    [employer] Activities Discussed/reviewed current walking program with pt/husband.  Pt is currently walking 4 laps in home with rollator, several times a time when she goes to the bathroom.  Discussed adding additional lap, perhaps after the bathroom or at mealtimes.  Discussed walking program for outdoors-see instructions    Gait Comments Cues provided for husband on safety with gait on outdoor surfaces.                    PT Education - 05/12/21 1529     Education provided Yes    Education Details See instructions for walking program outdoors    Person(s) Educated Patient    Methods Explanation    Comprehension Verbalized understanding              PT Short Term Goals - 04/29/21 1328       PT SHORT TERM GOAL #1   Title ALL STGS = LTGS               PT Long Term Goals - 04/29/21 1328       PT LONG TERM GOAL #1   Title Pt will be independent with final HEP with husband's supervision and walking program in order to build upon functional gains made in therapy. TARGET all LTGs 05/27/21    Baseline will benefit from further review/updates as  appropriate before D/C    Time 4    Period Weeks    Status On-going    Target Date 05/27/21      PT LONG TERM GOAL #2   Title Pt will perform 7 sit <> stands in 30 seconds with BUE support from standard height chair in order to demo improved transfer efficiency.    Baseline 6 sit <> stands    Time 4    Period Weeks    Status Not Met      PT LONG TERM GOAL #3   Title Pt will improve TUG score to less than or equal to 18 seconds for decreased fall risk.    Baseline 19.53 on 04/27/21 with rollator    Time 4    Period Weeks    Status Revised      PT LONG TERM GOAL #4   Title --    Baseline --    Time --    Period --    Status --      PT LONG TERM GOAL #5   Title --    Baseline --    Time --    Period --    Status --                   Plan - 05/12/21 1529     Clinical Impression Statement Skilled PT session focused on outdoor gait, instructions on progression of walking program in the home and outdoors.  Also focused on review of HEP additions last visit, with pt return demo understanding.  Transfer training from progressively lower surfaces, with pt not having any episodes of posterior lean or retropulsion with sit to stand.  She just has increased difficulty with lower surface transfers, needing additional assistance.  Husband is present for entire session for education and cues throughout.  She is continueing to benefit from skilled PT services to address balance, gait and overall improved functional mobility; anticipate discharge next visit.  Personal Factors and Comorbidities Comorbidity 3+    Comorbidities PMH:  DM, HTN, Gilbert's syndrome, IBS, closed fracture pelvis, breast cancer, hx of sepsis    Examination-Activity Limitations Locomotion Level;Transfers;Stand;Stairs    Examination-Participation Restrictions Community Activity;Meal Prep    Stability/Clinical Decision Making Evolving/Moderate complexity    Rehab Potential Good    PT Frequency 2x / week    1-2x a week   PT Duration 4 weeks    PT Treatment/Interventions ADLs/Self Care Home Management;Gait training;Stair training;Functional mobility training;Therapeutic activities;Therapeutic exercise;Balance training;Patient/family education;Neuromuscular re-education    PT Next Visit Plan Check LTGs and plan for d/c next visit.    Consulted and Agree with Plan of Care Patient             Patient will benefit from skilled therapeutic intervention in order to improve the following deficits and impairments:  Abnormal gait, Difficulty walking, Decreased balance, Decreased mobility, Decreased strength, Postural dysfunction  Visit Diagnosis: Other abnormalities of gait and mobility  Unsteadiness on feet  Other symptoms and signs involving the nervous system  Muscle weakness (generalized)     Problem List Patient Active Problem List   Diagnosis Date Noted   Head injury, acute, initial encounter 02/12/2021   UTI (urinary tract infection) 01/17/2021   Aortic atherosclerosis ([place]) 08/27/2020   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) 55/97/4163   Complicated UTI (urinary tract infection) 01/31/2020   CKD (chronic kidney disease) stage 3, GFR 30-59 ml/min ([facility]) 01/31/2020   Hypokalemia due to inadequate potassium intake 01/31/2020   Severe sepsis with acute organ dysfunction due to Gram negative bacteria ([place]) 01/31/2020   Episodic lightheadedness 08/27/2019   Dizziness 02/24/2019   Peripheral neuropathy 08/23/2018   Poor balance 02/20/2018   Genetic testing 09/04/2017   Family history of breast cancer    Family history of ovarian cancer    Fatty liver 08/05/2015   History of breast cancer 08/04/2015   S/P bilateral mastectomy 08/04/2015   Osteoarthritis of left knee 08/04/2015   Elevated LFTs 08/10/2014   Osteopenia 08/31/2013   Transfusion history 08/18/2013   IBS (irritable bowel syndrome) 08/18/2013    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 05/12/2021, 3:37 PM Frazier Butt., PT   [facility] [address] [place], [place], [address] Phone: [phone]   Fax:  [phone]  Name: [name] MRN: [id number] Date of Birth: 15-Mar-1941

## 2021-05-12 NOTE — Telephone Encounter (Signed)
I know her history but we need to know for sure if it is a UTI or not.  Please have her come in for a UA, urine culture ASAP

## 2021-05-12 NOTE — Patient Instructions (Signed)
  Walking outdoor surfaces:    -Walk on level, flat surfaces, using your rollator, with husband close supervision -Start by walking 4-5 minutes with TALL posture, BIG steps and GOOD foot clearance.  -Stop and take a seat for a sitting break if you need to at the locked rollator  -Try to add 1-2 minutes in every week or 2 to improve your walking endurance.

## 2021-05-12 NOTE — Telephone Encounter (Signed)
Spoke with patient today and they will come by this afternoon for culture.

## 2021-05-13 ENCOUNTER — Telehealth: Payer: Self-pay | Admitting: Internal Medicine

## 2021-05-13 NOTE — Telephone Encounter (Signed)
Please call her and give an update on her symptoms.  She still having fever-any other symptoms that could indicate an infection elsewhere?  Her initial urine test is not 100% convincing for infection.  It may take another day or so for the culture to come back.  Depending on her symptoms we will go ahead and start an antibiotic.  My only concern with that is increasing the risk of resistance to her infections if she does not need an antibiotic.

## 2021-05-13 NOTE — Telephone Encounter (Signed)
Lets wait for the culture to come back

## 2021-05-14 LAB — URINE CULTURE

## 2021-05-19 ENCOUNTER — Ambulatory Visit: Payer: Medicare Other | Attending: Neurology | Admitting: Physical Therapy

## 2021-05-19 ENCOUNTER — Other Ambulatory Visit: Payer: Self-pay

## 2021-05-19 ENCOUNTER — Encounter: Payer: Self-pay | Admitting: Physical Therapy

## 2021-05-19 DIAGNOSIS — R2689 Other abnormalities of gait and mobility: Secondary | ICD-10-CM | POA: Insufficient documentation

## 2021-05-19 DIAGNOSIS — R2681 Unsteadiness on feet: Secondary | ICD-10-CM | POA: Diagnosis not present

## 2021-05-19 DIAGNOSIS — R29818 Other symptoms and signs involving the nervous system: Secondary | ICD-10-CM | POA: Diagnosis not present

## 2021-05-19 NOTE — Therapy (Signed)
[facility] [address] [place], [place], [address] Phone: [phone]   Fax:  [phone]  Physical Therapy Treatment/Discharge Summary  Patient Details  Name: [name] MRN: [id number] Date of Birth: May 09, 1941 Referring Provider (PT): Metta Clines, DO   PHYSICAL THERAPY DISCHARGE SUMMARY  Visits from Start of Care: 20  Current functional level related to goals / functional outcomes:  PT Long Term Goals - 05/19/21 1420       PT LONG TERM GOAL #1   Title Pt will be independent with final HEP with husband's supervision and walking program in order to build upon functional gains made in therapy. TARGET all LTGs 05/27/21    Baseline will benefit from further review/updates as appropriate before D/C    Time 4    Period Weeks    Status Achieved      PT LONG TERM GOAL #2   Title Pt will perform 7 sit <> stands in 30 seconds with BUE support from standard height chair in order to demo improved transfer efficiency.    Baseline 6 sit <> stands; 6 sit<.>stand 30 sec    Time 4    Period Weeks    Status Not Met      PT LONG TERM GOAL #3   Title Pt will improve TUG score to less than or equal to 18 seconds for decreased fall risk.    Baseline 19.53 on 04/27/21 with rollator; 23.56 sec at best 05/19/21    Time 4    Period Weeks    Status Not Met               Remaining deficits: Balance, decreased timing and coordination of gait   Education / Equipment: Educated in [employer], fall prevention   Patient agrees to discharge. Patient goals were partially met. Patient is being discharged due to being pleased with the current functional level. Pt would benefit from return PT eval in 6-9 months due to progressive nature of disease process.  Frazier Butt., PT   Encounter Date: 05/19/2021   PT End of Session - 05/19/21 1230     Visit Number 20    Number of Visits 20    Date for PT Re-Evaluation 35/46/56   per  re-cert on 04/29/74   Authorization Type [place] has been seen by [facility] this year; will likely need KX (maybe not-private Medicare?)    Progress Note Due on Visit 10    PT Start Time 1235    PT Stop Time 1302    PT Time Calculation (min) 27 min    Equipment Utilized During Treatment Gait belt    Activity Tolerance Patient tolerated treatment well    Behavior During Therapy WFL for tasks assessed/performed             Past Medical History:  Diagnosis Date   Acute renal failure superimposed on stage 3 chronic kidney disease ([place]) 01/31/2020   Breast cancer ([place]) 2012   Cancer of central portion of right female breast ([place]) 09/04/2010   Patient has a long history of fibrocystic disease. 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 She underwent right total mastectomy with sentinel node biopsy and left total (prophylactic) mastectomy on 10/12/2010. She underwent immediate reconstruction with expander and AlloDerm placement. 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 One lymph   Complicated UTI (urinary tract infection) 01/31/2020   Diabetes mellitus    Dizziness 02/24/2019  Elevated LFTs 08/10/2014   Episodic lightheadedness 08/27/2019   Esophageal reflux 05/06/2008   Essential hypertension 02/16/2009   Family history of breast cancer    Family history of ovarian cancer    Fatigue 02/24/2019   Fatty liver 08/05/2015   Genetic testing 09/04/2017   Negative genetic testing on the common hereditary cancer panel.  The Hereditary Gene Panel offered by [facility] includes sequencing and/or deletion duplication testing of the following 47 genes: APC, ATM, AXIN2, BARD1, BMPR1A, BRCA1, BRCA2, BRIP1, CDH1, CDK4, CDKN2A (p14ARF), CDKN2A (p16INK4a), CHEK2, CTNNA1, DICER1, EPCAM (Deletion/duplication testing only), GREM1 (promoter region deletion/duplicat   GERD (gastroesophageal reflux disease)    [name] syndrome    GILBERT'S SYNDROME  02/06/2007   Qualifier: Diagnosis of  By: Janelle Floor     History of breast cancer 08/04/2015   S/p b/l mastectomy   History of colonic polyps 06/06/2010   Annotation: destroyed, no clear adenomatous proven Qualifier: Diagnosis of  By: Carlean Purl MD, Tonna Boehringer E  2008 polyp 2013 neg     Hypertension    Hypertriglyceridemia 09/30/2007   statin intolerant  Father MI @ 59 Sister CVA > 65    Hypokalemia due to inadequate potassium intake 01/31/2020   IBS (irritable bowel syndrome)    Joint pain 09/02/2010   Lactic acidosis 01/31/2020   Osteoarthritis of left knee 08/04/2015   Significant arthritis in knees, limits her activity    Osteopenia 08/31/2013   [facility]  DEXA 03/20/2018: Osteopenia:  LFN -1.3, R--1.0, spine -0.6-statistically significant increase in BMD in all areas  Findings 08/22/13 : lowest T score -  1.3  @  R femoral neck (hip) ,3.4  %loss @ R femur &   4.1 % in spine   since  2012 Dexa 01/05/16: lowest  - L femur neck T -1.6 Diagnosis: mild Osteopenia Rx: Fosamax remotely; now on Arimidex    Parkinsonian features 01/31/2020   Peripheral neuropathy 08/23/2018   Plantar fasciitis of right foot    Dr Oneta Rack   Poor balance 02/20/2018   S/P bilateral mastectomy 08/04/2015   Septic shock ([place]) 01/31/2020   Severe sepsis with acute organ dysfunction due to gram-negative bacteria ([place]) 01/31/2020   Transfusion history 1976   Type 2 diabetes mellitus with stage 3 chronic kidney disease, without long-term current use of insulin ([place]) 08/11/2008   Ophth, Dr Kathrin Penner: no retinopathy  Diabetes maternal grandmother    Past Surgical History:  Procedure Laterality Date   ABDOMINAL HYSTERECTOMY  1976   with BSO due to infection from [place] IUD   [place]   @ TAH & BSO   BREAST LUMPECTOMY     [id number], [phone]   CATARACT EXTRACTION, BILATERAL Bilateral 2018   COLONOSCOPY W/ POLYPECTOMY  2006   negative 2011; Dr Carlean Purl   MASTECTOMY W/ NODES PARTIAL  2012   double  mastectomy with nodes taken out on right side    PLACEMENT OF BREAST IMPLANTS  04/2011   Dr Migdalia Dk, [place]      There were no vitals filed for this visit.   Subjective Assessment - 05/19/21 1230     Subjective Have a question about the exercises.  No falls.    Patient is accompained by: Family member   husband   Pertinent History PMH:  DM, HTN, Gilbert's syndrome, IBS, closed fracture pelvis, breast cancer; UTI, hx of sepsis    Patient Stated Goals Pt's goal for therapy is to stand up and walk around without  the walker.  Don't want to fall again.  Also want to work on strength (husband) for getting out of chair.    Currently in Pain? No/denies                               [facility] Adult PT Treatment/Exercise - 05/19/21 0001       Transfers   Transfers Sit to Stand;Stand to Sit    Sit to Stand 5: Supervision;4: Min guard    Stand to Sit 5: Supervision;With upper extremity assist;Without upper extremity assist    Transfer Cueing Reiterated safe sit to stand transfer technique    Comments 30 sec sit<>stand from mat:  5 reps; from chair:  6 reps in 30 seconds.  Good quality, no posterior lean      Ambulation/Gait   Ambulation/Gait Yes    Ambulation/Gait Assistance 5: Supervision    Ambulation Distance (Feet) 200 Feet    Assistive device Rollator    Gait Pattern Step-through pattern;Decreased step length - right;Decreased trunk rotation;Decreased hip/knee flexion - left;Shuffle;Poor foot clearance - right    Ambulation Surface Level;Indoor    Gait velocity 16.03 sec = 2.05 ft/sec    Gait Comments Reviewed walking program additions from last visit; pt verbalizes understanding.  PT recommends pt use family/husband supervision for safety outdoors.      Timed Up and Go Test   TUG Normal TUG    Normal TUG (seconds) 23.54   28.79, 25.03 sec     Self-Care   Self-Care Other Self-Care Comments    Other Self-Care Comments  Discussed  progress towards goals, plans for discharge today.  Discussed importance of continued performance of HEP and return PT eval in 6-9 months.  Reviewed recent additions to HEP (marching, hip kicks forward) and answered pt's questions about rocking/lateral weigthshifitng exercise.                    PT Education - 05/19/21 1419     Education provided Yes    Education Details Progress towards goals, POC and plans for d/c    Person(s) Educated Patient;Spouse    Methods Explanation    Comprehension Verbalized understanding              PT Short Term Goals - 04/29/21 1328       PT SHORT TERM GOAL #1   Title ALL STGS = LTGS               PT Long Term Goals - 05/19/21 1420       PT LONG TERM GOAL #1   Title Pt will be independent with final HEP with husband's supervision and walking program in order to build upon functional gains made in therapy. TARGET all LTGs 05/27/21    Baseline will benefit from further review/updates as appropriate before D/C    Time 4    Period Weeks    Status Achieved      PT LONG TERM GOAL #2   Title Pt will perform 7 sit <> stands in 30 seconds with BUE support from standard height chair in order to demo improved transfer efficiency.    Baseline 6 sit <> stands; 6 sit<.>stand 30 sec    Time 4    Period Weeks    Status Not Met      PT LONG TERM GOAL #3   Title Pt will improve TUG score to less than or equal to  18 seconds for decreased fall risk.    Baseline 19.53 on 04/27/21 with rollator; 23.56 sec at best 05/19/21    Time 4    Period Weeks    Status Not Met                   Plan - 05/19/21 1420     Clinical Impression Statement LTGs assessed this visit, with pt meeting 1 of 3 LTGs.  LTG 1 met for updating HEP, LTG 2 not met for sit<>stand (she demo good technique of sit<>stand, only performs 6 reps in 30 seconds); LTG 3 not met for TUG score.  Through the course of therapy, pt has demonstrated improved quality and safety of  transfers as well as improved gait velocity; no recent reports of falls.  Pt and husband have been educated in HEP, fall prevention education and cues for optimal safety with gait and transfer.  She is appropriate for d/c from PT this visit.    Personal Factors and Comorbidities Comorbidity 3+    Comorbidities PMH:  DM, HTN, Gilbert's syndrome, IBS, closed fracture pelvis, breast cancer, hx of sepsis    Examination-Activity Limitations Locomotion Level;Transfers;Stand;Stairs    Examination-Participation Restrictions Community Activity;Meal Prep    Stability/Clinical Decision Making Evolving/Moderate complexity    Rehab Potential Good    PT Frequency 2x / week   1-2x a week   PT Duration 4 weeks    PT Treatment/Interventions ADLs/Self Care Home Management;Gait training;Stair training;Functional mobility training;Therapeutic activities;Therapeutic exercise;Balance training;Patient/family education;Neuromuscular re-education    PT Next Visit Plan D/C from PT this visit; plan for return PT eval in 6-9 months.    Consulted and Agree with Plan of Care Patient;Family member/caregiver    Family Member Consulted husband             Patient will benefit from skilled therapeutic intervention in order to improve the following deficits and impairments:  Abnormal gait, Difficulty walking, Decreased balance, Decreased mobility, Decreased strength, Postural dysfunction  Visit Diagnosis: Other abnormalities of gait and mobility  Unsteadiness on feet  Other symptoms and signs involving the nervous system     Problem List Patient Active Problem List   Diagnosis Date Noted   Head injury, acute, initial encounter 02/12/2021   UTI (urinary tract infection) 01/17/2021   Aortic atherosclerosis ([place]) 08/27/2020   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   Parkinson disease ([place]) 08/26/2020   Paroxysmal atrial fibrillation ([place]) 03/29/1974   Complicated UTI (urinary tract infection) 01/31/2020   CKD (chronic  kidney disease) stage 3, GFR 30-59 ml/min ([place]) 01/31/2020   Hypokalemia due to inadequate potassium intake 01/31/2020   Severe sepsis with acute organ dysfunction due to Gram negative bacteria ([place]) 01/31/2020   Episodic lightheadedness 08/27/2019   Dizziness 02/24/2019   Peripheral neuropathy 08/23/2018   Poor balance 02/20/2018   Genetic testing 09/04/2017   Family history of breast cancer    Family history of ovarian cancer    Fatty liver 08/05/2015   History of breast cancer 08/04/2015   S/P bilateral mastectomy 08/04/2015   Osteoarthritis of left knee 08/04/2015   Elevated LFTs 08/10/2014   Osteopenia 08/31/2013   Transfusion history 08/18/2013   IBS (irritable bowel syndrome) 08/18/2013   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 05/19/2021, 2:29 PM Frazier Butt., PT   [facility] [address] [place], [place], [address] Phone: [phone]   Fax:  [phone]  Name: [name] MRN: [id number] Date of Birth: [birth year]

## 2021-05-25 IMAGING — DX DG CHEST 1V PORT
1 series · 1 of 1 positions shown · non-contrast
Comparison: Chest x-rays dated 01/31/2020 and 10/11/2010.

CLINICAL DATA: New atrial flutter. Fever and syncope.

EXAM:
PORTABLE CHEST 1 VIEW

[chest ap]
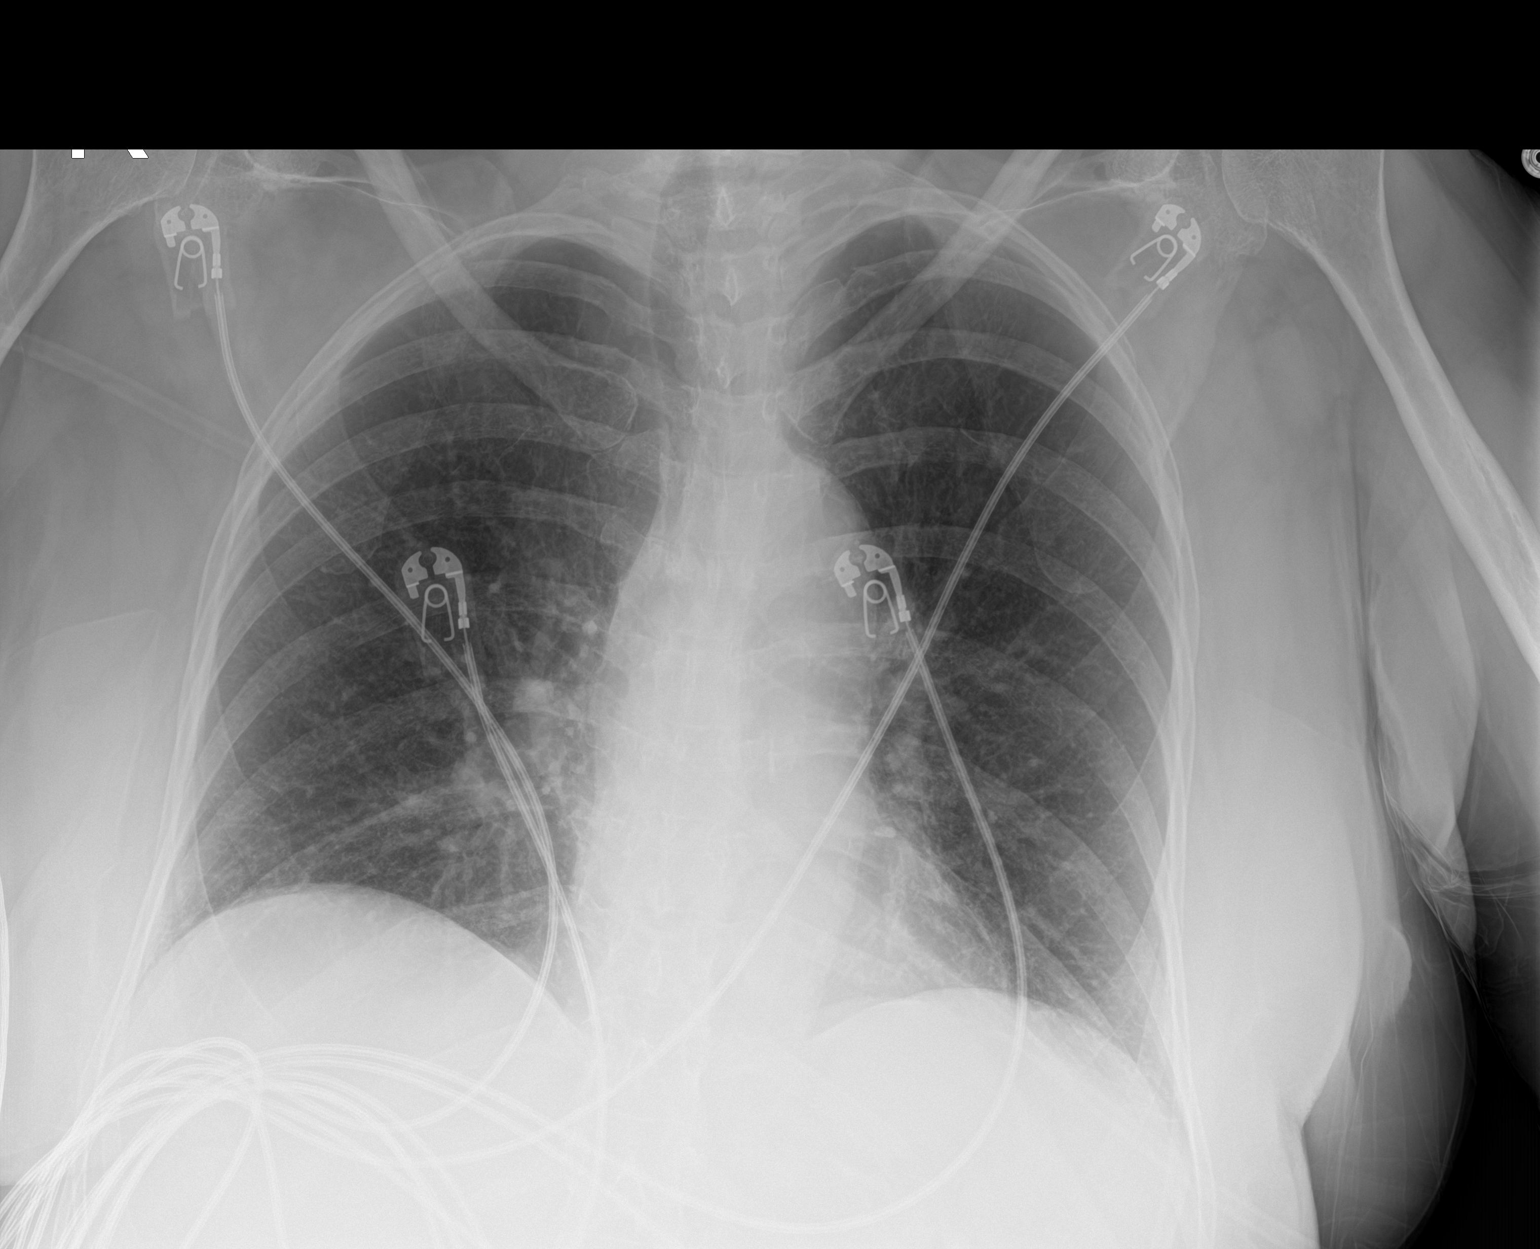

[1 of 1 positions shown; findings below may reference images not displayed]

FINDINGS: Heart size and mediastinal contours are within normal limits. Lungs
are clear. No pleural effusion or pneumothorax is seen. Osseous
structures about the chest are unremarkable.
IMPRESSION: No active disease. No evidence of pneumonia or pulmonary edema.

## 2021-05-25 IMAGING — DX DG CHEST 1V PORT
1 series · 1 of 1 positions shown · non-contrast
Comparison: None.

CLINICAL DATA: Sepsis

EXAM:
PORTABLE CHEST 1 VIEW

[chest ap]
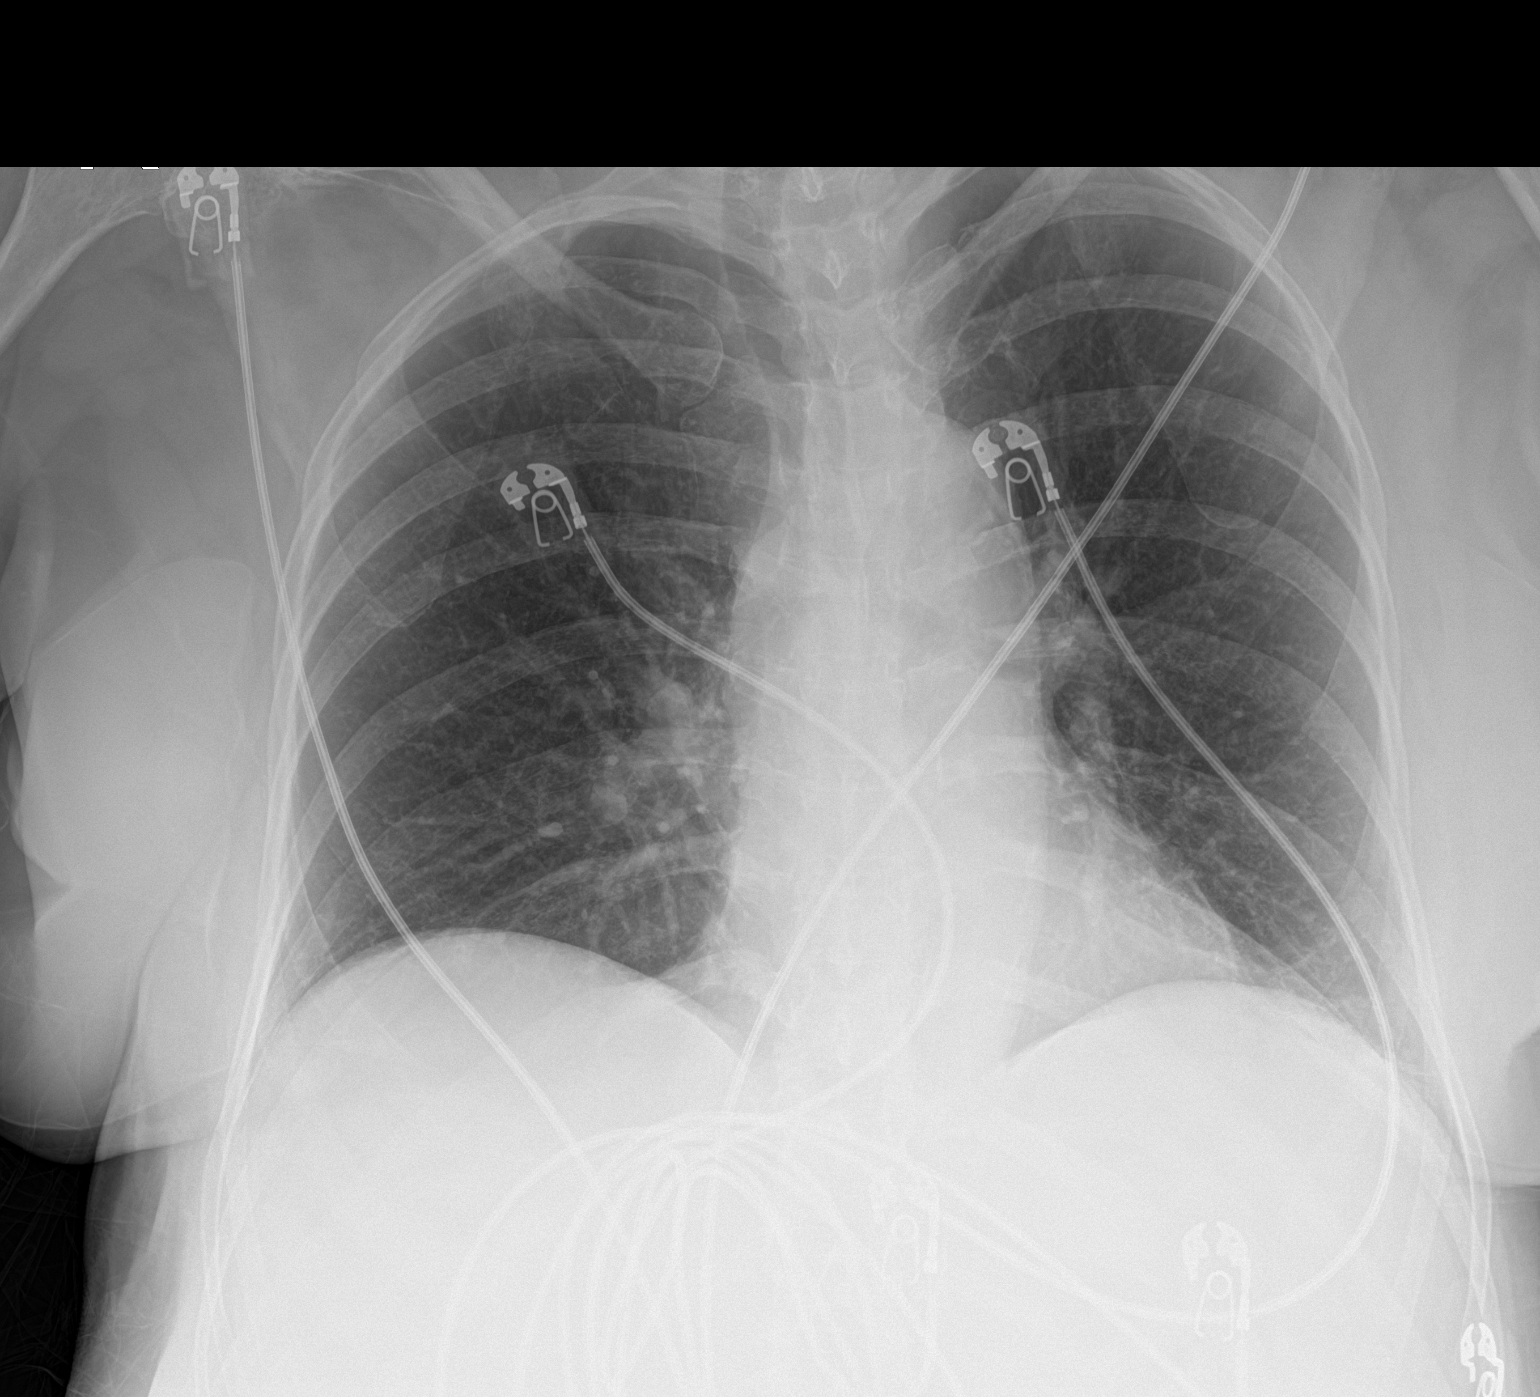

[1 of 1 positions shown; findings below may reference images not displayed]

FINDINGS: The heart size and mediastinal contours are within normal limits.
Both lungs are clear. The visualized skeletal structures are
unremarkable.
IMPRESSION: No active disease.

## 2021-05-25 IMAGING — DX DG CHEST 1V PORT
1 series · 1 of 1 positions shown · non-contrast
Comparison: Radiograph 01/31/2020

CLINICAL DATA: Desaturation

EXAM:
PORTABLE CHEST 1 VIEW

[chest ap]
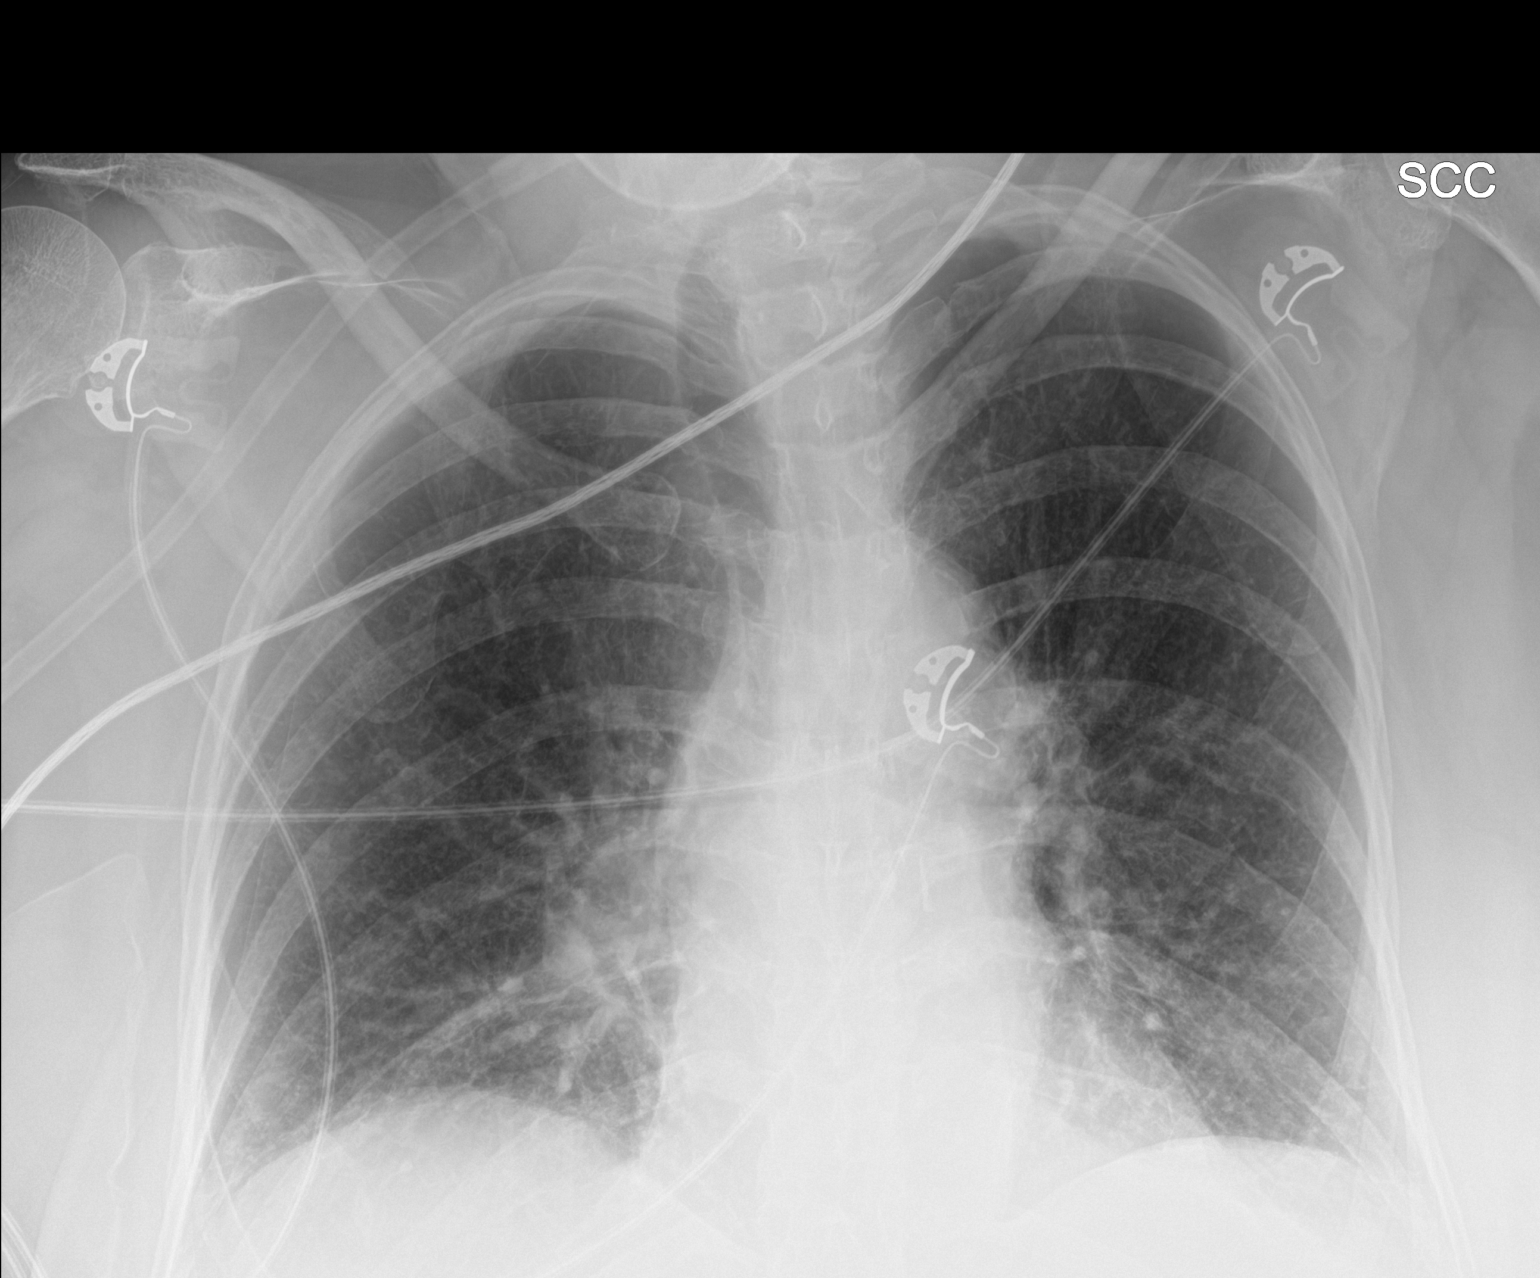

[1 of 1 positions shown; findings below may reference images not displayed]

FINDINGS: Chronically coarsened basilar interstitial changes and bronchitic
features are similar to comparison studies likely accentuated by
some basilar atelectasis. Apical lucency compatible with
emphysematous changes seen on prior cross-sectional imaging. No
consolidation, features of edema, pneumothorax, or effusion. The
aorta is calcified. The remaining cardiomediastinal contours are
unremarkable. No acute osseous or soft tissue abnormality.
Degenerative changes are present in the imaged spine and shoulders.
Mild levocurvature of the spine.
IMPRESSION: 1. Mild atelectasis, otherwise no acute cardiopulmonary abnormality.
2. Chronically coarsened basilar interstitial changes and bronchitic
features are similar to comparison studies.
3. Emphysematous changes in the lung apices.

## 2021-06-09 ENCOUNTER — Ambulatory Visit: Payer: Medicare Other | Admitting: Neurology

## 2021-06-25 ENCOUNTER — Ambulatory Visit (INDEPENDENT_AMBULATORY_CARE_PROVIDER_SITE_OTHER): Payer: Medicare Other

## 2021-06-25 DIAGNOSIS — Z23 Encounter for immunization: Secondary | ICD-10-CM

## 2021-06-29 NOTE — Progress Notes (Signed)
NEUROLOGY FOLLOW UP OFFICE NOTE  [name] [id number]  Assessment/Plan:   Parkinson's disease  Carbidopa-levodopa 25/100mg  1.5 tablets three times daly (8AM, 2PM, 10PM) Continue daily exercises Follow up 6 months.  Subjective:  [name] is a 80 year old female with diabetes, hypertension, Gilbert's syndrome, and irritable bowel syndrome who follows up for Parkinson's disease  She is accompanied by her husband who supplements history.   UPDATE: Current medication:  carbidopa-levodopa 25/100mg  1.5 tablets three times daily (before 8AM, 2 PM, 10PM.    Increased levodopa in June.  She was also referred to neuro-rehab for physical therapy.  She has been doing well.  No falls.  No significant wearing off period.     HISTORY: Since summer 2018, she has had trouble with her penmanship.  She states it is more difficult to write and that her handwriting is smaller.  She denies cramping.   In early 2019, she started having increased problems with balance.  She needs to push off in order to stand.  She feels weak in the legs.  She denies back pain or radicular pain and numbness in the feet.  She denies neck pain.  She denies freezing when initiating taking a step.  She denies dizziness or lightheadedness.  She was started on Sinemet in late 2019 and she noted some improvement in movement.  Due to presence of Babinski, she had an MRI of brain on 10/19/2018 which showed no acute abnormality, and MRI of cervical spine on 11/04/2018 which showed arthritic changes but no significant stenosis causing myelopathy   No history of REM sleep behavior disorder.  There is no family history of PD.  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 She underwent right total mastectomy with sentinel node biopsy and left total (prophylactic) mastectomy on 10/12/2010. She underwent immediate reconstruction with expander and AlloDerm placement. 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 One lymph   Complicated UTI (urinary tract infection) 01/31/2020   Diabetes mellitus    Dizziness 02/24/2019   Elevated LFTs 08/10/2014   Episodic lightheadedness 08/27/2019   Esophageal reflux 05/06/2008   Essential hypertension 02/16/2009   Family history of breast cancer    Family history of ovarian cancer    Fatigue 02/24/2019   Fatty liver 08/05/2015   Genetic testing 09/04/2017   Negative genetic testing on the common hereditary cancer panel.  The Hereditary Gene Panel offered by [facility] includes sequencing and/or deletion duplication testing of the following 47 genes: APC, ATM, AXIN2, BARD1, BMPR1A, BRCA1, BRCA2, BRIP1, CDH1, CDK4, CDKN2A (p14ARF), CDKN2A (p16INK4a), CHEK2, CTNNA1, DICER1, EPCAM (Deletion/duplication testing only), GREM1 (promoter region deletion/duplicat   GERD (gastroesophageal reflux disease)    [name] syndrome    GILBERT'S SYNDROME 02/06/2007   Qualifier: Diagnosis of  By: Janelle Floor     History of breast cancer 08/04/2015   S/p b/l mastectomy   History of colonic polyps 06/06/2010   Annotation: destroyed, no clear adenomatous proven Qualifier: Diagnosis of  By: Carlean Purl MD, Tonna Boehringer E  2008 polyp 2013 neg     Hypertension    Hypertriglyceridemia 09/30/2007   statin intolerant  Father MI @ 20 Sister  CVA > 65    Hypokalemia due to inadequate potassium intake 01/31/2020   IBS (irritable bowel syndrome)    Joint pain 09/02/2010   Lactic acidosis 01/31/2020   Osteoarthritis of left knee 08/04/2015   Significant arthritis in knees, limits her activity    Osteopenia 08/31/2013   [facility]  DEXA 03/20/2018: Osteopenia:  LFN -1.3, R--1.0, spine  -0.6-statistically significant increase in BMD in all areas  Findings 08/22/13 : lowest T score -  1.3  @  R femoral neck (hip) ,3.4  %loss @ R femur &   4.1 % in spine   since  2012 Dexa 01/05/16: lowest  - L femur neck T -1.6 Diagnosis: mild Osteopenia Rx: Fosamax remotely; now on Arimidex    Parkinsonian features 01/31/2020   Peripheral neuropathy 08/23/2018   Plantar fasciitis of right foot    Dr Jaquita Rector   Poor balance 02/20/2018   S/P bilateral mastectomy 08/04/2015   Septic shock ([facility]) 01/31/2020   Severe sepsis with acute organ dysfunction due to gram-negative bacteria ([facility]) 01/31/2020   Transfusion history 1976   Type 2 diabetes mellitus with stage 3 chronic kidney disease, without long-term current use of insulin ([facility]) 08/11/2008   Ophth, Dr Dagoberto Ligas: no retinopathy  Diabetes maternal grandmother    MEDICATIONS: Current Outpatient Medications on File Prior to Visit  Medication Sig Dispense Refill   amLODipine (NORVASC) 5 MG tablet TAKE 1 TABLET ONCE DAILY. (Patient taking differently: Take 5 mg by mouth daily.) 30 tablet 3   aspirin 81 MG chewable tablet Chew 1 tablet (81 mg total) by mouth daily. 30 tablet 0   carbidopa-levodopa (SINEMET IR) 25-100 MG tablet Take 1.5 tablets by mouth 3 (three) times daily. 135 tablet 5   carvedilol (COREG) 25 MG tablet TAKE 1 TABLET BY MOUTH TWICE DAILY WITH A MEAL. 60 tablet 5   Cyanocobalamin (VITAMIN B-12) 5000 MCG SUBL Place 5,000 mcg under the tongue daily.      metFORMIN (GLUCOPHAGE) 500 MG tablet TAKE 2 TABLETS TWICE A DAY WITH MEALS. 360 tablet 2   Multiple Vitamin (MULTIVITAMIN WITH MINERALS) TABS tablet Take 1 tablet by mouth daily.     Omega-3 Fatty Acids (FISH OIL CONCENTRATE) 1000 MG CAPS Take 1,000 mg by mouth 2 (two) times daily.     No current facility-administered medications on file prior to visit.    ALLERGIES: Allergies  Allergen Reactions   Elemental Sulfur     Flushed, funny feeling in throat    Exemestane Nausea Only     Other reaction(s): Dizziness (intolerance)   Morphine And Related    Statins Other (See Comments)    Elevated LFTs   Ace Inhibitors     REACTION: COUGH   Codeine     REACTION: VOMITTING    FAMILY HISTORY: Family History  Problem Relation Age of Onset   Heart attack Father 54   Breast cancer Sister 37        bilateral    Heart failure Sister        PMH intensive chemotherapy   Hypertension Sister    Stroke Sister 59   Stroke Mother        TMI   Breast cancer Maternal Aunt 40   Diabetes Maternal Grandmother    Ovarian cancer Paternal Grandmother    Breast cancer Maternal Aunt 67   Colon cancer Neg Hx    Stomach cancer Neg Hx    Esophageal cancer Neg Hx    Pancreatic cancer Neg Hx  Liver disease Neg Hx       Objective:  Blood pressure (!) 173/81, pulse 80, height $RemoveBe'5\' 4"'SOOGsbKHI$  (1.626 m), weight 159 lb 12.8 oz (72.5 kg), SpO2 98 %. General: No acute distress.  Patient appears well-groomed.   Head:  Normocephalic/atraumatic Eyes:  Fundi examined but not visualized Neck: supple, no paraspinal tenderness, full range of motion Heart:  Regular rate and rhythm Lungs:  Clear to auscultation bilaterally Back: No paraspinal tenderness Neurological Exam: alert and oriented to person, place, and time. Speech fluent and not dysarthric, language intact.  Hypomimia. Hypophonia.  Reduced upward gaze.  Otherwise, CN II-XII intact. No increased tone or cogwheel rigidity appreciated muscle strength 5-/5 throughout.  Bradykinesia.  Reduced finger-thumb tapping speed and amplitude.  No tremor.  Sensation to light touch intact.  Deep tendon reflexes absent throughout  Finger to nose testing intact.  Ambulates with walker.  Picks up feet well.  3 point turn with walker.  Romberg with mild sway.   Metta Clines, DO  CC: Billey Gosling, MD

## 2021-06-30 ENCOUNTER — Ambulatory Visit: Payer: Medicare Other | Admitting: Neurology

## 2021-06-30 ENCOUNTER — Encounter: Payer: Self-pay | Admitting: Neurology

## 2021-06-30 ENCOUNTER — Other Ambulatory Visit: Payer: Self-pay

## 2021-06-30 VITALS — BP 173/81 | HR 80 | Ht 64.0 in | Wt 159.8 lb

## 2021-06-30 DIAGNOSIS — G20 Parkinson's disease: Secondary | ICD-10-CM

## 2021-06-30 NOTE — Patient Instructions (Signed)
Continue carbidopa-levodopa 1 1/2 tablets at 8 AM, 2 PM and 10 PM Continue daily exercises Follow up 6 months

## 2021-07-12 ENCOUNTER — Ambulatory Visit: Payer: Medicare Other | Attending: Internal Medicine

## 2021-07-12 DIAGNOSIS — Z23 Encounter for immunization: Secondary | ICD-10-CM

## 2021-07-12 NOTE — Progress Notes (Signed)
   Covid-19 Vaccination Clinic  Name:  [name]    MRN: [id number] DOB: 1941-08-03  07/12/2021  [name] was observed post Covid-19 immunization for 15 minutes without incident. She was provided with Vaccine Information Sheet and instruction to access the V-Safe system.   [name] was instructed to call 911 with any severe reactions post vaccine: Difficulty breathing  Swelling of face and throat  A fast heartbeat  A bad rash all over body  Dizziness and weakness   Immunizations Administered     Name Date Dose VIS Date Route   Pfizer Covid-19 Vaccine Bivalent Booster 07/12/2021 10:50 AM 0.3 mL 05/18/2021 Intramuscular   Manufacturer: [place]   Lot: [id number]   [place]: [phone]

## 2021-07-28 ENCOUNTER — Ambulatory Visit (INDEPENDENT_AMBULATORY_CARE_PROVIDER_SITE_OTHER): Payer: Medicare Other | Admitting: Internal Medicine

## 2021-07-28 ENCOUNTER — Other Ambulatory Visit: Payer: Self-pay

## 2021-07-28 ENCOUNTER — Encounter: Payer: Self-pay | Admitting: Internal Medicine

## 2021-07-28 ENCOUNTER — Other Ambulatory Visit: Payer: Self-pay | Admitting: Internal Medicine

## 2021-07-28 VITALS — BP 130/88 | HR 71 | Temp 97.9°F | Ht 64.0 in | Wt 160.0 lb

## 2021-07-28 DIAGNOSIS — E1122 Type 2 diabetes mellitus with diabetic chronic kidney disease: Secondary | ICD-10-CM | POA: Diagnosis not present

## 2021-07-28 DIAGNOSIS — G20 Parkinson's disease: Secondary | ICD-10-CM | POA: Diagnosis not present

## 2021-07-28 DIAGNOSIS — I700 Atherosclerosis of aorta: Secondary | ICD-10-CM | POA: Diagnosis not present

## 2021-07-28 DIAGNOSIS — I10 Essential (primary) hypertension: Secondary | ICD-10-CM

## 2021-07-28 DIAGNOSIS — I480 Paroxysmal atrial fibrillation: Secondary | ICD-10-CM

## 2021-07-28 DIAGNOSIS — N1831 Chronic kidney disease, stage 3a: Secondary | ICD-10-CM | POA: Diagnosis not present

## 2021-07-28 DIAGNOSIS — E781 Pure hyperglyceridemia: Secondary | ICD-10-CM | POA: Diagnosis not present

## 2021-07-28 LAB — CBC WITH DIFFERENTIAL/PLATELET
Basophils Absolute: 0 10*3/uL (ref 0.0–0.1)
Basophils Relative: 0.5 % (ref 0.0–3.0)
Eosinophils Absolute: 0.2 10*3/uL (ref 0.0–0.7)
Eosinophils Relative: 2.4 % (ref 0.0–5.0)
HCT: 41 % (ref 36.0–46.0)
Hemoglobin: 13.9 g/dL (ref 12.0–15.0)
Lymphocytes Relative: 26.3 % (ref 12.0–46.0)
Lymphs Abs: 1.8 10*3/uL (ref 0.7–4.0)
MCHC: 33.8 g/dL (ref 30.0–36.0)
MCV: 87 fl (ref 78.0–100.0)
Monocytes Absolute: 0.6 10*3/uL (ref 0.1–1.0)
Monocytes Relative: 9.7 % (ref 3.0–12.0)
Neutro Abs: 4.1 10*3/uL (ref 1.4–7.7)
Neutrophils Relative %: 61.1 % (ref 43.0–77.0)
Platelets: 183 10*3/uL (ref 150.0–400.0)
RBC: 4.72 Mil/uL (ref 3.87–5.11)
RDW: 13.9 % (ref 11.5–15.5)
WBC: 6.7 10*3/uL (ref 4.0–10.5)

## 2021-07-28 LAB — COMPREHENSIVE METABOLIC PANEL
ALT: 30 U/L (ref 0–35)
AST: 26 U/L (ref 0–37)
Albumin: 4.4 g/dL (ref 3.5–5.2)
Alkaline Phosphatase: 60 U/L (ref 39–117)
BUN: 18 mg/dL (ref 6–23)
CO2: 30 mEq/L (ref 19–32)
Calcium: 9.8 mg/dL (ref 8.4–10.5)
Chloride: 104 mEq/L (ref 96–112)
Creatinine, Ser: 0.98 mg/dL (ref 0.40–1.20)
GFR: 54.78 mL/min — ABNORMAL LOW (ref >60.00)
Glucose, Bld: 124 mg/dL — ABNORMAL HIGH (ref 70–99)
Potassium: 4.1 mEq/L (ref 3.5–5.1)
Sodium: 140 mEq/L (ref 135–145)
Total Bilirubin: 1.6 mg/dL — ABNORMAL HIGH (ref 0.2–1.2)
Total Protein: 7.2 g/dL (ref 6.0–8.3)

## 2021-07-28 LAB — MICROALBUMIN / CREATININE URINE RATIO
Creatinine,U: 101.8 mg/dL
Microalb Creat Ratio: 1.1 mg/g (ref 0.0–30.0)
Microalb, Ur: 1.1 mg/dL (ref 0.0–1.9)

## 2021-07-28 LAB — LIPID PANEL
Cholesterol: 161 mg/dL (ref 0–200)
HDL: 40.2 mg/dL (ref >39.00)
LDL Cholesterol: 87 mg/dL (ref 0–99)
NonHDL: 120.78
Total CHOL/HDL Ratio: 4
Triglycerides: 169 mg/dL — ABNORMAL HIGH (ref 0.0–149.0)
VLDL: 33.8 mg/dL (ref 0.0–40.0)

## 2021-07-28 LAB — HEMOGLOBIN A1C: Hgb A1c MFr Bld: 6.1 % (ref 4.6–6.5)

## 2021-07-28 MED ORDER — METFORMIN HCL ER 750 MG PO TB24: 1500.0000 mg | ORAL_TABLET | Freq: Every day | ORAL | 1 refills | Status: DC

## 2021-07-28 NOTE — Assessment & Plan Note (Signed)
Chronic Statin intolerant Check lipids Regular exercise and healthy diet stressed

## 2021-07-28 NOTE — Addendum Note (Signed)
Addended by: Boris Lown B on: 07/28/2021 11:50 AM   Modules accepted: Orders

## 2021-07-28 NOTE — Assessment & Plan Note (Signed)
Chronic Check lipid panel  Statin intolerant Lifestyle controlled  Regular exercise and healthy diet encouraged

## 2021-07-28 NOTE — Assessment & Plan Note (Signed)
Chronic Paroxysmal Following with cardiology On ASA 81 mg, coreg 25 mg bid Cbc, cmp

## 2021-07-28 NOTE — Assessment & Plan Note (Signed)
Chronic BP well controlled Continue coreg 25 mg bid, amlodipine 5 mg daily cmp

## 2021-07-28 NOTE — Patient Instructions (Addendum)
    Blood work was ordered.      Medications changes include :   none     Please followup in 6 months  

## 2021-07-28 NOTE — Assessment & Plan Note (Signed)
Chronic Intermittent cmp

## 2021-07-28 NOTE — Progress Notes (Signed)
Subjective:    Patient ID: [name], female    DOB: Feb 01, 1941, 80 y.o.   MRN: [id number]  This visit occurred during the SARS-CoV-2 public health emergency.  Safety protocols were in place, including screening questions prior to the visit, additional usage of staff PPE, and extensive cleaning of exam room while observing appropriate contact time as indicated for disinfecting solutions.     HPI The patient is here for follow up of their chronic medical problems, including DM, htn, afib, hld, ckd, parkinson's dz  She is doing her exercises daily.    Medications and allergies reviewed with patient and updated if appropriate.  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   Parkinson disease ([place]) 08/26/2020   Paroxysmal atrial fibrillation ([place]) 17/51/0258   Complicated UTI (urinary tract infection) 01/31/2020   CKD (chronic kidney disease) stage 3, GFR 30-59 ml/min ([place]) 01/31/2020   Severe sepsis with acute organ dysfunction due to Gram negative bacteria ([place]) 01/31/2020   Episodic lightheadedness 08/27/2019   Dizziness 02/24/2019   Peripheral neuropathy 08/23/2018   Poor balance 02/20/2018   Genetic testing 09/04/2017   Family history of breast cancer    Family history of ovarian cancer    Fatty liver 08/05/2015   History of breast cancer 08/04/2015   S/P bilateral mastectomy 08/04/2015   Osteoarthritis of left knee 08/04/2015   Elevated LFTs 08/10/2014   Osteopenia 08/31/2013   Transfusion history 08/18/2013   IBS (irritable bowel syndrome) 08/18/2013   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 (Patient taking differently: Take 5 mg by mouth daily.) 30 tablet 3   aspirin 81 MG chewable tablet Chew 1 tablet (81 mg total) by mouth daily. 30 tablet 0   carbidopa-levodopa (SINEMET IR) 25-100 MG tablet Take 1.5 tablets by mouth 3 (three) times daily. 135 tablet 5   carvedilol (COREG) 25 MG tablet TAKE 1 TABLET BY MOUTH TWICE DAILY WITH A MEAL. 60 tablet 5   Cyanocobalamin (VITAMIN B-12) 5000 MCG SUBL Place 5,000 mcg under the tongue daily.      metFORMIN (GLUCOPHAGE) 500 MG tablet TAKE 2 TABLETS TWICE A DAY WITH MEALS. 360 tablet 2   Multiple Vitamin (MULTIVITAMIN WITH MINERALS) TABS tablet Take 1 tablet by mouth daily.     Omega-3 Fatty Acids (FISH OIL CONCENTRATE) 1000 MG CAPS Take 1,000 mg by mouth 2 (two) times daily.     No current facility-administered medications on file prior to visit.    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 She underwent right total mastectomy with sentinel node biopsy and left total (prophylactic) mastectomy on 10/12/2010. She underwent immediate reconstruction with  expander and AlloDerm placement. 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 One lymph   Complicated UTI (urinary tract infection) 01/31/2020   Diabetes mellitus    Dizziness 02/24/2019   Elevated LFTs 08/10/2014   Episodic lightheadedness 08/27/2019   Esophageal reflux 05/06/2008   Essential hypertension 02/16/2009   Family history of breast  cancer    Family history of ovarian cancer    Fatigue 02/24/2019   Fatty liver 08/05/2015   Genetic testing 09/04/2017   Negative genetic testing on the common hereditary cancer panel.  The Hereditary Gene Panel offered by [facility] includes sequencing and/or deletion duplication testing of the following 47 genes: APC, ATM, AXIN2, BARD1, BMPR1A, BRCA1, BRCA2, BRIP1, CDH1, CDK4, CDKN2A (p14ARF), CDKN2A (p16INK4a), CHEK2, CTNNA1, DICER1, EPCAM (Deletion/duplication testing only), GREM1 (promoter region deletion/duplicat   GERD (gastroesophageal reflux disease)    [name] syndrome    GILBERT'S SYNDROME 02/06/2007   Qualifier: Diagnosis of  By: Janelle Floor     History of breast cancer 08/04/2015   S/p b/l mastectomy   History of colonic polyps 06/06/2010   Annotation: destroyed, no clear adenomatous proven Qualifier: Diagnosis of  By: Carlean Purl MD, Tonna Boehringer E  2008 polyp 2013 neg     Hypertension    Hypertriglyceridemia 09/30/2007   statin intolerant  Father MI @ 68 Sister CVA > 65    Hypokalemia due to inadequate potassium intake 01/31/2020   IBS (irritable bowel syndrome)    Joint pain 09/02/2010   Lactic acidosis 01/31/2020   Osteoarthritis of left knee 08/04/2015   Significant arthritis in knees, limits her activity    Osteopenia 08/31/2013   [facility]  DEXA 03/20/2018: Osteopenia:  LFN -1.3, R--1.0, spine -0.6-statistically significant increase in BMD in all areas  Findings 08/22/13 : lowest T score -  1.3  @  R femoral neck (hip) ,3.4  %loss @ R femur &   4.1 % in spine   since  2012 Dexa 01/05/16: lowest  - L femur neck T -1.6 Diagnosis: mild Osteopenia Rx: Fosamax remotely; now on Arimidex    Parkinsonian features 01/31/2020   Peripheral neuropathy 08/23/2018   Plantar fasciitis of right foot    Dr Oneta Rack   Poor balance 02/20/2018   S/P bilateral mastectomy 08/04/2015   Septic shock ([place]) 01/31/2020   Severe sepsis with acute organ dysfunction due to gram-negative bacteria ([place]) 01/31/2020    Transfusion history 1976   Type 2 diabetes mellitus with stage 3 chronic kidney disease, without long-term current use of insulin ([place]) 08/11/2008   Ophth, Dr Kathrin Penner: no retinopathy  Diabetes maternal grandmother    Past Surgical History:  Procedure Laterality Date   ABDOMINAL HYSTERECTOMY  1976   with BSO due to infection from [place] IUD   [place]   @ TAH & BSO   BREAST LUMPECTOMY     [id number], [phone]   CATARACT EXTRACTION, BILATERAL Bilateral 2018   COLONOSCOPY W/ POLYPECTOMY  2006   negative 2011; Dr Carlean Purl   MASTECTOMY W/ NODES PARTIAL  2012   double mastectomy with nodes taken out on right side    PLACEMENT OF BREAST IMPLANTS  04/2011   Dr Migdalia Dk, [facility]   TONSILLECTOMY AND ADENOIDECTOMY      Social History   Socioeconomic History   Marital status: Married    Spouse name: Not on file   Number of children: 4   Years of education: Not on file   Highest education level:  Not on file  Occupational History   Occupation: retired  Tobacco Use   Smoking status: Former    Types: Cigarettes    Quit date: 09/18/1977    Years since quitting: 43.8   Smokeless tobacco: Never   Tobacco comments:    smoked 1957-1979 , up to 1 ppd  Vaping Use   Vaping Use: Never used  Substance and Sexual Activity   Alcohol use: No    Comment:  very rarely   Drug use: No   Sexual activity: Not Currently    Birth control/protection: Surgical  Other Topics Concern   Not on file  Social History Narrative   Walking for exercise   Social Determinants of Health   Financial Resource Strain: Low Risk    Difficulty of Paying Living Expenses: Not hard at all  Food Insecurity: No Food Insecurity   Worried About Charity fundraiser in the Last Year: Never true   [place] in the Last Year: Never true  Transportation Needs: No Transportation Needs   Lack of Transportation (Medical): No   Lack of Transportation (Non-Medical): No  Physical Activity:  Sufficiently Active   Days of Exercise per Week: 5 days   Minutes of Exercise per Session: 30 min  Stress: No Stress Concern Present   Feeling of Stress : Not at all  Social Connections: Socially Integrated   Frequency of Communication with Friends and Family: More than three times a week   Frequency of Social Gatherings with Friends and Family: Once a week   Attends Religious Services: More than 4 times per year   Active Member of [employer] or Organizations: No   Attends Music therapist: More than 4 times per year   Marital Status: Married    Family History  Problem Relation Age of Onset   Heart attack Father 27   Breast cancer Sister 64        bilateral    Heart failure Sister        PMH intensive chemotherapy   Hypertension Sister    Stroke Sister 88   Stroke Mother        TMI   Breast cancer Maternal Aunt 40   Diabetes Maternal Grandmother    Ovarian cancer Paternal Grandmother    Breast cancer Maternal Aunt 77   Colon cancer Neg Hx    Stomach cancer Neg Hx    Esophageal cancer Neg Hx    Pancreatic cancer Neg Hx    Liver disease Neg Hx     Review of Systems  Constitutional:  Negative for chills and fever.  Respiratory:  Negative for cough, shortness of breath and wheezing.   Cardiovascular:  Negative for chest pain and leg swelling.  Gastrointestinal:  Negative for abdominal pain and nausea.       No gerd  Neurological:  Negative for light-headedness and headaches.  Psychiatric/Behavioral:  Negative for dysphoric mood and sleep disturbance. The patient is not nervous/anxious.       Objective:   Vitals:   07/28/21 1048  BP: 130/88  Pulse: 71  Temp: 97.9 F (36.6 C)  SpO2: 99%   BP Readings from Last 3 Encounters:  07/28/21 130/88  06/30/21 (!) 173/81  02/25/21 (!) 147/79   Wt Readings from Last 3 Encounters:  07/28/21 160 lb (72.6 kg)  06/30/21 159 lb 12.8 oz (72.5 kg)  02/25/21 157 lb 9.6 oz (71.5 kg)   Body mass index is 27.46  kg/m.   Physical  Exam    Constitutional: Appears well-developed and well-nourished. No distress.  HENT:  Head: Normocephalic and atraumatic.  Neck: Neck supple. No tracheal deviation present. No thyromegaly present.  No cervical lymphadenopathy Cardiovascular: Normal rate, regular rhythm and normal heart sounds.   No murmur heard. No carotid bruit .  No edema Pulmonary/Chest: Effort normal and breath sounds normal. No respiratory distress. No has no wheezes. No rales.  Skin: Skin is warm and dry. Not diaphoretic.  Psychiatric: Normal mood and affect. Behavior is normal.      Assessment & Plan:    See Problem List for Assessment and Plan of chronic medical problems.

## 2021-07-28 NOTE — Assessment & Plan Note (Signed)
Chronic Sugars have been controlled Kidney function improved with last blood work a1c today Continue metformin 500 mg bid - can consider decreasing to once a day depending on a1c

## 2021-07-28 NOTE — Assessment & Plan Note (Signed)
Chronic Dr Loretta Plume On Sinemet

## 2021-08-05 ENCOUNTER — Other Ambulatory Visit (HOSPITAL_BASED_OUTPATIENT_CLINIC_OR_DEPARTMENT_OTHER): Payer: Self-pay

## 2021-08-05 MED ORDER — PFIZER COVID-19 VAC BIVALENT 30 MCG/0.3ML IM SUSP
INTRAMUSCULAR | 0 refills | Status: DC
  Filled 2021-08-05: qty 0.3, 1d supply, fill #0

## 2021-09-26 ENCOUNTER — Other Ambulatory Visit: Payer: Self-pay | Admitting: Neurology

## 2021-10-17 ENCOUNTER — Ambulatory Visit (INDEPENDENT_AMBULATORY_CARE_PROVIDER_SITE_OTHER): Payer: Medicare Other

## 2021-10-17 ENCOUNTER — Other Ambulatory Visit: Payer: Self-pay

## 2021-10-17 DIAGNOSIS — Z780 Asymptomatic menopausal state: Secondary | ICD-10-CM | POA: Diagnosis not present

## 2021-10-17 DIAGNOSIS — Z0000 Encounter for general adult medical examination without abnormal findings: Secondary | ICD-10-CM | POA: Diagnosis not present

## 2021-10-17 NOTE — Progress Notes (Signed)
Subjective:   [name] is a 81 y.o. female who presents for Medicare Annual (Subsequent) preventive examination.  I connected with [name] today by telephone and verified that I am speaking with the correct person using two identifiers. Location patient: home Location provider: work Persons participating in the virtual visit: patient, provider.   I discussed the limitations, risks, security and privacy concerns of performing an evaluation and management service by telephone and the availability of in person appointments. I also discussed with the patient that there may be a patient responsible charge related to this service. The patient expressed understanding and verbally consented to this telephonic visit.    Interactive audio and video telecommunications were attempted between this provider and patient, however failed, due to patient having technical difficulties OR patient did not have access to video capability.  We continued and completed visit with audio only.    Review of Systems     Cardiac Risk Factors include: advanced age (>76mn, >>57women);diabetes mellitus     Objective:    Today's Vitals   There is no height or weight on file to calculate BMI.  Advanced Directives 10/17/2021 03/04/2021 02/25/2021 01/18/2021 01/17/2021 08/27/2020 01/31/2020  Does Patient Have a Medical Advance Directive? Yes Yes Yes No No Yes No  Type of AParamed[place]of [place] will [place] - - Living will;Healthcare Power of Attorney -  Does patient want to make changes to medical advance directive? - No - Patient declined - - - No - Patient declined -  Copy of [place] Chart? No - copy requested - - - - No - copy requested -  Would patient like information on creating a medical advance directive? - - - No - Patient declined No - Patient declined - No - Patient  declined    Current Medications (verified) Outpatient Encounter Medications as of 10/17/2021  Medication Sig   amLODipine (NORVASC) 5 MG tablet TAKE 1 TABLET ONCE DAILY. (Patient taking differently: Take 5 mg by mouth daily.)   aspirin 81 MG chewable tablet Chew 1 tablet (81 mg total) by mouth daily.   carbidopa-levodopa (SINEMET IR) 25-100 MG tablet TAKE 1 & 1/2 TABLETS BY MOUTH THREE TIMES DAILY   carvedilol (COREG) 25 MG tablet TAKE 1 TABLET BY MOUTH TWICE DAILY WITH A MEAL.   Cyanocobalamin (VITAMIN B-12) 5000 MCG SUBL Place 5,000 mcg under the tongue daily.    metFORMIN (GLUCOPHAGE XR) 750 MG 24 hr tablet Take 2 tablets (1,500 mg total) by mouth daily with breakfast.   Multiple Vitamin (MULTIVITAMIN WITH MINERALS) TABS tablet Take 1 tablet by mouth daily.   Omega-3 Fatty Acids (FISH OIL CONCENTRATE) 1000 MG CAPS Take 1,000 mg by mouth 2 (two) times daily.   COVID-19 mRNA bivalent vaccine, Pfizer, (PFIZER COVID-19 VAC BIVALENT) injection Inject into the muscle.   No facility-administered encounter medications on file as of 10/17/2021.    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 She underwent right total mastectomy with sentinel node biopsy and left total (prophylactic) mastectomy on 10/12/2010. She underwent  immediate reconstruction with expander and AlloDerm placement. 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 One lymph   Complicated UTI (urinary tract infection) 01/31/2020   Diabetes mellitus    Dizziness 02/24/2019   Elevated LFTs  08/10/2014   Episodic lightheadedness 08/27/2019   Esophageal reflux 05/06/2008   Essential hypertension 02/16/2009   Family history of breast cancer    Family history of ovarian cancer    Fatigue 02/24/2019   Fatty liver 08/05/2015   Genetic testing 09/04/2017   Negative genetic testing on the common hereditary cancer panel.  The Hereditary Gene Panel offered by [facility] includes sequencing and/or deletion duplication testing of the following 47 genes: APC, ATM, AXIN2, BARD1, BMPR1A, BRCA1, BRCA2, BRIP1, CDH1, CDK4, CDKN2A (p14ARF), CDKN2A (p16INK4a), CHEK2, CTNNA1, DICER1, EPCAM (Deletion/duplication testing only), GREM1 (promoter region deletion/duplicat   GERD (gastroesophageal reflux disease)    [name] syndrome    GILBERT'S SYNDROME 02/06/2007   Qualifier: Diagnosis of  By: Janelle Floor     History of breast cancer 08/04/2015   S/p b/l mastectomy   History of colonic polyps 06/06/2010   Annotation: destroyed, no clear adenomatous proven Qualifier: Diagnosis of  By: Carlean Purl MD, Tonna Boehringer E  2008 polyp 2013 neg     Hypertension    Hypertriglyceridemia 09/30/2007   statin intolerant  Father MI @ 66 Sister CVA > 65    Hypokalemia due to inadequate potassium intake 01/31/2020   IBS (irritable bowel syndrome)    Joint pain 09/02/2010   Lactic acidosis 01/31/2020   Osteoarthritis of left knee 08/04/2015   Significant arthritis in knees, limits her activity    Osteopenia 08/31/2013   [facility]  DEXA 03/20/2018: Osteopenia:  LFN -1.3, R--1.0, spine -0.6-statistically significant increase in BMD in all areas  Findings 08/22/13 : lowest T score -  1.3  @  R femoral neck (hip) ,3.4  %loss @ R femur &   4.1 % in spine   since  2012 Dexa 01/05/16: lowest  - L femur neck T -1.6 Diagnosis: mild Osteopenia Rx: Fosamax remotely; now on Arimidex    Parkinsonian features 01/31/2020   Peripheral neuropathy 08/23/2018   Plantar fasciitis of right foot    Dr Oneta Rack   Poor balance 02/20/2018   S/P bilateral mastectomy  08/04/2015   Septic shock ([place]) 01/31/2020   Severe sepsis with acute organ dysfunction due to gram-negative bacteria ([place]) 01/31/2020   Transfusion history 1976   Type 2 diabetes mellitus with stage 3 chronic kidney disease, without long-term current use of insulin ([place]) 08/11/2008   Ophth, Dr Kathrin Penner: no retinopathy  Diabetes maternal grandmother   Past Surgical History:  Procedure Laterality Date   ABDOMINAL HYSTERECTOMY  1976   with BSO due to infection from [place] IUD   [place]   @ [facility] & BSO   BREAST LUMPECTOMY     [id number], [phone]   CATARACT EXTRACTION, BILATERAL Bilateral 2018   COLONOSCOPY W/ POLYPECTOMY  2006   negative 2011; Dr Carlean Purl   MASTECTOMY W/ NODES PARTIAL  2012   double mastectomy with nodes taken out on right side    PLACEMENT OF BREAST IMPLANTS  04/2011   Dr Migdalia Dk, [facility]   TONSILLECTOMY AND ADENOIDECTOMY     Family History  Problem Relation Age of Onset   Heart attack Father 28   Breast cancer Sister 45        bilateral    Heart failure Sister  PMH intensive chemotherapy   Hypertension Sister    Stroke Sister 60   Stroke Mother        TMI   Breast cancer Maternal Aunt 40   Diabetes Maternal Grandmother    Ovarian cancer Paternal Grandmother    Breast cancer Maternal Aunt 77   Colon cancer Neg Hx    Stomach cancer Neg Hx    Esophageal cancer Neg Hx    Pancreatic cancer Neg Hx    Liver disease Neg Hx    Social History   Socioeconomic History   Marital status: Married    Spouse name: Not on file   Number of children: 4   Years of education: Not on file   Highest education level: Not on file  Occupational History   Occupation: retired  Tobacco Use   Smoking status: Former    Types: Cigarettes    Quit date: 09/18/1977    Years since quitting: 44.1   Smokeless tobacco: Never   Tobacco comments:    smoked 1957-1979 , up to 1 ppd  Vaping Use   Vaping Use: Never used  Substance and Sexual Activity    Alcohol use: No    Comment:  very rarely   Drug use: No   Sexual activity: Not Currently    Birth control/protection: Surgical  Other Topics Concern   Not on file  Social History Narrative   Walking for exercise   Social Determinants of Health   Financial Resource Strain: Low Risk    Difficulty of Paying Living Expenses: Not hard at all  Food Insecurity: No Food Insecurity   Worried About Charity fundraiser in the Last Year: Never true   [place] in the Last Year: Never true  Transportation Needs: No Transportation Needs   Lack of Transportation (Medical): No   Lack of Transportation (Non-Medical): No  Physical Activity: Inactive   Days of Exercise per Week: 0 days   Minutes of Exercise per Session: 0 min  Stress: No Stress Concern Present   Feeling of Stress : Not at all  Social Connections: Moderately Isolated   Frequency of Communication with Friends and Family: Three times a week   Frequency of Social Gatherings with Friends and Family: Three times a week   Attends Religious Services: Never   Active Member of Clubs or Organizations: No   Attends Archivist Meetings: Never   Marital Status: Married    Tobacco Counseling Counseling given: Not Answered Tobacco comments: smoked [phone] , up to 1 ppd   Clinical Intake:  Pre-visit preparation completed: Yes  Pain : No/denies pain     Nutritional Risks: None Diabetes: Yes CBG done?: No Did pt. bring in CBG monitor from home?: No  How often do you need to have someone help you when you read instructions, pamphlets, or other written materials from your doctor or pharmacy?: 1 - Never What is the last grade level you completed in school?: college  Diabetic?yes Nutrition Risk Assessment:  Has the patient had any N/V/D within the last 2 months?  No  Does the patient have any non-healing wounds?  No  Has the patient had any unintentional weight loss or weight gain?  No   Diabetes:  Is the  patient diabetic?  No  If diabetic, was a CBG obtained today?  No  Did the patient bring in their glucometer from home?  No  How often do you monitor your CBG's? Once day .   Financial  Strains and Diabetes Management:  Are you having any financial strains with the device, your supplies or your medication? No .  Does the patient want to be seen by Chronic Care Management for management of their diabetes?  No  Would the patient like to be referred to a Nutritionist or for Diabetic Management?  No   Diabetic Exams:  Diabetic Eye Exam: Completed 02/2021 Diabetic Foot Exam: Overdue, Pt has been advised about the importance in completing this exam. Pt is scheduled for diabetic foot exam on next office visit .   Interpreter Needed?: No  Information entered by :: [place] of Daily Living In your present state of health, do you have any difficulty performing the following activities: 10/17/2021 01/18/2021  Hearing? N N  Vision? N N  Difficulty concentrating or making decisions? N N  Walking or climbing stairs? N Y  Comment - ballance issues, gen weakness  Dressing or bathing? N N  Doing errands, shopping? N Y  Conservation officer, nature and eating ? N -  Using the Toilet? N -  In the past six months, have you accidently leaked urine? N -  Do you have problems with loss of bowel control? N -  Managing your Medications? N -  Managing your Finances? N -  Housekeeping or managing your Housekeeping? N -  Some recent data might be hidden    Patient Care Team: Binnie Rail, MD as PCP - General (Internal Medicine) Jerline Pain, MD as PCP - Cardiology (Cardiology) Pieter Partridge, DO as Consulting Physician (Neurology) Shon Hough, MD as Consulting Physician (Ophthalmology) Charlton Haws, [facility] as Pharmacist (Pharmacist)  Indicate any recent Medical Services you may have received from other than [facility] providers in the past year (date may be approximate).     Assessment:    This is a routine wellness examination for Brandye.  Hearing/Vision screen Vision Screening - Comments:: Annual eye exams   Dietary issues and exercise activities discussed: Current Exercise Habits: The patient does not participate in regular exercise at present, Exercise limited by: neurologic condition(s)   Goals Addressed             This Visit's Progress    Patient Stated   On track    Lose weight by increasing physical activity, monitoring my diet closely and using portion control.       Depression Screen PHQ 2/9 Scores 10/17/2021 10/17/2021 08/27/2020 04/01/2019 02/24/2019 02/20/2018 12/19/2017  PHQ - 2 Score 0 0 0 0 0 0 0    Fall Risk Fall Risk  10/17/2021 02/25/2021 08/27/2020 04/01/2019 02/24/2019  Falls in the past year? 0 1 0 0 1  Number falls in past yr: 0 1 0 0 0  Injury with Fall? 0 1 0 - 1  Risk for fall due to : - - No Fall Risks - -  Follow up Falls evaluation completed - Falls evaluation completed - -  Comment uses walker - - - -    FALL RISK PREVENTION PERTAINING TO THE HOME:  Any stairs in or around the home? Yes  If so, are there any without handrails? No  Home free of loose throw rugs in walkways, pet beds, electrical cords, etc? Yes  Adequate lighting in your home to reduce risk of falls? Yes   ASSISTIVE DEVICES UTILIZED TO PREVENT FALLS:  Life alert? No  Use of a cane, walker or w/c? Yes  Grab bars in the bathroom? Yes  Shower chair or bench in  shower? Yes  Elevated toilet seat or a handicapped toilet? No    Cognitive Function:Normal cognitive status assessed by direct observation by this Nurse Health Advisor. No abnormalities found.   MMSE - Mini Mental State Exam 02/20/2018  Orientation to time 5  Orientation to Place 5  Registration 3  Attention/ Calculation 5  Recall 3  Language- name 2 objects 2  Language- repeat 1  Language- follow 3 step command 3  Language- read & follow direction 1  Write a sentence 1  Copy design 1  Total score 30         Immunizations Immunization History  Administered Date(s) Administered   Fluad Quad(high Dose 65+) 06/28/2019, 07/17/2020, 06/25/2021   H1N1 09/15/2008   Influenza Split 07/24/2011   Influenza Whole 07/29/2008, 08/06/2009, 08/19/2010   Influenza, High Dose Seasonal PF 08/18/2013, 06/18/2015, 07/24/2016, 07/10/2017, 07/24/2018   Influenza, Seasonal, Injecte, Preservative Fre 08/14/2012   Influenza,inj,Quad PF,6+ Mos 05/28/2014   PFIZER(Purple Top)SARS-COV-2 Vaccination 10/10/2019, 10/31/2019, 07/31/2020   Pfizer Covid-19 Vaccine Bivalent Booster 25yr & up 07/12/2021   Pneumococcal Conjugate-13 09/07/2014   Pneumococcal Polysaccharide-23 10/05/2011   Tetanus 02/26/2014   Zoster, Live 08/19/2011    TDAP status: Up to date  Flu Vaccine status: Up to date  Pneumococcal vaccine status: Up to date  Covid-19 vaccine status: Completed vaccines  Qualifies for Shingles Vaccine? Yes   Zostavax completed No   Shingrix Completed?: No.    Education has been provided regarding the importance of this vaccine. Patient has been advised to call insurance company to determine out of pocket expense if they have not yet received this vaccine. Advised may also receive vaccine at local pharmacy or Health Dept. Verbalized acceptance and understanding.  Screening Tests Health Maintenance  Topic Date Due   Zoster Vaccines- Shingrix (1 of 2) Never done   OPHTHALMOLOGY EXAM  02/13/2020   FOOT EXAM  02/24/2020   DEXA SCAN  03/20/2020   HEMOGLOBIN A1C  01/25/2022   URINE MICROALBUMIN  07/28/2022   TETANUS/TDAP  02/27/2024   Pneumonia Vaccine 80 Years old  Completed   INFLUENZA VACCINE  Completed   COVID-19 Vaccine  Completed   HPV VACCINES  Aged Out    Health Maintenance  Health Maintenance Due  Topic Date Due   Zoster Vaccines- Shingrix (1 of 2) Never done   OPHTHALMOLOGY EXAM  02/13/2020   FOOT EXAM  02/24/2020   DEXA SCAN  03/20/2020    Colorectal cancer screening: No longer  required.   Mammogram status: No longer required due to age.  Bone Density status: Ordered 10/17/2020. Pt provided with contact info and advised to call to schedule appt.  Lung Cancer Screening: (Low Dose CT Chest recommended if Age 80-80years, 30 pack-year currently smoking OR have quit w/in 15years.) does not qualify.   Lung Cancer Screening Referral: n/a  Additional Screening:  Hepatitis C Screening: does not qualify;   Vision Screening: Recommended annual ophthalmology exams for early detection of glaucoma and other disorders of the eye. Is the patient up to date with their annual eye exam?  Yes  Who is the provider or what is the name of the office in which the patient attends annual eye exams? Dr.Bowen  If pt is not established with a provider, would they like to be referred to a provider to establish care? No .   Dental Screening: Recommended annual dental exams for proper oral hygiene  Community Resource Referral / Chronic Care Management: CRR required this visit?  No  CCM required this visit?  No      Plan:     I have personally reviewed and noted the following in the patients chart:   Medical and social history Use of alcohol, tobacco or illicit drugs  Current medications and supplements including opioid prescriptions.  Functional ability and status Nutritional status Physical activity Advanced directives List of other physicians Hospitalizations, surgeries, and ER visits in previous 12 months Vitals Screenings to include cognitive, depression, and falls Referrals and appointments  In addition, I have reviewed and discussed with patient certain preventive protocols, quality metrics, and best practice recommendations. A written personalized care plan for preventive services as well as general preventive health recommendations were provided to patient.     Randel Pigg, LPN   [id number]   Nurse Notes: none

## 2021-10-17 NOTE — Patient Instructions (Signed)
[name] , Thank you for taking time to come for your Medicare Wellness Visit. I appreciate your ongoing commitment to your health goals. Please review the following plan we discussed and let me know if I can assist you in the future.   Screening recommendations/referrals: Colonoscopy: No longer required  Mammogram: no longer required  Bone Density: referral 10/17/2021 Recommended yearly ophthalmology/optometry visit for glaucoma screening and checkup Recommended yearly dental visit for hygiene and checkup  Vaccinations: Influenza vaccine: completed  Pneumococcal vaccine: completed  Tdap vaccine: 02/26/2014 Shingles vaccine: will consider     Advanced directives: yes  Conditions/risks identified: none   Next appointment: none    Preventive Care 43 Years and Older, Female Preventive care refers to lifestyle choices and visits with your health care provider that can promote health and wellness. What does preventive care include? A yearly physical exam. This is also called an annual well check. Dental exams once or twice a year. Routine eye exams. Ask your health care provider how often you should have your eyes checked. Personal lifestyle choices, including: Daily care of your teeth and gums. Regular physical activity. Eating a healthy diet. Avoiding tobacco and drug use. Limiting alcohol use. Practicing safe sex. Taking low-dose aspirin every day. Taking vitamin and mineral supplements as recommended by your health care provider. What happens during an annual well check? The services and screenings done by your health care provider during your annual well check will depend on your age, overall health, lifestyle risk factors, and family history of disease. Counseling  Your health care provider may ask you questions about your: Alcohol use. Tobacco use. Drug use. Emotional well-being. Home and relationship well-being. Sexual activity. Eating habits. History of  falls. Memory and ability to understand (cognition). Work and work Statistician. Reproductive health. Screening  You may have the following tests or measurements: Height, weight, and BMI. Blood pressure. Lipid and cholesterol levels. These may be checked every 5 years, or more frequently if you are over 9 years old. Skin check. Lung cancer screening. You may have this screening every year starting at age 12 if you have a 30-pack-year history of smoking and currently smoke or have quit within the past 15 years. Fecal occult blood test (FOBT) of the stool. You may have this test every year starting at age 63. Flexible sigmoidoscopy or colonoscopy. You may have a sigmoidoscopy every 5 years or a colonoscopy every 10 years starting at age 36. Hepatitis C blood test. Hepatitis B blood test. Sexually transmitted disease (STD) testing. Diabetes screening. This is done by checking your blood sugar (glucose) after you have not eaten for a while (fasting). You may have this done every 1-3 years. Bone density scan. This is done to screen for osteoporosis. You may have this done starting at age 39. Mammogram. This may be done every 1-2 years. Talk to your health care provider about how often you should have regular mammograms. Talk with your health care provider about your test results, treatment options, and if necessary, the need for more tests. Vaccines  Your health care provider may recommend certain vaccines, such as: Influenza vaccine. This is recommended every year. Tetanus, diphtheria, and acellular pertussis (Tdap, Td) vaccine. You may need a Td booster every 10 years. Zoster vaccine. You may need this after age 37. Pneumococcal 13-valent conjugate (PCV13) vaccine. One dose is recommended after age 85. Pneumococcal polysaccharide (PPSV23) vaccine. One dose is recommended after age 50. Talk to your health care provider about which screenings and  vaccines you need and how often you need  them. This information is not intended to replace advice given to you by your health care provider. Make sure you discuss any questions you have with your health care provider. Document Released: 10/01/2015 Document Revised: 05/24/2016 Document Reviewed: 07/06/2015 Elsevier Interactive Patient Education  2017 [employer] Prevention in the Home Falls can cause injuries. They can happen to people of all ages. There are many things you can do to make your home safe and to help prevent falls. What can I do on the outside of my home? Regularly fix the edges of walkways and driveways and fix any cracks. Remove anything that might make you trip as you walk through a door, such as a raised step or threshold. Trim any bushes or trees on the path to your home. Use bright outdoor lighting. Clear any walking paths of anything that might make someone trip, such as rocks or tools. Regularly check to see if handrails are loose or broken. Make sure that both sides of any steps have handrails. Any raised decks and porches should have guardrails on the edges. Have any leaves, snow, or ice cleared regularly. Use sand or salt on walking paths during winter. Clean up any spills in your garage right away. This includes oil or grease spills. What can I do in the bathroom? Use night lights. Install grab bars by the toilet and in the tub and shower. Do not use towel bars as grab bars. Use non-skid mats or decals in the tub or shower. If you need to sit down in the shower, use a plastic, non-slip stool. Keep the floor dry. Clean up any water that spills on the floor as soon as it happens. Remove soap buildup in the tub or shower regularly. Attach bath mats securely with double-sided non-slip rug tape. Do not have throw rugs and other things on the floor that can make you trip. What can I do in the bedroom? Use night lights. Make sure that you have a light by your bed that is easy to reach. Do not use  any sheets or blankets that are too big for your bed. They should not hang down onto the floor. Have a firm chair that has side arms. You can use this for support while you get dressed. Do not have throw rugs and other things on the floor that can make you trip. What can I do in the kitchen? Clean up any spills right away. Avoid walking on wet floors. Keep items that you use a lot in easy-to-reach places. If you need to reach something above you, use a strong step stool that has a grab bar. Keep electrical cords out of the way. Do not use floor polish or wax that makes floors slippery. If you must use wax, use non-skid floor wax. Do not have throw rugs and other things on the floor that can make you trip. What can I do with my stairs? Do not leave any items on the stairs. Make sure that there are handrails on both sides of the stairs and use them. Fix handrails that are broken or loose. Make sure that handrails are as long as the stairways. Check any carpeting to make sure that it is firmly attached to the stairs. Fix any carpet that is loose or worn. Avoid having throw rugs at the top or bottom of the stairs. If you do have throw rugs, attach them to the floor with carpet tape. Make  sure that you have a light switch at the top of the stairs and the bottom of the stairs. If you do not have them, ask someone to add them for you. What else can I do to help prevent falls? Wear shoes that: Do not have high heels. Have rubber bottoms. Are comfortable and fit you well. Are closed at the toe. Do not wear sandals. If you use a stepladder: Make sure that it is fully opened. Do not climb a closed stepladder. Make sure that both sides of the stepladder are locked into place. Ask someone to hold it for you, if possible. Clearly mark and make sure that you can see: Any grab bars or handrails. First and last steps. Where the edge of each step is. Use tools that help you move around (mobility aids)  if they are needed. These include: Canes. Walkers. Scooters. Crutches. Turn on the lights when you go into a dark area. Replace any light bulbs as soon as they burn out. Set up your furniture so you have a clear path. Avoid moving your furniture around. If any of your floors are uneven, fix them. If there are any pets around you, be aware of where they are. Review your medicines with your doctor. Some medicines can make you feel dizzy. This can increase your chance of falling. Ask your doctor what other things that you can do to help prevent falls. This information is not intended to replace advice given to you by your health care provider. Make sure you discuss any questions you have with your health care provider. Document Released: 07/01/2009 Document Revised: 02/10/2016 Document Reviewed: 10/09/2014 Elsevier Interactive Patient Education  2017 [employer].

## 2021-11-01 ENCOUNTER — Other Ambulatory Visit: Payer: Self-pay | Admitting: Internal Medicine

## 2021-11-07 ENCOUNTER — Telehealth: Payer: Self-pay | Admitting: Neurology

## 2021-11-07 MED ORDER — CARBIDOPA-LEVODOPA 25-100 MG PO TABS: ORAL_TABLET | ORAL | 2 refills | Status: DC

## 2021-11-07 NOTE — Telephone Encounter (Signed)
Patient called and stated she needs a refill for the carbidopa-levodopa.  She uses [facility].

## 2021-11-07 NOTE — Telephone Encounter (Signed)
Refill sent in for pt. 

## 2021-11-09 ENCOUNTER — Telehealth: Payer: Self-pay | Admitting: Physical Therapy

## 2021-11-09 DIAGNOSIS — G20 Parkinson's disease: Secondary | ICD-10-CM

## 2021-11-09 DIAGNOSIS — R2689 Other abnormalities of gait and mobility: Secondary | ICD-10-CM

## 2021-11-09 NOTE — Telephone Encounter (Signed)
Dr. Tomi Likens,  [name] is returning to [facility] for Physical Therapy for return PD eval for balance, gait.   If you agree to place this order, please place an order in [facility] workque in [facility] or fax the order to [phone].  Thank you, Janann August, PT, DPT 11/09/21 4:21 PM    [place] [address] [place], [place]  [address] Phone:  [phone] Fax:  [phone]

## 2021-11-10 NOTE — Telephone Encounter (Signed)
Order added.

## 2021-11-22 ENCOUNTER — Ambulatory Visit: Payer: Medicare Other | Admitting: Physical Therapy

## 2021-11-29 DIAGNOSIS — H26492 Other secondary cataract, left eye: Secondary | ICD-10-CM | POA: Diagnosis not present

## 2021-11-29 DIAGNOSIS — E119 Type 2 diabetes mellitus without complications: Secondary | ICD-10-CM | POA: Diagnosis not present

## 2022-01-09 LAB — HM DIABETES EYE EXAM

## 2022-01-12 NOTE — Progress Notes (Signed)
? ?NEUROLOGY FOLLOW UP OFFICE NOTE ? ?[name] ?[id number] ? ?Assessment/Plan:  ? ?Parkinson's disease ?Hypertension - quite elevated today.  Didn't take her medication this morning. ?  ?Increase carbidopa-levodopa 25/100mg  1.5 tablets four times daly (8AM, 12PM, 4PM, 10PM) ?Continue daily exercises ?Advised to take her blood pressure medication ASAP ?Follow up 6 months. ?  ?Subjective:  ?[name] is a 81 year old female with diabetes, hypertension, Gilbert's syndrome, and irritable bowel syndrome who follows up for Parkinson's disease  She is accompanied by her husband who supplements history. ?  ?UPDATE: ?Current medication:  carbidopa-levodopa 25/100mg  1.5 tablets three times daily (before 8AM, 2 PM, 10PM.   ?  ?She feels that she isn't moving as well since last visit.   ?  ?  ?HISTORY: ?Since summer 2018, she has had trouble with her penmanship.  She states it is more difficult to write and that her handwriting is smaller.  She denies cramping. ?  ?In early 2019, she started having increased problems with balance.  She needs to push off in order to stand.  She feels weak in the legs.  She denies back pain or radicular pain and numbness in the feet.  She denies neck pain.  She denies freezing when initiating taking a step.  She denies dizziness or lightheadedness.  She was started on Sinemet in late 2019 and she noted some improvement in movement.  Due to presence of Babinski, she had an MRI of brain on 10/19/2018 which showed no acute abnormality, and MRI of cervical spine on 11/04/2018 which showed arthritic changes but no significant stenosis causing myelopathy ?  ?No history of REM sleep behavior disorder.  There is no family history of PD. ? ?PAST MEDICAL HISTORY: ?Past Medical History:  ?Diagnosis Date  ? Acute renal failure superimposed on stage 3 chronic kidney disease ([place]) 01/31/2020  ? Breast cancer ([place]) 2012  ? Cancer of central portion of right female breast ([place]) 09/04/2010  ? Patient  has a long history of fibrocystic disease. 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 She underwent right total mastectomy with sentinel node biopsy and left total (prophylactic) mastectomy on 10/12/2010. She underwent immediate reconstruction with expander and AlloDerm placement. 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 One lymph  ? Complicated UTI (urinary tract infection) 01/31/2020  ? Diabetes mellitus   ? Dizziness 02/24/2019  ? Elevated LFTs 08/10/2014  ? Episodic lightheadedness 08/27/2019  ? Esophageal reflux 05/06/2008  ? Essential hypertension 02/16/2009  ? Family history of breast cancer   ? Family history of ovarian cancer   ? Fatigue 02/24/2019  ? Fatty liver 08/05/2015  ? Genetic testing 09/04/2017  ? Negative genetic testing on the common hereditary cancer panel.  The Hereditary Gene Panel offered by [facility] includes sequencing and/or deletion duplication testing of the following 47 genes: APC, ATM, AXIN2, BARD1, BMPR1A, BRCA1, BRCA2, BRIP1, CDH1, CDK4, CDKN2A (p14ARF), CDKN2A (p16INK4a), CHEK2, CTNNA1, DICER1, EPCAM (Deletion/duplication testing only), GREM1 (promoter region deletion/duplicat  ? GERD (gastroesophageal reflux disease)   ? [name] syndrome   ? GILBERT'S SYNDROME 02/06/2007  ? Qualifier: Diagnosis of  By: Janelle Floor    ? History of breast cancer 08/04/2015  ? S/p b/l mastectomy  ? History of colonic polyps 06/06/2010  ? Annotation: destroyed, no clear adenomatous proven Qualifier: Diagnosis of  By: Carlean Purl MD, Dimas Millin  2008 polyp 2013 neg    ? Hypertension   ? Hypertriglyceridemia 09/30/2007  ?  statin intolerant  Father MI @ 3 Sister CVA > 22   ? Hypokalemia due to inadequate potassium intake 01/31/2020  ? IBS (irritable bowel syndrome)   ? Joint pain 09/02/2010  ? Lactic acidosis 01/31/2020  ? Osteoarthritis of left knee 08/04/2015  ? Significant arthritis in knees, limits her activity   ? Osteopenia 08/31/2013  ? [facility]  DEXA  03/20/2018: Osteopenia:  LFN -1.3, R--1.0, spine -0.6-statistically significant increase in BMD in all areas  Findings 08/22/13 : lowest T score -  1.3  @  R femoral neck (hip) ,3.4  %loss @ R femur &   4.1 % in spine   since  2012 Dexa 01/05/16: lowest  - L femur neck T -1.6 Diagnosis: mild Osteopenia Rx: Fosamax remotely; now on Arimidex   ? Parkinsonian features 01/31/2020  ? Peripheral neuropathy 08/23/2018  ? Plantar fasciitis of right foot   ? Dr Oneta Rack  ? Poor balance 02/20/2018  ? S/P bilateral mastectomy 08/04/2015  ? Septic shock ([place]) 01/31/2020  ? Severe sepsis with acute organ dysfunction due to gram-negative bacteria ([place]) 01/31/2020  ? Transfusion history 1976  ? Type 2 diabetes mellitus with stage 3 chronic kidney disease, without long-term current use of insulin ([place]) 08/11/2008  ? Ophth, Dr Kathrin Penner: no retinopathy  Diabetes maternal grandmother  ? ? ?MEDICATIONS: ?Current Outpatient Medications on File Prior to Visit  ?Medication Sig Dispense Refill  ? amLODipine (NORVASC) 5 MG tablet TAKE ONE TABLET ONCE DAILY 30 tablet 3  ? aspirin 81 MG chewable tablet Chew 1 tablet (81 mg total) by mouth daily. 30 tablet 0  ? carbidopa-levodopa (SINEMET IR) 25-100 MG tablet TAKE 1 & 1/2 TABLETS BY MOUTH THREE TIMES DAILY 135 tablet 2  ? carvedilol (COREG) 25 MG tablet TAKE 1 TABLET BY MOUTH TWICE DAILY WITH A MEAL. 60 tablet 5  ? COVID-19 mRNA bivalent vaccine, Pfizer, (PFIZER COVID-19 VAC BIVALENT) injection Inject into the muscle. 0.3 mL 0  ? Cyanocobalamin (VITAMIN B-12) 5000 MCG SUBL Place 5,000 mcg under the tongue daily.     ? metFORMIN (GLUCOPHAGE XR) 750 MG 24 hr tablet Take 2 tablets (1,500 mg total) by mouth daily with breakfast. 180 tablet 1  ? Multiple Vitamin (MULTIVITAMIN WITH MINERALS) TABS tablet Take 1 tablet by mouth daily.    ? Omega-3 Fatty Acids (FISH OIL CONCENTRATE) 1000 MG CAPS Take 1,000 mg by mouth 2 (two) times daily.    ? ?No current facility-administered medications on file prior to  visit.  ? ? ?ALLERGIES: ?Allergies  ?Allergen Reactions  ? Elemental Sulfur   ?  Flushed, funny feeling in throat   ? Exemestane Nausea Only  ?  Other reaction(s): Dizziness (intolerance)  ? Morphine And Related   ? Statins Other (See Comments)  ?  Elevated LFTs  ? Ace Inhibitors   ?  REACTION: COUGH  ? Codeine   ?  REACTION: VOMITTING  ? ? ?FAMILY HISTORY: ?Family History  ?Problem Relation Age of Onset  ? Heart attack Father 24  ? Breast cancer Sister 21  ?      bilateral   ? Heart failure Sister   ?     PMH intensive chemotherapy  ? Hypertension Sister   ? Stroke Sister 16  ? Stroke Mother   ?     TMI  ? Breast cancer Maternal Aunt 39  ? Diabetes Maternal Grandmother   ? Ovarian cancer Paternal Grandmother   ? Breast cancer Maternal Aunt 77  ? Colon cancer  Neg Hx   ? Stomach cancer Neg Hx   ? Esophageal cancer Neg Hx   ? Pancreatic cancer Neg Hx   ? Liver disease Neg Hx   ? ? ?  ?Objective:  ?Blood pressure (!) 190/70, pulse 77, height $RemoveBe'5\' 4"'JaycmeAjS$  (1.626 m), weight 154 lb (69.9 kg), SpO2 97 %. ?General: No acute distress.  Patient appears well-groomed.   ?Head:  Normocephalic/atraumatic ?Eyes:  Fundi examined but not visualized ?Heart:  Regular rate and rhythm ?Neurological Exam: alert and oriented to person, place, and time. Speech fluent and not dysarthric, language intact.  Hypomimia. Hypophonia.  Reduced upward gaze.  Otherwise, CN II-XII intact. No increased tone or cogwheel rigidity appreciated muscle strength 5-/5 throughout.  Bradykinesia.  Reduced finger-thumb tapping speed and amplitude.  No tremor.  Sensation to light touch intact.  Deep tendon reflexes absent throughout  Finger to nose testing intact.  Ambulates with walker.  Picks up feet well with walker but hesitant to walk without assistance.  3 point turn with walker.  Romberg with mild sway. ? ? ?Metta Clines, DO ? ?CC: Billey Gosling, MD ? ? ? ? ? ? ?

## 2022-01-16 ENCOUNTER — Ambulatory Visit: Payer: Medicare Other | Admitting: Neurology

## 2022-01-16 ENCOUNTER — Encounter: Payer: Self-pay | Admitting: Neurology

## 2022-01-16 VITALS — BP 190/70 | HR 77 | Ht 64.0 in | Wt 154.0 lb

## 2022-01-16 DIAGNOSIS — G20 Parkinson's disease: Secondary | ICD-10-CM

## 2022-01-16 DIAGNOSIS — I10 Essential (primary) hypertension: Secondary | ICD-10-CM

## 2022-01-16 MED ORDER — CARBIDOPA-LEVODOPA 25-100 MG PO TABS: 1.5000 | ORAL_TABLET | Freq: Four times a day (QID) | ORAL | 5 refills | Status: DC

## 2022-01-16 NOTE — Patient Instructions (Signed)
Increase carbidopa-levadopa to 1 1/2 tablets four times daily (at 8AM, 12 PM, 4 PM and 10 PM) ?Follow up 6 months. ?

## 2022-01-24 ENCOUNTER — Encounter: Payer: Self-pay | Admitting: Internal Medicine

## 2022-01-24 NOTE — Patient Instructions (Addendum)
? ? ? ?  Blood work was ordered.   ? ? ?Medications changes include :   none ? ? ?Your prescription(s) have been sent to your pharmacy.  ? ? ? ?Return in about 6 months (around 07/28/2022) for Physical Exam. ? ?

## 2022-01-24 NOTE — Progress Notes (Signed)
? ? ? ? ?Subjective:  ? ? Patient ID: [name], female    DOB: 1941-03-16, 81 y.o.   MRN: [id number] ? ? ? ? ?HPI ?[name] is here for follow up of her chronic medical problems, including DM, htn, hld, afib, ckd, parkinson's dz ? ?She is taking her medications daily as prescribed.  She is walking in the house and walking outside.  ? ?She fell one week ago in the bathroom.  She bruised her left lower leg.  She did have her walker and always has a walker.  She was able to catch herself so she did not fall too bad. ? ? ? ? ?Medications and allergies reviewed with patient and updated if appropriate. ? ?Current Outpatient Medications on File Prior to Visit  ?Medication Sig Dispense Refill  ? aspirin 81 MG chewable tablet Chew 1 tablet (81 mg total) by mouth daily. 30 tablet 0  ? carbidopa-levodopa (SINEMET IR) 25-100 MG tablet Take 1.5 tablets by mouth 4 (four) times daily. 180 tablet 5  ? Cyanocobalamin (VITAMIN B-12) 5000 MCG SUBL Place 5,000 mcg under the tongue daily.     ? Multiple Vitamin (MULTIVITAMIN WITH MINERALS) TABS tablet Take 1 tablet by mouth daily.    ? Omega-3 Fatty Acids (FISH OIL CONCENTRATE) 1000 MG CAPS Take 1,000 mg by mouth 2 (two) times daily.    ? ?No current facility-administered medications on file prior to visit.  ? ? ? ?Review of Systems  ?Constitutional:  Negative for fever.  ?Respiratory:  Negative for cough, shortness of breath and wheezing.   ?Cardiovascular:  Negative for chest pain, palpitations and leg swelling.  ?Gastrointestinal:  Negative for abdominal pain and nausea.  ?     No gerd  ?Neurological:  Negative for light-headedness and headaches.  ? ?   ?Objective:  ? ?Vitals:  ? 01/25/22 1040  ?BP: 138/62  ?Pulse: 77  ?Temp: 98.2 ?F (36.8 ?C)  ?SpO2: 96%  ? ?BP Readings from Last 3 Encounters:  ?01/25/22 138/62  ?01/16/22 (!) 190/70  ?07/28/21 130/88  ? ?Wt Readings from Last 3 Encounters:  ?01/25/22 154 lb (69.9 kg)  ?01/16/22 154 lb (69.9 kg)  ?07/28/21 160 lb (72.6 kg)   ? ?Body mass index is 26.43 kg/m?. ? ?  ?Physical Exam ?Constitutional:   ?   General: She is not in acute distress. ?   Appearance: Normal appearance.  ?HENT:  ?   Head: Normocephalic and atraumatic.  ?Eyes:  ?   Conjunctiva/sclera: Conjunctivae normal.  ?Cardiovascular:  ?   Rate and Rhythm: Normal rate and regular rhythm.  ?   Heart sounds: Normal heart sounds. No murmur heard. ?Pulmonary:  ?   Effort: Pulmonary effort is normal. No respiratory distress.  ?   Breath sounds: Normal breath sounds. No wheezing.  ?Musculoskeletal:  ?   Cervical back: Neck supple.  ?   Right lower leg: Edema (Trace) present.  ?   Left lower leg: Edema (Trace) present.  ?Lymphadenopathy:  ?   Cervical: No cervical adenopathy.  ?Skin: ?   General: Skin is warm and dry.  ?   Findings: Bruising (Left lower medial leg from recent fall) present. No rash.  ?Neurological:  ?   Mental Status: She is alert. Mental status is at baseline.  ?Psychiatric:     ?   Mood and Affect: Mood normal.     ?   Behavior: Behavior normal.  ? ?   ? ?Lab Results  ?Component Value Date  ?  WBC 6.7 07/28/2021  ? HGB 13.9 07/28/2021  ? HCT 41.0 07/28/2021  ? PLT 183.0 07/28/2021  ? GLUCOSE 124 (H) 07/28/2021  ? CHOL 161 07/28/2021  ? TRIG 169.0 (H) 07/28/2021  ? HDL 40.20 07/28/2021  ? LDLDIRECT 108.0 08/15/2017  ? [place] 87 07/28/2021  ? ALT 30 07/28/2021  ? AST 26 07/28/2021  ? NA 140 07/28/2021  ? K 4.1 07/28/2021  ? CL 104 07/28/2021  ? CREATININE 0.98 07/28/2021  ? BUN 18 07/28/2021  ? CO2 30 07/28/2021  ? TSH 2.102 02/02/2020  ? INR 1.1 (H) 04/27/2020  ? HGBA1C 6.1 07/28/2021  ? MICROALBUR 1.1 07/28/2021  ? ? ? ?Assessment & Plan:  ? ? ?See Problem List for Assessment and Plan of chronic medical problems.  ? ? ?

## 2022-01-25 ENCOUNTER — Ambulatory Visit (INDEPENDENT_AMBULATORY_CARE_PROVIDER_SITE_OTHER): Payer: Medicare Other | Admitting: Internal Medicine

## 2022-01-25 VITALS — BP 138/62 | HR 77 | Temp 98.2°F | Ht 64.0 in | Wt 154.0 lb

## 2022-01-25 DIAGNOSIS — I10 Essential (primary) hypertension: Secondary | ICD-10-CM

## 2022-01-25 DIAGNOSIS — E1122 Type 2 diabetes mellitus with diabetic chronic kidney disease: Secondary | ICD-10-CM

## 2022-01-25 DIAGNOSIS — E781 Pure hyperglyceridemia: Secondary | ICD-10-CM

## 2022-01-25 DIAGNOSIS — G20 Parkinson's disease: Secondary | ICD-10-CM | POA: Diagnosis not present

## 2022-01-25 DIAGNOSIS — I480 Paroxysmal atrial fibrillation: Secondary | ICD-10-CM | POA: Diagnosis not present

## 2022-01-25 DIAGNOSIS — I700 Atherosclerosis of aorta: Secondary | ICD-10-CM

## 2022-01-25 DIAGNOSIS — N1831 Chronic kidney disease, stage 3a: Secondary | ICD-10-CM

## 2022-01-25 LAB — COMPREHENSIVE METABOLIC PANEL
ALT: 6 U/L (ref 0–35)
AST: 13 U/L (ref 0–37)
Albumin: 4 g/dL (ref 3.5–5.2)
Alkaline Phosphatase: 60 U/L (ref 39–117)
BUN: 19 mg/dL (ref 6–23)
CO2: 28 mEq/L (ref 19–32)
Calcium: 9.4 mg/dL (ref 8.4–10.5)
Chloride: 104 mEq/L (ref 96–112)
Creatinine, Ser: 1.06 mg/dL (ref 0.40–1.20)
GFR: 49.68 mL/min — ABNORMAL LOW (ref >60.00)
Glucose, Bld: 132 mg/dL — ABNORMAL HIGH (ref 70–99)
Potassium: 4.1 mEq/L (ref 3.5–5.1)
Sodium: 139 mEq/L (ref 135–145)
Total Bilirubin: 1.7 mg/dL — ABNORMAL HIGH (ref 0.2–1.2)
Total Protein: 6.7 g/dL (ref 6.0–8.3)

## 2022-01-25 LAB — CBC WITH DIFFERENTIAL/PLATELET
Basophils Absolute: 0 10*3/uL (ref 0.0–0.1)
Basophils Relative: 0.4 % (ref 0.0–3.0)
Eosinophils Absolute: 0.1 10*3/uL (ref 0.0–0.7)
Eosinophils Relative: 2 % (ref 0.0–5.0)
HCT: 41.4 % (ref 36.0–46.0)
Hemoglobin: 13.9 g/dL (ref 12.0–15.0)
Lymphocytes Relative: 25.3 % (ref 12.0–46.0)
Lymphs Abs: 1.5 10*3/uL (ref 0.7–4.0)
MCHC: 33.6 g/dL (ref 30.0–36.0)
MCV: 85.9 fl (ref 78.0–100.0)
Monocytes Absolute: 0.6 10*3/uL (ref 0.1–1.0)
Monocytes Relative: 10.6 % (ref 3.0–12.0)
Neutro Abs: 3.7 10*3/uL (ref 1.4–7.7)
Neutrophils Relative %: 61.7 % (ref 43.0–77.0)
Platelets: 159 10*3/uL (ref 150.0–400.0)
RBC: 4.82 Mil/uL (ref 3.87–5.11)
RDW: 14.1 % (ref 11.5–15.5)
WBC: 6 10*3/uL (ref 4.0–10.5)

## 2022-01-25 LAB — HEMOGLOBIN A1C: Hgb A1c MFr Bld: 5.7 % (ref 4.6–6.5)

## 2022-01-25 MED ORDER — AMLODIPINE BESYLATE 5 MG PO TABS: 5.0000 mg | ORAL_TABLET | Freq: Every day | ORAL | 1 refills | Status: DC

## 2022-01-25 MED ORDER — METFORMIN HCL ER 750 MG PO TB24: 1500.0000 mg | ORAL_TABLET | Freq: Every day | ORAL | 1 refills | Status: DC

## 2022-01-25 MED ORDER — CARVEDILOL 25 MG PO TABS: 25.0000 mg | ORAL_TABLET | Freq: Two times a day (BID) | ORAL | 1 refills | Status: DC

## 2022-01-25 NOTE — Assessment & Plan Note (Signed)
Chronic Blood pressure well controlled CMP Continue amlodipine 5 mg daily, Coreg 25 mg twice daily 

## 2022-01-25 NOTE — Assessment & Plan Note (Signed)
Chronic ?Mild hypertriglyceridemia with blood work last fall ?Statin intolerant ?Controlling with lifestyle ? ?

## 2022-01-25 NOTE — Assessment & Plan Note (Signed)
Chronic ?Mild, stage IIIa ?CMP ?

## 2022-01-25 NOTE — Assessment & Plan Note (Signed)
Chronic ?Statin intolerant ?Encouraged her to continue regular exercise and healthy diet ? ?

## 2022-01-25 NOTE — Assessment & Plan Note (Signed)
Chronic ?Following with cardiology ?On Coreg 25 mg twice daily, aspirin 81 mg daily ?CBC, CMP ?

## 2022-01-25 NOTE — Assessment & Plan Note (Signed)
Chronic ?Lab Results  ?Component Value Date  ? HGBA1C 6.1 07/28/2021  ? ?Sugars well controlled ?Check A1c ?Continue metformin XR 1500 mg daily with breakfast ?Stressed regular exercise, diabetic diet ?Will adjust medication as needed ? ?

## 2022-01-25 NOTE — Assessment & Plan Note (Signed)
Chronic ?Management per Dr. Tomi Likens ?On Sinemet ?

## 2022-03-23 ENCOUNTER — Ambulatory Visit
Admission: RE | Admit: 2022-03-23 | Discharge: 2022-03-23 | Disposition: A | Discharge status: Home / Self Care | Payer: Medicare Other | Source: Ambulatory Visit | Attending: Internal Medicine | Admitting: Internal Medicine

## 2022-03-23 DIAGNOSIS — Z780 Asymptomatic menopausal state: Secondary | ICD-10-CM

## 2022-05-18 DIAGNOSIS — D225 Melanocytic nevi of trunk: Secondary | ICD-10-CM | POA: Diagnosis not present

## 2022-05-18 DIAGNOSIS — D045 Carcinoma in situ of skin of trunk: Secondary | ICD-10-CM | POA: Diagnosis not present

## 2022-05-18 DIAGNOSIS — B078 Other viral warts: Secondary | ICD-10-CM | POA: Diagnosis not present

## 2022-05-18 DIAGNOSIS — L821 Other seborrheic keratosis: Secondary | ICD-10-CM | POA: Diagnosis not present

## 2022-05-18 DIAGNOSIS — D485 Neoplasm of uncertain behavior of skin: Secondary | ICD-10-CM | POA: Diagnosis not present

## 2022-07-12 NOTE — Progress Notes (Signed)
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  She has not responded to levodopa.  She has reduced upward gaze, endorses mild difficulty swallowing and tends to fall backward.   Depression   Her main issue is her depression which is also keeping her from exercising.  Will start escitalopram 22m daily. Carbidopa-levodopa 25/108m1.5 tablets four times daly (8AM, 12PM, 4PM, 10PM) Continue daily exercises Follow up 4-5 months.   Subjective:  [name] a [age]ear old female with diabetes, hypertension, Gilbert's syndrome, and irritable bowel syndrome who follows up for Parkinson's disease  She is accompanied by her husband who supplements history.   UPDATE: Current medication:  carbidopa-levodopa 25/10034m.5 tablets four times daily (8AM, 2 PM, 4PM, 10PM)   Increased frequency of levodopa last visit from three to four times daily.  Denies any improvement with gait.  Now she reports freezing, difficulty getting started by moving her foot to walk.  Tends to fall backwards, not forward.  Some mild difficulty swallowing.  No tremor.  Denies double vision.  Writing is still very small an difficult to read.  Notes more word-finding difficulty.  She hasn't been exercising regularly.  She doesn't go to rehab because it irritates her.  She naps during the day.  She says she sleeps because "it is easier than dealing with this".  Sleeps through the night.       HISTORY: Since summer 2018, she has had trouble with her penmanship.  She states it is more difficult to write and that her handwriting is smaller.  She denies cramping.   In early 2019, she started having increased problems with balance.  She needs to push off in order to stand.  She feels weak in the legs.  She denies back pain or radicular pain and numbness in the feet.  She denies neck  pain.  She denies freezing when initiating taking a step.  She denies dizziness or lightheadedness.  She was started on Sinemet in late 2019 and she noted some improvement in movement.  Due to presence of Babinski, she had an MRI of brain on 10/19/2018 which showed no acute abnormality, and MRI of cervical spine on 11/04/2018 which showed arthritic changes but no significant stenosis causing myelopathy   No history of REM sleep behavior disorder.  There is no family history of PD.  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 She underwent right total mastectomy with sentinel node biopsy and left total (prophylactic) mastectomy on 10/12/2010. She underwent immediate reconstruction with expander and AlloDerm placement. 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 One lymph   Complicated UTI (urinary tract infection) 01/31/2020   Diabetes mellitus    Dizziness 02/24/2019   Elevated LFTs 08/10/2014   Episodic lightheadedness 08/27/2019   Esophageal reflux 05/06/2008   Essential hypertension 02/16/2009   Family history of breast cancer    Family history of ovarian cancer    Fatigue 02/24/2019   Fatty liver 08/05/2015   Genetic testing 09/04/2017   Negative  genetic testing on the common hereditary cancer panel.  The Hereditary Gene Panel offered by [facility] includes sequencing and/or deletion duplication testing of the following 47 genes: APC, ATM, AXIN2, BARD1, BMPR1A, BRCA1, BRCA2, BRIP1, CDH1, CDK4, CDKN2A (p14ARF), CDKN2A (p16INK4a), CHEK2, CTNNA1, DICER1, EPCAM (Deletion/duplication testing only), GREM1 (promoter region  deletion/duplicat   GERD (gastroesophageal reflux disease)    [name] syndrome    GILBERT'S SYNDROME 02/06/2007   Qualifier: Diagnosis of  By: Janelle Floor     History of breast cancer 08/04/2015   S/p b/l mastectomy   History of colonic polyps 06/06/2010   Annotation: destroyed, no clear adenomatous proven Qualifier: Diagnosis of  By: Carlean Purl MD, Tonna Boehringer E  2008 polyp 2013 neg     Hypertension    Hypertriglyceridemia 09/30/2007   statin intolerant  Father MI @ 51 Sister CVA > 65    Hypokalemia due to inadequate potassium intake 01/31/2020   IBS (irritable bowel syndrome)    Joint pain 09/02/2010   Lactic acidosis 01/31/2020   Osteoarthritis of left knee 08/04/2015   Significant arthritis in knees, limits her activity    Osteopenia 08/31/2013   [facility]  DEXA 03/20/2018: Osteopenia:  LFN -1.3, R--1.0, spine -0.6-statistically significant increase in BMD in all areas  Findings 08/22/13 : lowest T score -  1.3  @  R femoral neck (hip) ,3.4  %loss @ R femur &   4.1 % in spine   since  2012 Dexa 01/05/16: lowest  - L femur neck T -1.6 Diagnosis: mild Osteopenia Rx: Fosamax remotely; now on Arimidex    Parkinsonian features 01/31/2020   Peripheral neuropathy 08/23/2018   Plantar fasciitis of right foot    Dr Oneta Rack   Poor balance 02/20/2018   S/P bilateral mastectomy 08/04/2015   Septic shock ([place]) 01/31/2020   Severe sepsis with acute organ dysfunction due to gram-negative bacteria ([place]) 01/31/2020   Transfusion history 1976   Type 2 diabetes mellitus with stage 3 chronic kidney disease, without long-term current use of insulin ([place]) 08/11/2008   Ophth, Dr Kathrin Penner: no retinopathy  Diabetes maternal grandmother    MEDICATIONS: Current Outpatient Medications on File Prior to Visit  Medication Sig Dispense Refill   amLODipine (NORVASC) 5 MG tablet Take 1 tablet (5 mg total) by mouth daily. 90 tablet 1   aspirin 81 MG chewable tablet Chew 1 tablet (81 mg total) by mouth daily. 30 tablet 0    carbidopa-levodopa (SINEMET IR) 25-100 MG tablet Take 1.5 tablets by mouth 4 (four) times daily. 180 tablet 5   carvedilol (COREG) 25 MG tablet Take 1 tablet (25 mg total) by mouth 2 (two) times daily with a meal. 180 tablet 1   Cyanocobalamin (VITAMIN B-12) 5000 MCG SUBL Place 5,000 mcg under the tongue daily.      metFORMIN (GLUCOPHAGE XR) 750 MG 24 hr tablet Take 2 tablets (1,500 mg total) by mouth daily with breakfast. 180 tablet 1   Multiple Vitamin (MULTIVITAMIN WITH MINERALS) TABS tablet Take 1 tablet by mouth daily.     Omega-3 Fatty Acids (FISH OIL CONCENTRATE) 1000 MG CAPS Take 1,000 mg by mouth 2 (two) times daily.     No current facility-administered medications on file prior to visit.    ALLERGIES: Allergies  Allergen Reactions   Elemental Sulfur     Flushed, funny feeling in throat    Exemestane Nausea Only    Other reaction(s): Dizziness (intolerance)   Morphine And Related    Statins Other (See Comments)  Elevated LFTs   Ace Inhibitors     REACTION: COUGH   Codeine     REACTION: VOMITTING    FAMILY HISTORY: Family History  Problem Relation Age of Onset   Heart attack Father 66   Breast cancer Sister 55        bilateral    Heart failure Sister        PMH intensive chemotherapy   Hypertension Sister    Stroke Sister 81   Stroke Mother        TMI   Breast cancer Maternal Aunt 40   Diabetes Maternal Grandmother    Ovarian cancer Paternal Grandmother    Breast cancer Maternal Aunt 77   Colon cancer Neg Hx    Stomach cancer Neg Hx    Esophageal cancer Neg Hx    Pancreatic cancer Neg Hx    Liver disease Neg Hx       Objective:  Blood pressure (!) 140/70, pulse 70, height _0  (1.626 m), weight 153 lb (69.4 kg), SpO2 96 %. General: No acute distress.  Patient appears well-groomed.   Head:  Normocephalic/atraumatic Eyes:  Fundi examined but not visualized Neck: supple, no paraspinal tenderness, full range of motion Heart:  Regular rate and  rhythm Neurological Exam: alert and oriented to person, place, and time.  Speech fluent and not dysarthric, language intact.  Hypomimia.  Hypophonia.  Reduced upward gaze.  Otherwise, CN II-XII intact.  No increased tone or cogwheel rigidity appreciated.  Muscle strength 5-/5 throughout.  Bradykinesia.  Reduced finger-thumb tapping speed and amplitude bilaterally.  No tremor.  Sensation to light touch intact.  Ambulates with walker.  Broad-based gait, does not pick up feet well.  4 point en-bloc turn with walker.  Romberg with mild sway.    Metta Clines, DO  CC: Billey Gosling, MD

## 2022-07-17 ENCOUNTER — Other Ambulatory Visit (HOSPITAL_BASED_OUTPATIENT_CLINIC_OR_DEPARTMENT_OTHER): Payer: Self-pay

## 2022-07-17 ENCOUNTER — Encounter: Payer: Self-pay | Admitting: Neurology

## 2022-07-17 ENCOUNTER — Ambulatory Visit: Payer: Medicare Other | Admitting: Neurology

## 2022-07-17 VITALS — BP 140/70 | HR 70 | Ht 64.0 in | Wt 153.0 lb

## 2022-07-17 DIAGNOSIS — F32A Depression, unspecified: Secondary | ICD-10-CM | POA: Diagnosis not present

## 2022-07-17 DIAGNOSIS — G20C Parkinsonism, unspecified: Secondary | ICD-10-CM

## 2022-07-17 MED ORDER — ESCITALOPRAM OXALATE 5 MG PO TABS: 5.0000 mg | ORAL_TABLET | Freq: Every day | ORAL | 5 refills | Status: DC

## 2022-07-17 MED ORDER — COMIRNATY 30 MCG/0.3ML IM SUSY
PREFILLED_SYRINGE | INTRAMUSCULAR | 0 refills | Status: DC
  Filled 2022-07-17: qty 0.3, 1d supply, fill #0

## 2022-07-17 MED ORDER — INFLUENZA VAC A&B SA ADJ QUAD 0.5 ML IM PRSY
PREFILLED_SYRINGE | INTRAMUSCULAR | 0 refills | Status: DC
  Filled 2022-07-17: qty 0.5, 1d supply, fill #0

## 2022-07-17 NOTE — Patient Instructions (Signed)
Start escitalopram '5mg'$  daily Continue levodopa as directed Continue regular exercise Follow up 4-5 months.

## 2022-07-20 ENCOUNTER — Ambulatory Visit: Payer: Medicare Other | Admitting: Neurology

## 2022-07-21 ENCOUNTER — Emergency Department (HOSPITAL_COMMUNITY)
Admission: EM | Admit: 2022-07-21 | Discharge: 2022-07-21 | Disposition: A | Discharge status: Home / Self Care | Payer: Medicare Other | Attending: Emergency Medicine | Admitting: Emergency Medicine

## 2022-07-21 ENCOUNTER — Other Ambulatory Visit: Payer: Self-pay

## 2022-07-21 ENCOUNTER — Emergency Department (HOSPITAL_COMMUNITY): Payer: Medicare Other

## 2022-07-21 DIAGNOSIS — S0083XA Contusion of other part of head, initial encounter: Secondary | ICD-10-CM | POA: Insufficient documentation

## 2022-07-21 DIAGNOSIS — I10 Essential (primary) hypertension: Secondary | ICD-10-CM | POA: Diagnosis not present

## 2022-07-21 DIAGNOSIS — W01198A Fall on same level from slipping, tripping and stumbling with subsequent striking against other object, initial encounter: Secondary | ICD-10-CM | POA: Insufficient documentation

## 2022-07-21 DIAGNOSIS — Z7982 Long term (current) use of aspirin: Secondary | ICD-10-CM | POA: Insufficient documentation

## 2022-07-21 DIAGNOSIS — Z79899 Other long term (current) drug therapy: Secondary | ICD-10-CM | POA: Insufficient documentation

## 2022-07-21 DIAGNOSIS — S0990XA Unspecified injury of head, initial encounter: Secondary | ICD-10-CM | POA: Diagnosis present

## 2022-07-21 DIAGNOSIS — G20A1 Parkinson's disease without dyskinesia, without mention of fluctuations: Secondary | ICD-10-CM | POA: Insufficient documentation

## 2022-07-21 DIAGNOSIS — S0003XA Contusion of scalp, initial encounter: Secondary | ICD-10-CM | POA: Diagnosis not present

## 2022-07-21 DIAGNOSIS — Z743 Need for continuous supervision: Secondary | ICD-10-CM | POA: Diagnosis not present

## 2022-07-21 DIAGNOSIS — E119 Type 2 diabetes mellitus without complications: Secondary | ICD-10-CM | POA: Insufficient documentation

## 2022-07-21 DIAGNOSIS — R55 Syncope and collapse: Secondary | ICD-10-CM | POA: Insufficient documentation

## 2022-07-21 DIAGNOSIS — R1111 Vomiting without nausea: Secondary | ICD-10-CM | POA: Diagnosis not present

## 2022-07-21 DIAGNOSIS — R6889 Other general symptoms and signs: Secondary | ICD-10-CM | POA: Diagnosis not present

## 2022-07-21 DIAGNOSIS — R404 Transient alteration of awareness: Secondary | ICD-10-CM | POA: Diagnosis not present

## 2022-07-21 DIAGNOSIS — R110 Nausea: Secondary | ICD-10-CM | POA: Diagnosis not present

## 2022-07-21 DIAGNOSIS — Y92002 Bathroom of unspecified non-institutional (private) residence single-family (private) house as the place of occurrence of the external cause: Secondary | ICD-10-CM | POA: Diagnosis not present

## 2022-07-21 DIAGNOSIS — N390 Urinary tract infection, site not specified: Secondary | ICD-10-CM | POA: Diagnosis not present

## 2022-07-21 DIAGNOSIS — M4312 Spondylolisthesis, cervical region: Secondary | ICD-10-CM | POA: Diagnosis not present

## 2022-07-21 LAB — CBC WITH DIFFERENTIAL/PLATELET
Abs Immature Granulocytes: 0.04 10*3/uL (ref 0.00–0.07)
Basophils Absolute: 0 10*3/uL (ref 0.0–0.1)
Basophils Relative: 0 %
Eosinophils Absolute: 0.1 10*3/uL (ref 0.0–0.5)
Eosinophils Relative: 1 %
HCT: 42.4 % (ref 36.0–46.0)
Hemoglobin: 14.1 g/dL (ref 12.0–15.0)
Immature Granulocytes: 0 %
Lymphocytes Relative: 10 %
Lymphs Abs: 1.1 10*3/uL (ref 0.7–4.0)
MCH: 28.7 pg (ref 26.0–34.0)
MCHC: 33.3 g/dL (ref 30.0–36.0)
MCV: 86.2 fL (ref 80.0–100.0)
Monocytes Absolute: 0.7 10*3/uL (ref 0.1–1.0)
Monocytes Relative: 7 %
Neutro Abs: 8.4 10*3/uL — ABNORMAL HIGH (ref 1.7–7.7)
Neutrophils Relative %: 82 %
Platelets: 214 10*3/uL (ref 150–400)
RBC: 4.92 MIL/uL (ref 3.87–5.11)
RDW: 13.7 % (ref 11.5–15.5)
WBC: 10.4 10*3/uL (ref 4.0–10.5)
nRBC: 0 % (ref 0.0–0.2)

## 2022-07-21 LAB — BASIC METABOLIC PANEL
Anion gap: 9 (ref 5–15)
BUN: 14 mg/dL (ref 8–23)
CO2: 24 mmol/L (ref 22–32)
Calcium: 8.9 mg/dL (ref 8.9–10.3)
Chloride: 104 mmol/L (ref 98–111)
Creatinine, Ser: 0.8 mg/dL (ref 0.44–1.00)
GFR, Estimated: >60 mL/min (ref >60)
Glucose, Bld: 145 mg/dL — ABNORMAL HIGH (ref 70–99)
Potassium: 4.5 mmol/L (ref 3.5–5.1)
Sodium: 137 mmol/L (ref 135–145)

## 2022-07-21 LAB — CBG MONITORING, ED: Glucose-Capillary: 157 mg/dL — ABNORMAL HIGH (ref 70–99)

## 2022-07-21 NOTE — ED Provider Notes (Signed)
Patient had unwitnessed syncope in bathroom. Was urinating and having BM, she thinks she might have been straining. Doesn't remember after that. Husband heard her fall and found her on the ground face down. Possible seizure-like activity, mostly with her moving her buttocks/pelvis up and down but was not responding otherwise.  Lasted about 2-3 minutes.  Was then not back to her self for about 5 minutes.  Considered seizure but also syncope, could be multiple causes given her parkinsonism and likely autonomic dysfunction.  Discussed with Dr. Rory Percy about possibility of seizure.  He notes this is definitely possible with her parkinsonism but would not treat immediately.  Can consider nonemergent EEG this certainly could admit overnight for EEG but he does not think it is necessary.  Discussed with husband and wife and discussed possibilities including significant arrhythmia, vagal response to having a bowel movement, autonomic dysfunction causing orthostasis, or seizure.  After discussion and offering to observe overnight with work-up, family has decided to go home.  I do not think is unreasonable.  Needs to follow-up closely with outpatient neurology and PCP.  From an injury perspective no significant injury besides the bruising and abrasions. Will give return precautions.   Sherwood Gambler, MD 07/21/22 2008

## 2022-07-21 NOTE — ED Notes (Signed)
Pt reports falling in the bathroom and hitting her head. Pt reports that she was trying to go to the bathroom when she fell.

## 2022-07-21 NOTE — ED Triage Notes (Signed)
BIBA for syncopal episode when standing up from toilet. Pt reports hitting head with abrasions noted to face.Nausea and vomiting after fall.  Denies blood thinner usage. PERRLA. GCS 15,

## 2022-07-21 NOTE — Discharge Instructions (Addendum)
Please take tylenol/ibuprofen for pain. I recommend close follow-up with PCP for reevaluation.  Please do not hesitate to return to emergency department if worrisome signs symptoms we discussed become apparent.

## 2022-07-21 NOTE — ED Provider Notes (Signed)
[facility] DEPT Provider Note   CSN: [id number] Arrival date & time: 07/21/22  1622     History {Add pertinent medical, surgical, social history, OB history to HPI:1} Chief Complaint  Patient presents with   Loss of Consciousness    [name] is a 81 y.o. female with past medical history of Parkinson, diabetes, hypertension BIB EMS to the emergency room for evaluation after a fall.  History obtained by patient and her husband at bedside.  She had a fall around 3:30 PM today.  She went to the restroom and had a fall not knowing what happened.  Per husband he found his wife on the floor under the walker.  She did not respond for 5 minutes.  She has vomited a few times after the fall. Per husband she has had issues with her balance causing multiple falls in the past.  Denies any chest pain, shortness of breath, bowel changes, urinary symptoms, rash.   Loss of Consciousness      Home Medications Prior to Admission medications   Medication Sig Start Date End Date Taking? Authorizing Provider  amLODipine (NORVASC) 5 MG tablet Take 1 tablet (5 mg total) by mouth daily. 01/25/22   Binnie Rail, MD  aspirin 81 MG chewable tablet Chew 1 tablet (81 mg total) by mouth daily. 02/07/20   Matcha, Beverely Pace, MD  carbidopa-levodopa (SINEMET IR) 25-100 MG tablet Take 1.5 tablets by mouth 4 (four) times daily. 01/16/22   Pieter Partridge, DO  carvedilol (COREG) 25 MG tablet Take 1 tablet (25 mg total) by mouth 2 (two) times daily with a meal. 01/25/22   Burns, Claudina Lick, MD  COVID-19 mRNA vaccine [phone] (COMIRNATY) syringe Inject into the muscle. 07/17/22   Carlyle Basques, MD  Cyanocobalamin (VITAMIN B-12) 5000 MCG SUBL Place 5,000 mcg under the tongue daily.     [provider]  escitalopram (LEXAPRO) 5 MG tablet Take 1 tablet (5 mg total) by mouth daily. 07/17/22   Pieter Partridge, DO  influenza vaccine adjuvanted (FLUAD) 0.5 ML injection Inject into the  muscle. 07/17/22   Carlyle Basques, MD  metFORMIN (GLUCOPHAGE XR) 750 MG 24 hr tablet Take 2 tablets (1,500 mg total) by mouth daily with breakfast. 01/25/22   Binnie Rail, MD  Multiple Vitamin (MULTIVITAMIN WITH MINERALS) TABS tablet Take 1 tablet by mouth daily.    [provider]  Omega-3 Fatty Acids (FISH OIL CONCENTRATE) 1000 MG CAPS Take 1,000 mg by mouth 2 (two) times daily.    [provider]      Allergies    Elemental sulfur, Exemestane, Morphine and related, Statins, Ace inhibitors, and Codeine    Review of Systems   Review of Systems  Cardiovascular:  Positive for syncope.    Physical Exam Updated Vital Signs BP (!) 185/71   Pulse 67   Temp 98 F (36.7 C)   Resp 20   SpO2 98%  Physical Exam Vitals and nursing note reviewed.  Constitutional:      Appearance: Normal appearance.  HENT:     Head: Normocephalic and atraumatic.     Mouth/Throat:     Mouth: Mucous membranes are moist.  Eyes:     General: No scleral icterus. Cardiovascular:     Rate and Rhythm: Normal rate and regular rhythm.     Pulses: Normal pulses.     Heart sounds: Normal heart sounds.  Pulmonary:     Effort: Pulmonary effort is normal.  Breath sounds: Normal breath sounds.  Abdominal:     General: Abdomen is flat.     Palpations: Abdomen is soft.     Tenderness: There is no abdominal tenderness.  Musculoskeletal:        General: No deformity.  Skin:    General: Skin is warm.     Findings: No rash.     Comments: Hematoma to R forehead. Trace blood on th R side of her nose.  Neurological:     General: No focal deficit present.     Mental Status: She is alert.  Psychiatric:        Mood and Affect: Mood normal.     ED Results / Procedures / Treatments   Labs (all labs ordered are listed, but only abnormal results are displayed) Labs Reviewed  BASIC METABOLIC PANEL - Abnormal; Notable for the following components:      Result Value   Glucose, Bld 145 (*)     All other components within normal limits  CBC WITH DIFFERENTIAL/PLATELET - Abnormal; Notable for the following components:   Neutro Abs 8.4 (*)    All other components within normal limits  CBG MONITORING, ED - Abnormal; Notable for the following components:   Glucose-Capillary 157 (*)    All other components within normal limits    EKG None  Radiology CT CERVICAL SPINE WO CONTRAST  Result Date: 07/21/2022 CLINICAL DATA:  Neck trauma, fall. EXAM: CT CERVICAL SPINE WITHOUT CONTRAST TECHNIQUE: Multidetector CT imaging of the cervical spine was performed without intravenous contrast. Multiplanar CT image reconstructions were also generated. RADIATION DOSE REDUCTION: This exam was performed according to the departmental dose-optimization program which includes automated exposure control, adjustment of the mA and/or kV according to patient size and/or use of iterative reconstruction technique. COMPARISON:  Cervical spine MRI 11/04/2018 FINDINGS: Alignment: No traumatic subluxation. There is trace anterolisthesis of C4 on C5. Skull base and vertebrae: No acute fracture. Vertebral body heights are maintained. The dens and skull base are intact. Incidental non fusion posterior arch of C1. There is a small right cervical rib. Soft tissues and spinal canal: No prevertebral fluid or swelling. No visible canal hematoma. Disc levels: Disc space narrowing and spurring at C3-C4, C4-C5, C5-C6 and C6-C7. There is mild multilevel facet hypertrophy. 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 No acute apical findings. Other: Carotid calcifications. IMPRESSION: 1. Multilevel degenerative change in the cervical spine without acute fracture or subluxation. 2. 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 Recommend thyroid ultrasound, 1giving consideration for patient's advanced age (ref: J Am Coll Radiol. 2015 Feb;12(2): 143-50).  Electronically Signed   By: Keith Rake M.D.   On: 07/21/2022 18:21   CT Maxillofacial Wo Contrast  Result Date: 07/21/2022 CLINICAL DATA:  Blunt facial trauma. Fall striking right head and face. EXAM: CT MAXILLOFACIAL WITHOUT CONTRAST TECHNIQUE: Multidetector CT imaging of the maxillofacial structures was performed. Multiplanar CT image reconstructions were also generated. RADIATION DOSE REDUCTION: This exam was performed according to the departmental dose-optimization program which includes automated exposure control, adjustment of the mA and/or kV according to patient size and/or use of iterative reconstruction technique. COMPARISON:  None Available. FINDINGS: Osseous: No acute fracture of the nasal bone, zygomatic arches or mandibles. Intact maxilla and pterygoid plates. Temporomandibular joints are congruent with mild degenerative change. Orbits: No acute orbital fracture. No globe injury. Bilateral lens excision. Sinuses: No sinus fracture or hemosinus. Opacification of a single  posterior right ethmoid air cell. Mild mucosal thickening of right maxillary sinus. Soft tissues: Right supraorbital scalp hematoma. Limited intracranial: Assessed on concurrent head CT, reported separately. IMPRESSION: Right supraorbital scalp hematoma. No acute facial bone fracture. Electronically Signed   By: Keith Rake M.D.   On: 07/21/2022 18:11   CT HEAD WO CONTRAST  Result Date: 07/21/2022 CLINICAL DATA:  Cigarette/presyncope PET-CT vascular cause suspected. Hit right-side of head and face. Loss of consciousness. EXAM: CT HEAD WITHOUT CONTRAST TECHNIQUE: Contiguous axial images were obtained from the base of the skull through the vertex without intravenous contrast. RADIATION DOSE REDUCTION: This exam was performed according to the departmental dose-optimization program which includes automated exposure control, adjustment of the mA and/or kV according to patient size and/or use of iterative reconstruction  technique. COMPARISON:  MRI brain 10/19/2018 FINDINGS: Brain: There is moderate cortical atrophy, unchanged from prior and within normal limits for patient age. The ventricles are normal in configuration. The basilar cisterns are patent. No mass, mass effect, or midline shift. No acute intracranial hemorrhage is seen. No abnormal extra-axial fluid collection. Mild-to-moderate patchy periventricular and subcortical white matter hypodensities, nonspecific but most likely secondary to chronic ischemic white matter changes similar to the increased T2/FLAIR signal on prior MRI. Otherwise, preservation of the normal cortical gray-white interface without CT evidence of an acute major vascular territorial cortical based infarction. Vascular: No hyperdense vessel or unexpected calcification. There are atherosclerotic calcifications within the skull base arteries. Skull: Normal. Negative for fracture or focal lesion. Sinuses/Orbits: Status post bilateral ocular lens replacement. Mild opacification of a posterior right ethmoid air cell. The visualized mastoid air cells are clear. Other: There is a mild hematoma with surrounding soft tissue swelling within the right forehead. Additional mild hematoma measuring up to 8 mm in craniocaudal thickness within the left paramidline, anterior frontal vertex scalp. IMPRESSION: 1. No acute intracranial process. 2. Moderate cortical atrophy and mild-to-moderate chronic ischemic white matter changes. 3. Mild hematoma with surrounding soft tissue swelling within the right forehead. Additional mild hematoma within the left paramidline, anterior frontal vertex scalp. Electronically Signed   By: Yvonne Kendall M.D.   On: 07/21/2022 17:52    Procedures Procedures  {Document cardiac monitor, telemetry assessment procedure when appropriate:1}  Medications Ordered in ED Medications - No data to display  ED Course/ Medical Decision Making/ A&P                           Medical Decision  Making  This patient presents to the ED for post-fall, this involves an extensive number of treatment options, and is a complaint that carries with a high risk of complications and morbidity.  The differential diagnosis includes ICH, seizure, .  This is not an exhaustive list.  Comorbidities that complicate the patient evaluation See HPI  Social determinants of health NA  Additional history obtained: Additional history obtained from EMR. External records from outside source obtained and review including prior labs  Cardiac monitoring/EKG: The patient was maintained on a cardiac monitor.  I personally reviewed and interpreted the cardiac monitor which showed an underlying rhythm of: Sinus rhythm.  Lab tests: I ordered and personally interpreted labs.  The pertinent results include WBC normal. Hbg normal. Platelets normal. No electrolyte abnormalities noted. BUN, creatinine normal. No transaminitis.  Imaging studies: I ordered imaging studies including CT head, maxillofacial, cervical spine which show right supra orbital scalp hematoma.  No acute facial bone fracture.  No acute fracture  or subluxation of the cervical spine. I personally reviewed, interpreted imaging and agree with the radiologist's interpretations.  Problem list/ ED course/ Critical interventions/ Medical management: HPI: See above Vital signs significant for BP 199/83 otherwise within normal range and stable throughout visit. Laboratory/imaging studies significant for: See above. On physical examination, patient is afebrile and appears in no acute distress.  There is a 2 x 2 centimeter hematoma to right forehead.  Trace blood on the side of her nose.  Neuro exam normal. She was moving bilateral upper extremities.  Normal strength throughout.  Pinpoint pupils with intact EOMs.  Cranial nerves II through XII grossly intact.  EKG without any ischemic changes or arrhythmia. Patient's clinical presentations and  laboratory/imaging studies are most concerned for syncope.  Low suspicions for seizure or CVA as CT head negative. Patient is stable to discharge. Advised patient to follow fall prevention . I have reviewed the patient home medicines and have made adjustments as needed.  Consultations obtained: I requested consultation with the attending Dr. Regenia Skeeter, and discussed lab and imaging findings as well as pertinent plan and he agreed with the plan.  Disposition Continued outpatient therapy. Follow-up with PCP recommended for reevaluation of symptoms. Treatment plan discussed with patient.  Pt acknowledged understanding was agreeable to the plan. Worrisome signs and symptoms were discussed with patient, and patient acknowledged understanding to return to the ED if they noticed these signs and symptoms. Patient was stable upon discharge.   This chart was dictated using voice recognition software.  Despite best efforts to proofread,  errors can occur which can change the documentation meaning.    {Document critical care time when appropriate:1} {Document review of labs and clinical decision tools ie heart score, Chads2Vasc2 etc:1}  {Document your independent review of radiology images, and any outside records:1} {Document your discussion with family members, caretakers, and with consultants:1} {Document social determinants of health affecting pt's care:1} {Document your decision making why or why not admission, treatments were needed:1} Final Clinical Impression(s) / ED Diagnoses Final diagnoses:  Syncope, unspecified syncope type  Traumatic hematoma of forehead, initial encounter    Rx / DC Orders ED Discharge Orders     None

## 2022-07-21 NOTE — ED Notes (Signed)
Pt family verbalized understanding of discharge instructions. Pt wheeled from ED. IV access removed. Pt transferred to vehicle family to drive home

## 2022-07-21 NOTE — ED Provider Triage Note (Signed)
Emergency Medicine Provider Triage Evaluation Note  [name] , a 81 y.o. female  was evaluated in triage.  Pt complains of LOC onset PTA.  Notes that she was on the toilet when she had a syncopal episode and hit her head.  Denies feeling dizzy or lightheaded prior to the fall.  Denies anticoagulant use.  Was given antiemetic by EMS prior to arrival.  Has associated nausea.  Denies chest pain, shortness of breath, vomiting, neck pain, back pain.  Review of Systems  Positive:  Negative:   Physical Exam  BP (!) 185/71   Pulse 67   Temp 98 F (36.7 C)   Resp 20   SpO2 98%  Gen:   Awake, no distress   Resp:  Normal effort  MSK:   Moves extremities without difficulty  Other:  Abrasions noted to right side of face without tenderness to palpation.  No spinal tenderness to palpation.  No tenderness to palpation noted to musculature of back.  Medical Decision Making  Medically screening exam initiated at 4:49 PM.  Appropriate orders placed.  [name] was informed that the remainder of the evaluation will be completed by another provider, this initial triage assessment does not replace that evaluation, and the importance of remaining in the ED until their evaluation is complete.  Work-up initiated   Taraya Steward A, PA-C 07/21/22 1650

## 2022-07-24 DIAGNOSIS — Z85828 Personal history of other malignant neoplasm of skin: Secondary | ICD-10-CM | POA: Diagnosis not present

## 2022-07-24 DIAGNOSIS — D045 Carcinoma in situ of skin of trunk: Secondary | ICD-10-CM | POA: Diagnosis not present

## 2022-07-24 DIAGNOSIS — D692 Other nonthrombocytopenic purpura: Secondary | ICD-10-CM | POA: Diagnosis not present

## 2022-07-25 ENCOUNTER — Encounter: Payer: Self-pay | Admitting: *Deleted

## 2022-07-25 ENCOUNTER — Telehealth: Payer: Self-pay | Admitting: *Deleted

## 2022-07-25 NOTE — Patient Outreach (Signed)
  Care Coordination TOC Note Transition Care Management Follow-up Telephone Call Date of discharge and from where: ED EMMI RED notification: discharged from [facility] 07/21/22: syncopal episode with fall; hematoma on forehead; EMMI RE Alert-- "No follow up visit" How have you been since you were released from the hospital? "I am doing fine, not having any problems since then and feel fine; using my rollator walker like I always do, so no falls since then.  I feel back to normal and the hematoma is healing.  I have an appointment with Dr. [name] on this coming Monday" Any questions or concerns? No  Items Reviewed: Did the pt receive and understand the discharge instructions provided? Yes  Medications obtained and verified? Yes - confirms no changes to regular medications; confirms husband assists with medication management Other? No  Any new allergies since your discharge? No  Dietary orders reviewed? Yes Do you have support at home? Yes  husband assists with ADL's and iADL's as indicated/ needed  Home Care and Equipment/Supplies: Were home health services ordered? not applicable If so, what is the name of the agency? N/A  Has the agency set up a time to come to the patient's home? not applicable Were any new equipment or medical supplies ordered?  No What is the name of the medical supply agency? N/A Were you able to get the supplies/equipment? not applicable Do you have any questions related to the use of the equipment or supplies? No N/A  Functional Questionnaire: (I = Independent and D = Dependent) ADLs: I  husband assists with ADL's and iADL's as indicated/ needed  Bathing/Dressing- I  Meal Prep- I  husband assists with ADL's and iADL's as indicated/ needed  Eating- I  Maintaining continence- I  Transferring/Ambulation- I  Managing Meds- I  husband assists with ADL's and iADL's as indicated/ needed  Follow up appointments reviewed:  PCP Hospital f/u appt confirmed? Yes   Scheduled to see Dr. [name], PCP on Monday 07/31/22 @ 10:40 am Specialist Hospital f/u appt confirmed? No  Scheduled to see - on - @ - Are transportation arrangements needed? No  If their condition worsens, is the pt aware to call PCP or go to the Emergency Dept.? Yes Was the patient provided with contact information for the PCP's office or ED? Yes Was to pt encouraged to call back with questions or concerns? Yes  SDOH assessments and interventions completed:   Yes  Care Coordination Interventions Activated:  Yes   Care Coordination Interventions:  Provided verbal and written education via MyChart around signs/ symptoms pre-syncope, along with corresponding action plan; discussed, provided education around fall prevention and confirmed that patient obtained post-ED AVS with written education around fall prevention    Encounter Outcome:  Pt. Visit Completed    Oneta Rack, RN, BSN, CCRN Alumnus RN CM Care Coordination/ Transition of [place] Management [phone]: direct office

## 2022-07-25 NOTE — Patient Instructions (Signed)
Visit Information  Thank you for taking time to visit with me today. Please don't hesitate to contact me if I can be of assistance to you.   I have included the information we discussed today about syncope (fainting), please read over this information when you have a chance  If you are experiencing a Mental Health or [place] or need someone to talk to, please  call the Suicide and Crisis Lifeline: 988 call the [place] National Suicide Prevention Lifeline: (252)689-6539 or TTY: 539-607-2775 TTY (409)764-0560) to talk to a trained counselor call 1-800-273-TALK (toll free, 24 hour hotline) go to [facility] [address] [place] [phone]) call the [place]: [phone] call 911   Patient verbalizes understanding of instructions and care plan provided today and agrees to view in [place]. Active MyChart status and patient understanding of how to access instructions and care plan via MyChart confirmed with patient.     No further follow up required: EMMI RED ED TOC completed; no further or ongoing care coordination needs identified today  Oneta Rack, RN, BSN, CCRN Alumnus RN CM Care Coordination/ Transition of [place] Management [phone]: direct office

## 2022-07-30 ENCOUNTER — Encounter: Payer: Self-pay | Admitting: Internal Medicine

## 2022-07-30 NOTE — Patient Instructions (Addendum)
Some home care agencies  - [facility] or [facility]    Blood work was ordered.   The lab is on the first floor.   Medications changes include :  none    An Ultrasound of your thyroid was ordered.   A bone density was ordered for [facility].     Return in about 6 months (around 01/29/2023) for follow up.    Health Maintenance, Female Adopting a healthy lifestyle and getting preventive care are important in promoting health and wellness. Ask your health care provider about: The right schedule for you to have regular tests and exams. Things you can do on your own to prevent diseases and keep yourself healthy. What should I know about diet, weight, and exercise? Eat a healthy diet  Eat a diet that includes plenty of vegetables, fruits, low-fat dairy products, and lean protein. Do not eat a lot of foods that are high in solid fats, added sugars, or sodium. Maintain a healthy weight Body mass index (BMI) is used to identify weight problems. It estimates body fat based on height and weight. Your health care provider can help determine your BMI and help you achieve or maintain a healthy weight. Get regular exercise Get regular exercise. This is one of the most important things you can do for your health. Most adults should: Exercise for at least 150 minutes each week. The exercise should increase your heart rate and make you sweat (moderate-intensity exercise). Do strengthening exercises at least twice a week. This is in addition to the moderate-intensity exercise. Spend less time sitting. Even light physical activity can be beneficial. Watch cholesterol and blood lipids Have your blood tested for lipids and cholesterol at 80 years of age, then have this test every 5 years. Have your cholesterol levels checked more often if: Your lipid or cholesterol levels are high. You are older than 80 years of age. You are at high risk for heart disease. What should I know about  cancer screening? Depending on your health history and family history, you may need to have cancer screening at various ages. This may include screening for: Breast cancer. Cervical cancer. Colorectal cancer. Skin cancer. Lung cancer. What should I know about heart disease, diabetes, and high blood pressure? Blood pressure and heart disease High blood pressure causes heart disease and increases the risk of stroke. This is more likely to develop in people who have high blood pressure readings or are overweight. Have your blood pressure checked: Every 3-5 years if you are 66-69 years of age. Every year if you are 1 years old or older. Diabetes Have regular diabetes screenings. This checks your fasting blood sugar level. Have the screening done: Once every three years after age 51 if you are at a normal weight and have a low risk for diabetes. More often and at a younger age if you are overweight or have a high risk for diabetes. What should I know about preventing infection? Hepatitis B If you have a higher risk for hepatitis B, you should be screened for this virus. Talk with your health care provider to find out if you are at risk for hepatitis B infection. Hepatitis C Testing is recommended for: Everyone born from [birth year] through 1964. Anyone with known risk factors for hepatitis C. Sexually transmitted infections (STIs) Get screened for STIs, including gonorrhea and chlamydia, if: You are sexually active and are younger than 80 years of age. You are older than 80 years of age  and your health care provider tells you that you are at risk for this type of infection. Your sexual activity has changed since you were last screened, and you are at increased risk for chlamydia or gonorrhea. Ask your health care provider if you are at risk. Ask your health care provider about whether you are at high risk for HIV. Your health care provider may recommend a prescription medicine to help prevent HIV  infection. If you choose to take medicine to prevent HIV, you should first get tested for HIV. You should then be tested every 3 months for as long as you are taking the medicine. Pregnancy If you are about to stop having your period (premenopausal) and you may become pregnant, seek counseling before you get pregnant. Take 400 to 800 micrograms (mcg) of folic acid every day if you become pregnant. Ask for birth control (contraception) if you want to prevent pregnancy. Osteoporosis and menopause Osteoporosis is a disease in which the bones lose minerals and strength with aging. This can result in bone fractures. If you are 86 years old or older, or if you are at risk for osteoporosis and fractures, ask your health care provider if you should: Be screened for bone loss. Take a calcium or vitamin D supplement to lower your risk of fractures. Be given hormone replacement therapy (HRT) to treat symptoms of menopause. Follow these instructions at home: Alcohol use Do not drink alcohol if: Your health care provider tells you not to drink. You are pregnant, may be pregnant, or are planning to become pregnant. If you drink alcohol: Limit how much you have to: 0-1 drink a day. Know how much alcohol is in your drink. In the [place] one drink equals one 12 oz bottle of beer (355 mL), one 5 oz glass of wine (148 mL), or one 1 oz glass of hard liquor (44 mL). Lifestyle Do not use any products that contain nicotine or tobacco. These products include cigarettes, chewing tobacco, and vaping devices, such as e-cigarettes. If you need help quitting, ask your health care provider. Do not use street drugs. Do not share needles. Ask your health care provider for help if you need support or information about quitting drugs. General instructions Schedule regular health, dental, and eye exams. Stay current with your vaccines. Tell your health care provider if: You often feel depressed. You have ever been abused  or do not feel safe at home. Summary Adopting a healthy lifestyle and getting preventive care are important in promoting health and wellness. Follow your health care provider's instructions about healthy diet, exercising, and getting tested or screened for diseases. Follow your health care provider's instructions on monitoring your cholesterol and blood pressure. This information is not intended to replace advice given to you by your health care provider. Make sure you discuss any questions you have with your health care provider. Document Revised: 01/24/2021 Document Reviewed: 01/24/2021 Elsevier Patient Education  [place].

## 2022-07-30 NOTE — Progress Notes (Unsigned)
Subjective:    Patient ID: [name], female    DOB: 06-06-1941, 81 y.o.   MRN: [id number]      HPI Dazja is here for a Physical exam.    Saw neuro - ? Supranuclear palsy not parkinson's.   Medications and allergies reviewed with patient and updated if appropriate.  Current Outpatient Medications on File Prior to Visit  Medication Sig Dispense Refill   amLODipine (NORVASC) 5 MG tablet Take 1 tablet (5 mg total) by mouth daily. 90 tablet 1   aspirin 81 MG chewable tablet Chew 1 tablet (81 mg total) by mouth daily. 30 tablet 0   carbidopa-levodopa (SINEMET IR) 25-100 MG tablet Take 1.5 tablets by mouth 4 (four) times daily. 180 tablet 5   carvedilol (COREG) 25 MG tablet Take 1 tablet (25 mg total) by mouth 2 (two) times daily with a meal. 180 tablet 1   COVID-19 mRNA vaccine 2023-2024 (COMIRNATY) syringe Inject into the muscle. 0.3 mL 0   Cyanocobalamin (VITAMIN B-12) 5000 MCG SUBL Place 5,000 mcg under the tongue daily.      escitalopram (LEXAPRO) 5 MG tablet Take 1 tablet (5 mg total) by mouth daily. 30 tablet 5   influenza vaccine adjuvanted (FLUAD) 0.5 ML injection Inject into the muscle. 0.5 mL 0   metFORMIN (GLUCOPHAGE XR) 750 MG 24 hr tablet Take 2 tablets (1,500 mg total) by mouth daily with breakfast. 180 tablet 1   Multiple Vitamin (MULTIVITAMIN WITH MINERALS) TABS tablet Take 1 tablet by mouth daily.     Omega-3 Fatty Acids (FISH OIL CONCENTRATE) 1000 MG CAPS Take 1,000 mg by mouth 2 (two) times daily.     No current facility-administered medications on file prior to visit.    Review of Systems     Objective:  There were no vitals filed for this visit. There were no vitals filed for this visit. There is no height or weight on file to calculate BMI.  BP Readings from Last 3 Encounters:  07/21/22 (!) 199/83  07/17/22 (!) 140/70  01/25/22 138/62    Wt Readings from Last 3 Encounters:  07/17/22 153 lb (69.4 kg)  01/25/22 154 lb (69.9 kg)   01/16/22 154 lb (69.9 kg)       Physical Exam Constitutional: She appears well-developed and well-nourished. No distress.  HENT:  Head: Normocephalic and atraumatic.  Right Ear: External ear normal. Normal ear canal and TM Left Ear: External ear normal.  Normal ear canal and TM Mouth/Throat: Oropharynx is clear and moist.  Eyes: Conjunctivae normal.  Neck: Neck supple. No tracheal deviation present. No thyromegaly present.  No carotid bruit  Cardiovascular: Normal rate, regular rhythm and normal heart sounds.   No murmur heard.  No edema. Pulmonary/Chest: Effort normal and breath sounds normal. No respiratory distress. She has no wheezes. She has no rales.  Breast: deferred   Abdominal: Soft. She exhibits no distension. There is no tenderness.  Lymphadenopathy: She has no cervical adenopathy.  Skin: Skin is warm and dry. She is not diaphoretic.  Psychiatric: She has a normal mood and affect. Her behavior is normal.     Lab Results  Component Value Date   WBC 10.4 07/21/2022   HGB 14.1 07/21/2022   HCT 42.4 07/21/2022   PLT 214 07/21/2022   GLUCOSE 145 (H) 07/21/2022   CHOL 161 07/28/2021   TRIG 169.0 (H) 07/28/2021   HDL 40.20 07/28/2021   LDLDIRECT 108.0 08/15/2017   LDLCALC 87 07/28/2021   ALT  6 01/25/2022   AST 13 01/25/2022   NA 137 07/21/2022   K 4.5 07/21/2022   CL 104 07/21/2022   CREATININE 0.80 07/21/2022   BUN 14 07/21/2022   CO2 24 07/21/2022   TSH 2.102 02/02/2020   INR 1.1 (H) 04/27/2020   HGBA1C 5.7 01/25/2022   MICROALBUR 1.1 07/28/2021         Assessment & Plan:   Physical exam: Screening blood work  ordered Exercise   Weight   Substance abuse  none   Reviewed recommended immunizations.   Health Maintenance  Topic Date Due   Zoster Vaccines- Shingrix (1 of 2) Never done   FOOT EXAM  02/24/2020   DEXA SCAN  03/20/2020   COVID-19 Vaccine (5 - Pfizer risk series) 09/06/2021   Diabetic kidney evaluation - Urine ACR  07/28/2022    HEMOGLOBIN A1C  07/28/2022   Medicare Annual Wellness (AWV)  10/17/2022   OPHTHALMOLOGY EXAM  01/10/2023   Diabetic kidney evaluation - GFR measurement  07/22/2023   TETANUS/TDAP  02/27/2024   Pneumonia Vaccine 79+ Years old  Completed   INFLUENZA VACCINE  Completed   HPV VACCINES  Aged Out          See Problem List for Assessment and Plan of chronic medical problems.

## 2022-07-31 ENCOUNTER — Ambulatory Visit (INDEPENDENT_AMBULATORY_CARE_PROVIDER_SITE_OTHER): Payer: Medicare Other | Admitting: Internal Medicine

## 2022-07-31 VITALS — BP 140/80 | HR 68 | Temp 98.0°F | Ht 64.0 in | Wt 142.0 lb

## 2022-07-31 DIAGNOSIS — E781 Pure hyperglyceridemia: Secondary | ICD-10-CM

## 2022-07-31 DIAGNOSIS — N1831 Chronic kidney disease, stage 3a: Secondary | ICD-10-CM | POA: Diagnosis not present

## 2022-07-31 DIAGNOSIS — M8580 Other specified disorders of bone density and structure, unspecified site: Secondary | ICD-10-CM

## 2022-07-31 DIAGNOSIS — E041 Nontoxic single thyroid nodule: Secondary | ICD-10-CM

## 2022-07-31 DIAGNOSIS — I480 Paroxysmal atrial fibrillation: Secondary | ICD-10-CM

## 2022-07-31 DIAGNOSIS — I700 Atherosclerosis of aorta: Secondary | ICD-10-CM

## 2022-07-31 DIAGNOSIS — E1122 Type 2 diabetes mellitus with diabetic chronic kidney disease: Secondary | ICD-10-CM | POA: Diagnosis not present

## 2022-07-31 DIAGNOSIS — Z0000 Encounter for general adult medical examination without abnormal findings: Secondary | ICD-10-CM

## 2022-07-31 DIAGNOSIS — F3289 Other specified depressive episodes: Secondary | ICD-10-CM

## 2022-07-31 DIAGNOSIS — I10 Essential (primary) hypertension: Secondary | ICD-10-CM | POA: Diagnosis not present

## 2022-07-31 DIAGNOSIS — E010 Iodine-deficiency related diffuse (endemic) goiter: Secondary | ICD-10-CM | POA: Diagnosis not present

## 2022-07-31 DIAGNOSIS — R32 Unspecified urinary incontinence: Secondary | ICD-10-CM | POA: Insufficient documentation

## 2022-07-31 DIAGNOSIS — F32A Depression, unspecified: Secondary | ICD-10-CM | POA: Insufficient documentation

## 2022-07-31 LAB — CBC WITH DIFFERENTIAL/PLATELET
Basophils Absolute: 0 10*3/uL (ref 0.0–0.1)
Basophils Relative: 0.5 % (ref 0.0–3.0)
Eosinophils Absolute: 0.1 10*3/uL (ref 0.0–0.7)
Eosinophils Relative: 1.2 % (ref 0.0–5.0)
HCT: 41 % (ref 36.0–46.0)
Hemoglobin: 14 g/dL (ref 12.0–15.0)
Lymphocytes Relative: 17.7 % (ref 12.0–46.0)
Lymphs Abs: 1.3 10*3/uL (ref 0.7–4.0)
MCHC: 34.2 g/dL (ref 30.0–36.0)
MCV: 85.6 fl (ref 78.0–100.0)
Monocytes Absolute: 0.8 10*3/uL (ref 0.1–1.0)
Monocytes Relative: 10.8 % (ref 3.0–12.0)
Neutro Abs: 5.1 10*3/uL (ref 1.4–7.7)
Neutrophils Relative %: 69.8 % (ref 43.0–77.0)
Platelets: 197 10*3/uL (ref 150.0–400.0)
RBC: 4.79 Mil/uL (ref 3.87–5.11)
RDW: 14.1 % (ref 11.5–15.5)
WBC: 7.2 10*3/uL (ref 4.0–10.5)

## 2022-07-31 LAB — COMPREHENSIVE METABOLIC PANEL
ALT: 4 U/L (ref 0–35)
AST: 13 U/L (ref 0–37)
Albumin: 4 g/dL (ref 3.5–5.2)
Alkaline Phosphatase: 66 U/L (ref 39–117)
BUN: 17 mg/dL (ref 6–23)
CO2: 29 mEq/L (ref 19–32)
Calcium: 9.4 mg/dL (ref 8.4–10.5)
Chloride: 104 mEq/L (ref 96–112)
Creatinine, Ser: 0.91 mg/dL (ref 0.40–1.20)
GFR: 59.45 mL/min — ABNORMAL LOW (ref >60.00)
Glucose, Bld: 134 mg/dL — ABNORMAL HIGH (ref 70–99)
Potassium: 4.1 mEq/L (ref 3.5–5.1)
Sodium: 140 mEq/L (ref 135–145)
Total Bilirubin: 1.7 mg/dL — ABNORMAL HIGH (ref 0.2–1.2)
Total Protein: 6.8 g/dL (ref 6.0–8.3)

## 2022-07-31 LAB — LIPID PANEL
Cholesterol: 152 mg/dL (ref 0–200)
HDL: 39.4 mg/dL (ref >39.00)
LDL Cholesterol: 85 mg/dL (ref 0–99)
NonHDL: 112.41
Total CHOL/HDL Ratio: 4
Triglycerides: 137 mg/dL (ref 0.0–149.0)
VLDL: 27.4 mg/dL (ref 0.0–40.0)

## 2022-07-31 LAB — TSH: TSH: 2.37 u[IU]/mL (ref 0.35–5.50)

## 2022-07-31 LAB — HEMOGLOBIN A1C: Hgb A1c MFr Bld: 5.7 % (ref 4.6–6.5)

## 2022-07-31 LAB — VITAMIN D 25 HYDROXY (VIT D DEFICIENCY, FRACTURES): VITD: 42.31 ng/mL (ref 30.00–100.00)

## 2022-07-31 NOTE — Assessment & Plan Note (Addendum)
Chronic Statin intolerant Healthy diet encouraged

## 2022-07-31 NOTE — Assessment & Plan Note (Signed)
New Seen on recent CT cervical spine We will get thyroid ultrasound

## 2022-07-31 NOTE — Assessment & Plan Note (Addendum)
Chronic Blood pressure well controlled CMP Continue amlodipine 5 mg daily, Coreg 25 mg twice daily

## 2022-07-31 NOTE — Assessment & Plan Note (Signed)
Chronic Following with cardiology On Coreg 25 mg twice daily and aspirin 81 mg daily

## 2022-07-31 NOTE — Assessment & Plan Note (Signed)
Chronic   Lab Results  Component Value Date   HGBA1C 5.7 01/25/2022   Sugars well controlled Check A1c, urine microalbumin today Continue metformin XR 1500 mg daily Stressed regular exercise, diabetic diet

## 2022-07-31 NOTE — Assessment & Plan Note (Signed)
Chronic Managed by Dr. Tomi Likens Continue Lexapro 5 mg daily

## 2022-07-31 NOTE — Assessment & Plan Note (Signed)
Chronic Typically wears depends

## 2022-07-31 NOTE — Assessment & Plan Note (Signed)
Chronic CMP, CBC

## 2022-07-31 NOTE — Assessment & Plan Note (Addendum)
Chronic DEXA due-discussed - ordered Check vitamin D level Taking multivitamin-continue Encouraged regular exercise

## 2022-07-31 NOTE — Assessment & Plan Note (Signed)
Chronic Check lipid panel Currently diet controlled Healthy diet, regular exercise encouraged

## 2022-08-01 LAB — MICROALBUMIN / CREATININE URINE RATIO
Creatinine,U: 342.6 mg/dL
Microalb Creat Ratio: 1 mg/g (ref 0.0–30.0)
Microalb, Ur: 3.6 mg/dL — ABNORMAL HIGH (ref 0.0–1.9)

## 2022-08-08 ENCOUNTER — Ambulatory Visit
Admission: RE | Admit: 2022-08-08 | Discharge: 2022-08-08 | Disposition: A | Discharge status: Home / Self Care | Payer: Medicare Other | Source: Ambulatory Visit | Attending: Internal Medicine | Admitting: Internal Medicine

## 2022-08-08 DIAGNOSIS — E010 Iodine-deficiency related diffuse (endemic) goiter: Secondary | ICD-10-CM | POA: Diagnosis not present

## 2022-08-08 DIAGNOSIS — E041 Nontoxic single thyroid nodule: Secondary | ICD-10-CM | POA: Diagnosis not present

## 2022-08-11 NOTE — Addendum Note (Signed)
Addended by: Binnie Rail on: 08/11/2022 11:15 AM   Modules accepted: Orders

## 2022-08-19 ENCOUNTER — Other Ambulatory Visit: Payer: Self-pay

## 2022-08-19 ENCOUNTER — Emergency Department (HOSPITAL_COMMUNITY): Payer: Medicare Other

## 2022-08-19 ENCOUNTER — Emergency Department (HOSPITAL_COMMUNITY)
Admission: EM | Admit: 2022-08-19 | Discharge: 2022-08-19 | Disposition: A | Discharge status: Home / Self Care | Payer: Medicare Other | Attending: Emergency Medicine | Admitting: Emergency Medicine

## 2022-08-19 DIAGNOSIS — S0990XA Unspecified injury of head, initial encounter: Secondary | ICD-10-CM | POA: Diagnosis not present

## 2022-08-19 DIAGNOSIS — Z853 Personal history of malignant neoplasm of breast: Secondary | ICD-10-CM | POA: Insufficient documentation

## 2022-08-19 DIAGNOSIS — N183 Chronic kidney disease, stage 3 unspecified: Secondary | ICD-10-CM | POA: Diagnosis not present

## 2022-08-19 DIAGNOSIS — Z7984 Long term (current) use of oral hypoglycemic drugs: Secondary | ICD-10-CM | POA: Insufficient documentation

## 2022-08-19 DIAGNOSIS — I6523 Occlusion and stenosis of bilateral carotid arteries: Secondary | ICD-10-CM | POA: Diagnosis not present

## 2022-08-19 DIAGNOSIS — I129 Hypertensive chronic kidney disease with stage 1 through stage 4 chronic kidney disease, or unspecified chronic kidney disease: Secondary | ICD-10-CM | POA: Diagnosis not present

## 2022-08-19 DIAGNOSIS — E1140 Type 2 diabetes mellitus with diabetic neuropathy, unspecified: Secondary | ICD-10-CM | POA: Diagnosis not present

## 2022-08-19 DIAGNOSIS — W01198A Fall on same level from slipping, tripping and stumbling with subsequent striking against other object, initial encounter: Secondary | ICD-10-CM | POA: Insufficient documentation

## 2022-08-19 DIAGNOSIS — Z79899 Other long term (current) drug therapy: Secondary | ICD-10-CM | POA: Insufficient documentation

## 2022-08-19 DIAGNOSIS — E1122 Type 2 diabetes mellitus with diabetic chronic kidney disease: Secondary | ICD-10-CM | POA: Diagnosis not present

## 2022-08-19 DIAGNOSIS — S0101XA Laceration without foreign body of scalp, initial encounter: Secondary | ICD-10-CM | POA: Insufficient documentation

## 2022-08-19 DIAGNOSIS — G20A1 Parkinson's disease without dyskinesia, without mention of fluctuations: Secondary | ICD-10-CM | POA: Insufficient documentation

## 2022-08-19 DIAGNOSIS — W19XXXA Unspecified fall, initial encounter: Secondary | ICD-10-CM

## 2022-08-19 DIAGNOSIS — G319 Degenerative disease of nervous system, unspecified: Secondary | ICD-10-CM | POA: Diagnosis not present

## 2022-08-19 DIAGNOSIS — Z7982 Long term (current) use of aspirin: Secondary | ICD-10-CM | POA: Diagnosis not present

## 2022-08-19 MED ORDER — LIDOCAINE-EPINEPHRINE (PF) 2 %-1:200000 IJ SOLN
10.0000 mL | Freq: Once | INTRAMUSCULAR | Status: AC
  Administered 2022-08-19: 10 mL
  Filled 2022-08-19: qty 20

## 2022-08-19 NOTE — ED Provider Notes (Signed)
Blood pressure (!) 167/69, pulse 72, temperature 98.2 F (36.8 C), resp. rate 20, SpO2 98 %.  Assuming care from Dr. Mayra Neer.  In short, [name] is a 81 y.o. female with a chief complaint of Fall .  Refer to the original H&P for additional details.  The current plan of care is to staple once back in a room.  08:13 PM  Patient evaluated once she got back to a treatment room.  She has a clot now developed in the scalp laceration.  I was able to remove this, approximate the edges loosely, and placed 4 staples with hemostasis.  Patient tolerated the procedure well as below.  Advised that she will need to return in 7 days for staple removal. She is UTD on tetanus.   Marland Kitchen.Laceration Repair  Date/Time: 08/19/2022 8:12 PM  Performed by: Margette Fast, MD Authorized by: Margette Fast, MD   Consent:    Consent obtained:  Verbal   Consent given by:  Patient   Risks, benefits, and alternatives were discussed: yes     Risks discussed:  Infection, need for additional repair, nerve damage, poor wound healing, poor cosmetic result, pain, retained foreign body and vascular damage   Alternatives discussed:  No treatment Universal protocol:    Patient identity confirmed:  Verbally with patient Anesthesia:    Anesthesia method:  Local infiltration   Local anesthetic:  Lidocaine 2% WITH epi Laceration details:    Location:  Scalp   Scalp location:  Occipital   Length (cm):  2 Pre-procedure details:    Preparation:  Patient was prepped and draped in usual sterile fashion and imaging obtained to evaluate for foreign bodies Exploration:    Hemostasis achieved with:  Direct pressure   Imaging obtained comment:  CT head   Imaging outcome: foreign body not noted     Wound exploration: entire depth of wound visualized     Wound extent: areolar tissue not violated, fascia not violated, no foreign body, no signs of injury, no nerve damage, no tendon damage, no underlying fracture and no vascular  damage     Contaminated: no   Treatment:    Area cleansed with:  Saline   Amount of cleaning:  Standard   Irrigation solution:  Sterile saline   Irrigation method:  Pressure wash Skin repair:    Repair method:  Staples   Number of staples:  4 Approximation:    Approximation:  Loose Repair type:    Repair type:  Simple Post-procedure details:    Dressing:  Open (no dressing)   Procedure completion:  Tolerated well, no immediate complications     Ravynn Hogate, [facility], MD 08/19/22 2014

## 2022-08-19 NOTE — ED Triage Notes (Signed)
C/o fall from standing position and hitting back of head on cabinet. Pt reports looking at phone and remember being dizzy and falling and hitting head.  Unsure if LOC.  Denies blood thinner usage.  Bleeding controlled. A&Ox4.  Hx Parkinson.

## 2022-08-19 NOTE — ED Provider Notes (Signed)
[facility] DEPT Provider Note   CSN: [id number] Arrival date & time: 08/19/22  1145     History  Chief Complaint  Patient presents with   Lytle Michaels    [name] is a 81 y.o. female with Parkinson's disease, T2DM, IBS, history of breast cancer status post bilateral mastectomy, HTN, Gilbert's syndrome, paroxysmal A-fib, CKD stage III, urinary incontinence, and depression who presents with fall.   Patient fell today at home and in the back of her head on the floor as she did not utilize her walker appropriately, she states she was looking at her phone and tried to walk at the same time which is what made her fall.  She did not lose consciousness.  Endorses head pain with palpation but states that when she is not touching it does not hurt.  Denies any neck pain or pain anywhere else as a result of the fall including shoulders back abdomen chest hips or extremities. Does not take a blood thinner.  HPI     Home Medications Prior to Admission medications   Medication Sig Start Date End Date Taking? Authorizing Provider  amLODipine (NORVASC) 5 MG tablet Take 1 tablet (5 mg total) by mouth daily. 01/25/22   Binnie Rail, MD  aspirin 81 MG chewable tablet Chew 1 tablet (81 mg total) by mouth daily. 02/07/20   Matcha, Beverely Pace, MD  carbidopa-levodopa (SINEMET IR) 25-100 MG tablet Take 1.5 tablets by mouth 4 (four) times daily. 01/16/22   Pieter Partridge, DO  carvedilol (COREG) 25 MG tablet Take 1 tablet (25 mg total) by mouth 2 (two) times daily with a meal. 01/25/22   Burns, Claudina Lick, MD  Cyanocobalamin (VITAMIN B-12) 5000 MCG SUBL Place 5,000 mcg under the tongue daily.     [provider]  escitalopram (LEXAPRO) 5 MG tablet Take 1 tablet (5 mg total) by mouth daily. 07/17/22   Pieter Partridge, DO  metFORMIN (GLUCOPHAGE XR) 750 MG 24 hr tablet Take 2 tablets (1,500 mg total) by mouth daily with breakfast. 01/25/22   Binnie Rail, MD  Multiple Vitamin  (MULTIVITAMIN WITH MINERALS) TABS tablet Take 1 tablet by mouth daily.    [provider]  Omega-3 Fatty Acids (FISH OIL CONCENTRATE) 1000 MG CAPS Take 1,000 mg by mouth 2 (two) times daily.    [provider]      Allergies    Elemental sulfur, Exemestane, Morphine and related, Statins, Ace inhibitors, and Codeine    Review of Systems   Review of Systems Review of systems Negative for LOC.  A 10 point review of systems was performed and is negative unless otherwise reported in HPI.  Physical Exam Updated Vital Signs BP (!) 167/69   Pulse 72   Temp 98.2 F (36.8 C) Comment: Simultaneous filing. User may not have seen previous data.  Resp 20 Comment: Simultaneous filing. User may not have seen previous data.  SpO2 98%  Physical Exam General: Normal appearing female, sitting in a chair. HEENT: PERRLA, EOMI, sclera anicteric, MMM, trachea midline.  3 x 3 centimeter hematoma to the back of the head.  Associated with a 2 cm linear laceration, hemostatic.  No depressed skull deformities palpable.  No C-spine midline tenderness palpation. Cardiology: RRR, no murmurs/rubs/gallops. BL radial and DP pulses equal bilaterally.  Resp: Normal respiratory rate and effort. CTAB, no wheezes, rhonchi, crackles.  Abd: Soft, non-tender, non-distended. No rebound tenderness or guarding.  GU: Deferred. MSK: No peripheral edema or signs  of trauma. Extremities without deformity or TTP. No cyanosis or clubbing. Skin: warm, dry. No rashes or lesions. Back: No CT or L-spine tenderness or step-offs to palpation Neuro: A&Ox4, CNs II-XII grossly intact. MAEs. Sensation grossly intact.  Tongue protrudes midline. Psych: Normal mood and affect.   ED Results / Procedures / Treatments    Radiology CT Head Wo Contrast  Result Date: 08/19/2022 CLINICAL DATA:  Head trauma.  Fall.  Moderate to severe. EXAM: CT HEAD WITHOUT CONTRAST TECHNIQUE: Contiguous axial images were obtained from the base of the  skull through the vertex without intravenous contrast. RADIATION DOSE REDUCTION: This exam was performed according to the departmental dose-optimization program which includes automated exposure control, adjustment of the mA and/or kV according to patient size and/or use of iterative reconstruction technique. COMPARISON:  None Available. FINDINGS: Brain: No acute infarct, hemorrhage, or mass lesion is present. Mild generalized atrophy is present. Moderate scattered subcortical white matter hypoattenuation is present bilaterally. The ventricles are proportionate to the degree of atrophy. No significant extraaxial fluid collection is present. Vascular: Atherosclerotic calcifications are present within the cavernous internal carotid arteries bilaterally. No hyperdense vessel is present. Skull: Calvarium is intact. No focal lytic or blastic lesions are present. No significant extracranial soft tissue lesion is present. Sinuses/Orbits: A single posterior right ethmoid air cell is opacified. Paranasal sinuses are otherwise clear. Bilateral lens replacements are noted. Globes and orbits are otherwise unremarkable. IMPRESSION: 1. No acute intracranial abnormality. 2. Mild generalized atrophy and moderate scattered subcortical white matter hypoattenuation. This likely reflects the sequela of chronic microvascular ischemia. Electronically Signed   By: San Morelle M.D.   On: 08/19/2022 12:59    Procedures Procedures    Medications Ordered in ED Medications - No data to display  ED Course/ Medical Decision Making/ A&P                          Medical Decision Making Amount and/or Complexity of Data Reviewed Radiology: ordered. Decision-making details documented in ED Course.   This patient presents to the ED for concern of head injury and fall, and is a complaint that carries with it a high risk of complications and morbidity.  I considered the following differential and admission for this  condition.  MDM:    Patient with mechanical fall without loss of consciousness presents with a scalp laceration on the back of her head.  As well as a hematoma.  Patient does not take any blood thinners but given her age and comorbidities we will perform CT head to evaluate for ICH or skull fracture.  Patient without any neck tenderness she is alert and oriented and can rule out C-spine injury by Nexus criteria.  Patient is otherwise in her normal state of health with no other complaints and no other pain.  She endorses that she did not lose consciousness and otherwise feels well.  Husband at bedside confirms that patient is at her baseline.  Do not believe labs are necessary at this time.  Labs: I Ordered, and personally interpreted labs.  The pertinent results include: CT head with chronic microvascular ischemic changes but no ICH or skull fracture  Imaging Studies ordered: I ordered imaging studies including CT head which is negative for acute pathology I independently visualized and interpreted imaging. I agree with the radiologist interpretation  Additional history obtained from patient's husband.   Social Determinants of Health:  patient lives at home with her husband.  Walks with  a walker at baseline.  Disposition: Will be able to discharge after repair of laceration of scalp with staples  Co morbidities that complicate the patient evaluation  Past Medical History:  Diagnosis Date   Acute renal failure superimposed on stage 3 chronic kidney disease ([place]) 01/31/2020   Breast cancer ([place]) 2012   Cancer of central portion of right female breast ([place]) 09/04/2010   Patient has a long history of fibrocystic disease. 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 She underwent right total mastectomy with sentinel node biopsy and left total (prophylactic) mastectomy on 10/12/2010. She underwent immediate reconstruction with expander and AlloDerm placement. 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 And DCIS, margins negative. One lymph   Complicated UTI (urinary tract infection) 01/31/2020   Diabetes mellitus    Dizziness 02/24/2019   Elevated LFTs 08/10/2014   Episodic lightheadedness 08/27/2019   Esophageal reflux 05/06/2008   Essential hypertension 02/16/2009   Family history of breast cancer    Family history of ovarian cancer    Fatigue 02/24/2019   Fatty liver 08/05/2015   Genetic testing 09/04/2017   Negative genetic testing on the common hereditary cancer panel.  The Hereditary Gene Panel offered by [facility] includes sequencing and/or deletion duplication testing of the following 47 genes: APC, ATM, AXIN2, BARD1, BMPR1A, BRCA1, BRCA2, BRIP1, CDH1, CDK4, CDKN2A (p14ARF), CDKN2A (p16INK4a), CHEK2, CTNNA1, DICER1, EPCAM (Deletion/duplication testing only), GREM1 (promoter region deletion/duplicat   GERD (gastroesophageal reflux disease)    [name] syndrome    GILBERT'S SYNDROME 02/06/2007   Qualifier: Diagnosis of  By: Janelle Floor     History of breast cancer 08/04/2015   S/p b/l mastectomy   History of colonic polyps 06/06/2010   Annotation: destroyed, no clear adenomatous proven Qualifier: Diagnosis of  By: Carlean Purl MD, Tonna Boehringer E  2008 polyp 2013 neg     Hypertension    Hypertriglyceridemia 09/30/2007   statin intolerant  Father MI @ 105 Sister CVA > 65    Hypokalemia due to inadequate potassium intake 01/31/2020   IBS (irritable bowel syndrome)    Joint pain 09/02/2010   Lactic acidosis 01/31/2020   Osteoarthritis of left knee 08/04/2015   Significant arthritis in knees, limits her activity    Osteopenia 08/31/2013   [facility]  DEXA 03/20/2018: Osteopenia:  LFN -1.3, R--1.0, spine -0.6-statistically significant increase in BMD in all areas  Findings 08/22/13 : lowest T score -  1.3  @  R femoral neck (hip) ,3.4  %loss @ R femur &   4.1 % in spine   since  2012 Dexa 01/05/16: lowest  - L femur neck T -1.6 Diagnosis: mild Osteopenia Rx: Fosamax  remotely; now on Arimidex    Parkinsonian features 01/31/2020   Peripheral neuropathy 08/23/2018   Plantar fasciitis of right foot    Dr Oneta Rack   Poor balance 02/20/2018   S/P bilateral mastectomy 08/04/2015   Septic shock ([place]) 01/31/2020   Severe sepsis with acute organ dysfunction due to gram-negative bacteria ([place]) 01/31/2020   Transfusion history 1976   Type 2 diabetes mellitus with stage 3 chronic kidney disease, without long-term current use of insulin ([place]) 08/11/2008   Ophth, Dr Kathrin Penner: no retinopathy  Diabetes maternal grandmother     Medicines No orders of the defined types were placed in this encounter.   I have reviewed the patients home medicines and have made adjustments as needed  Problem List / ED Course: Problem List Items Addressed This Visit   None  Visit Diagnoses     Fall, initial encounter    -  Primary   Injury of head, initial encounter               This note was created using dictation software, which may contain spelling or grammatical errors.    Audley Hose, MD 08/19/22 [id number]

## 2022-08-19 NOTE — ED Notes (Signed)
Pt called for room, no response from lobby. Upon documentation of pt being called x2,  pt and family return to the ED and informed registration that they had returmed

## 2022-08-19 NOTE — Discharge Instructions (Signed)
Thank you for coming to [facility] Emergency Department. You were seen for a fall. We did an exam and imaging, and these showed a hematoma and laceration on your scalp. The wound was cleaned and closed with staples.  Please follow up with your primary care provider in 7 days to have the staples removed.  Do not hesitate to return to the ED or call 911 if you experience: -Worsening symptoms -Signs of infection including increased redness, swelling, pus drainage from wound, increased pain -Lightheadedness, passing out -Fevers/chills -Anything else that concerns you

## 2022-08-23 ENCOUNTER — Encounter: Payer: Self-pay | Admitting: *Deleted

## 2022-08-23 ENCOUNTER — Telehealth: Payer: Self-pay | Admitting: *Deleted

## 2022-08-23 DIAGNOSIS — G20A1 Parkinson's disease without dyskinesia, without mention of fluctuations: Secondary | ICD-10-CM

## 2022-08-23 NOTE — Patient Outreach (Signed)
Care Coordination TOC Note Transition Care Management Follow-up Telephone Call Date of discharge and from where: ED Visit 08/19/22 [facility]; fall with scalp laceration; ED EMMI Red Alert: "no scheduled follow up" How have you been since you were released from the hospital? Per husband Sam on [facility] DPR: "she has had several falls over the last few months and 2 have required ER visits.  It is like her condition has just gotten worse over the last few weeks, and I don't know why, but her balance seems to be off.  She is also more incontinent than she was before. We saw Dr. [name] after the last fall, and I plan to make an appointment to see her again, I don't need help scheduling that appointment-- I will do it first thing in the morning.  We also had a recent visit with her neurologist, Dr. Tomi Likens.  I am getting older and with her Parkinson's disease, it is very hard for me to take care of her, I have my own health  issues, but I am doing the best I can... I don't think we need a wheelchair, I think that might be very difficult for me to get her in and out of a wheelchair.  I have started looking into getting some hired help to come into the house, I would appreciate you putting me in touch with the care guide because I don't know where to start... I even wonder if she needs hospice? I have been in touch with our long-term care insurance plan and they have given me some good information... they told me to start calling places to find in-home care, and then to call them back once I have something set up.  I'll do everything you have advised and I will look forward to your call next week." Any questions or concerns? Yes 1) need for options for in-home care assistance: provided basic education around difference between home health services and private duty in-home care: care guide referral placed 2) provided education and discussed hospice/ palliative care services, and difference between these two services- care  coordination outreach completed with PCP as an FYI for her input/ advice, follow up conversation with patient and her spouse 3) frequent falls, recent increase in fall events: fall assessment completed; provided education around fall prevention strategies; assessed need for additional DME such as wheelchair- spouse declines need for additional DME at this time 4) increased incontinence in setting of caregiver limited ability to provide care for patient  5) scheduled follow up call for ongoing care coordination with myself on Thursday 08/31/22 at 11:00 am- discussed that care coordination needs after this scheduled call may be scheduled with designated RN CM care coordinator for practice  Items Reviewed: Did the pt receive and understand the discharge instructions provided? Yes  Medications obtained and verified? Yes  Other? No  Any new allergies since your discharge? No  Dietary orders reviewed? Yes Do you have support at home? Yes  spouse assists with all care needs and provides essentially total care  Home Care and Equipment/Supplies: Were home health services ordered? no If so, what is the name of the agency? N/A  Has the agency set up a time to come to the patient's home? not applicable Were any new equipment or medical supplies ordered?  No What is the name of the medical supply agency? N/A Were you able to get the supplies/equipment? not applicable Do you have any questions related to the use of the equipment or supplies?  No  Functional Questionnaire: (I = Independent and D = Dependent) ADLs: D  spouse assists with all care needs and provides essentially total care  Bathing/Dressing- D  spouse assists with all care needs and provides essentially total care  Meal Prep- D  spouse assists with all care needs and provides essentially total care  Eating- I  Maintaining continence- D  spouse assists with all care needs and provides essentially total care  Transferring/Ambulation- D  spouse assists with all care needs and provides essentially total care: patient currently using walker at all times  Managing Meds- D spouse manages all aspects of medication administration  Follow up appointments reviewed:  PCP Hospital f/u appt confirmed? No  Scheduled to see - on - @ - discussed need for prompt PCP appointment post-recent ED visit for fall with scalp laceration; spouse declines assistance in scheduling and assures me he will schedule office visit with PCP "first thing tomorrow" [facility] f/u appt confirmed? No  Scheduled to see - on - @ - Are transportation arrangements needed? No  If their condition worsens, is the pt aware to call PCP or go to the Emergency Dept.? Yes Was the patient provided with contact information for the PCP's office or ED? No- caregiver/ spouse reports he already has contact information for all care providers Was to pt encouraged to call back with questions or concerns? Yes- provided my direct phone number for any needs that arise in the future  SDOH assessments and interventions completed:   Yes SDOH Interventions Today    Flowsheet Row Most Recent Value  SDOH Interventions   Food Insecurity Interventions Intervention Not Indicated  Transportation Interventions Intervention Not Indicated  [husband provides transportation]      Care Coordination Interventions:  Referred for Care Coordination Services:  RN Care Coordinator Provided education around home health services/ in-home private duty care, hospice/ palliative care and placed Community resource care guide referral for in-home care options; encouraged caregiver to begin placing calls to the various agencies he has already looked into; contracted with caregiver to make prompt PCP appointment post-recent ED visit; discussed benefits of patient's long-term care insurance plan and encouraged his ongoing communication with plan; care coordination outreach with PCP to inform of spouse's  concerns; fall assessment completed with provision of education around fall prevention strategies at home    Encounter Outcome:  Pt. Visit Completed    Oneta Rack, RN, BSN, CCRN Alumnus RN CM Care Coordination/ Transition of [place] Management [phone]: direct office

## 2022-08-25 ENCOUNTER — Telehealth: Payer: Self-pay | Admitting: *Deleted

## 2022-08-25 NOTE — Progress Notes (Signed)
  Care Coordination   Note   08/25/2022 Name: [name] MRN: [id number] DOB: 1941/09/18  [name] is a 81 y.o. year old female who sees Burns, Claudina Lick, MD for primary care. I reached out to [name] by phone today to offer care coordination services.  [name] was given information about Care Coordination services today including:   The Care Coordination services include support from the care team which includes your Nurse Coordinator, Clinical Social Worker, or Pharmacist.  The Care Coordination team is here to help remove barriers to the health concerns and goals most important to you. Care Coordination services are voluntary, and the patient may decline or stop services at any time by request to their care team member.   Care Coordination Consent Status: Patient agreed to services and verbal consent obtained.   Follow up plan:  Telephone appointment with care coordination team member scheduled for:  08/28/2022  Encounter Outcome:  Pt. Scheduled from referral   Julian Hy, [place] Direct Dial: [phone]

## 2022-08-28 ENCOUNTER — Ambulatory Visit: Payer: Self-pay | Admitting: Licensed Clinical Social Worker

## 2022-08-28 NOTE — Patient Instructions (Signed)
Visit Information  Thank you for taking time to visit with me today. Please don't hesitate to contact me if I can be of assistance to you before our next scheduled telephone appointment.  Following are the goals we discussed today:   Our next appointment is by telephone on 10/02/22 at 2:00 PM   Please call the care guide team at [phone] if you need to cancel or reschedule your appointment.   If you are experiencing a Mental Health or [place] or need someone to talk to, please go to [facility] [phone])   Following is a copy of your full plan of care:   Interventions:  LCSW spoke via phone today with client, [name]. [name] and her spouse, [name]. LCSW spoke with client about her care needs. She said she has some difficulty with continence issues. She has support of her spouse Client and LCSW spoke of medication procurement of client. Spoke of client support with PCP. Client said she has appointment tomorrow with PCP. Dr. Billey Gosling. LCSW talked with client and spouse of program support with RN, SW and Pharmacy. LCSW gave client the name of RN Thea Silversmith as nursing RN with program  Also, TOC RN Reginia Naas has been communicating with client and her spouse about options and care support. Reginia Naas has communicated with Dr. [name] about client needs and current care options such as Hospice, Palliative Care, SNF Placement, in home support. Client and spouse seemed interested in program support . Client plans to talk with PCP about program support  [name] was given information about Care Management services by the embedded care coordination team including:  Care Management services include personalized support from designated clinical staff supervised by her physician, including individualized plan of care and coordination with other care providers 24/7 contact phone numbers for  assistance for urgent and routine care needs. The patient may stop CCM services at any time (effective at the end of the month) by phone call to the office staff.  Patient agreed to services and verbal consent obtained.   Norva Riffle.[name] MSW, [place] representative [facility] Care Management [phone]

## 2022-08-28 NOTE — Progress Notes (Unsigned)
Subjective:    Patient ID: [name], female    DOB: 26-Feb-1941, 81 y.o.   MRN: [id number]     HPI [name] is here for follow up from the ED - falling   11/3 - syncope in bathroom.   12/2 - she fell at home and hit back of head.  Some head pain when touching the area.  No other pain.  occipital laceration - 4 staples placed.  Ct w/o acute bleed.  She denies headaches, lightheadedness.  She does have some dizziness when laying down it is transient and new since the fall.   Her and her husband are here and have several concerns- Concerns with balance - falling Bladder incontinence Bowel incontinence at times Bathing 2-3 times a week - needs assistance Brush teeth- she can not brush her teeth Medicines on schedule-she currently manages her own medication and he is not sure if she is not missing any doses or not   She wants to go to assisted living  They have long term care insurance    They are trying to figure out they should do  Medications and allergies reviewed with patient and updated if appropriate.  Current Outpatient Medications on File Prior to Visit  Medication Sig Dispense Refill   amLODipine (NORVASC) 5 MG tablet Take 1 tablet (5 mg total) by mouth daily. 90 tablet 1   aspirin 81 MG chewable tablet Chew 1 tablet (81 mg total) by mouth daily. 30 tablet 0   carbidopa-levodopa (SINEMET IR) 25-100 MG tablet Take 1.5 tablets by mouth 4 (four) times daily. 180 tablet 5   carvedilol (COREG) 25 MG tablet Take 1 tablet (25 mg total) by mouth 2 (two) times daily with a meal. 180 tablet 1   Cyanocobalamin (VITAMIN B-12) 5000 MCG SUBL Place 5,000 mcg under the tongue daily.      escitalopram (LEXAPRO) 5 MG tablet Take 1 tablet (5 mg total) by mouth daily. 30 tablet 5   metFORMIN (GLUCOPHAGE XR) 750 MG 24 hr tablet Take 2 tablets (1,500 mg total) by mouth daily with breakfast. 180 tablet 1   Multiple Vitamin (MULTIVITAMIN WITH MINERALS) TABS tablet Take 1 tablet  by mouth daily.     Omega-3 Fatty Acids (FISH OIL CONCENTRATE) 1000 MG CAPS Take 1,000 mg by mouth 2 (two) times daily.     No current facility-administered medications on file prior to visit.     Review of Systems  Constitutional:  Negative for fever.  Eyes:  Negative for visual disturbance.  Musculoskeletal:  Positive for gait problem.  Neurological:  Positive for dizziness. Negative for light-headedness and headaches.       Objective:   Vitals:   08/29/22 1358  BP: 134/68  Pulse: 72  Temp: 98.1 F (36.7 C)  SpO2: 95%   BP Readings from Last 3 Encounters:  08/29/22 134/68  08/19/22 (!) 167/86  07/31/22 (!) 140/80   Wt Readings from Last 3 Encounters:  08/29/22 145 lb (65.8 kg)  07/31/22 142 lb (64.4 kg)  07/17/22 153 lb (69.4 kg)   Body mass index is 24.89 kg/m.    Physical Exam Constitutional:      General: She is not in acute distress.    Appearance: Normal appearance. She is not ill-appearing.  HENT:     Head: Normocephalic.     Comments: Left posterior head - scabbed laceration with 4 staples Neurological:     Mental Status: She is alert.  Procedure:  staple removal Indications: head laceration after fall - 4 staples placed in ED   Procedure Details Consent:  Verbal consent for procedure obtained.  Location - left posterior mid head - scabbed laceration approximately 2 inches in length    4 staples removed with staple remover.  No bleeding following removal.    No evidence of infection.      Patient tolerated procedure well.     Lab Results  Component Value Date   WBC 7.2 07/31/2022   HGB 14.0 07/31/2022   HCT 41.0 07/31/2022   PLT 197.0 07/31/2022   GLUCOSE 134 (H) 07/31/2022   CHOL 152 07/31/2022   TRIG 137.0 07/31/2022   HDL 39.40 07/31/2022   LDLDIRECT 108.0 08/15/2017   LDLCALC 85 07/31/2022   ALT 4 07/31/2022   AST 13 07/31/2022   NA 140 07/31/2022   K 4.1 07/31/2022   CL 104 07/31/2022   CREATININE 0.91 07/31/2022    BUN 17 07/31/2022   CO2 29 07/31/2022   TSH 2.37 07/31/2022   INR 1.1 (H) 04/27/2020   HGBA1C 5.7 07/31/2022   MICROALBUR 3.6 (H) 07/31/2022     Assessment & Plan:    See Problem List for Assessment and Plan of chronic medical problems.

## 2022-08-28 NOTE — Patient Outreach (Signed)
  Care Coordination   Initial Visit Note   08/28/2022 Name: [name] MRN: [id number] DOB: [birth year]  [name] is a 81 y.o. year old female who sees Burns, Claudina Lick, MD for primary care. I spoke with  [name] by phone today. I spoke by phone today with [name] , spouse of client   What matters to the patients health and wellness today? Client wants to talk with PCP about program support     Goals Addressed             This Visit's Progress    Patient said she would like to talk with PCP about program support       Interventions:  LCSW spoke via phone today with client, Kyree Fedorko. [place] and her spouse, Roylene Heaton. LCSW spoke with client about her care needs. She said she has some difficulty with continence issues. She has support of her spouse Client and LCSW spoke of medication procurement of client. Spoke of client support with PCP. Client said she has appointment tomorrow with PCP. Dr. Billey Gosling. LCSW talked with client and spouse of program support with RN, SW and Pharmacy. LCSW gave client the name of RN Thea Silversmith as nursing RN with program  Also, TOC RN Reginia Naas has been communicating with client and her spouse about options and care support. Reginia Naas has communicated with Dr. [name] about client needs and current care options such as Hospice, Palliative Care, SNF Placement, in home support. Client and spouse seemed interested in program support . Client plans to talk with PCP about program support     SDOH assessments and interventions completed:  Yes  SDOH Interventions Today    Flowsheet Row Most Recent Value  SDOH Interventions   Depression Interventions/Treatment  Counseling  Physical Activity Interventions Other (Comments)  [client is at risk for falls. She uses rollator walker to help with ambulatoin]  Stress Interventions Provide Counseling  [client has stress related to managing medical needs and managing  daily needs]        Care Coordination Interventions:  Yes, provided   Follow up plan: Follow up call scheduled for 10/02/22 at 2:00 PM     Encounter Outcome:  Pt. Visit Completed   Norva Riffle.Conlee Sliter MSW, [place] Holiday representative [facility] Care Management [phone]

## 2022-08-29 ENCOUNTER — Ambulatory Visit (INDEPENDENT_AMBULATORY_CARE_PROVIDER_SITE_OTHER): Payer: Medicare Other | Admitting: Internal Medicine

## 2022-08-29 ENCOUNTER — Encounter: Payer: Self-pay | Admitting: Internal Medicine

## 2022-08-29 VITALS — BP 134/68 | HR 72 | Temp 98.1°F | Ht 64.0 in | Wt 145.0 lb

## 2022-08-29 DIAGNOSIS — E1122 Type 2 diabetes mellitus with diabetic chronic kidney disease: Secondary | ICD-10-CM

## 2022-08-29 DIAGNOSIS — S0101XA Laceration without foreign body of scalp, initial encounter: Secondary | ICD-10-CM | POA: Diagnosis not present

## 2022-08-29 DIAGNOSIS — N1831 Chronic kidney disease, stage 3a: Secondary | ICD-10-CM

## 2022-08-29 DIAGNOSIS — I10 Essential (primary) hypertension: Secondary | ICD-10-CM | POA: Diagnosis not present

## 2022-08-29 DIAGNOSIS — R296 Repeated falls: Secondary | ICD-10-CM

## 2022-08-29 DIAGNOSIS — S0191XA Laceration without foreign body of unspecified part of head, initial encounter: Secondary | ICD-10-CM | POA: Insufficient documentation

## 2022-08-29 MED ORDER — METFORMIN HCL ER 750 MG PO TB24: 1500.0000 mg | ORAL_TABLET | Freq: Every day | ORAL | 1 refills | Status: DC

## 2022-08-29 NOTE — Assessment & Plan Note (Addendum)
Acute Left posterior head with 2 inch laceration - scabbed over and well healed - no evidence of infection 4 staples removed No wound care needed

## 2022-08-29 NOTE — Assessment & Plan Note (Signed)
Chronic Lab Results  Component Value Date   HGBA1C 5.7 07/31/2022   Sugars very well controlled Decrease metformin to 750 mg daily - take with dinner

## 2022-08-29 NOTE — Patient Instructions (Addendum)
      Your staples were removed     Medications changes include :   change metformin to 1 pill a day - take with dinner.        Return for follow up as scheduled.

## 2022-08-29 NOTE — Assessment & Plan Note (Signed)
Chronic Blood pressure well-controlled Continue amlodipine 5 mg daily, Coreg 25 mg twice daily

## 2022-08-29 NOTE — Assessment & Plan Note (Signed)
Related to parkinson's Has done PT in past -- deferred PT - she does not think it has helped in the past and will not do the exercises outside of PT Uses walker

## 2022-08-31 ENCOUNTER — Encounter: Payer: Self-pay | Admitting: *Deleted

## 2022-08-31 ENCOUNTER — Telehealth: Payer: Self-pay | Admitting: *Deleted

## 2022-08-31 NOTE — Patient Instructions (Signed)
Visit Information  Thank you for taking time to visit with me today. Please don't hesitate to contact me if I can be of assistance to you.   Following are the goals we discussed today:   Goals Addressed             This Visit's Progress    Care Coordination Activities; scheduled with RN CM and with LCSW   On track    Care Coordination Interventions: Evaluation of current treatment plan related to Parkinson's Disease and patient's adherence to plan as established by provider Advised patient to caregiver: continue his efforts to determine options for in-home care vs. ALF/ ILF: has not heard from [facility] yet; spoke with LCSW Theadore Nan, now established in patient's ongoing care- reviewed with caregiver/ husband Sam recent PCP and LCSW visits; encouraged caregiver to continue engaing with [place] to ask his questions around options for placement in ALF; explained that there is often a wait time at ALF's and encouraged caregiver to begin contacting facilities soon for information and to continue considering in-home private duty options in the interim; scheduled follow up call with RN CM Care Coordinator for PCP practice for ongoing follow up: caregiver continues to report frequent patient falls and difficulties with managing her bowel incontinence  Provided education to patient re: reinforced previously provided education around fall prevention; confirmed patient continues using rollator walker "all the time" Collaborated with LCSW and RN CN Care Coordinator regarding today's follow up [facility] call for care coordination Provided patient with MyChart educational materials related to fall prevention Reviewed scheduled/upcoming provider appointments including 09/19/21- RN CM Care Coordinator telephone visit; 10/02/21- LCSW telephone call Discussed plans with patient for ongoing care management follow up and provided patient with direct contact information for care management team Advised  patient to discuss ongoing needs with provider Assessed social determinant of health barriers Confirmed patient caregiver has phone numbers for care coordination team, including my direct contact number; reinforced need for caregiver and patient to begin process of calling about options for in-home vs facility care; confirmed caregiver report that patient has current long-term care insurance which he reports will not be available for benefit use until after he and patient have made definitive decision around care options and have established plan           The next appointment with RN Care Coordinator is by telephone on Wednesday, 09/20/22 at 11:00 am  Please call the care guide team at [phone] if you need to cancel or reschedule your appointment.   If you are experiencing a Mental Health or [place] or need someone to talk to, please  call the Suicide and Crisis Lifeline: 988 call the [place] National Suicide Prevention Lifeline: 236-700-0850 or TTY: 218-044-7121 TTY 617 386 0691) to talk to a trained counselor call 1-800-273-TALK (toll free, 24 hour hotline) go to [facility] [address] [place] [phone]) call the [place]: [phone] call 911   Patient verbalizes understanding of instructions and care plan provided today and agrees to view in [place]. Active MyChart status and patient understanding of how to access instructions and care plan via MyChart confirmed with patient.     Telephone follow up appointment with care management team member scheduled for: 09/20/22 RN CM Care Coordinator and 10/02/22 LCSW  Oneta Rack, RN, BSN, CCRN Alumnus RN CM Care Coordination/ Transition of [place] Management [phone]: direct office

## 2022-08-31 NOTE — Patient Outreach (Signed)
Care Coordination   Follow Up Visit Note   08/31/2022 Name: EDMUND RICK MRN: [id number] DOB: 05-Nov-1940  JONALYN SEDLAK is a 81 y.o. year old female who sees Burns, Claudina Lick, MD for primary care. I spoke with husband/ caregiver Sam, on [place] by phone today for follow up after recent ED TOC call.  What matters to the patients health and wellness today?  Per caregiver/ husband Sam on [facility] DPR: "I am just not sure where to start-- this is so much to decide.  Dr. [name] did not think that [name] is ready for hospice and she basically recommended that we look into Assisted Living Facilities-- but I just don't know where to start.  I have contacted a friend whose wife is on the elder board and has some familiarity with this process, but I haven't heard back from her yet.  I talked to the social worker, but he told me that he couldn't really offer any specific facilities for [place] to consider.  I will try to make some of these calls to get more information before the nurse and social worker call me in January.  She continues to have falls even though she uses her walker all the time; she fell yesterday but did not get hurt, and I was able to help her get up, which I can't always do.  I continue having a problem with being able to keep her bowel incontinence issues under control.  I do the best I can, but with her IBS, it is very hard on me.  We do use the lined pads for her urinary incontinence, and that is not as hard for me to manage as the bowel incontinence is"      Goals Addressed             This Visit's Progress    Care Coordination Activities; scheduled with RN CM and with LCSW   On track    Care Coordination Interventions: Evaluation of current treatment plan related to Parkinson's Disease and patient's adherence to plan as established by provider Advised patient to caregiver: continue his efforts to determine options for in-home care vs. ALF/ ILF: has not heard  from [place] yet; spoke with LCSW Theadore Nan, now established in patient's ongoing care- reviewed with caregiver/ husband Sam recent PCP and LCSW visits; encouraged caregiver to continue engaing with [place] to ask his questions around options for placement in ALF; explained that there is often a wait time at ALF's and encouraged caregiver to begin contacting facilities soon for information and to continue considering in-home private duty options in the interim; scheduled follow up call with RN CM Care Coordinator for PCP practice for ongoing follow up: caregiver continues to report frequent patient falls and difficulties with managing her bowel incontinence  Provided education to patient re: reinforced previously provided education around fall prevention; confirmed patient continues using rollator walker "all the time" Collaborated with LCSW and RN CN Care Coordinator regarding today's follow up [facility] call for care coordination Provided patient with MyChart educational materials related to fall prevention Reviewed scheduled/upcoming provider appointments including 09/19/21- RN [place] Coordinator telephone visit; 10/02/21- LCSW telephone call Discussed plans with patient for ongoing care management follow up and provided patient with direct contact information for care management team Advised patient to discuss ongoing needs with provider Assessed social determinant of health barriers Confirmed patient caregiver has phone numbers for care coordination team, including my direct contact  number; reinforced need for caregiver and patient to begin process of calling about options for in-home vs facility care; confirmed caregiver report that patient has current long-term care insurance which he reports will not be available for benefit use until after he and patient have made definitive decision around care options and have established plan; reinforced previously provided education around  importance of skin care in setting of incontinence           SDOH assessments and interventions completed:  Yes  SDOH Interventions Today    Flowsheet Row Most Recent Value  SDOH Interventions   Food Insecurity Interventions Intervention Not Indicated  Transportation Interventions Intervention Not Indicated  [husband continues to provide transportation]       Care Coordination Interventions:  Yes, provided   Follow up plan: Follow up call scheduled for 09/20/22 with RN CM and 10/02/22 with LCSW    Encounter Outcome:  Pt. Visit Completed   Oneta Rack, RN, BSN, CCRN Alumnus RN CM Care Coordination/ Transition of [place] Management [phone]: direct office

## 2022-09-14 ENCOUNTER — Other Ambulatory Visit: Payer: Self-pay | Admitting: Neurology

## 2022-09-14 ENCOUNTER — Other Ambulatory Visit: Payer: Self-pay | Admitting: Internal Medicine

## 2022-09-19 ENCOUNTER — Encounter: Payer: Self-pay | Admitting: Internal Medicine

## 2022-09-20 ENCOUNTER — Ambulatory Visit: Payer: Self-pay

## 2022-09-20 NOTE — Patient Instructions (Signed)
Visit Information  Thank you for taking time to visit with me today. Please don't hesitate to contact me if I can be of assistance to you.   Following are the goals we discussed today:   Goals Addressed             This Visit's Progress    COMPLETED: Care Coordination Activities-no further follow up required       Care Coordination Interventions: Discussed family plan for move to [place]  Discussed fall prevention strategies Provided contact number and encouraged to contact as needed Reviewed upcoming/scheduled appointments Collaboration with LCSW to update on patient pending plans to move to [place]     If you are experiencing a Mental Health or [place] or need someone to talk to, please call the Suicide and Crisis Lifeline: [place], RN, MSN, BSN, [place] Coordinator [phone]

## 2022-09-20 NOTE — Patient Outreach (Signed)
  Care Coordination   Initial Visit Note   09/20/2022 Name: [name] MRN: [id number] DOB: [birth year]  [name] is a 82 y.o. year old female who sees Burns, Claudina Lick, MD for primary care. I spoke with  [name] and husband Myiesha Edgar by phone today.  What matters to the patients health and wellness today?  Mr. Simms reports they have decided for Mrs. Brandenburger to move to [facility] living or memory care unit, but he states they ar in favor of the assisted living due to social benefits). He reports that they have hired [employer] with [employer] to help with the support through the process. He expects Mrs. Hohensee to move within the next 2 weeks. RNCM contact information provided and encouraged to call with questions care coordination concerns as needed.   Goals Addressed             This Visit's Progress    COMPLETED: Care Coordination Activities-no further follow up required       Care Coordination Interventions: Discussed family plan for move to [place]  Discussed fall prevention strategies Provided contact number and encouraged to contact as needed Reviewed upcoming/scheduled appointments Collaboration with LCSW to update on patient pending plans to move to [place]     SDOH assessments and interventions completed:  No  Care Coordination Interventions:  Yes, provided   Follow up plan:  No follow up required by [facility]. LCSW will continue to follow    Encounter Outcome:  Pt. Visit Completed   Thea Silversmith, RN, MSN, BSN, [place] Coordinator [phone]

## 2022-09-25 ENCOUNTER — Ambulatory Visit (INDEPENDENT_AMBULATORY_CARE_PROVIDER_SITE_OTHER): Payer: Medicare Other

## 2022-09-25 DIAGNOSIS — Z23 Encounter for immunization: Secondary | ICD-10-CM

## 2022-09-25 DIAGNOSIS — Z111 Encounter for screening for respiratory tuberculosis: Secondary | ICD-10-CM

## 2022-09-25 NOTE — Progress Notes (Signed)
Tuberculin skin test applied to right ventral forearm. Pt tolerated well, and explained to pt she will come into office on Wednesday to have it read.

## 2022-09-26 ENCOUNTER — Telehealth: Payer: Self-pay | Admitting: Internal Medicine

## 2022-09-26 NOTE — Telephone Encounter (Signed)
Patient's spouse given original FL2 form and is to bring it back tomorrow for reading so it can be faxed.   Note placed with appointment.

## 2022-09-26 NOTE — Telephone Encounter (Signed)
For our records:  We have received FL-2 PW for the pt and it has been placed in Dr. [name] box.   Upon completion please fax to:  [phone]

## 2022-09-27 ENCOUNTER — Ambulatory Visit: Payer: Medicare Other

## 2022-09-27 LAB — TB SKIN TEST
Induration: 0 mm
TB Skin Test: NEGATIVE

## 2022-09-27 NOTE — Telephone Encounter (Signed)
Paperwork completed and faxed.  Spoke with spouse and copy mailed out today.

## 2022-09-27 NOTE — Telephone Encounter (Signed)
PT and spouse visit today with FL2 forms. FL2 forms have been placed in Dr.Burns' mailbox inside of a clear envelope.

## 2022-09-28 NOTE — Telephone Encounter (Signed)
Called and left message for Claiborne Billings to call me back so I can see what she needs regarding patient.  Please transfer her to me if she calls back.  Thanks

## 2022-09-28 NOTE — Telephone Encounter (Signed)
Kelly from [place] called back about this and some corrections were needed and asked for a call back at [phone]. She said that it was faxed back to [place] and emailed to [place]. She said the patient is trying to move in Monday 10/02/22 but can't without the corrections

## 2022-10-02 ENCOUNTER — Telehealth: Payer: Self-pay

## 2022-10-02 ENCOUNTER — Ambulatory Visit: Payer: Self-pay | Admitting: Licensed Clinical Social Worker

## 2022-10-02 DIAGNOSIS — I480 Paroxysmal atrial fibrillation: Secondary | ICD-10-CM

## 2022-10-02 DIAGNOSIS — F339 Major depressive disorder, recurrent, unspecified: Secondary | ICD-10-CM

## 2022-10-02 NOTE — Patient Outreach (Signed)
  Care Coordination   Follow Up Visit Note   10/02/2022 Name: NIZHONI PARLOW MRN: [id number] DOB: 07-13-1941  ESBEYDI MANAGO is a 82 y.o. year old female who sees Burns, Claudina Lick, MD for primary care. I spoke with [name], spouse of client by phone today.  What matters to the patients health and wellness today?  Client is moving today to [facility] in [place], [place]. Spouse of patient asked about transport resources list to be mailed to him to assist client as needed.    Goals Addressed             This Visit's Progress    Patient is planning to move toda to [facility] in [place], [place]. Spouse of patient asked about transport resources list to be mailed to him       Interventions LCSW talked today via phone with [name], spouse of client. [name] informed LCSW that client was moving today to ALF, [facility], in [place], [place]. [name] and LCSW spoke of transport resources.  LCSW mentioned to [name] that ALF would likely have some transport help for client to go to and from medical appointments. [name] asked if Care Guide could mail him list of transport resources in the area in case he needed to contact other transport resources for his wife. Discussed client needs. [name] said client needed help with daily ADLs.  Client is fall risk and uses walker to help with walking.   LCSW to send order to Care Guide requesting list of transport resources to be mailed to [name] to use as needed for transport help for client.       SDOH assessments and interventions completed:  Yes  SDOH Interventions Today    Flowsheet Row Most Recent Value  SDOH Interventions   Depression Interventions/Treatment  Medication  Physical Activity Interventions Other (Comments)  [risk for falls]        Care Coordination Interventions:  Yes, provided   Follow up plan: Follow up call scheduled for 10/31/22 at 2:30 PM     Encounter Outcome:  Pt. Visit Completed    Norva Riffle.Sollie Vultaggio MSW, [place] Holiday representative [facility] Care Management [phone]

## 2022-10-02 NOTE — Patient Instructions (Signed)
Visit Information  Thank you for taking time to visit with me today. Please don't hesitate to contact me if I can be of assistance to you before our next scheduled telephone appointment.  Following are the goals we discussed today:   Our next appointment is by telephone on 10/31/22 at 2:30 PM   Please call the care guide team at [phone] if you need to cancel or reschedule your appointment.   If you are experiencing a Mental Health or [place] or need someone to talk to, please go to [facility] [place] [phone])   Following is a copy of your full plan of care:   Interventions LCSW talked today via phone with [name], spouse of client. [name] informed LCSW that client was moving today to ALF, [facility], in [place], [place]. [name] and LCSW spoke of transport resources.  LCSW mentioned to [name] that ALF would likely have some transport help for client to go to and from medical appointments. [name] asked if Care Guide could mail him list of transport resources in the area in case he needed to contact other transport resources for his wife. Discussed client needs. [name] said client needed help with daily ADLs.  Client is fall risk and uses walker to help with walking.   LCSW to send order to Care Guide requesting list of transport resources to be mailed to [name] to use as needed for transport help for client.   Ms. Daughenbaugh was given information about Care Management services by the embedded care coordination team including:  Care Management services include personalized support from designated clinical staff supervised by her physician, including individualized plan of care and coordination with other care providers 24/7 contact phone numbers for assistance for urgent and routine care needs. The patient may stop CCM services at any time (effective at the end of the month) by phone call to the  office staff.  Patient agreed to services and verbal consent obtained.   Norva Riffle.Xyla Leisner MSW, [place] Holiday representative [facility] Care Management [phone]

## 2022-10-03 ENCOUNTER — Telehealth: Payer: Self-pay

## 2022-10-03 NOTE — Telephone Encounter (Signed)
   Telephone encounter was:  Unsuccessful.  10/03/2022 Name: [name] MRN: [id number] DOB: 1941-02-25  Unsuccessful outbound call made today to assist with:  Transportation Needs   Outreach Attempt:  1st Attempt  A HIPAA compliant voice message was left requesting a return call.  Instructed patient to call back at [phone].  [place] Resource Care Guide   ??[name]  ?? [phone]   Website: [email or web address]  [email or web address]

## 2022-10-04 ENCOUNTER — Telehealth: Payer: Self-pay

## 2022-10-04 NOTE — Telephone Encounter (Signed)
   Telephone encounter was:  Successful.  10/04/2022 Name: [name] MRN: [id number] DOB: 01-07-1941  [name] is a 82 y.o. year old female who is a primary care patient of Burns, Claudina Lick, MD . The community resource team was consulted for assistance with Transportation Needs   Care guide performed the following interventions: Spoke with patient's spouse [name] about  [facility] [place] [place] transportation. Verified home address to mail application and brochure.  Also informed [name] that the patient should also have a transportation benefit with [employer]. [name] stated he has the number and will call to verify.  Follow Up Plan:  No further follow up planned at this time. The patient has been provided with needed resources.  [place] Resource Care Guide   ??[email or web address]  ?? [phone]   Website: [email or web address]  [email or web address]

## 2022-10-05 ENCOUNTER — Other Ambulatory Visit: Payer: Self-pay | Admitting: Otolaryngology

## 2022-10-05 DIAGNOSIS — E079 Disorder of thyroid, unspecified: Secondary | ICD-10-CM | POA: Diagnosis not present

## 2022-10-10 ENCOUNTER — Telehealth: Payer: Self-pay | Admitting: Internal Medicine

## 2022-10-10 NOTE — Telephone Encounter (Signed)
[place]:  [name]   Phone Number:  [phone]   Needs Verbal orders for what service & frequency:  Order for home health physical and speech therapy for additional assessment.  Would like to use the diagnosis of parkinson for her orders

## 2022-10-10 NOTE — Telephone Encounter (Signed)
Okay for orders? 

## 2022-10-11 NOTE — Telephone Encounter (Signed)
Verbals left this morning

## 2022-10-11 NOTE — Telephone Encounter (Signed)
Faxed today

## 2022-10-11 NOTE — Telephone Encounter (Signed)
Please fax most recent doctors visit notes to :  [phone]

## 2022-10-13 DIAGNOSIS — I480 Paroxysmal atrial fibrillation: Secondary | ICD-10-CM | POA: Diagnosis not present

## 2022-10-13 DIAGNOSIS — F0283 Dementia in other diseases classified elsewhere, unspecified severity, with mood disturbance: Secondary | ICD-10-CM | POA: Diagnosis not present

## 2022-10-13 DIAGNOSIS — E1142 Type 2 diabetes mellitus with diabetic polyneuropathy: Secondary | ICD-10-CM | POA: Diagnosis not present

## 2022-10-13 DIAGNOSIS — F32A Depression, unspecified: Secondary | ICD-10-CM | POA: Diagnosis not present

## 2022-10-13 DIAGNOSIS — G20A1 Parkinson's disease without dyskinesia, without mention of fluctuations: Secondary | ICD-10-CM | POA: Diagnosis not present

## 2022-10-17 ENCOUNTER — Telehealth: Payer: Self-pay | Admitting: Internal Medicine

## 2022-10-17 NOTE — Telephone Encounter (Signed)
[place] Name: [facility] Agency Name: Santina Evans Phone #: [phone] secure  Service Requested: PT (examples: OT/PT/Skilled Nursing/Social Work/Speech Therapy/Wound Care)  Frequency of Visits: 1x a week for 6 weeks

## 2022-10-17 NOTE — Telephone Encounter (Signed)
Okay for orders? 

## 2022-10-17 NOTE — Telephone Encounter (Signed)
Verbals given today. °

## 2022-10-18 DIAGNOSIS — E1142 Type 2 diabetes mellitus with diabetic polyneuropathy: Secondary | ICD-10-CM | POA: Diagnosis not present

## 2022-10-18 DIAGNOSIS — I480 Paroxysmal atrial fibrillation: Secondary | ICD-10-CM | POA: Diagnosis not present

## 2022-10-18 DIAGNOSIS — G20A1 Parkinson's disease without dyskinesia, without mention of fluctuations: Secondary | ICD-10-CM | POA: Diagnosis not present

## 2022-10-18 DIAGNOSIS — F0283 Dementia in other diseases classified elsewhere, unspecified severity, with mood disturbance: Secondary | ICD-10-CM | POA: Diagnosis not present

## 2022-10-18 DIAGNOSIS — F32A Depression, unspecified: Secondary | ICD-10-CM | POA: Diagnosis not present

## 2022-10-23 ENCOUNTER — Encounter: Payer: Self-pay | Admitting: Internal Medicine

## 2022-10-23 ENCOUNTER — Other Ambulatory Visit: Payer: Self-pay | Admitting: Internal Medicine

## 2022-10-23 DIAGNOSIS — F32A Depression, unspecified: Secondary | ICD-10-CM | POA: Diagnosis not present

## 2022-10-23 DIAGNOSIS — G20A1 Parkinson's disease without dyskinesia, without mention of fluctuations: Secondary | ICD-10-CM | POA: Diagnosis not present

## 2022-10-23 DIAGNOSIS — I480 Paroxysmal atrial fibrillation: Secondary | ICD-10-CM | POA: Diagnosis not present

## 2022-10-23 DIAGNOSIS — E1142 Type 2 diabetes mellitus with diabetic polyneuropathy: Secondary | ICD-10-CM | POA: Diagnosis not present

## 2022-10-23 DIAGNOSIS — F0283 Dementia in other diseases classified elsewhere, unspecified severity, with mood disturbance: Secondary | ICD-10-CM | POA: Diagnosis not present

## 2022-10-23 MED ORDER — METFORMIN HCL ER 750 MG PO TB24: 750.0000 mg | ORAL_TABLET | Freq: Every day | ORAL | 3 refills | Status: DC

## 2022-10-24 DIAGNOSIS — I480 Paroxysmal atrial fibrillation: Secondary | ICD-10-CM | POA: Diagnosis not present

## 2022-10-24 DIAGNOSIS — F0283 Dementia in other diseases classified elsewhere, unspecified severity, with mood disturbance: Secondary | ICD-10-CM | POA: Diagnosis not present

## 2022-10-24 DIAGNOSIS — F32A Depression, unspecified: Secondary | ICD-10-CM | POA: Diagnosis not present

## 2022-10-24 DIAGNOSIS — E1142 Type 2 diabetes mellitus with diabetic polyneuropathy: Secondary | ICD-10-CM | POA: Diagnosis not present

## 2022-10-24 DIAGNOSIS — G20A1 Parkinson's disease without dyskinesia, without mention of fluctuations: Secondary | ICD-10-CM | POA: Diagnosis not present

## 2022-10-25 ENCOUNTER — Telehealth: Payer: Self-pay | Admitting: Internal Medicine

## 2022-10-25 DIAGNOSIS — Z9181 History of falling: Secondary | ICD-10-CM | POA: Diagnosis not present

## 2022-10-25 DIAGNOSIS — Z853 Personal history of malignant neoplasm of breast: Secondary | ICD-10-CM | POA: Diagnosis not present

## 2022-10-25 DIAGNOSIS — Z7982 Long term (current) use of aspirin: Secondary | ICD-10-CM | POA: Diagnosis not present

## 2022-10-25 DIAGNOSIS — I480 Paroxysmal atrial fibrillation: Secondary | ICD-10-CM | POA: Diagnosis not present

## 2022-10-25 DIAGNOSIS — G20A1 Parkinson's disease without dyskinesia, without mention of fluctuations: Secondary | ICD-10-CM | POA: Diagnosis not present

## 2022-10-25 DIAGNOSIS — Z7984 Long term (current) use of oral hypoglycemic drugs: Secondary | ICD-10-CM | POA: Diagnosis not present

## 2022-10-25 DIAGNOSIS — I10 Essential (primary) hypertension: Secondary | ICD-10-CM | POA: Diagnosis not present

## 2022-10-25 DIAGNOSIS — F32A Depression, unspecified: Secondary | ICD-10-CM

## 2022-10-25 DIAGNOSIS — E1142 Type 2 diabetes mellitus with diabetic polyneuropathy: Secondary | ICD-10-CM | POA: Diagnosis not present

## 2022-10-25 DIAGNOSIS — F0283 Dementia in other diseases classified elsewhere, unspecified severity, with mood disturbance: Secondary | ICD-10-CM | POA: Diagnosis not present

## 2022-10-25 NOTE — Telephone Encounter (Signed)
Katie from [facility] called stated that the patients social worker wants to make sure patient is on the correct dosage of metformin '750mg'$ , 1 pill in the morning and 1 pill at night. Joellen Jersey can be reached at [phone]

## 2022-10-25 NOTE — Telephone Encounter (Signed)
Spoke with Joellen Jersey and updated med list faxed and conformation received.

## 2022-10-26 ENCOUNTER — Ambulatory Visit
Admission: RE | Admit: 2022-10-26 | Discharge: 2022-10-26 | Disposition: A | Discharge status: Home / Self Care | Payer: Medicare Other | Source: Ambulatory Visit | Attending: Otolaryngology | Admitting: Otolaryngology

## 2022-10-26 ENCOUNTER — Other Ambulatory Visit (HOSPITAL_COMMUNITY)
Admission: RE | Admit: 2022-10-26 | Discharge: 2022-10-26 | Disposition: A | Discharge status: Home / Self Care | Payer: Medicare Other | Source: Ambulatory Visit | Attending: Otolaryngology | Admitting: Otolaryngology

## 2022-10-26 DIAGNOSIS — E041 Nontoxic single thyroid nodule: Secondary | ICD-10-CM | POA: Diagnosis not present

## 2022-10-26 DIAGNOSIS — D440 Neoplasm of uncertain behavior of thyroid gland: Secondary | ICD-10-CM | POA: Insufficient documentation

## 2022-10-26 DIAGNOSIS — E079 Disorder of thyroid, unspecified: Secondary | ICD-10-CM

## 2022-10-29 ENCOUNTER — Encounter: Payer: Self-pay | Admitting: Internal Medicine

## 2022-10-29 NOTE — Progress Notes (Unsigned)
Virtual Visit via Video Note  I connected with [name] on 10/29/22 at  1:00 PM EST by a video enabled telemedicine application and verified that I am speaking with the correct person using two identifiers.   I discussed the limitations of evaluation and management by telemedicine and the availability of in person appointments. The patient expressed understanding and agreed to proceed.  Present for the visit:  Myself, Dr Billey Gosling, Ihor Gully.  The patient is currently at home and I am in the office.    No referring provider.    History of Present Illness: She is here for an acute visit.  She needs evaluation for PT.      ROS    Social History   Socioeconomic History   Marital status: Married    Spouse name: Not on file   Number of children: 4   Years of education: Not on file   Highest education level: Not on file  Occupational History   Occupation: retired  Tobacco Use   Smoking status: Former    Types: Cigarettes    Quit date: 09/18/1977    Years since quitting: 45.1   Smokeless tobacco: Never   Tobacco comments:    smoked 1957-1979 , up to 1 ppd  Vaping Use   Vaping Use: Never used  Substance and Sexual Activity   Alcohol use: No    Comment:  very rarely   Drug use: No   Sexual activity: Not Currently    Birth control/protection: Surgical  Other Topics Concern   Not on file  Social History Narrative   Walking for exercise   Social Determinants of Health   Financial Resource Strain: Low Risk  (10/17/2021)   Overall Financial Resource Strain (CARDIA)    Difficulty of Paying Living Expenses: Not hard at all  Food Insecurity: No [place] (08/31/2022)   Hunger Vital Sign    Worried About Running Out of Food in the Last Year: Never true    [place] in the Last Year: Never true  Transportation Needs: No Transportation Needs (10/04/2022)   PRAPARE - Hydrologist (Medical): No    Lack of Transportation  (Non-Medical): No  Physical Activity: Inactive (10/02/2022)   Exercise Vital Sign    Days of Exercise per Week: 0 days    Minutes of Exercise per Session: 0 min  Stress: Stress Concern Present (08/28/2022)   [place]    Feeling of Stress : To some extent  Social Connections: Moderately Isolated (10/17/2021)   Social Connection and Isolation Panel [NHANES]    Frequency of Communication with Friends and Family: Three times a week    Frequency of Social Gatherings with Friends and Family: Three times a week    Attends Religious Services: Never    Active Member of Clubs or Organizations: No    Attends Archivist Meetings: Never    Marital Status: Married     Observations/Objective: Appears well in NAD   Assessment and Plan:  See Problem List for Assessment and Plan of chronic medical problems.   Follow Up Instructions:    I discussed the assessment and treatment plan with the patient. The patient was provided an opportunity to ask questions and all were answered. The patient agreed with the plan and demonstrated an understanding of the instructions.   The patient was advised to call back or seek an in-person evaluation if the  symptoms worsen or if the condition fails to improve as anticipated.    Binnie Rail, MD

## 2022-10-30 ENCOUNTER — Telehealth (INDEPENDENT_AMBULATORY_CARE_PROVIDER_SITE_OTHER): Payer: Medicare Other | Admitting: Internal Medicine

## 2022-10-30 DIAGNOSIS — I10 Essential (primary) hypertension: Secondary | ICD-10-CM | POA: Diagnosis not present

## 2022-10-30 DIAGNOSIS — G20A1 Parkinson's disease without dyskinesia, without mention of fluctuations: Secondary | ICD-10-CM | POA: Diagnosis not present

## 2022-10-30 LAB — CYTOLOGY - NON PAP

## 2022-10-30 NOTE — Assessment & Plan Note (Signed)
Chronic Management per neurology [place] Would benefit greatly from PT, ST Also needs a manual wheelchair-given her decreased endurance and physical deconditioning/poor balance related to Parkinson's disease she is not able to walk a distance to the dining hall and requires a manual wheelchair to get there.  As stated a walker, crutch or cane would not allow her to travel that distance-only a manual wheelchair will be effective.

## 2022-10-30 NOTE — Assessment & Plan Note (Signed)
Chronic Blood pressure has been well-controlled when monitored Continue amlodipine 5 mg daily, Coreg 25 mg twice daily

## 2022-10-31 ENCOUNTER — Ambulatory Visit: Payer: Self-pay | Admitting: Licensed Clinical Social Worker

## 2022-10-31 NOTE — Patient Outreach (Signed)
  Care Coordination   Follow Up Visit Note   10/31/2022 Name: [name] MRN: [id number] DOB: [birth year]  [name] is a 82 y.o. year old female who sees Burns, Carol Lick, MD for primary care. I spoke with [name], spouse of client by phone today about client needs.  What matters to the patients health and wellness today?   Patient now resides at ALF. She is interested in receiving physical therapy at ALF facility    Goals Addressed             This Visit's Progress    Patient is planning to move toda to [facility] in [place], [place]. Spouse of patient asked about transport resources list to be mailed to him       Interventions LCSW talked today via phone with [name], spouse of client. [name] informed LCSW that client now resides at [facility] in [place], [place]. Client is fall risk and uses walker to help with walking.  [name] informed LCSW that Dr. Quay Meyers PCP has ordered HHPT and a manual wheelchair for client  Discussed client adjustment to facility. [name] feels that client is adjusting well to care received by client at [facility] appreciative of call today from LCSW      SDOH assessments and interventions completed:  Yes  SDOH Interventions Today    Flowsheet Row Most Recent Value  SDOH Interventions   Depression Interventions/Treatment  Medication  Physical Activity Interventions Other (Comments)  [client has some walking challenges]  Stress Interventions Other (Comment)  [client has stress related to managing medical needs]        Care Coordination Interventions:  Yes, provided   Interventions Today    Flowsheet Row Most Recent Value  Chronic Disease   Chronic disease during today's visit Other  [balance issues,  walking challenges]  General Interventions   General Interventions Discussed/Reviewed General Interventions Discussed  [discussed client support at ALF]  Education Interventions   Education Provided Provided  Education  Provided Verbal Education On Mental Health/Coping with Illness  [discussed client adjustment to residing at ALF]  [place] Discussed/Reviewed Depression        Follow up plan: Follow up call scheduled for 12/11/22 at 2:00 PM    Encounter Outcome:  Pt. Visit Completed   [name].[name] MSW, [place] Holiday representative [facility] Care Management [phone]

## 2022-10-31 NOTE — Patient Instructions (Signed)
Visit Information  Thank you for taking time to visit with me today. Please don't hesitate to contact me if I can be of assistance to you before our next scheduled telephone appointment.  Following are the goals we discussed today:    Our next appointment is by telephone on 12/11/22 at 2:00 PM   Please call the care guide team at [phone] if you need to cancel or reschedule your appointment.   If you are experiencing a Mental Health or [place] or need someone to talk to, please go to [facility] [phone])   Following is a copy of your full plan of care:   Interventions LCSW talked today via phone with [name], spouse of client. [name] informed LCSW that client now resides at [facility] in [place], [place]. Client is fall risk and uses walker to help with walking.  [name] informed LCSW that Dr. [name] PCP has ordered HHPT and a manual wheelchair for client  Discussed client adjustment to facility. [name] feels that client is adjusting well to care received by client at [facility] appreciative of call today from LCSW   Ms. Hagman was given information about Care Management services by the embedded care coordination team including:  Care Management services include personalized support from designated clinical staff supervised by her physician, including individualized plan of care and coordination with other care providers 24/7 contact phone numbers for assistance for urgent and routine care needs. The patient may stop CCM services at any time (effective at the end of the month) by phone call to the office staff.  Patient agreed to services and verbal consent obtained.   Norva Riffle.Krystyl Cannell MSW, [place] Holiday representative [facility] Care Management [phone]

## 2022-11-01 DIAGNOSIS — F32A Depression, unspecified: Secondary | ICD-10-CM | POA: Diagnosis not present

## 2022-11-01 DIAGNOSIS — F0283 Dementia in other diseases classified elsewhere, unspecified severity, with mood disturbance: Secondary | ICD-10-CM | POA: Diagnosis not present

## 2022-11-01 DIAGNOSIS — G20A1 Parkinson's disease without dyskinesia, without mention of fluctuations: Secondary | ICD-10-CM | POA: Diagnosis not present

## 2022-11-01 DIAGNOSIS — I480 Paroxysmal atrial fibrillation: Secondary | ICD-10-CM | POA: Diagnosis not present

## 2022-11-01 DIAGNOSIS — E1142 Type 2 diabetes mellitus with diabetic polyneuropathy: Secondary | ICD-10-CM | POA: Diagnosis not present

## 2022-11-03 DIAGNOSIS — E079 Disorder of thyroid, unspecified: Secondary | ICD-10-CM | POA: Diagnosis not present

## 2022-11-03 DIAGNOSIS — D440 Neoplasm of uncertain behavior of thyroid gland: Secondary | ICD-10-CM | POA: Diagnosis not present

## 2022-11-07 DIAGNOSIS — L84 Corns and callosities: Secondary | ICD-10-CM | POA: Diagnosis not present

## 2022-11-07 DIAGNOSIS — M79675 Pain in left toe(s): Secondary | ICD-10-CM | POA: Diagnosis not present

## 2022-11-07 DIAGNOSIS — E1151 Type 2 diabetes mellitus with diabetic peripheral angiopathy without gangrene: Secondary | ICD-10-CM | POA: Diagnosis not present

## 2022-11-07 DIAGNOSIS — M2011 Hallux valgus (acquired), right foot: Secondary | ICD-10-CM | POA: Diagnosis not present

## 2022-11-07 DIAGNOSIS — B351 Tinea unguium: Secondary | ICD-10-CM | POA: Diagnosis not present

## 2022-11-08 DIAGNOSIS — F32A Depression, unspecified: Secondary | ICD-10-CM | POA: Diagnosis not present

## 2022-11-08 DIAGNOSIS — E1142 Type 2 diabetes mellitus with diabetic polyneuropathy: Secondary | ICD-10-CM | POA: Diagnosis not present

## 2022-11-08 DIAGNOSIS — I480 Paroxysmal atrial fibrillation: Secondary | ICD-10-CM | POA: Diagnosis not present

## 2022-11-08 DIAGNOSIS — F0283 Dementia in other diseases classified elsewhere, unspecified severity, with mood disturbance: Secondary | ICD-10-CM | POA: Diagnosis not present

## 2022-11-08 DIAGNOSIS — G20A1 Parkinson's disease without dyskinesia, without mention of fluctuations: Secondary | ICD-10-CM | POA: Diagnosis not present

## 2022-11-13 ENCOUNTER — Emergency Department (HOSPITAL_COMMUNITY): Payer: Medicare Other

## 2022-11-13 ENCOUNTER — Emergency Department (HOSPITAL_COMMUNITY)
Admission: EM | Admit: 2022-11-13 | Discharge: 2022-11-13 | Disposition: A | Discharge status: Skilled Nursing Facility | Payer: Medicare Other | Attending: Emergency Medicine | Admitting: Emergency Medicine

## 2022-11-13 DIAGNOSIS — Z23 Encounter for immunization: Secondary | ICD-10-CM | POA: Insufficient documentation

## 2022-11-13 DIAGNOSIS — Z9013 Acquired absence of bilateral breasts and nipples: Secondary | ICD-10-CM | POA: Insufficient documentation

## 2022-11-13 DIAGNOSIS — Z853 Personal history of malignant neoplasm of breast: Secondary | ICD-10-CM | POA: Diagnosis not present

## 2022-11-13 DIAGNOSIS — I129 Hypertensive chronic kidney disease with stage 1 through stage 4 chronic kidney disease, or unspecified chronic kidney disease: Secondary | ICD-10-CM | POA: Diagnosis not present

## 2022-11-13 DIAGNOSIS — Z87891 Personal history of nicotine dependence: Secondary | ICD-10-CM | POA: Insufficient documentation

## 2022-11-13 DIAGNOSIS — Z7984 Long term (current) use of oral hypoglycemic drugs: Secondary | ICD-10-CM | POA: Insufficient documentation

## 2022-11-13 DIAGNOSIS — E1122 Type 2 diabetes mellitus with diabetic chronic kidney disease: Secondary | ICD-10-CM | POA: Diagnosis not present

## 2022-11-13 DIAGNOSIS — S51012A Laceration without foreign body of left elbow, initial encounter: Secondary | ICD-10-CM | POA: Insufficient documentation

## 2022-11-13 DIAGNOSIS — Z79899 Other long term (current) drug therapy: Secondary | ICD-10-CM | POA: Diagnosis not present

## 2022-11-13 DIAGNOSIS — S0003XA Contusion of scalp, initial encounter: Secondary | ICD-10-CM | POA: Insufficient documentation

## 2022-11-13 DIAGNOSIS — M25522 Pain in left elbow: Secondary | ICD-10-CM | POA: Diagnosis not present

## 2022-11-13 DIAGNOSIS — W19XXXA Unspecified fall, initial encounter: Secondary | ICD-10-CM | POA: Insufficient documentation

## 2022-11-13 DIAGNOSIS — S199XXA Unspecified injury of neck, initial encounter: Secondary | ICD-10-CM | POA: Diagnosis not present

## 2022-11-13 DIAGNOSIS — S0101XA Laceration without foreign body of scalp, initial encounter: Secondary | ICD-10-CM | POA: Diagnosis not present

## 2022-11-13 DIAGNOSIS — Z743 Need for continuous supervision: Secondary | ICD-10-CM | POA: Diagnosis not present

## 2022-11-13 DIAGNOSIS — Z7982 Long term (current) use of aspirin: Secondary | ICD-10-CM | POA: Diagnosis not present

## 2022-11-13 DIAGNOSIS — N183 Chronic kidney disease, stage 3 unspecified: Secondary | ICD-10-CM | POA: Insufficient documentation

## 2022-11-13 DIAGNOSIS — S0990XA Unspecified injury of head, initial encounter: Secondary | ICD-10-CM | POA: Diagnosis not present

## 2022-11-13 DIAGNOSIS — R609 Edema, unspecified: Secondary | ICD-10-CM | POA: Diagnosis not present

## 2022-11-13 DIAGNOSIS — G20A1 Parkinson's disease without dyskinesia, without mention of fluctuations: Secondary | ICD-10-CM | POA: Diagnosis not present

## 2022-11-13 MED ORDER — LIDOCAINE HCL (PF) 1 % IJ SOLN
5.0000 mL | Freq: Once | INTRAMUSCULAR | Status: DC
  Filled 2022-11-13: qty 30

## 2022-11-13 MED ORDER — TETANUS-DIPHTH-ACELL PERTUSSIS 5-2.5-18.5 LF-MCG/0.5 IM SUSY
0.5000 mL | PREFILLED_SYRINGE | Freq: Once | INTRAMUSCULAR | Status: AC
  Administered 2022-11-13: 0.5 mL via INTRAMUSCULAR
  Filled 2022-11-13: qty 0.5

## 2022-11-13 NOTE — Discharge Instructions (Signed)
We evaluated you after your fall.  Your CT scans and x-rays were negative.  We placed sutures in your left elbow.  Please have these removed in approximately 10 to 14 days.  Please be careful to avoid future falls.  If you develop any new pain, recurrent falls, severe headaches, vomiting, vision changes, or any other concerning symptoms, please return to the emergency department.

## 2022-11-13 NOTE — ED Triage Notes (Signed)
Patient BIB EMS from [facility], had a mechanical fall. Hit back of her head, no LOC, no laceration, does have a knot to back of head. A/O x 4.

## 2022-11-13 NOTE — ED Notes (Signed)
Husband contacted for transport and stated he will arrived in approx 20 mins

## 2022-11-13 NOTE — ED Notes (Signed)
Dc instructions reviewed with pt spouse. Pt to have sutures removed in 10-14 days.

## 2022-11-13 NOTE — ED Provider Notes (Signed)
[place] Provider Note  CSN: [id number] Arrival date & time: 11/13/22 1009  Chief Complaint(s) No chief complaint on file.  HPI [name] is a 82 y.o. female history of breast cancer in remission, hypertension, hyperlipidemia, diabetes presenting to the emergency department after fall.  Patient reports she was at her assisted living facility, lost balance and fell.  She hit the back of her head.  Patient denies loss of consciousness.  No neck pain.  She reports some bleeding to her left elbow but denies left elbow pain.  Denies blood thinner use.  Denies pain anywhere currently.  Has been ambulatory since the fall.   Past Medical History Past Medical History:  Diagnosis Date   Acute renal failure superimposed on stage 3 chronic kidney disease ([place]) 01/31/2020   Breast cancer ([place]) 2012   Cancer of central portion of right female breast ([place]) 09/04/2010   Patient has a long history of fibrocystic disease. 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 She underwent right total mastectomy with sentinel node biopsy and left total (prophylactic) mastectomy on 10/12/2010. She underwent immediate reconstruction with expander and AlloDerm placement. 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 One lymph   Complicated UTI (urinary tract infection) 01/31/2020   Diabetes mellitus    Dizziness 02/24/2019   Elevated LFTs 08/10/2014   Episodic lightheadedness 08/27/2019   Esophageal reflux 05/06/2008   Essential hypertension 02/16/2009   Family history of breast cancer    Family history of ovarian cancer    Fatigue 02/24/2019   Fatty liver 08/05/2015   Genetic testing 09/04/2017   Negative genetic testing on the common hereditary cancer panel.  The Hereditary Gene Panel offered by [facility] includes sequencing and/or deletion duplication testing of the following 47 genes: APC, ATM, AXIN2, BARD1,  BMPR1A, BRCA1, BRCA2, BRIP1, CDH1, CDK4, CDKN2A (p14ARF), CDKN2A (p16INK4a), CHEK2, CTNNA1, DICER1, EPCAM (Deletion/duplication testing only), GREM1 (promoter region deletion/duplicat   GERD (gastroesophageal reflux disease)    [name] syndrome    GILBERT'S SYNDROME 02/06/2007   Qualifier: Diagnosis of  By: Janelle Floor     History of breast cancer 08/04/2015   S/p b/l mastectomy   History of colonic polyps 06/06/2010   Annotation: destroyed, no clear adenomatous proven Qualifier: Diagnosis of  By: Carlean Purl MD, Tonna Boehringer E  2008 polyp 2013 neg     Hypertension    Hypertriglyceridemia 09/30/2007   statin intolerant  Father MI @ 9 Sister CVA > 65    Hypokalemia due to inadequate potassium intake 01/31/2020   IBS (irritable bowel syndrome)    Joint pain 09/02/2010   Lactic acidosis 01/31/2020   Osteoarthritis of left knee 08/04/2015   Significant arthritis in knees, limits her activity    Osteopenia 08/31/2013   [facility]  DEXA 03/20/2018: Osteopenia:  LFN -1.3, R--1.0, spine -0.6-statistically significant increase in BMD in all areas  Findings 08/22/13 : lowest T score -  1.3  @  R femoral neck (hip) ,3.4  %loss @ R femur &   4.1 % in spine   since  2012 Dexa 01/05/16: lowest  - L femur neck T -1.6 Diagnosis: mild Osteopenia Rx: Fosamax remotely; now on Arimidex    Parkinsonian features 01/31/2020   Peripheral neuropathy 08/23/2018   Plantar fasciitis of right foot    Dr Oneta Rack   Poor balance 02/20/2018   S/P bilateral mastectomy 08/04/2015   Septic shock ([place]) 01/31/2020   Severe sepsis  with acute organ dysfunction due to gram-negative bacteria ([place]) 01/31/2020   Transfusion history 1976   Type 2 diabetes mellitus with stage 3 chronic kidney disease, without long-term current use of insulin ([place]) 08/11/2008   Ophth, Dr Kathrin Penner: no retinopathy  Diabetes maternal grandmother   Patient Active Problem List   Diagnosis Date Noted   Recurrent falls 08/29/2022   Laceration of head 08/29/2022    Depression 07/31/2022   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   Urinary incontinence 07/31/2022   UTI (urinary tract infection) 01/17/2021   Aortic atherosclerosis ([place]) 08/27/2020   history of Hepatotoxicity due to statin drug 08/27/2020   Parkinson disease 08/26/2020   Paroxysmal atrial fibrillation ([place]) [id number]   Complicated UTI (urinary tract infection) 01/31/2020   CKD (chronic kidney disease) stage 3, GFR 30-59 ml/min (HCC) 01/31/2020   Peripheral neuropathy 08/23/2018   Genetic testing 09/04/2017   Family history of breast cancer    Family history of ovarian cancer    Fatty liver 08/05/2015   History of breast cancer 08/04/2015   S/P bilateral mastectomy 08/04/2015   Osteoarthritis of left knee 08/04/2015   Osteopenia 08/31/2013   Transfusion history 08/18/2013   IBS (irritable bowel syndrome) 08/18/2013   Cancer of central portion of right female breast ([place]) 09/04/2010   History of colonic polyps 06/06/2010   Essential hypertension 02/16/2009   Type 2 diabetes mellitus with stage 3 chronic kidney disease, without long-term current use of insulin ([place]) 08/11/2008   Hypertriglyceridemia 09/30/2007   GILBERT'S SYNDROME 02/06/2007   Home Medication(s) Prior to Admission medications   Medication Sig Start Date End Date Taking? Authorizing Provider  amLODipine (NORVASC) 5 MG tablet TAKE 1 TABLET BY MOUTH EVERY DAY FOR HYPERTENSION 10/23/22   Binnie Rail, MD  aspirin 81 MG chewable tablet Chew 1 tablet (81 mg total) by mouth daily. 02/07/20   Matcha, Beverely Pace, MD  carbidopa-levodopa (SINEMET IR) 25-100 MG tablet Take 1.5 tablets by mouth 4 (four) times daily. 09/14/22   Tat, Eustace Quail, DO  carvedilol (COREG) 25 MG tablet TAKE 1 TABLET BY MOUTH 2 TIMES A DAY FOR HYPERTENSION 10/23/22   Binnie Rail, MD  Cyanocobalamin (VITAMIN B-12) 5000 MCG SUBL Place 5,000 mcg under the tongue daily.     [provider]  escitalopram (LEXAPRO) 5 MG tablet TAKE 1 TABLET BY MOUTH EVERY DAY FOR MOOD  10/23/22   Binnie Rail, MD  metFORMIN (GLUCOPHAGE-XR) 750 MG 24 hr tablet Take 1 tablet (750 mg total) by mouth daily with supper. 10/23/22   Binnie Rail, MD  Multiple Vitamin (MULTIVITAMIN WITH MINERALS) TABS tablet Take 1 tablet by mouth daily.    [provider]  Omega-3 Fatty Acids (FISH OIL CONCENTRATE) 1000 MG CAPS Take 1,000 mg by mouth 2 (two) times daily.    [provider]  Past Surgical History Past Surgical History:  Procedure Laterality Date   ABDOMINAL HYSTERECTOMY  1976   with BSO due to infection from [facility] IUD   [place]   @ [facility] & BSO   BREAST LUMPECTOMY     [id number], [phone]   CATARACT EXTRACTION, BILATERAL Bilateral 2018   COLONOSCOPY W/ POLYPECTOMY  2006   negative 2011; Dr Carlean Purl   MASTECTOMY W/ NODES PARTIAL  2012   double mastectomy with nodes taken out on right side    PLACEMENT OF BREAST IMPLANTS  04/2011   Dr Migdalia Dk, [facility]   TONSILLECTOMY AND ADENOIDECTOMY     Family History Family History  Problem Relation Age of Onset   Heart attack Father 36   Breast cancer Sister 30        bilateral    Heart failure Sister        PMH intensive chemotherapy   Hypertension Sister    Stroke Sister 6   Stroke Mother        TMI   Breast cancer Maternal Aunt 10   Diabetes Maternal Grandmother    Ovarian cancer Paternal Grandmother    Breast cancer Maternal Aunt 77   Colon cancer Neg Hx    Stomach cancer Neg Hx    Esophageal cancer Neg Hx    Pancreatic cancer Neg Hx    Liver disease Neg Hx     Social History Social History   Tobacco Use   Smoking status: Former    Types: Cigarettes    Quit date: 09/18/1977    Years since quitting: 45.1   Smokeless tobacco: Never   Tobacco comments:    smoked 1957-1979 , up to 1 ppd  Vaping Use   Vaping Use: Never used  Substance Use Topics    Alcohol use: No    Comment:  very rarely   Drug use: No   Allergies Elemental sulfur, Exemestane, Morphine and related, Statins, Ace inhibitors, and Codeine  Review of Systems Review of Systems  All other systems reviewed and are negative.   Physical Exam Vital Signs  I have reviewed the triage vital signs BP (!) 151/56   Pulse 81   Temp 97.6 F (36.4 C) (Oral)   Resp 18   SpO2 98%  Physical Exam Vitals and nursing note reviewed.  Constitutional:      General: She is not in acute distress.    Appearance: She is well-developed.  HENT:     Head: Normocephalic.     Comments: Small hematoma to the left posterior scalp    Mouth/Throat:     Mouth: Mucous membranes are moist.  Eyes:     Pupils: Pupils are equal, round, and reactive to light.  Cardiovascular:     Rate and Rhythm: Normal rate and regular rhythm.     Heart sounds: No murmur heard. Pulmonary:     Effort: Pulmonary effort is normal. No respiratory distress.     Breath sounds: Normal breath sounds.  Abdominal:     General: Abdomen is flat.     Palpations: Abdomen is soft.     Tenderness: There is no abdominal tenderness.  Musculoskeletal:        General: No tenderness.     Right lower leg: No edema.     Left lower leg: No edema.     Comments: No midline C, T, L-spine tenderness.  No injury or deformity to the bilateral upper and lower extremities with full range of motion throughout.  Specifically, no tenderness over the left elbow  Skin:    General: Skin is warm and dry.     Comments: Approximately 1 x 1 cm skin tear to the left elbow  Neurological:     General: No focal deficit present.     Mental Status: She is alert and oriented to person, place, and time. Mental status is at baseline.  Psychiatric:        Mood and Affect: Mood normal.        Behavior: Behavior normal.     ED Results and Treatments Labs (all labs ordered are listed, but only abnormal results are displayed) Labs Reviewed - No  data to display                                                                                                                        Radiology DG Elbow Complete Left  Result Date: 11/13/2022 CLINICAL DATA:  Fall, elbow pain EXAM: LEFT ELBOW - COMPLETE 3+ VIEW COMPARISON:  None Available. FINDINGS: There is no evidence of fracture, dislocation, or joint effusion. There is no evidence of arthropathy or other focal bone abnormality. Soft tissue laceration over the olecranon. IMPRESSION: 1. No fracture or dislocation of the left elbow. No elbow joint effusion to suggest radiographically occult fracture. 2.  Soft tissue laceration over the olecranon. Electronically Signed   By: Delanna Ahmadi M.D.   On: 11/13/2022 11:02   CT Head Wo Contrast  Result Date: 11/13/2022 CLINICAL DATA:  Neck trauma (Age >= 65y); Head trauma, minor (Age >= 65y) EXAM: CT HEAD WITHOUT CONTRAST CT CERVICAL SPINE WITHOUT CONTRAST TECHNIQUE: Multidetector CT imaging of the head and cervical spine was performed following the standard protocol without intravenous contrast. Multiplanar CT image reconstructions of the cervical spine were also generated. RADIATION DOSE REDUCTION: This exam was performed according to the departmental dose-optimization program which includes automated exposure control, adjustment of the mA and/or kV according to patient size and/or use of iterative reconstruction technique. COMPARISON:  CT head and cervical spine 08/19/2022. FINDINGS: CT HEAD FINDINGS Brain: No evidence of acute infarction, hemorrhage, hydrocephalus, extra-axial collection or mass lesion/mass effect. Vascular: No hyperdense vessel. Skull: No acute fracture. Sinuses/Orbits: Opacified right posterior ethmoid air cell. Otherwise, clear sinuses. Other: No mastoid effusions. CT CERVICAL SPINE FINDINGS Alignment: Anterolisthesis of C4 on C5, favored degenerative given facet arthropathy at this level and similar alignment on the prior. Skull base and  vertebrae: Mild height loss of an upper thoracic vertebral bodies unchanged L2 to the prior. No evidence of new vertebral body height loss or acute fracture. Osteopenia. Soft tissues and spinal canal: No prevertebral fluid or swelling. No visible canal hematoma. Disc levels: Similar multilevel degenerative change including facet uncovertebral hypertrophy 3 degrees of neural foraminal stenosis, greatest on the left C5-C6. Upper chest: Visualized lung apices are clear. Large left thyroid nodule further characterized on prior ultrasound and biopsy from October 26, 2022. IMPRESSION: No acute findings intracranially or in the cervical spine.  Electronically Signed   By: Margaretha Sheffield M.D.   On: 11/13/2022 10:54   CT Cervical Spine Wo Contrast  Result Date: 11/13/2022 CLINICAL DATA:  Neck trauma (Age >= 65y); Head trauma, minor (Age >= 65y) EXAM: CT HEAD WITHOUT CONTRAST CT CERVICAL SPINE WITHOUT CONTRAST TECHNIQUE: Multidetector CT imaging of the head and cervical spine was performed following the standard protocol without intravenous contrast. Multiplanar CT image reconstructions of the cervical spine were also generated. RADIATION DOSE REDUCTION: This exam was performed according to the departmental dose-optimization program which includes automated exposure control, adjustment of the mA and/or kV according to patient size and/or use of iterative reconstruction technique. COMPARISON:  CT head and cervical spine 08/19/2022. FINDINGS: CT HEAD FINDINGS Brain: No evidence of acute infarction, hemorrhage, hydrocephalus, extra-axial collection or mass lesion/mass effect. Vascular: No hyperdense vessel. Skull: No acute fracture. Sinuses/Orbits: Opacified right posterior ethmoid air cell. Otherwise, clear sinuses. Other: No mastoid effusions. CT CERVICAL SPINE FINDINGS Alignment: Anterolisthesis of C4 on C5, favored degenerative given facet arthropathy at this level and similar alignment on the prior. Skull base and  vertebrae: Mild height loss of an upper thoracic vertebral bodies unchanged L2 to the prior. No evidence of new vertebral body height loss or acute fracture. Osteopenia. Soft tissues and spinal canal: No prevertebral fluid or swelling. No visible canal hematoma. Disc levels: Similar multilevel degenerative change including facet uncovertebral hypertrophy 3 degrees of neural foraminal stenosis, greatest on the left C5-C6. Upper chest: Visualized lung apices are clear. Large left thyroid nodule further characterized on prior ultrasound and biopsy from October 26, 2022. IMPRESSION: No acute findings intracranially or in the cervical spine. Electronically Signed   By: Margaretha Sheffield M.D.   On: 11/13/2022 10:54    Pertinent labs & imaging results that were available during my care of the patient were reviewed by me and considered in my medical decision making (see MDM for details).  Medications Ordered in ED Medications  lidocaine (PF) (XYLOCAINE) 1 % injection 5 mL (has no administration in time range)  Tdap (BOOSTRIX) injection 0.5 mL (0.5 mLs Intramuscular Given 11/13/22 1128)                                                                                                                                     Procedures .Marland KitchenLaceration Repair  Date/Time: 11/13/2022 1:37 PM  Performed by: Cristie Hem, MD Authorized by: Cristie Hem, MD   Consent:    Consent obtained:  Verbal   Consent given by:  Patient   Risks, benefits, and alternatives were discussed: yes     Risks discussed:  Infection, need for additional repair, nerve damage, retained foreign body, poor cosmetic result, poor wound healing and pain   Alternatives discussed:  No treatment Universal protocol:    Procedure explained and questions answered to patient or proxy's satisfaction: yes     Patient identity confirmed:  Arm band and  verbally with patient Anesthesia:    Anesthesia method:  Local infiltration   Local  anesthetic:  Lidocaine 1% w/o epi Laceration details:    Location:  Shoulder/arm   Shoulder/arm location:  L upper arm   Length (cm):  1 Pre-procedure details:    Preparation:  Imaging obtained to evaluate for foreign bodies Exploration:    Imaging obtained: x-ray     Imaging outcome: foreign body not noted     Wound exploration: wound explored through full range of motion and entire depth of wound visualized     Wound extent: areolar tissue not violated, fascia not violated, no foreign body, no signs of injury, no nerve damage, no tendon damage, no underlying fracture and no vascular damage     Contaminated: no   Treatment:    Area cleansed with:  Saline   Amount of cleaning:  Standard   Irrigation solution:  Sterile saline   Visualized foreign bodies/material removed: no     Debridement:  None   Undermining:  None   Scar revision: no   Skin repair:    Repair method:  Sutures   Suture size:  4-0   Suture material:  Prolene   Suture technique:  Simple interrupted   Number of sutures:  2 Approximation:    Approximation:  Close Repair type:    Repair type:  Simple Post-procedure details:    Procedure completion:  Tolerated well, no immediate complications   (including critical care time)  Medical Decision Making / ED Course   MDM:  82 year old female presenting to the emergency department after mechanical fall.  Patient remembers accident, oriented, at baseline.  Exam notable for skin tear to left elbow and hematoma to the left posterior scalp, able to range elbow without pain.  Remainder of trauma exam without evidence of significant trauma.  Will obtain CT head neck to evaluate for intracranial or cervical spine injury.  Will x-ray left elbow although low concern for fracture. Will update TDAP appears last was 2015. If workup unremarkable will ambulation trial and discharge back to assisted living facility.   Clinical Course as of 11/13/22 1338  Mon Nov 13, 2022   1102 CT head and neck negative.  [WS]  1330 Reexamined wound after wound cleaned.  Approximately 1 cm laceration.  Repaired with sutures.  See procedure note. Will discharge patient to home. All questions answered. Patient comfortable with plan of discharge. Return precautions discussed with patient and specified on the after visit summary.  [WS]    Clinical Course User Index [WS] Cristie Hem, MD     Additional history obtained: -Additional history obtained from ems -External records from outside source obtained and reviewed including: Chart review including previous notes, labs, imaging, consultation notes including PMD note 10/30/22   Imaging Studies ordered: I ordered imaging studies including CT head, neck, XR left elbow On my interpretation imaging demonstrates no acute process I independently visualized and interpreted imaging. I agree with the radiologist interpretation   Medicines ordered and prescription drug management: Meds ordered this encounter  Medications   Tdap (BOOSTRIX) injection 0.5 mL   lidocaine (PF) (XYLOCAINE) 1 % injection 5 mL    -I have reviewed the patients home medicines and have made adjustments as needed   Social Determinants of Health:  Diagnosis or treatment significantly limited by social determinants of health: former smoker   Reevaluation: After the interventions noted above, I reevaluated the patient and found that they have improved  Co morbidities that complicate  the patient evaluation  Past Medical History:  Diagnosis Date   Acute renal failure superimposed on stage 3 chronic kidney disease ([place]) 01/31/2020   Breast cancer ([place]) 2012   Cancer of central portion of right female breast ([place]) 09/04/2010   Patient has a long history of fibrocystic disease. 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 She underwent right total mastectomy with sentinel node biopsy and left total (prophylactic) mastectomy on 10/12/2010. She underwent  immediate reconstruction with expander and AlloDerm placement. 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 One lymph   Complicated UTI (urinary tract infection) 01/31/2020   Diabetes mellitus    Dizziness 02/24/2019   Elevated LFTs 08/10/2014   Episodic lightheadedness 08/27/2019   Esophageal reflux 05/06/2008   Essential hypertension 02/16/2009   Family history of breast cancer    Family history of ovarian cancer    Fatigue 02/24/2019   Fatty liver 08/05/2015   Genetic testing 09/04/2017   Negative genetic testing on the common hereditary cancer panel.  The Hereditary Gene Panel offered by [facility] includes sequencing and/or deletion duplication testing of the following 47 genes: APC, ATM, AXIN2, BARD1, BMPR1A, BRCA1, BRCA2, BRIP1, CDH1, CDK4, CDKN2A (p14ARF), CDKN2A (p16INK4a), CHEK2, CTNNA1, DICER1, EPCAM (Deletion/duplication testing only), GREM1 (promoter region deletion/duplicat   GERD (gastroesophageal reflux disease)    [name] syndrome    GILBERT'S SYNDROME 02/06/2007   Qualifier: Diagnosis of  By: Janelle Floor     History of breast cancer 08/04/2015   S/p b/l mastectomy   History of colonic polyps 06/06/2010   Annotation: destroyed, no clear adenomatous proven Qualifier: Diagnosis of  By: Carlean Purl MD, Tonna Boehringer E  2008 polyp 2013 neg     Hypertension    Hypertriglyceridemia 09/30/2007   statin intolerant  Father MI @ 109 Sister CVA > 65    Hypokalemia due to inadequate potassium intake 01/31/2020   IBS (irritable bowel syndrome)    Joint pain 09/02/2010   Lactic acidosis 01/31/2020   Osteoarthritis of left knee 08/04/2015   Significant arthritis in knees, limits her activity    Osteopenia 08/31/2013   [facility]  DEXA 03/20/2018: Osteopenia:  LFN -1.3, R--1.0, spine -0.6-statistically significant increase in BMD in all areas  Findings 08/22/13 : lowest T score -  1.3  @  R femoral neck (hip) ,3.4  %loss @ R femur &   4.1 % in spine    since  2012 Dexa 01/05/16: lowest  - L femur neck T -1.6 Diagnosis: mild Osteopenia Rx: Fosamax remotely; now on Arimidex    Parkinsonian features 01/31/2020   Peripheral neuropathy 08/23/2018   Plantar fasciitis of right foot    Dr Oneta Rack   Poor balance 02/20/2018   S/P bilateral mastectomy 08/04/2015   Septic shock ([place]) 01/31/2020   Severe sepsis with acute organ dysfunction due to gram-negative bacteria ([place]) 01/31/2020   Transfusion history 1976   Type 2 diabetes mellitus with stage 3 chronic kidney disease, without long-term current use of insulin ([place]) 08/11/2008   Ophth, Dr Kathrin Penner: no retinopathy  Diabetes maternal grandmother      Dispostion: Disposition decision including need for hospitalization was considered, and patient discharged from emergency department.    Final Clinical Impression(s) / ED Diagnoses Final diagnoses:  Elbow laceration, left, initial encounter  Contusion of scalp, initial encounter     This chart was dictated using voice recognition software.  Despite best efforts to proofread,  errors can occur which can change the documentation  meaning.    Cristie Hem, MD 11/13/22 [phone]

## 2022-11-15 ENCOUNTER — Ambulatory Visit: Payer: Self-pay | Admitting: Licensed Clinical Social Worker

## 2022-11-15 DIAGNOSIS — F0283 Dementia in other diseases classified elsewhere, unspecified severity, with mood disturbance: Secondary | ICD-10-CM | POA: Diagnosis not present

## 2022-11-15 DIAGNOSIS — E1142 Type 2 diabetes mellitus with diabetic polyneuropathy: Secondary | ICD-10-CM | POA: Diagnosis not present

## 2022-11-15 DIAGNOSIS — G20A1 Parkinson's disease without dyskinesia, without mention of fluctuations: Secondary | ICD-10-CM | POA: Diagnosis not present

## 2022-11-15 DIAGNOSIS — F32A Depression, unspecified: Secondary | ICD-10-CM | POA: Diagnosis not present

## 2022-11-15 DIAGNOSIS — I480 Paroxysmal atrial fibrillation: Secondary | ICD-10-CM | POA: Diagnosis not present

## 2022-11-15 NOTE — Patient Outreach (Signed)
  Care Coordination   Follow Up Visit Note   11/15/2022 Name: CAMBRIA UMEDA MRN: [id number] DOB: Dec 24, 1940  GERI URSIN is a 82 y.o. year old female who sees Burns, Claudina Lick, MD for primary care. I spoke with [name], spouse of client, via phone today about client needs. .  What matters to the patients health and wellness today?  Patient now resides at ALF, [facility], in [place], [place]. Spouse of client has asked about transport resources list to be mailed to him    Goals Addressed             This Visit's Progress    Patient is planning to move to ALF,  [facility] in [place], [place]. Spouse of patient asked about transport resources list to be mailed to him       Interventions LCSW talked today via phone with [name], spouse of client. [name] informed LCSW that client now resides at [facility] in [place], [place]. Client is fall risk. [name] informed LCSW that client fell recently and had to go to ED for care. Cleint is now at [facility]. Client uses walker to help with walking.  [name] informed LCSW that Dr. [name] PCP has ordered HHPT and a manual wheelchair for client  Discussed client adjustment to facility. [name] feels that client is adjusting well to care received by client at ALF Discussed appetite of client. Discussed sleeping issues of client. LCSW encouraged Katerin Repinski to call LCSW as needed for SW support for client at [phone] [name] appreciative of call today from LCSW      SDOH assessments and interventions completed:  Yes  SDOH Interventions Today    Flowsheet Row Most Recent Value  SDOH Interventions   Depression Interventions/Treatment  Medication  Stress Interventions Other (Comment)  [client has stress related to managing medical needs]        Care Coordination Interventions:  Yes, provided   Interventions Today    Flowsheet Row Most Recent Value  Chronic Disease   Chronic disease  during today's visit Other  [discussed client recent fall with [name], spouse of client]  General Interventions   General Interventions Discussed/Reviewed General Interventions Discussed, [employer] program support services. Discussed ALF care client is receiving currently]  Exercise Interventions   Exercise Discussed/Reviewed Physical Activity  [client is receiving physical therapy sessions as scheduled at ALF]  Education Interventions   Education Provided Provided Education  Provided Verbal Education On Community Resources        Follow up plan: Follow up call scheduled for 12/19/22 at 11:00 AM between LCSW and [name], spouse of client    Encounter Outcome:  Pt. Visit Completed   Norva Riffle.Bellany Elbaum MSW, [place] Holiday representative [facility] Care Management [phone]

## 2022-11-15 NOTE — Patient Instructions (Signed)
Visit Information  Thank you for taking time to visit with me today. Please don't hesitate to contact me if I can be of assistance to you before our next scheduled telephone appointment.  Following are the goals we discussed today:    Our next appointment is by telephone on 12/19/2022 at 11:00 AM   Please call the care guide team at [phone] if you need to cancel or reschedule your appointment.   If you are experiencing a Mental Health or [place] or need someone to talk to, please go to [facility] [place] [phone])   Following is a copy of your full plan of care:   Interventions LCSW talked today via phone with [name], spouse of client. [name] informed LCSW that client now resides at [facility] in [place], [place]. Client is fall risk. [name] informed LCSW that client fell recently and had to go to ED for care. Client  is now at [facility]. Client uses walker to help with walking.  [name] informed LCSW that Dr. [name] PCP has ordered HHPT and a manual wheelchair for client  Discussed client adjustment to facility. [name] feels that client is adjusting well to care received by client at ALF Discussed appetite of client. Discussed sleeping issues of client. LCSW encouraged Taci Debell to call LCSW as needed for SW support for client at [phone] [name] appreciative of call today from LCSW   Ms. Hutzel was given information about Care Management services by the embedded care coordination team including:  Care Management services include personalized support from designated clinical staff supervised by her physician, including individualized plan of care and coordination with other care providers 24/7 contact phone numbers for assistance for urgent and routine care needs. The patient may stop CCM services at any time (effective at the end of the month) by phone call to the  office staff.  Patient agreed to services and verbal consent obtained.   Norva Riffle.Kendel Pesnell MSW, [place] Holiday representative [facility] Care Management [phone]

## 2022-11-17 ENCOUNTER — Encounter (HOSPITAL_COMMUNITY): Payer: Self-pay

## 2022-11-20 ENCOUNTER — Telehealth: Payer: Self-pay

## 2022-11-20 NOTE — Telephone Encounter (Signed)
     Patient  visit on 2/26  at [facility] you been able to follow up with your primary care physician? Yes ALF  The patient was or was not able to obtain any needed medicine or equipment. Yes   Are there diet recommendations that you are having difficulty following? Na    Patient expresses understanding of discharge instructions and education provided has no other needs at this time.  Yes      [facility] [phone] [address], [place], [place] [address] Phone: [phone] Email: [email or web address]

## 2022-11-22 DIAGNOSIS — I10 Essential (primary) hypertension: Secondary | ICD-10-CM | POA: Diagnosis not present

## 2022-11-22 DIAGNOSIS — E1142 Type 2 diabetes mellitus with diabetic polyneuropathy: Secondary | ICD-10-CM | POA: Diagnosis not present

## 2022-11-22 DIAGNOSIS — G20A1 Parkinson's disease without dyskinesia, without mention of fluctuations: Secondary | ICD-10-CM | POA: Diagnosis not present

## 2022-11-22 DIAGNOSIS — F0283 Dementia in other diseases classified elsewhere, unspecified severity, with mood disturbance: Secondary | ICD-10-CM | POA: Diagnosis not present

## 2022-11-22 DIAGNOSIS — F32A Depression, unspecified: Secondary | ICD-10-CM | POA: Diagnosis not present

## 2022-11-23 ENCOUNTER — Telehealth: Payer: Self-pay | Admitting: Internal Medicine

## 2022-11-23 NOTE — Telephone Encounter (Signed)
Doug with [facility] called needing verbals for continued home health physical therapy 1 week 3 and patient had a fall a week and a half ago and has 2 sutures in elbow, believes that were put in on 2/26, and would like verbals to remove in 10-14 days after suture date. Best callback is [phone].

## 2022-11-23 NOTE — Telephone Encounter (Signed)
Verbals given today. °

## 2022-11-23 NOTE — Telephone Encounter (Signed)
Okay for orders? 

## 2022-11-24 ENCOUNTER — Other Ambulatory Visit: Payer: Self-pay

## 2022-11-24 ENCOUNTER — Emergency Department (HOSPITAL_COMMUNITY): Payer: Medicare Other

## 2022-11-24 ENCOUNTER — Emergency Department (HOSPITAL_COMMUNITY)
Admission: EM | Admit: 2022-11-24 | Discharge: 2022-11-24 | Disposition: A | Discharge status: Skilled Nursing Facility | Payer: Medicare Other | Attending: Emergency Medicine | Admitting: Emergency Medicine

## 2022-11-24 DIAGNOSIS — W19XXXA Unspecified fall, initial encounter: Secondary | ICD-10-CM | POA: Diagnosis not present

## 2022-11-24 DIAGNOSIS — S51011A Laceration without foreign body of right elbow, initial encounter: Secondary | ICD-10-CM | POA: Diagnosis not present

## 2022-11-24 DIAGNOSIS — I10 Essential (primary) hypertension: Secondary | ICD-10-CM | POA: Diagnosis not present

## 2022-11-24 DIAGNOSIS — S199XXA Unspecified injury of neck, initial encounter: Secondary | ICD-10-CM | POA: Diagnosis not present

## 2022-11-24 DIAGNOSIS — W01198A Fall on same level from slipping, tripping and stumbling with subsequent striking against other object, initial encounter: Secondary | ICD-10-CM | POA: Insufficient documentation

## 2022-11-24 DIAGNOSIS — Z743 Need for continuous supervision: Secondary | ICD-10-CM | POA: Diagnosis not present

## 2022-11-24 DIAGNOSIS — S0003XA Contusion of scalp, initial encounter: Secondary | ICD-10-CM | POA: Diagnosis not present

## 2022-11-24 DIAGNOSIS — Z7982 Long term (current) use of aspirin: Secondary | ICD-10-CM | POA: Insufficient documentation

## 2022-11-24 DIAGNOSIS — S0990XA Unspecified injury of head, initial encounter: Secondary | ICD-10-CM | POA: Diagnosis not present

## 2022-11-24 DIAGNOSIS — R404 Transient alteration of awareness: Secondary | ICD-10-CM | POA: Diagnosis not present

## 2022-11-24 DIAGNOSIS — Y92129 Unspecified place in nursing home as the place of occurrence of the external cause: Secondary | ICD-10-CM | POA: Insufficient documentation

## 2022-11-24 DIAGNOSIS — M7989 Other specified soft tissue disorders: Secondary | ICD-10-CM | POA: Diagnosis not present

## 2022-11-24 DIAGNOSIS — Z79899 Other long term (current) drug therapy: Secondary | ICD-10-CM | POA: Insufficient documentation

## 2022-11-24 DIAGNOSIS — G4489 Other headache syndrome: Secondary | ICD-10-CM | POA: Diagnosis not present

## 2022-11-24 MED ORDER — LIDOCAINE HCL (PF) 1 % IJ SOLN
5.0000 mL | Freq: Once | INTRAMUSCULAR | Status: AC
  Administered 2022-11-24: 5 mL
  Filled 2022-11-24: qty 30

## 2022-11-24 NOTE — ED Notes (Signed)
Patient given discharge instructions and follow up care. Patient/family verbalized understanding. Patient taken out of ED via wheelchair.

## 2022-11-24 NOTE — ED Triage Notes (Signed)
BIB EMS from [employer] care.  Had mechanical fall today while using her walker.  Got up and was not having pain but is now having headache.  Skin tear to right elbow.  Hematoma to back of head. Does not take blood thinners.  Had another fall on 11/13/22 as well. No LOC.  Has been nauseated.

## 2022-11-24 NOTE — ED Notes (Signed)
Patient taken to bathroom via wheelchair. Patient tolerated well. Patient taken back to bed and placed back on monitor.
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
## 2022-11-24 NOTE — ED Notes (Signed)
Husband's phn# is [phone] and he can pick her up and take back to the facility when she is discharged

## 2022-11-24 NOTE — Discharge Instructions (Signed)
You were seen in the emergency department due to fall.  Your imaging studies were reassuring, nothing is broken and no internal bleeding.  Have the sutures removed in about 10 days by your primary.  Turn to the ED for new or concerning symptoms.

## 2022-11-28 DIAGNOSIS — I10 Essential (primary) hypertension: Secondary | ICD-10-CM | POA: Diagnosis not present

## 2022-11-28 DIAGNOSIS — F0283 Dementia in other diseases classified elsewhere, unspecified severity, with mood disturbance: Secondary | ICD-10-CM | POA: Diagnosis not present

## 2022-11-28 DIAGNOSIS — F32A Depression, unspecified: Secondary | ICD-10-CM | POA: Diagnosis not present

## 2022-11-28 DIAGNOSIS — G20A1 Parkinson's disease without dyskinesia, without mention of fluctuations: Secondary | ICD-10-CM | POA: Diagnosis not present

## 2022-11-28 DIAGNOSIS — E1142 Type 2 diabetes mellitus with diabetic polyneuropathy: Secondary | ICD-10-CM | POA: Diagnosis not present

## 2022-11-29 ENCOUNTER — Telehealth: Payer: Self-pay

## 2022-11-29 NOTE — Telephone Encounter (Signed)
        Patient  visited [facility] on 3/8     Telephone encounter attempt :  1st  A HIPAA compliant voice message was left requesting a return call.  Instructed patient to call back .    Winnie [facility] Care Guide, [place] [phone] [address], [place], [place] [address] Phone: [phone] Email: [email or web address]       

## 2022-11-30 ENCOUNTER — Telehealth: Payer: Self-pay

## 2022-11-30 NOTE — Telephone Encounter (Signed)
     Patient  visit on 3/8  at [facility] you been able to follow up with your primary care physician? Yes   The patient was or was not able to obtain any needed medicine or equipment. Yes   Are there diet recommendations that you are having difficulty following? Yes   Patient expresses understanding of discharge instructions and education provided has no other needs at this time.  Yes      [facility] [phone] [address], [place], [place] [address] Phone: [phone] Email: [email or web address]

## 2022-12-01 ENCOUNTER — Telehealth: Payer: Self-pay | Admitting: Internal Medicine

## 2022-12-01 NOTE — Telephone Encounter (Signed)
Sonal from [facility] called for verbal orders to re-certify the pt for speech therapy with a frequency of:  1X for 4 week  Please call Sonal to confirm: [phone]

## 2022-12-01 NOTE — Telephone Encounter (Signed)
Okay for orders? 

## 2022-12-04 NOTE — Telephone Encounter (Signed)
Verbals given today. °

## 2022-12-07 DIAGNOSIS — F32A Depression, unspecified: Secondary | ICD-10-CM | POA: Diagnosis not present

## 2022-12-07 DIAGNOSIS — F0283 Dementia in other diseases classified elsewhere, unspecified severity, with mood disturbance: Secondary | ICD-10-CM | POA: Diagnosis not present

## 2022-12-07 DIAGNOSIS — E1142 Type 2 diabetes mellitus with diabetic polyneuropathy: Secondary | ICD-10-CM | POA: Diagnosis not present

## 2022-12-07 DIAGNOSIS — I10 Essential (primary) hypertension: Secondary | ICD-10-CM | POA: Diagnosis not present

## 2022-12-07 DIAGNOSIS — G20A1 Parkinson's disease without dyskinesia, without mention of fluctuations: Secondary | ICD-10-CM | POA: Diagnosis not present

## 2022-12-08 DIAGNOSIS — I10 Essential (primary) hypertension: Secondary | ICD-10-CM | POA: Diagnosis not present

## 2022-12-08 DIAGNOSIS — F32A Depression, unspecified: Secondary | ICD-10-CM | POA: Diagnosis not present

## 2022-12-08 DIAGNOSIS — F0283 Dementia in other diseases classified elsewhere, unspecified severity, with mood disturbance: Secondary | ICD-10-CM | POA: Diagnosis not present

## 2022-12-08 DIAGNOSIS — G20A1 Parkinson's disease without dyskinesia, without mention of fluctuations: Secondary | ICD-10-CM | POA: Diagnosis not present

## 2022-12-08 DIAGNOSIS — E1142 Type 2 diabetes mellitus with diabetic polyneuropathy: Secondary | ICD-10-CM | POA: Diagnosis not present

## 2022-12-11 ENCOUNTER — Ambulatory Visit: Payer: Self-pay | Admitting: Licensed Clinical Social Worker

## 2022-12-11 NOTE — Patient Instructions (Signed)
Visit Information  Thank you for taking time to visit with me today. Please don't hesitate to contact me if I can be of assistance to you.   Following are the goals we discussed today:   Goals Addressed             This Visit's Progress    Patient is now residing at ALF. She is getting daily help with ADLs, medications, and meals. She is receiving physical therapy support       Interventions:  LCSW spoke via phone today with [name], spouse of client, about client needs Client recently moved to [facility] in [place], [place]. [name] said client was adjusting slowly to facility Reviewed ambulation of client. Client had recent fall and had to go to ED for treatment. She is back now at ALF. She is receiving physical therapy support and uses a rollator walker to help her walk LCSW talked with [name] about transport needs of client. He said he transports client to and from client medical appointments Discussed program support with [name],  Encouraged [name] to call LCSW to discuss SW needs of client at [phone]        Our next appointment is by telephone on 01/22/23 at 3:00 PM   Please call the care guide team at [phone] if you need to cancel or reschedule your appointment.   If you are experiencing a Mental Health or [place] or need someone to talk to, please go to [facility] [address], [place] [phone])   The patient / [name], verbalized understanding of instructions, educational materials, and care plan provided today and DECLINED offer to receive copy of patient instructions, educational materials, and care plan.   The patient / [name], has been provided with contact information for the care management team and has been advised to call with any health related questions or concerns.   Norva Riffle.Larone Kliethermes MSW, [place] Holiday representative [facility]  Care Management [phone]

## 2022-12-11 NOTE — Patient Outreach (Signed)
  Care Coordination   Follow Up Visit Note   12/11/2022 Name: [name] MRN: [id number] DOB: [birth year]  [name] is a 82 y.o. year old female who sees Burns, Claudina Lick, MD for primary care. I spoke with  [name] / [name], spouse of client, by phone today.  What matters to the patients health and wellness today? Client needs help with daily ADLs and with walking. She resides at ALF and is getting physical therapy support    Goals Addressed             This Visit's Progress    Patient is now residing at ALF. She is getting daily help with ADLs, medications, and meals. She is receiving physical therapy support       Interventions:  LCSW spoke via phone today with [name], spouse of client, about client needs Client recently moved to [facility] in [place], [place]. [name] said client was adjusting slowly to facility Reviewed ambulation of client. Client had recent fall and had to go to ED for treatment. She is back now at ALF. She is receiving physical therapy support and uses a rollator walker to help her walk LCSW talked with [name] about transport needs of client. He said he transports client to and from client medical appointments Discussed program support with [name],  Encouraged [name] to call LCSW to discuss SW needs of client at [phone]        SDOH assessments and interventions completed:  Yes  SDOH Interventions Today    Flowsheet Row Most Recent Value  SDOH Interventions   Physical Activity Interventions Other (Comments)  [client uses walker to help her ambulate]  Stress Interventions Other (Comment)  [client has stress related to managing medical needs]        Care Coordination Interventions:  Yes, provided    Interventions Today    Flowsheet Row Most Recent Value  Chronic Disease   Chronic disease during today's visit Other  [spoke via phone with [name], spouse of  client, about client needs]  General Interventions   General Interventions Discussed/Reviewed General Interventions Discussed, Community Resources  [discussed program support]  Exercise Interventions   Exercise Discussed/Reviewed Physical Activity  [client had recent fall. she uses rollator walker to help her ambulate]  Physical Activity Discussed/Reviewed Physical Activity Discussed  Education Interventions   Education Provided Provided Education  Provided Verbal Education On [employer]  [discussed transport needs of client]  Mental Health Interventions   Mental Health Discussed/Reviewed Coping Strategies  [client is adjusting well to being at ALF]  Safety Interventions   Safety Discussed/Reviewed Fall Risk       Follow up plan: Follow up call scheduled for 01/22/23 at 3:00 PM     Encounter Outcome:  Pt. Visit Completed   Norva Riffle.Mohab Ashby MSW, [place] Holiday representative [facility] Care Management [phone]

## 2022-12-12 DIAGNOSIS — G20A1 Parkinson's disease without dyskinesia, without mention of fluctuations: Secondary | ICD-10-CM | POA: Diagnosis not present

## 2022-12-15 DIAGNOSIS — E1142 Type 2 diabetes mellitus with diabetic polyneuropathy: Secondary | ICD-10-CM | POA: Diagnosis not present

## 2022-12-15 DIAGNOSIS — F0283 Dementia in other diseases classified elsewhere, unspecified severity, with mood disturbance: Secondary | ICD-10-CM | POA: Diagnosis not present

## 2022-12-15 DIAGNOSIS — I10 Essential (primary) hypertension: Secondary | ICD-10-CM | POA: Diagnosis not present

## 2022-12-15 DIAGNOSIS — F32A Depression, unspecified: Secondary | ICD-10-CM | POA: Diagnosis not present

## 2022-12-15 DIAGNOSIS — G20A1 Parkinson's disease without dyskinesia, without mention of fluctuations: Secondary | ICD-10-CM | POA: Diagnosis not present

## 2022-12-19 ENCOUNTER — Encounter: Payer: Medicare Other | Admitting: Licensed Clinical Social Worker

## 2022-12-19 DIAGNOSIS — E1142 Type 2 diabetes mellitus with diabetic polyneuropathy: Secondary | ICD-10-CM | POA: Diagnosis not present

## 2022-12-19 DIAGNOSIS — F0283 Dementia in other diseases classified elsewhere, unspecified severity, with mood disturbance: Secondary | ICD-10-CM | POA: Diagnosis not present

## 2022-12-19 DIAGNOSIS — G20A1 Parkinson's disease without dyskinesia, without mention of fluctuations: Secondary | ICD-10-CM | POA: Diagnosis not present

## 2022-12-19 DIAGNOSIS — I10 Essential (primary) hypertension: Secondary | ICD-10-CM | POA: Diagnosis not present

## 2022-12-19 DIAGNOSIS — F32A Depression, unspecified: Secondary | ICD-10-CM | POA: Diagnosis not present

## 2022-12-19 NOTE — Progress Notes (Unsigned)
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  She has not responded to levodopa.  She has reduced upward gaze, endorses mild difficulty swallowing, upright posture and tends to fall backward.   Depression   Will increase escitalopram to 10mg  daily.  Carbidopa-levodopa 25/100mg  1.5 tablets four times daly (8AM, 12PM, 4PM, 10PM)  PT and speech therapy Follow up 6 months.   Subjective:  [name] is a 82 year old female with diabetes, hypertension, Gilbert's syndrome, and irritable bowel syndrome who follows up for Parkinsonism  She is accompanied by her husband who supplements history.   UPDATE: Current medication:  carbidopa-levodopa 25/100mg  1.5 tablets four times daily (8AM, 2 PM, 4PM, 10PM); escitalopram 5mg  daily   Since 1/15, she has been residing at [facility].  She has had several falls, two requiring ED visits.  She is using her rollator but will get distracted or rushing and then lose her balance.  Had a fall on 3/8 in which she was using her walker and tripped falling backwards and hitting her head.  No loss of consciousness.  Evaluated in the ED.  CT head personally reviewed revealed posterior scalp hematoma but no acute intracranial abnormality.  CT C-spine revealed no acute fracture or subluxation.  She is getting PT and SPT once a week.  Progression in word-finding and getting words out.  Denies trouble swallowing or double vision.  Appetite is good.  Denies hallucinations.  Sleeps often.  Started escitalopram last visit for depression.  Mood is unchanged.   She finds that the carbidopa-levodopa is effective.       HISTORY: Since summer 2018, she has had trouble with her penmanship.  She states it is more difficult to write and that her handwriting is smaller.  She denies cramping.   In early 2019, she started having increased problems with balance.   She needs to push off in order to stand.  She feels weak in the legs.  She denies back pain or radicular pain and numbness in the feet.  She denies neck pain.  She denies freezing when initiating taking a step.  She denies dizziness or lightheadedness.  She was started on Sinemet in late 2019 and she noted some improvement in movement.  Due to presence of Babinski, she had an MRI of brain on 10/19/2018 which showed no acute abnormality, and MRI of cervical spine on 11/04/2018 which showed arthritic changes but no significant stenosis causing myelopathy   No history of REM sleep behavior disorder.  There is no family history of PD.  Past medications:  citalopram (nightmares)  PAST MEDICAL HISTORY: Past Medical History:  Diagnosis Date   Acute renal failure superimposed on stage 3 chronic kidney disease ([place]) 01/31/2020   Breast cancer ([place]) 2012   Cancer of central portion of right female breast ([place]) 09/04/2010   Patient has a long history of fibrocystic disease. 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 She underwent right total mastectomy with sentinel node biopsy and left total (prophylactic) mastectomy on 10/12/2010. She underwent immediate reconstruction with expander and AlloDerm placement. 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 One lymph   Complicated UTI (urinary tract infection) 01/31/2020   Diabetes mellitus    Dizziness 02/24/2019   Elevated LFTs 08/10/2014   Episodic lightheadedness 08/27/2019   Esophageal reflux 05/06/2008   Essential hypertension 02/16/2009   Family history  of breast cancer    Family history of ovarian cancer    Fatigue 02/24/2019   Fatty liver 08/05/2015   Genetic testing 09/04/2017   Negative genetic testing on the common hereditary cancer panel.  The Hereditary Gene Panel offered by [facility] includes sequencing and/or deletion duplication testing of the following 47 genes: APC, ATM, AXIN2, BARD1, BMPR1A,  BRCA1, BRCA2, BRIP1, CDH1, CDK4, CDKN2A (p14ARF), CDKN2A (p16INK4a), CHEK2, CTNNA1, DICER1, EPCAM (Deletion/duplication testing only), GREM1 (promoter region deletion/duplicat   GERD (gastroesophageal reflux disease)    [name] syndrome    GILBERT'S SYNDROME 02/06/2007   Qualifier: Diagnosis of  By: Janelle Floor     History of breast cancer 08/04/2015   S/p b/l mastectomy   History of colonic polyps 06/06/2010   Annotation: destroyed, no clear adenomatous proven Qualifier: Diagnosis of  By: Carlean Purl MD, Tonna Boehringer E  2008 polyp 2013 neg     Hypertension    Hypertriglyceridemia 09/30/2007   statin intolerant  Father MI @ 39 Sister CVA > 65    Hypokalemia due to inadequate potassium intake 01/31/2020   IBS (irritable bowel syndrome)    Joint pain 09/02/2010   Lactic acidosis 01/31/2020   Osteoarthritis of left knee 08/04/2015   Significant arthritis in knees, limits her activity    Osteopenia 08/31/2013   [facility]  DEXA 03/20/2018: Osteopenia:  LFN -1.3, R--1.0, spine -0.6-statistically significant increase in BMD in all areas  Findings 08/22/13 : lowest T score -  1.3  @  R femoral neck (hip) ,3.4  %loss @ R femur &   4.1 % in spine   since  2012 Dexa 01/05/16: lowest  - L femur neck T -1.6 Diagnosis: mild Osteopenia Rx: Fosamax remotely; now on Arimidex    Parkinsonian features 01/31/2020   Peripheral neuropathy 08/23/2018   Plantar fasciitis of right foot    Dr Oneta Rack   Poor balance 02/20/2018   S/P bilateral mastectomy 08/04/2015   Septic shock ([place]) 01/31/2020   Severe sepsis with acute organ dysfunction due to gram-negative bacteria ([place]) 01/31/2020   Transfusion history 1976   Type 2 diabetes mellitus with stage 3 chronic kidney disease, without long-term current use of insulin ([place]) 08/11/2008   Ophth, Dr Kathrin Penner: no retinopathy  Diabetes maternal grandmother    MEDICATIONS: Current Outpatient Medications on File Prior to Visit  Medication Sig Dispense Refill   amLODipine (NORVASC) 5  MG tablet Take 1 tablet (5 mg total) by mouth daily. 90 tablet 1   aspirin 81 MG chewable tablet Chew 1 tablet (81 mg total) by mouth daily. 30 tablet 0   carbidopa-levodopa (SINEMET IR) 25-100 MG tablet Take 1.5 tablets by mouth 4 (four) times daily. 180 tablet 5   carvedilol (COREG) 25 MG tablet Take 1 tablet (25 mg total) by mouth 2 (two) times daily with a meal. 180 tablet 1   Cyanocobalamin (VITAMIN B-12) 5000 MCG SUBL Place 5,000 mcg under the tongue daily.      metFORMIN (GLUCOPHAGE XR) 750 MG 24 hr tablet Take 2 tablets (1,500 mg total) by mouth daily with breakfast. 180 tablet 1   Multiple Vitamin (MULTIVITAMIN WITH MINERALS) TABS tablet Take 1 tablet by mouth daily.     Omega-3 Fatty Acids (FISH OIL CONCENTRATE) 1000 MG CAPS Take 1,000 mg by mouth 2 (two) times daily.     No current facility-administered medications on file prior to visit.    ALLERGIES: Allergies  Allergen Reactions   Elemental Sulfur     Flushed, funny feeling in  throat    Exemestane Nausea Only    Other reaction(s): Dizziness (intolerance)   Morphine And Related    Statins Other (See Comments)    Elevated LFTs   Ace Inhibitors     REACTION: COUGH   Codeine     REACTION: VOMITTING    FAMILY HISTORY: Family History  Problem Relation Age of Onset   Heart attack Father 20   Breast cancer Sister 54        bilateral    Heart failure Sister        PMH intensive chemotherapy   Hypertension Sister    Stroke Sister 9   Stroke Mother        TMI   Breast cancer Maternal Aunt 40   Diabetes Maternal Grandmother    Ovarian cancer Paternal Grandmother    Breast cancer Maternal Aunt 77   Colon cancer Neg Hx    Stomach cancer Neg Hx    Esophageal cancer Neg Hx    Pancreatic cancer Neg Hx    Liver disease Neg Hx       Objective:  Blood pressure 105/63, pulse 73, height 5\' 3"  (1.6 m), weight 143 lb 6.4 oz (65 kg). General: No acute distress.  Patient appears well-groomed.   Head:   Normocephalic/atraumatic Eyes:  Fundi examined but not visualized Neck: supple, no paraspinal tenderness, full range of motion Heart:  Regular rate and rhythm Neurological Exam: alert and oriented.  Speech fluent and not dysarthric, language intact.  Hypomimia.  Hypophonia.  Reduced upward gaze.  Otherwise, CN II-XII intact. Tone normal, muscle strength 5-/5 throughout.  Bradykinesia.  Reduced finger-thumb tapping speed and amplitude bilaterally.  No tremor.  Sensation to light touch intact.  Deep tendon reflexes 2+..  Ambulates with walker.  Upright posture and broad-based gait.  Cannot ambulate unassisted.     Metta Clines, DO  CC: Billey Gosling, MD

## 2022-12-20 ENCOUNTER — Encounter: Payer: Self-pay | Admitting: Neurology

## 2022-12-20 ENCOUNTER — Ambulatory Visit: Payer: Medicare Other | Admitting: Neurology

## 2022-12-20 VITALS — BP 105/63 | HR 73 | Ht 63.0 in | Wt 143.4 lb

## 2022-12-20 DIAGNOSIS — Z9181 History of falling: Secondary | ICD-10-CM | POA: Diagnosis not present

## 2022-12-20 DIAGNOSIS — G20A1 Parkinson's disease without dyskinesia, without mention of fluctuations: Secondary | ICD-10-CM | POA: Diagnosis not present

## 2022-12-20 DIAGNOSIS — Z7982 Long term (current) use of aspirin: Secondary | ICD-10-CM | POA: Diagnosis not present

## 2022-12-20 DIAGNOSIS — E1142 Type 2 diabetes mellitus with diabetic polyneuropathy: Secondary | ICD-10-CM | POA: Diagnosis not present

## 2022-12-20 DIAGNOSIS — F32A Depression, unspecified: Secondary | ICD-10-CM | POA: Diagnosis not present

## 2022-12-20 DIAGNOSIS — Z853 Personal history of malignant neoplasm of breast: Secondary | ICD-10-CM | POA: Diagnosis not present

## 2022-12-20 DIAGNOSIS — Z7984 Long term (current) use of oral hypoglycemic drugs: Secondary | ICD-10-CM | POA: Diagnosis not present

## 2022-12-20 DIAGNOSIS — G231 Progressive supranuclear ophthalmoplegia [Steele-Richardson-Olszewski]: Secondary | ICD-10-CM

## 2022-12-20 DIAGNOSIS — F0283 Dementia in other diseases classified elsewhere, unspecified severity, with mood disturbance: Secondary | ICD-10-CM | POA: Diagnosis not present

## 2022-12-20 DIAGNOSIS — I480 Paroxysmal atrial fibrillation: Secondary | ICD-10-CM | POA: Diagnosis not present

## 2022-12-20 DIAGNOSIS — I10 Essential (primary) hypertension: Secondary | ICD-10-CM | POA: Diagnosis not present

## 2022-12-20 MED ORDER — ESCITALOPRAM OXALATE 10 MG PO TABS: 10.0000 mg | ORAL_TABLET | Freq: Every day | ORAL | 1 refills | Status: DC

## 2022-12-20 MED ORDER — CARBIDOPA-LEVODOPA 25-100 MG PO TABS: 1.5000 | ORAL_TABLET | Freq: Four times a day (QID) | ORAL | 1 refills | Status: DC

## 2022-12-20 NOTE — Patient Instructions (Signed)
Continue carbidopa-levodopa 1.5 tablets four times daily (8 AM, 12 PM, 4 PM, 10 PM) Increase escitalopram to 10mg  daily Physical therapy Speech therapy Use rolling walker at all times.  Be vigilant when ambulating Follow up 6 months.

## 2022-12-25 ENCOUNTER — Other Ambulatory Visit (HOSPITAL_COMMUNITY): Payer: Self-pay | Admitting: Otolaryngology

## 2022-12-25 DIAGNOSIS — E079 Disorder of thyroid, unspecified: Secondary | ICD-10-CM

## 2022-12-28 DIAGNOSIS — E1142 Type 2 diabetes mellitus with diabetic polyneuropathy: Secondary | ICD-10-CM | POA: Diagnosis not present

## 2022-12-28 DIAGNOSIS — G20A1 Parkinson's disease without dyskinesia, without mention of fluctuations: Secondary | ICD-10-CM | POA: Diagnosis not present

## 2022-12-28 DIAGNOSIS — F32A Depression, unspecified: Secondary | ICD-10-CM | POA: Diagnosis not present

## 2022-12-28 DIAGNOSIS — I10 Essential (primary) hypertension: Secondary | ICD-10-CM | POA: Diagnosis not present

## 2022-12-28 DIAGNOSIS — F0283 Dementia in other diseases classified elsewhere, unspecified severity, with mood disturbance: Secondary | ICD-10-CM | POA: Diagnosis not present

## 2023-01-02 DIAGNOSIS — F32A Depression, unspecified: Secondary | ICD-10-CM | POA: Diagnosis not present

## 2023-01-02 DIAGNOSIS — F0283 Dementia in other diseases classified elsewhere, unspecified severity, with mood disturbance: Secondary | ICD-10-CM | POA: Diagnosis not present

## 2023-01-02 DIAGNOSIS — G20A1 Parkinson's disease without dyskinesia, without mention of fluctuations: Secondary | ICD-10-CM | POA: Diagnosis not present

## 2023-01-02 DIAGNOSIS — E1142 Type 2 diabetes mellitus with diabetic polyneuropathy: Secondary | ICD-10-CM | POA: Diagnosis not present

## 2023-01-02 DIAGNOSIS — I10 Essential (primary) hypertension: Secondary | ICD-10-CM | POA: Diagnosis not present

## 2023-01-03 DIAGNOSIS — E1142 Type 2 diabetes mellitus with diabetic polyneuropathy: Secondary | ICD-10-CM | POA: Diagnosis not present

## 2023-01-03 DIAGNOSIS — G20A1 Parkinson's disease without dyskinesia, without mention of fluctuations: Secondary | ICD-10-CM | POA: Diagnosis not present

## 2023-01-03 DIAGNOSIS — F0283 Dementia in other diseases classified elsewhere, unspecified severity, with mood disturbance: Secondary | ICD-10-CM | POA: Diagnosis not present

## 2023-01-03 DIAGNOSIS — F32A Depression, unspecified: Secondary | ICD-10-CM | POA: Diagnosis not present

## 2023-01-03 DIAGNOSIS — I10 Essential (primary) hypertension: Secondary | ICD-10-CM | POA: Diagnosis not present

## 2023-01-07 ENCOUNTER — Emergency Department (HOSPITAL_COMMUNITY): Payer: Medicare Other

## 2023-01-07 ENCOUNTER — Other Ambulatory Visit: Payer: Self-pay

## 2023-01-07 ENCOUNTER — Emergency Department (HOSPITAL_COMMUNITY)
Admission: EM | Admit: 2023-01-07 | Discharge: 2023-01-08 | Disposition: A | Discharge status: Skilled Nursing Facility | Payer: Medicare Other | Attending: Emergency Medicine | Admitting: Emergency Medicine

## 2023-01-07 ENCOUNTER — Encounter (HOSPITAL_COMMUNITY): Payer: Self-pay

## 2023-01-07 DIAGNOSIS — Z7982 Long term (current) use of aspirin: Secondary | ICD-10-CM | POA: Diagnosis not present

## 2023-01-07 DIAGNOSIS — Z7984 Long term (current) use of oral hypoglycemic drugs: Secondary | ICD-10-CM | POA: Diagnosis not present

## 2023-01-07 DIAGNOSIS — G20C Parkinsonism, unspecified: Secondary | ICD-10-CM | POA: Insufficient documentation

## 2023-01-07 DIAGNOSIS — W19XXXA Unspecified fall, initial encounter: Secondary | ICD-10-CM | POA: Insufficient documentation

## 2023-01-07 DIAGNOSIS — J209 Acute bronchitis, unspecified: Secondary | ICD-10-CM | POA: Diagnosis not present

## 2023-01-07 DIAGNOSIS — I10 Essential (primary) hypertension: Secondary | ICD-10-CM | POA: Insufficient documentation

## 2023-01-07 DIAGNOSIS — Z853 Personal history of malignant neoplasm of breast: Secondary | ICD-10-CM | POA: Insufficient documentation

## 2023-01-07 DIAGNOSIS — S0003XA Contusion of scalp, initial encounter: Secondary | ICD-10-CM | POA: Diagnosis not present

## 2023-01-07 DIAGNOSIS — R404 Transient alteration of awareness: Secondary | ICD-10-CM | POA: Diagnosis not present

## 2023-01-07 DIAGNOSIS — Z87891 Personal history of nicotine dependence: Secondary | ICD-10-CM | POA: Diagnosis not present

## 2023-01-07 DIAGNOSIS — Z79899 Other long term (current) drug therapy: Secondary | ICD-10-CM | POA: Insufficient documentation

## 2023-01-07 DIAGNOSIS — I447 Left bundle-branch block, unspecified: Secondary | ICD-10-CM | POA: Diagnosis not present

## 2023-01-07 DIAGNOSIS — R062 Wheezing: Secondary | ICD-10-CM | POA: Diagnosis not present

## 2023-01-07 DIAGNOSIS — R42 Dizziness and giddiness: Secondary | ICD-10-CM | POA: Diagnosis not present

## 2023-01-07 DIAGNOSIS — Z743 Need for continuous supervision: Secondary | ICD-10-CM | POA: Diagnosis not present

## 2023-01-07 DIAGNOSIS — R6889 Other general symptoms and signs: Secondary | ICD-10-CM | POA: Diagnosis not present

## 2023-01-07 DIAGNOSIS — N390 Urinary tract infection, site not specified: Secondary | ICD-10-CM

## 2023-01-07 DIAGNOSIS — J9801 Acute bronchospasm: Secondary | ICD-10-CM | POA: Diagnosis not present

## 2023-01-07 DIAGNOSIS — Y92129 Unspecified place in nursing home as the place of occurrence of the external cause: Secondary | ICD-10-CM | POA: Diagnosis not present

## 2023-01-07 DIAGNOSIS — E119 Type 2 diabetes mellitus without complications: Secondary | ICD-10-CM | POA: Diagnosis not present

## 2023-01-07 DIAGNOSIS — S0990XA Unspecified injury of head, initial encounter: Secondary | ICD-10-CM | POA: Diagnosis present

## 2023-01-07 LAB — CBC WITH DIFFERENTIAL/PLATELET
Abs Immature Granulocytes: 0.02 10*3/uL (ref 0.00–0.07)
Basophils Absolute: 0 10*3/uL (ref 0.0–0.1)
Basophils Relative: 0 %
Eosinophils Absolute: 0 10*3/uL (ref 0.0–0.5)
Eosinophils Relative: 1 %
HCT: 35.7 % — ABNORMAL LOW (ref 36.0–46.0)
Hemoglobin: 11.8 g/dL — ABNORMAL LOW (ref 12.0–15.0)
Immature Granulocytes: 0 %
Lymphocytes Relative: 15 %
Lymphs Abs: 0.7 10*3/uL (ref 0.7–4.0)
MCH: 30.2 pg (ref 26.0–34.0)
MCHC: 33.1 g/dL (ref 30.0–36.0)
MCV: 91.3 fL (ref 80.0–100.0)
Monocytes Absolute: 0.8 10*3/uL (ref 0.1–1.0)
Monocytes Relative: 16 %
Neutro Abs: 3.5 10*3/uL (ref 1.7–7.7)
Neutrophils Relative %: 68 %
Platelets: 132 10*3/uL — ABNORMAL LOW (ref 150–400)
RBC: 3.91 MIL/uL (ref 3.87–5.11)
RDW: 14.6 % (ref 11.5–15.5)
WBC: 5.1 10*3/uL (ref 4.0–10.5)
nRBC: 0 % (ref 0.0–0.2)

## 2023-01-07 MED ORDER — IPRATROPIUM-ALBUTEROL 0.5-2.5 (3) MG/3ML IN SOLN
3.0000 mL | RESPIRATORY_TRACT | Status: DC
  Administered 2023-01-07: 3 mL via RESPIRATORY_TRACT
  Filled 2023-01-07: qty 3

## 2023-01-07 NOTE — ED Triage Notes (Signed)
Pt BIB EMS for dizziness that started today. Pt is from [facility] facility. Pt is from the memory care unit. Pt a&ox4. Pt denies CP, SOB, or n/v.

## 2023-01-07 NOTE — ED Provider Notes (Signed)
[facility]-EMERGENCY DEPT Provider Note: Lowella Dell, MD, FACEP  CSN: [id number] MRN: [id number] ARRIVAL: 01/07/23 at 2211 ROOM: WA06/WA06   CHIEF COMPLAINT  Dizziness   HISTORY OF PRESENT ILLNESS  01/07/23 10:49 PM [name] is a 82 y.o. female with a history of Parkinson's disease who is currently living at a memory care facility.  She is here reportedly for dizziness that started earlier today.  She tells me that she had a dizzy spell by which she means lightheadedness.  She states she fell and hit the back of her head.  This was not reported by EMS.  She denies pain in her head or neck.  She is noted to be wheezing but states she was not aware of wheezing and she is not short of breath but she has had a cough for about 3 days.   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 She underwent right total mastectomy with sentinel node biopsy and left total (prophylactic) mastectomy on 10/12/2010. She underwent immediate reconstruction with expander and AlloDerm placement. 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 One lymph   Complicated UTI (urinary tract infection) 01/31/2020   Diabetes mellitus    Dizziness 02/24/2019   Elevated LFTs 08/10/2014   Episodic lightheadedness 08/27/2019   Esophageal reflux 05/06/2008   Essential hypertension 02/16/2009   Family history of breast cancer    Family history of ovarian cancer    Fatigue 02/24/2019   Fatty liver 08/05/2015   Genetic testing 09/04/2017   Negative genetic testing on the common hereditary cancer panel.  The Hereditary Gene Panel offered by [facility] includes sequencing and/or deletion duplication testing of the following 47 genes:  APC, ATM, AXIN2, BARD1, BMPR1A, BRCA1, BRCA2, BRIP1, CDH1, CDK4, CDKN2A (p14ARF), CDKN2A (p16INK4a), CHEK2, CTNNA1, DICER1, EPCAM (Deletion/duplication testing only), GREM1 (promoter region deletion/duplicat   GERD (gastroesophageal reflux disease)    Sullivan Lone syndrome    GILBERT'S SYNDROME 02/06/2007   Qualifier: Diagnosis of  By: Wendall Stade     History of breast cancer 08/04/2015   S/p b/l mastectomy   History of colonic polyps 06/06/2010   Annotation: destroyed, no clear adenomatous proven Qualifier: Diagnosis of  By: Leone Payor MD, Alfonse Ras E  2008 polyp 2013 neg     Hypertension    Hypertriglyceridemia 09/30/2007   statin intolerant  Father MI @ 64 Sister CVA > 65    Hypokalemia due to inadequate potassium intake 01/31/2020   IBS (irritable bowel syndrome)    Joint pain 09/02/2010   Lactic acidosis 01/31/2020   Osteoarthritis of left knee 08/04/2015   Significant arthritis in knees, limits her activity    Osteopenia 08/31/2013   [facility]  DEXA 03/20/2018: Osteopenia:  LFN -1.3, R--1.0, spine -0.6-statistically significant increase in BMD in all areas  Findings 08/22/13 : lowest T score -  1.3  @  R femoral neck (hip) ,3.4  %loss @ R femur &   4.1 % in spine   since  2012 Dexa 01/05/16: lowest  - L femur neck T -1.6 Diagnosis: mild Osteopenia Rx: Fosamax remotely; now on Arimidex    Parkinsonian features 01/31/2020   Peripheral neuropathy 08/23/2018   Plantar fasciitis of right foot  Dr Jaquita Rector   Poor balance 02/20/2018   S/P bilateral mastectomy 08/04/2015   Septic shock 01/31/2020   Severe sepsis with acute organ dysfunction due to gram-negative bacteria 01/31/2020   Transfusion history 1976   Type 2 diabetes mellitus with stage 3 chronic kidney disease, without long-term current use of insulin 08/11/2008   Ophth, Dr Dagoberto Ligas: no retinopathy  Diabetes maternal grandmother    Past Surgical History:  Procedure Laterality Date   ABDOMINAL HYSTERECTOMY  1976   with BSO due to infection  from [facility] IUD   APPENDECTOMY  1976   @ [facility] & BSO   BREAST LUMPECTOMY     [id number], [phone]   CATARACT EXTRACTION, BILATERAL Bilateral 2018   COLONOSCOPY W/ POLYPECTOMY  2006   negative 2011; Dr Leone Payor   MASTECTOMY W/ NODES PARTIAL  2012   double mastectomy with nodes taken out on right side    PLACEMENT OF BREAST IMPLANTS  04/2011   Dr Kelly Splinter,  [facility]   TONSILLECTOMY AND ADENOIDECTOMY      Family History  Problem Relation Age of Onset   Heart attack Father 74   Breast cancer Sister 9        bilateral    Heart failure Sister        PMH intensive chemotherapy   Hypertension Sister    Stroke Sister 7   Stroke Mother        TMI   Breast cancer Maternal Aunt 40   Diabetes Maternal Grandmother    Ovarian cancer Paternal Grandmother    Breast cancer Maternal Aunt 77   Colon cancer Neg Hx    Stomach cancer Neg Hx    Esophageal cancer Neg Hx    Pancreatic cancer Neg Hx    Liver disease Neg Hx     Social History   Tobacco Use   Smoking status: Former    Types: Cigarettes    Quit date: 09/18/1977    Years since quitting: 45.3   Smokeless tobacco: Never   Tobacco comments:    smoked 1957-1979 , up to 1 ppd  Vaping Use   Vaping Use: Never used  Substance Use Topics   Alcohol use: No    Comment:  very rarely   Drug use: No    Prior to Admission medications   Medication Sig Start Date End Date Taking? Authorizing Provider  amLODipine (NORVASC) 5 MG tablet TAKE 1 TABLET BY MOUTH EVERY DAY FOR HYPERTENSION 10/23/22   Pincus Sanes, MD  aspirin 81 MG chewable tablet Chew 1 tablet (81 mg total) by mouth daily. 02/07/20   Matcha, Darrold Junker, MD  carbidopa-levodopa (SINEMET IR) 25-100 MG tablet Take 1.5 tablets by mouth 4 (four) times daily. 12/20/22   Everlena Cooper, Adam R, DO  carvedilol (COREG) 25 MG tablet TAKE 1 TABLET BY MOUTH 2 TIMES A DAY FOR HYPERTENSION 10/23/22   Pincus Sanes, MD  Cyanocobalamin (VITAMIN B-12) 5000 MCG SUBL Place 5,000 mcg under the tongue  daily.     [provider]  escitalopram (LEXAPRO) 10 MG tablet Take 1 tablet (10 mg total) by mouth daily. 12/20/22   Drema Dallas, DO  metFORMIN (GLUCOPHAGE-XR) 750 MG 24 hr tablet Take 1 tablet (750 mg total) by mouth daily with supper. 10/23/22   Pincus Sanes, MD  Multiple Vitamin (MULTIVITAMIN WITH MINERALS) TABS tablet Take 1 tablet by mouth daily.    [provider]  Omega-3 Fatty Acids (FISH OIL CONCENTRATE) 1000 MG CAPS Take 1,000 mg by mouth  2 (two) times daily.    [provider]    Allergies Elemental sulfur, Exemestane, Morphine and related, Statins, Ace inhibitors, and Codeine   REVIEW OF SYSTEMS  Negative except as noted here or in the History of Present Illness.   PHYSICAL EXAMINATION  Initial Vital Signs Blood pressure (!) 123/52, pulse 73, temperature 99 F (37.2 C), temperature source Oral, resp. rate (!) 26, weight 63.5 kg, SpO2 94 %.  Examination General: Well-developed, well-nourished female in no acute distress; appearance consistent with age of record HENT: normocephalic; occipital scalp hematoma Eyes: Normal appearance Neck: supple; nontender Heart: regular rate and rhythm Lungs: Inspiratory and expiratory wheezing Abdomen: soft; nondistended; nontender; hepatomegaly; bowel sounds present; healing contusion left flank Extremities: No deformity; full range of motion; pulses normal Neurologic: Awake, alert; motor function intact in all extremities and symmetric; no facial droop Skin: Warm and dry Psychiatric: Normal mood and affect   RESULTS  Summary of this visit's results, reviewed and interpreted by myself:   EKG Interpretation  Date/Time:  Sunday January 07 2023 22:27:59 EDT Ventricular Rate:  87 PR Interval:  143 QRS Duration: 123 QT Interval:  408 QTC Calculation: 491 R Axis:   -51 Text Interpretation: Sinus rhythm Left bundle branch block No significant change was found Confirmed by Will Schier ([id number]) on 01/07/2023  10:49:20 PM       Laboratory Studies: Results for orders placed or performed during the hospital encounter of 01/07/23 (from the past 24 hour(s))  CBC with Differential     Status: Abnormal   Collection Time: 01/07/23 10:56 PM  Result Value Ref Range   WBC 5.1 4.0 - 10.5 K/uL   RBC 3.91 3.87 - 5.11 MIL/uL   Hemoglobin 11.8 (L) 12.0 - 15.0 g/dL   HCT 60.4 (L) 54.0 - 98.1 %   MCV 91.3 80.0 - 100.0 fL   MCH 30.2 26.0 - 34.0 pg   MCHC 33.1 30.0 - 36.0 g/dL   RDW 19.1 47.8 - 29.5 %   Platelets 132 (L) 150 - 400 K/uL   nRBC 0.0 0.0 - 0.2 %   Neutrophils Relative % 68 %   Neutro Abs 3.5 1.7 - 7.7 K/uL   Lymphocytes Relative 15 %   Lymphs Abs 0.7 0.7 - 4.0 K/uL   Monocytes Relative 16 %   Monocytes Absolute 0.8 0.1 - 1.0 K/uL   Eosinophils Relative 1 %   Eosinophils Absolute 0.0 0.0 - 0.5 K/uL   Basophils Relative 0 %   Basophils Absolute 0.0 0.0 - 0.1 K/uL   Immature Granulocytes 0 %   Abs Immature Granulocytes 0.02 0.00 - 0.07 K/uL  Comprehensive metabolic panel     Status: Abnormal   Collection Time: 01/07/23 10:56 PM  Result Value Ref Range   Sodium 135 135 - 145 mmol/L   Potassium 4.3 3.5 - 5.1 mmol/L   Chloride 102 98 - 111 mmol/L   CO2 22 22 - 32 mmol/L   Glucose, Bld 126 (H) 70 - 99 mg/dL   BUN 24 (H) 8 - 23 mg/dL   Creatinine, Ser 6.21 (H) 0.44 - 1.00 mg/dL   Calcium 8.6 (L) 8.9 - 10.3 mg/dL   Total Protein 6.4 (L) 6.5 - 8.1 g/dL   Albumin 3.5 3.5 - 5.0 g/dL   AST 19 15 - 41 U/L   ALT 6 0 - 44 U/L   Alkaline Phosphatase 49 38 - 126 U/L   Total Bilirubin 1.8 (H) 0.3 - 1.2 mg/dL   GFR, Estimated  52 (L) >60 mL/min   Anion gap 11 5 - 15  Urinalysis, Routine w reflex microscopic -Urine, Clean Catch     Status: Abnormal   Collection Time: 01/07/23 11:29 PM  Result Value Ref Range   Color, Urine YELLOW YELLOW   APPearance CLEAR CLEAR   Specific Gravity, Urine 1.026 1.005 - 1.030   pH 5.0 5.0 - 8.0   Glucose, UA NEGATIVE NEGATIVE mg/dL   Hgb urine dipstick NEGATIVE  NEGATIVE   Bilirubin Urine NEGATIVE NEGATIVE   Ketones, ur 5 (A) NEGATIVE mg/dL   Protein, ur 30 (A) NEGATIVE mg/dL   Nitrite NEGATIVE NEGATIVE   Leukocytes,Ua MODERATE (A) NEGATIVE   RBC / HPF 0-5 0 - 5 RBC/hpf   WBC, UA 21-50 0 - 5 WBC/hpf   Bacteria, UA FEW (A) NONE SEEN   Squamous Epithelial / HPF 0-5 0 - 5 /HPF   WBC Clumps PRESENT    Mucus PRESENT    Hyaline Casts, UA PRESENT    Imaging Studies: CT Head Wo Contrast  Result Date: 01/07/2023 CLINICAL DATA:  Dizziness status post trauma. EXAM: CT HEAD WITHOUT CONTRAST TECHNIQUE: Contiguous axial images were obtained from the base of the skull through the vertex without intravenous contrast. RADIATION DOSE REDUCTION: This exam was performed according to the departmental dose-optimization program which includes automated exposure control, adjustment of the mA and/or kV according to patient size and/or use of iterative reconstruction technique. COMPARISON:  None Available. FINDINGS: Brain: There is mild to moderate severity cerebral atrophy with widening of the extra-axial spaces and ventricular dilatation. There are areas of decreased attenuation within the white matter tracts of the supratentorial brain, consistent with microvascular disease changes. Vascular: No hyperdense vessel or unexpected calcification. Skull: Normal. Negative for fracture or focal lesion. Sinuses/Orbits: No acute finding. Other: Moderate to marked severity right occipital and right posterior parietal scalp soft tissue swelling is seen extending to the level of the vertex. An associated scalp hematoma is noted within this region. IMPRESSION: 1. Moderate to marked severity right occipital and right posterior parietal scalp soft tissue swelling with an associated scalp hematoma. 2. No acute intracranial abnormality. 3. Generalized cerebral atrophy with widening of the extra-axial spaces and ventricular dilatation. Electronically Signed   By: Aram Candela M.D.   On:  01/07/2023 23:27   DG Chest 2 View  Result Date: 01/07/2023 CLINICAL DATA:  Dizziness and wheezing. EXAM: CHEST - 2 VIEW COMPARISON:  Jan 17, 2021 FINDINGS: The heart size and mediastinal contours are within normal limits. Both lungs are clear. The visualized skeletal structures are unremarkable. IMPRESSION: No active cardiopulmonary disease. Electronically Signed   By: Aram Candela M.D.   On: 01/07/2023 23:21    ED COURSE and MDM  Nursing notes, initial and subsequent vitals signs, including pulse oximetry, reviewed and interpreted by myself.  Vitals:   01/07/23 2220 01/07/23 2226 01/07/23 2228 01/07/23 2230  BP:  (!) 123/52  (!) 149/53  Pulse:  73  80  Resp:  (!) 26  (!) 22  Temp:   99 F (37.2 C)   TempSrc:   Oral   SpO2:  94%  94%  Weight: 63.5 kg      Medications  ipratropium-albuterol (DUONEB) 0.5-2.5 (3) MG/3ML nebulizer solution 3 mL (3 mLs Nebulization Given 01/07/23 2335)  cefTRIAXone (ROCEPHIN) 1 g in sodium chloride 0.9 % 100 mL IVPB (has no administration in time range)   12:37 AM Patient's head CT shows scalp hematoma but no intracranial injury or skull  fracture.  Her urinalysis shows changes consistent with a urinary tract infection and we will treat her for this.  Wheezing improved with DuoNeb treatment.  Chest x-ray does not show any infiltrate.  We will start her on antibiotics for her urinary tract infection.  We will also prescribe DuoNeb treatments as needed for wheezing.  Husband confirms that she did fall at her living facility yesterday.   PROCEDURES  Procedures   ED DIAGNOSES     ICD-10-CM   1. Fall at nursing home, initial encounter  W19.XXXA    Y92.129     2. Lower urinary tract infectious disease  N39.0     3. Scalp hematoma, initial encounter  S00.03XA     4. Acute bronchitis with bronchospasm  J20.9          Letta Cargile, Jonny Ruiz, MD 01/08/23 [phone]

## 2023-01-08 LAB — COMPREHENSIVE METABOLIC PANEL
ALT: 6 U/L (ref 0–44)
AST: 19 U/L (ref 15–41)
Albumin: 3.5 g/dL (ref 3.5–5.0)
Alkaline Phosphatase: 49 U/L (ref 38–126)
Anion gap: 11 (ref 5–15)
BUN: 24 mg/dL — ABNORMAL HIGH (ref 8–23)
CO2: 22 mmol/L (ref 22–32)
Calcium: 8.6 mg/dL — ABNORMAL LOW (ref 8.9–10.3)
Chloride: 102 mmol/L (ref 98–111)
Creatinine, Ser: 1.07 mg/dL — ABNORMAL HIGH (ref 0.44–1.00)
GFR, Estimated: 52 mL/min — ABNORMAL LOW (ref >60)
Glucose, Bld: 126 mg/dL — ABNORMAL HIGH (ref 70–99)
Potassium: 4.3 mmol/L (ref 3.5–5.1)
Sodium: 135 mmol/L (ref 135–145)
Total Bilirubin: 1.8 mg/dL — ABNORMAL HIGH (ref 0.3–1.2)
Total Protein: 6.4 g/dL — ABNORMAL LOW (ref 6.5–8.1)

## 2023-01-08 LAB — URINALYSIS, ROUTINE W REFLEX MICROSCOPIC
Bilirubin Urine: NEGATIVE
Glucose, UA: NEGATIVE mg/dL
Hgb urine dipstick: NEGATIVE
Ketones, ur: 5 mg/dL — AB
Nitrite: NEGATIVE
Protein, ur: 30 mg/dL — AB
Specific Gravity, Urine: 1.026 (ref 1.005–1.030)
pH: 5 (ref 5.0–8.0)

## 2023-01-08 MED ORDER — IPRATROPIUM-ALBUTEROL 0.5-2.5 (3) MG/3ML IN SOLN: RESPIRATORY_TRACT | 0 refills | Status: AC

## 2023-01-08 MED ORDER — CEPHALEXIN 500 MG PO CAPS: 500.0000 mg | ORAL_CAPSULE | Freq: Two times a day (BID) | ORAL | 0 refills | Status: AC

## 2023-01-08 MED ORDER — SODIUM CHLORIDE 0.9 % IV SOLN
1.0000 g | Freq: Once | INTRAVENOUS | Status: AC
  Administered 2023-01-08: 1 g via INTRAVENOUS
  Filled 2023-01-08: qty 10

## 2023-01-10 DIAGNOSIS — E1142 Type 2 diabetes mellitus with diabetic polyneuropathy: Secondary | ICD-10-CM | POA: Diagnosis not present

## 2023-01-10 DIAGNOSIS — G20A1 Parkinson's disease without dyskinesia, without mention of fluctuations: Secondary | ICD-10-CM | POA: Diagnosis not present

## 2023-01-10 DIAGNOSIS — F32A Depression, unspecified: Secondary | ICD-10-CM | POA: Diagnosis not present

## 2023-01-10 DIAGNOSIS — I10 Essential (primary) hypertension: Secondary | ICD-10-CM | POA: Diagnosis not present

## 2023-01-10 DIAGNOSIS — F0283 Dementia in other diseases classified elsewhere, unspecified severity, with mood disturbance: Secondary | ICD-10-CM | POA: Diagnosis not present

## 2023-01-10 LAB — URINE CULTURE: Culture: >=100000 — AB

## 2023-01-11 ENCOUNTER — Telehealth (HOSPITAL_BASED_OUTPATIENT_CLINIC_OR_DEPARTMENT_OTHER): Payer: Self-pay | Admitting: *Deleted

## 2023-01-11 NOTE — Telephone Encounter (Signed)
Post ED Visit - Positive Culture Follow-up  Culture report reviewed by antimicrobial stewardship pharmacist: [facility]  [address], Pharm.D.  Celedonio Miyamoto, Pharm.D., BCPS AQ-ID  Garvin Fila, Pharm.D., BCPS  Georgina Pillion, Pharm.D., BCPS  [place], [place].D., BCPS, AAHIVP  Estella Husk, Pharm.D., BCPS, AAHIVP  Lysle Pearl, PharmD, BCPS  Phillips Climes, PharmD, BCPS  Agapito Games, PharmD, BCPS  Verlan Friends, PharmD  Mervyn Gay, PharmD, BCPS  Vinnie Level, PharmD  [facility]  Len Childs, PharmD  Greer Pickerel, PharmD  Adalberto Cole, PharmD  Perlie Gold, Rph  Lonell Face) Jean Rosenthal, PharmD  Earl Many, PharmD  Junita Push, PharmD  Dorna Leitz, PharmD  Terrilee Files, PharmD  Lynann Beaver, PharmD  Keturah Barre, PharmD  Loralee Pacas, PharmD  Bernadene Person, PharmD   Positive urine culture Treated with Cephalexin, organism sensitive to the same and no further patient follow-up is required at this time.  Sharin Mons, Pharm D  Virl Axe Talley 01/11/2023, 11:11 AM

## 2023-01-12 DIAGNOSIS — G20A1 Parkinson's disease without dyskinesia, without mention of fluctuations: Secondary | ICD-10-CM | POA: Diagnosis not present

## 2023-01-22 ENCOUNTER — Ambulatory Visit: Payer: Self-pay | Admitting: Licensed Clinical Social Worker

## 2023-01-22 NOTE — Patient Outreach (Signed)
Care Coordination   Follow Up Visit Note   01/22/2023 Name: [name] MRN: [id number] DOB: 11-15-1940  PATRECIA Meyers is a 82 y.o. year old female who sees Burns, Bobette Mo, MD for primary care. I spoke with  [name] / [name], spouse of client, via phone today   What matters to the patients health and wellness today?  Patient is at risk for flls. She needs help with ADLs.  She has had recent falls    Goals Addressed             This Visit's Progress    Patient is at risk for falls. She needs some help witih ADLs.  She has had recent falls       Interventions:  LCSW spoke via phone today  with [name] , spouse of client, about client needs Remi Deter reported that client has fallen several times lately at care facility. 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 Client sees neurologist as scheduled for medical care Rayniah Bendixen, spouse of client, has talked with facility Administrator about client falls and management of client needs at facility Remi Deter said that application for Long Term Care insurance has been completed and has been sent in to insurance company.  Remi Deter is hoping LTC insurance will be able to help with some of clients ongoing care needs Discussed walking of client. She uses rollator walking. Remi Deter said client has balance issues. Client has had physical therapy support and Speech Therapy support Reviewed vision of client. Remi Deter said he did not think vision of client was affecting her walking abilities Discussed client use of call bell and her understanding when to use call bell. Provided counseling support for [name] related to managing ongoing care needs of [name] Discussed spouses frequency of visit to ALF to see client Remi Deter asked if LCSW could call him back in 4 weeks to discuss client needs at that time. LCSW agreed to plan        SDOH assessments and interventions completed:  Yes  SDOH Interventions Today     Flowsheet Row Most Recent Value  SDOH Interventions   Depression Interventions/Treatment  Medication  Physical Activity Interventions Other (Comments)  [risk for falls, uses rollator to help her walk]  Stress Interventions Other (Comment)  [stress related to managing medical needs]        Care Coordination Interventions:  Yes, provided   Interventions Today    Flowsheet Row Most Recent Value  Chronic Disease   Chronic disease during today's visit Other  [spoke with [name] , spouse of client, about client needs]  General Interventions   General Interventions Discussed/Reviewed General Interventions Discussed, [employer] program support for client]  Exercise Interventions   Exercise Discussed/Reviewed Physical Activity  [spoke of client use of rollator]  Physical Activity Discussed/Reviewed Physical Activity Discussed  [client is at risk for falls]  Education Interventions   Education Provided Provided Education  Provided Verbal Education On [employer]  [discussed client support with RN, Teacher, early years/pre and LCSW]  Mental Health Interventions   Mental Health Discussed/Reviewed Anxiety, Coping Strategies  [client likes to attend facility activities]  Nutrition Interventions   Nutrition Discussed/Reviewed Nutrition Discussed  Pharmacy Interventions   Pharmacy Dicussed/Reviewed Pharmacy Topics Discussed  Safety Interventions   Safety Discussed/Reviewed Fall Risk        Follow up plan: Follow up call scheduled for 02/26/23 at 9:00 AM    Encounter Outcome:  Pt. Visit Completed   Kelton Pillar.Tyrena Gohr MSW,  LCSW Licensed Clinical Social Worker [facility] Care Management [phone]

## 2023-01-22 NOTE — Patient Instructions (Signed)
Visit Information  Thank you for taking time to visit with me today. Please don't hesitate to contact me if I can be of assistance to you.   Following are the goals we discussed today:   Goals Addressed             This Visit's Progress    Patient is at risk for falls. She needs some help witih ADLs.  She has had recent falls       Interventions:  LCSW spoke via phone today  with [name] , spouse of client, about client needs Remi Deter reported that client has fallen several times lately at care facility. 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 Client sees neurologist as scheduled for medical care Leidy Bodin, spouse of client, has talked with facility Administrator about client falls and management of client needs at facility Remi Deter said that application for Long Term Care insurance has been completed and has been sent in to insurance company.  Remi Deter is hoping LTC insurance will be able to help with some of clients ongoing care needs Discussed walking of client. She uses rollator walking. Remi Deter said client has balance issues. Client has had physical therapy support and Speech Therapy support Reviewed vision of client. Remi Deter said he did not think vision of client was affecting her walking abilities Discussed client use of call bell and her understanding when to use call bell. Provided counseling support for [place] related to managing ongoing care needs of Lititia Posa Discussed spouse's frequency of visit to ALF to see client Remi Deter asked if LCSW could call him back in 4 weeks to discuss client needs at that time. LCSW agreed to plan        Our next appointment is by telephone on 02/26/23 at 9:00 AM   Please call the care guide team at [phone] if you need to cancel or reschedule your appointment.   If you are experiencing a Mental Health or Behavioral Health Crisis or need someone to talk to, please go to [facility] [address] [place] [phone])   The patient / Alizon Koellner, spouse, verbalized understanding of instructions, educational materials, and care plan provided today and DECLINED offer to receive copy of patient instructions, educational materials, and care plan.   The patient / Sherhonda Mickiewicz, spouse, has been provided with contact information for the care management team and has been advised to call with any health related questions or concerns.   Kelton Pillar.Wyman Meschke MSW, LCSW Licensed Visual merchandiser [facility] Care Management [phone]

## 2023-01-26 ENCOUNTER — Telehealth: Payer: Self-pay | Admitting: Internal Medicine

## 2023-01-26 ENCOUNTER — Other Ambulatory Visit: Payer: Self-pay

## 2023-01-26 MED ORDER — METFORMIN HCL ER 750 MG PO TB24: 750.0000 mg | ORAL_TABLET | Freq: Every day | ORAL | 3 refills | Status: DC

## 2023-01-26 NOTE — Telephone Encounter (Signed)
Spoke with husband today.  New script sent to pharmacy as she is only taking one tablet and not 2.

## 2023-01-26 NOTE — Telephone Encounter (Signed)
Pharmacy called needing dosage change the Rx the pharmacy has is 2 times by mouth and the one we have is 1 tablet by mouth at supper. The pharmacy just want the new Rx sent over please advise.

## 2023-01-29 ENCOUNTER — Ambulatory Visit: Payer: Medicare Other | Admitting: Internal Medicine

## 2023-01-29 DIAGNOSIS — E119 Type 2 diabetes mellitus without complications: Secondary | ICD-10-CM | POA: Diagnosis not present

## 2023-01-29 LAB — HM DIABETES EYE EXAM

## 2023-01-31 ENCOUNTER — Encounter: Payer: Self-pay | Admitting: Internal Medicine

## 2023-01-31 NOTE — Patient Instructions (Addendum)
      Blood work was ordered.   The lab is on the first floor.    Medications changes include :   none      Return in about 6 months (around 08/04/2023) for Physical Exam.  

## 2023-01-31 NOTE — Progress Notes (Unsigned)
Subjective:    Patient ID: [name], female    DOB: [birth year], 82 y.o.   MRN: [id number]     HPI [name] is here for follow up of her chronic medical problems.  She has had several falls.    She eats two meals a day.  No exercise.    Medications and allergies reviewed with patient and updated if appropriate.  Current Outpatient Medications on File Prior to Visit  Medication Sig Dispense Refill   amLODipine (NORVASC) 5 MG tablet TAKE 1 TABLET BY MOUTH EVERY DAY FOR HYPERTENSION 30 tablet 4   aspirin 81 MG chewable tablet Chew 1 tablet (81 mg total) by mouth daily. 30 tablet 0   carbidopa-levodopa (SINEMET IR) 25-100 MG tablet Take 1.5 tablets by mouth 4 (four) times daily. 540 tablet 1   carvedilol (COREG) 25 MG tablet TAKE 1 TABLET BY MOUTH 2 TIMES A DAY FOR HYPERTENSION 60 tablet 4   Cyanocobalamin (VITAMIN B-12) 5000 MCG SUBL Place 5,000 mcg under the tongue daily.      escitalopram (LEXAPRO) 10 MG tablet Take 1 tablet (10 mg total) by mouth daily. 90 tablet 1   ipratropium-albuterol (DUONEB) 0.5-2.5 (3) MG/3ML SOLN Administer 3 mL by nebulization every 4 hours as needed for wheezing. 126 mL 0   metFORMIN (GLUCOPHAGE-XR) 750 MG 24 hr tablet Take 1 tablet (750 mg total) by mouth daily with supper. 90 tablet 3   Multiple Vitamin (MULTIVITAMIN WITH MINERALS) TABS tablet Take 1 tablet by mouth daily.     Omega-3 Fatty Acids (FISH OIL CONCENTRATE) 1000 MG CAPS Take 1,000 mg by mouth 2 (two) times daily.     No current facility-administered medications on file prior to visit.     Review of Systems  Constitutional:  Negative for fever.  Respiratory:  Positive for cough (occ). Negative for shortness of breath and wheezing.   Cardiovascular:  Negative for chest pain, palpitations and leg swelling.  Gastrointestinal:  Negative for abdominal pain, constipation and diarrhea.       No gerd  Neurological:  Negative for dizziness, light-headedness and headaches.        Objective:   Vitals:   02/01/23 1106  BP: 114/78  Pulse: 84  Temp: 98.2 F (36.8 C)  SpO2: 94%   BP Readings from Last 3 Encounters:  02/01/23 114/78  01/08/23 129/80  12/20/22 105/63   Wt Readings from Last 3 Encounters:  01/07/23 140 lb (63.5 kg)  12/20/22 143 lb 6.4 oz (65 kg)  08/29/22 145 lb (65.8 kg)   Body mass index is 24.8 kg/m.    Physical Exam Constitutional:      General: She is not in acute distress.    Appearance: Normal appearance.  HENT:     Head: Normocephalic and atraumatic.  Eyes:     Conjunctiva/sclera: Conjunctivae normal.  Cardiovascular:     Rate and Rhythm: Normal rate and regular rhythm.     Heart sounds: Normal heart sounds.  Pulmonary:     Effort: Pulmonary effort is normal. No respiratory distress.     Breath sounds: Normal breath sounds. No wheezing.  Musculoskeletal:     Cervical back: Neck supple.     Right lower leg: No edema.     Left lower leg: No edema.  Lymphadenopathy:     Cervical: No cervical adenopathy.  Skin:    General: Skin is warm and dry.     Findings: No rash.  Neurological:  Mental Status: She is alert. Mental status is at baseline.  Psychiatric:        Mood and Affect: Mood normal.        Behavior: Behavior normal.        Lab Results  Component Value Date   WBC 5.1 01/07/2023   HGB 11.8 (L) 01/07/2023   HCT 35.7 (L) 01/07/2023   PLT 132 (L) 01/07/2023   GLUCOSE 126 (H) 01/07/2023   CHOL 152 07/31/2022   TRIG 137.0 07/31/2022   HDL 39.40 07/31/2022   LDLDIRECT 108.0 08/15/2017   LDLCALC 85 07/31/2022   ALT 6 01/07/2023   AST 19 01/07/2023   NA 135 01/07/2023   K 4.3 01/07/2023   CL 102 01/07/2023   CREATININE 1.07 (H) 01/07/2023   BUN 24 (H) 01/07/2023   CO2 22 01/07/2023   TSH 2.37 07/31/2022   INR 1.1 (H) 04/27/2020   HGBA1C 5.7 07/31/2022   MICROALBUR 3.6 (H) 07/31/2022     Assessment & Plan:    See Problem List for Assessment and Plan of chronic medical problems.

## 2023-02-01 ENCOUNTER — Ambulatory Visit (INDEPENDENT_AMBULATORY_CARE_PROVIDER_SITE_OTHER): Payer: Medicare Other | Admitting: Internal Medicine

## 2023-02-01 VITALS — BP 114/78 | HR 84 | Temp 98.2°F | Ht 63.0 in

## 2023-02-01 DIAGNOSIS — I480 Paroxysmal atrial fibrillation: Secondary | ICD-10-CM

## 2023-02-01 DIAGNOSIS — G20A1 Parkinson's disease without dyskinesia, without mention of fluctuations: Secondary | ICD-10-CM

## 2023-02-01 DIAGNOSIS — E1122 Type 2 diabetes mellitus with diabetic chronic kidney disease: Secondary | ICD-10-CM | POA: Diagnosis not present

## 2023-02-01 DIAGNOSIS — I10 Essential (primary) hypertension: Secondary | ICD-10-CM | POA: Diagnosis not present

## 2023-02-01 DIAGNOSIS — N1831 Chronic kidney disease, stage 3a: Secondary | ICD-10-CM

## 2023-02-01 DIAGNOSIS — I700 Atherosclerosis of aorta: Secondary | ICD-10-CM

## 2023-02-01 DIAGNOSIS — F3289 Other specified depressive episodes: Secondary | ICD-10-CM

## 2023-02-01 LAB — LIPID PANEL
Cholesterol: 135 mg/dL (ref 0–200)
HDL: 38 mg/dL — ABNORMAL LOW (ref >39.00)
LDL Cholesterol: 70 mg/dL (ref 0–99)
NonHDL: 96.87
Total CHOL/HDL Ratio: 4
Triglycerides: 136 mg/dL (ref 0.0–149.0)
VLDL: 27.2 mg/dL (ref 0.0–40.0)

## 2023-02-01 LAB — COMPREHENSIVE METABOLIC PANEL
ALT: 4 U/L (ref 0–35)
AST: 18 U/L (ref 0–37)
Albumin: 3.6 g/dL (ref 3.5–5.2)
Alkaline Phosphatase: 54 U/L (ref 39–117)
BUN: 14 mg/dL (ref 6–23)
CO2: 28 mEq/L (ref 19–32)
Calcium: 9.1 mg/dL (ref 8.4–10.5)
Chloride: 105 mEq/L (ref 96–112)
Creatinine, Ser: 0.95 mg/dL (ref 0.40–1.20)
GFR: 56.26 mL/min — ABNORMAL LOW (ref >60.00)
Glucose, Bld: 116 mg/dL — ABNORMAL HIGH (ref 70–99)
Potassium: 4.1 mEq/L (ref 3.5–5.1)
Sodium: 141 mEq/L (ref 135–145)
Total Bilirubin: 1.1 mg/dL (ref 0.2–1.2)
Total Protein: 6.1 g/dL (ref 6.0–8.3)

## 2023-02-01 LAB — CBC WITH DIFFERENTIAL/PLATELET
Basophils Absolute: 0 10*3/uL (ref 0.0–0.1)
Basophils Relative: 0.4 % (ref 0.0–3.0)
Eosinophils Absolute: 0.1 10*3/uL (ref 0.0–0.7)
Eosinophils Relative: 2.2 % (ref 0.0–5.0)
HCT: 37.9 % (ref 36.0–46.0)
Hemoglobin: 12.8 g/dL (ref 12.0–15.0)
Lymphocytes Relative: 22 % (ref 12.0–46.0)
Lymphs Abs: 1.2 10*3/uL (ref 0.7–4.0)
MCHC: 33.7 g/dL (ref 30.0–36.0)
MCV: 89.4 fl (ref 78.0–100.0)
Monocytes Absolute: 0.5 10*3/uL (ref 0.1–1.0)
Monocytes Relative: 9.8 % (ref 3.0–12.0)
Neutro Abs: 3.6 10*3/uL (ref 1.4–7.7)
Neutrophils Relative %: 65.6 % (ref 43.0–77.0)
Platelets: 178 10*3/uL (ref 150.0–400.0)
RBC: 4.24 Mil/uL (ref 3.87–5.11)
RDW: 15 % (ref 11.5–15.5)
WBC: 5.5 10*3/uL (ref 4.0–10.5)

## 2023-02-01 LAB — HEMOGLOBIN A1C: Hgb A1c MFr Bld: 5.3 % (ref 4.6–6.5)

## 2023-02-01 MED ORDER — METFORMIN HCL ER 750 MG PO TB24: 750.0000 mg | ORAL_TABLET | Freq: Every day | ORAL | 3 refills | Status: DC

## 2023-02-01 NOTE — Assessment & Plan Note (Signed)
Chronic Following with cardiology High risk of falls On Coreg 25 mg twice daily and aspirin 81 mg daily

## 2023-02-01 NOTE — Assessment & Plan Note (Addendum)
Chronic Blood pressure well-controlled  - no lightheadedness cmp Continue amlodipine 5 mg daily, Coreg 25 mg twice daily

## 2023-02-01 NOTE — Assessment & Plan Note (Addendum)
Chronic Managed by Dr. Everlena Cooper Continue Lexapro 10 mg daily

## 2023-02-01 NOTE — Assessment & Plan Note (Signed)
Chronic Management per neurology On Sinemet Doing PT

## 2023-02-01 NOTE — Assessment & Plan Note (Addendum)
Chronic Statin intolerant-hepatotoxicity Healthy diet encouraged Lipids reasonably controlled with lifestyle alone Check lipid panel today

## 2023-02-01 NOTE — Assessment & Plan Note (Addendum)
Chronic Lab Results  Component Value Date   HGBA1C 5.7 07/31/2022   Sugars very well controlled Taking more metformin - 4 pills a day -  Should be taking metformin to 750 mg daily with dinner - note sent to pharmacy that this is the correct dose

## 2023-02-01 NOTE — Assessment & Plan Note (Signed)
Chronic CMP, CBC 

## 2023-02-07 ENCOUNTER — Encounter: Payer: Self-pay | Admitting: Internal Medicine

## 2023-02-08 ENCOUNTER — Encounter: Payer: Self-pay | Admitting: Internal Medicine

## 2023-02-11 DIAGNOSIS — G20A1 Parkinson's disease without dyskinesia, without mention of fluctuations: Secondary | ICD-10-CM | POA: Diagnosis not present

## 2023-02-26 ENCOUNTER — Ambulatory Visit: Payer: Self-pay | Admitting: Licensed Clinical Social Worker

## 2023-02-26 NOTE — Patient Instructions (Signed)
Visit Information  Thank you for taking time to visit with me today. Please don't hesitate to contact me if I can be of assistance to you.   Following are the goals we discussed today:   Goals Addressed             This Visit's Progress    Patient is at risk for falls. She needs help with daily ADLs       Interventions:  Spoke with [name], spouse of client , about client needs Discussed care client is receiving at extended care facility Discussed medication procurement of client. Discussed sleeping issues of client.  Discussed ambulation of client. Client has falls occasionally. Remi Deter said client uses rollator walker to help her walk. Discussed pain issues of client Remi Deter said client is not receiving physical therapy sessions at present. Remi Deter feels that client is adjusting better to being at facility for care [facility] visits client frequently to monitor her needs Encouraged client or Remi Deter to call LCSW as needed for SW support for client at [phone]          Our next appointment is by telephone on 04/30/23 at 3:30 PM   Please call the care guide team at [phone] if you need to cancel or reschedule your appointment.   If you are experiencing a Mental Health or Behavioral Health Crisis or need someone to talk to, please go to [facility] [address], [place] [phone])   The patient / Suanne Minahan, spouse, verbalized understanding of instructions, educational materials, and care plan provided today and DECLINED offer to receive copy of patient instructions, educational materials, and care plan.   The patient / Sabrena Gavitt, spouse, has been provided with contact information for the care management team and has been advised to call with any health related questions or concerns.   Kelton Pillar.Stace Peace MSW, LCSW Licensed Visual merchandiser [facility] Care Management [phone]

## 2023-02-26 NOTE — Patient Outreach (Signed)
  Care Coordination   Follow Up Visit Note   02/26/2023 Name: [name] MRN: [id number] DOB: [birth year]  [name] is a 82 y.o. year old female who sees Burns, Bobette Mo, MD for primary care. I spoke with  [name] / [name], spouse of client via phone today.  What matters to the patients health and wellness today? Client is at risk for falls. She needs help with daily ADLs     Goals Addressed             This Visit's Progress    Patient is at risk for falls. She needs help with daily ADLs       Interventions:  Spoke with [name], spouse of client , about client needs Discussed care client is receiving at extended care facility Discussed medication procurement of client. Discussed sleeping issues of client.  Discussed ambulation of client. Client has falls occasionally. Remi Deter said client uses rollator walker to help her walk. Discussed pain issues of client Remi Deter said client is not receiving physical therapy sessions at present. Remi Deter feels that client is adjusting better to being at facility for care [facility] visits client frequently to monitor her needs Encouraged client or Remi Deter to call LCSW as needed for SW support for client at [phone]          SDOH assessments and interventions completed:  Yes  SDOH Interventions Today    Flowsheet Row Most Recent Value  SDOH Interventions   Depression Interventions/Treatment  --  [informed [name], spouse of client, of LCSW support , of RN support, of Pharmacist support]  Physical Activity Interventions Other (Comments)  [uses rollator walker as needed]  Stress Interventions Other (Comment)  [has stress in managing daily needs. stress in managing medical needs]        Care Coordination Interventions:  Yes, provided   Interventions Today    Flowsheet Row Most Recent Value  Chronic Disease   Chronic disease during today's visit Other  [spoke with [name],  spouse of client, about client needs]  General Interventions   General Interventions Discussed/Reviewed General Interventions Discussed, [employer] program support with RN, LCSW, Pharmacist]  Exercise Interventions   Exercise Discussed/Reviewed Physical Activity  [uses rollator walker to help her walk. History of falls]  Physical Activity Discussed/Reviewed Physical Activity Discussed  [Fall history]  Mental Health Interventions   Mental Health Discussed/Reviewed Anxiety  [coping with current needs. Receives frequent visits from her spouse. Receives care from facility staff]  Nutrition Interventions   Nutrition Discussed/Reviewed Nutrition Discussed  Pharmacy Interventions   Pharmacy Dicussed/Reviewed Pharmacy Topics Discussed  Safety Interventions   Safety Discussed/Reviewed Fall Risk        Follow up plan: Follow up call scheduled for 04/30/23 at 3:30 PM     Encounter Outcome:  Pt. Visit Completed   Kelton Pillar.Ezinne Yogi MSW, LCSW Licensed Visual merchandiser [facility] Care Management [phone]

## 2023-03-14 DIAGNOSIS — G20A1 Parkinson's disease without dyskinesia, without mention of fluctuations: Secondary | ICD-10-CM | POA: Diagnosis not present

## 2023-03-15 ENCOUNTER — Telehealth: Payer: Self-pay | Admitting: Internal Medicine

## 2023-03-15 DIAGNOSIS — G20A1 Parkinson's disease without dyskinesia, without mention of fluctuations: Secondary | ICD-10-CM

## 2023-03-15 NOTE — Telephone Encounter (Signed)
Spoke with Caryn Bee and made him aware that referral was placed and Dr. Lawerance Bach will be attending.

## 2023-03-15 NOTE — Telephone Encounter (Signed)
Pt's family is requesting a referral for hospice services through [facility].   Caryn Bee, an [facility] rep is requesting Dr. Lawerance Bach start the order and act as the residing physician.   Please call Caryn Bee with any questions: [phone]

## 2023-03-15 NOTE — Telephone Encounter (Signed)
Referral ordered.  I will be attending.  Please let Caryn Bee know the referral was ordered

## 2023-03-18 ENCOUNTER — Encounter: Payer: Self-pay | Admitting: Internal Medicine

## 2023-04-13 DIAGNOSIS — G20A1 Parkinson's disease without dyskinesia, without mention of fluctuations: Secondary | ICD-10-CM | POA: Diagnosis not present

## 2023-04-30 ENCOUNTER — Ambulatory Visit: Payer: Self-pay | Admitting: Licensed Clinical Social Worker

## 2023-04-30 NOTE — Patient Outreach (Signed)
  Care Coordination   Follow Up Visit Note   04/30/2023 Name: [name] MRN: [id number] DOB: [birth year]  [name] is a 82 y.o. year old female who sees Burns, Bobette Mo, MD for primary care. I spoke with  [name] / [name], spouse of client via phone today.  What matters to the patients health and wellness today?   Patient is now residing at ALF. She is getting daily help with ADLs; she is at risk for falls. She has recenlty started receiving care with [facility].    Goals Addressed             This Visit's Progress    Patient is now residing at ALF. She is getting daily help with ADLs; she is at risk for falls. She has recenlty started receiving care with [facility].       Interventions:  LCSW spoke via phone today with [name], spouse of client, about client status.  Client is at ALF facility and receiving daily care at ALF facility. [name] said client recently started receiving services with [facility]. [name]  said he is pleased with the care client receives from [facility]. Client has been at risk for falls. She has support with daily ADLs, meals, medications.  Provided counseling support for [name] related to needs of client Encouraged [name] to call LCSW to discuss SW needs of client at [phone]        SDOH assessments and interventions completed:  Yes  SDOH Interventions Today    Flowsheet Row Most Recent Value  SDOH Interventions   Depression Interventions/Treatment  Currently on Treatment  Physical Activity Interventions Other (Comments)  [mobility issues]  Stress Interventions Other (Comment)  [has stress in managing daily activities. has stress in managing  medical needs]        Care Coordination Interventions:  Yes, provided   Interventions Today    Flowsheet Row Most Recent Value  Chronic Disease   Chronic disease during today's visit Other  [spoke with [name], spouse of client, about client needs]  General Interventions   General Interventions Discussed/Reviewed General Interventions Discussed, Community Resources  Education Interventions   Education Provided Provided Education  Provided Verbal Education On [employer]  [discussed support received by client. [name] said client is now receiving care with [facility].  He is pleased with care she is receiving with [facility]]        Follow up plan: LCSW has provided [name] with LCSW name and phone number [phone]).  LCSW has encouraged [name] to call LCSW as needed to discuss needs of client   Encounter Outcome:  Pt. Visit Completed   [name]. MSW, LCSW Licensed Visual merchandiser [facility] Care Management [phone]

## 2023-04-30 NOTE — Patient Instructions (Signed)
Visit Information  Thank you for taking time to visit with me today. Please don't hesitate to contact me if I can be of assistance to you.   Following are the goals we discussed today:   Goals Addressed             This Visit's Progress    Patient is now residing at ALF. She is getting daily help with ADLs; she is at risk for falls. She has recenlty started receiving care with [facility].       Interventions:  LCSW spoke via phone today with [name], spouse of client, about client status.  Client is at ALF facility and receiving daily care at ALF facility. Remi Deter said client recently started receiving services with [facility]. Remi Deter  said he is pleased with the care client receives from [facility]. Client has been at risk for falls. She has support with daily ADLs, meals, medications.  Provided counseling support for Remi Deter related to needs of client Encouraged Naliah Keesling to call LCSW to discuss SW needs of client at [phone]       Takeyah Wickson, spouse of client, has name and phone number for LCSW [phone]).  LCSW has encouraged Remi Deter to call LCSW as needed to discuss SW needs of client   Please call the care guide team at [phone] if you need to cancel or reschedule your appointment.   If you are experiencing a Mental Health or Behavioral Health Crisis or need someone to talk to, please go to [facility] [address] [place] [phone])   The patient / [name], spouse of client, verbalized understanding of instructions, educational materials, and care plan provided today and DECLINED offer to receive copy of patient instructions, educational materials, and care plan.   The patient [name], spouse of client,  has been provided with contact information for the care management team and has been advised to call with any health related questions or concerns.    Kelton Pillar. MSW, LCSW Licensed Visual merchandiser [facility] Care Management [phone]

## 2023-05-14 DIAGNOSIS — G20A1 Parkinson's disease without dyskinesia, without mention of fluctuations: Secondary | ICD-10-CM | POA: Diagnosis not present

## 2023-05-22 ENCOUNTER — Ambulatory Visit (HOSPITAL_COMMUNITY): Payer: Medicare Other

## 2023-05-23 ENCOUNTER — Other Ambulatory Visit: Payer: Self-pay | Admitting: Neurology

## 2023-05-28 ENCOUNTER — Ambulatory Visit (HOSPITAL_COMMUNITY)
Admission: RE | Admit: 2023-05-28 | Discharge: 2023-05-28 | Disposition: A | Discharge status: Home / Self Care | Source: Ambulatory Visit | Attending: Otolaryngology | Admitting: Otolaryngology

## 2023-05-28 DIAGNOSIS — E079 Disorder of thyroid, unspecified: Secondary | ICD-10-CM | POA: Diagnosis not present

## 2023-05-28 DIAGNOSIS — E041 Nontoxic single thyroid nodule: Secondary | ICD-10-CM | POA: Diagnosis not present

## 2023-06-20 NOTE — Progress Notes (Signed)
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  Total time spent using chart and face to face with patient and husband:  40 minutes.   Subjective:  [name] is a 82 year old female with diabetes, hypertension, Gilbert's syndrome, and irritable bowel syndrome who follows up for Parkinsonism  She is accompanied by her husband who supplements history.   UPDATE: Current medication:  carbidopa-levodopa 25/100mg  1.5 tablets four times daily (8AM, 2 PM, 4PM, 10PM); escitalopram 10mg  daily   Since 10/02/2022, she has been residing at [facility].   She was seen in the ED on 4/21 for fall.  She had felt lightheaded and fell striking the back of her head.  CT head personally reviewed showed scalp hematoma but no skull fracture or acute intracranial abnormality.  Balance continues to be deteriorate.  Has continued to have falls.  She can no longer use the walker.  Now uses wheelchair.  Denies double vision, dysphagia, or hallucinations.  Does not see any improvement in symptoms with carbidopa-levodopa.  Having more difficulty with word-finding and sometimes has trouble understanding others but overall her mind is "sharp".    Son last week passed away unexpectedly due to a "brain bleed".       HISTORY: Since summer 2018, she has had trouble with her penmanship.  She states it is more difficult to write and that her handwriting is smaller.  She denies cramping.   In early 2019, she started having increased problems with balance.  She needs to push off in order to stand.  She feels weak in the legs.  She denies back  pain or radicular pain and numbness in the feet.  She denies neck pain.  She denies freezing when initiating taking a step.  She denies dizziness or lightheadedness.  She was started on Sinemet in late 2019 and she noted some improvement in movement.  Due to presence of Babinski, she had an MRI of brain on 10/19/2018 which showed no acute abnormality, and MRI of cervical spine on 11/04/2018 which showed arthritic changes but no significant stenosis causing myelopathy   No history of REM sleep behavior disorder.  There is no family history of PD.  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 She underwent right total mastectomy with sentinel node biopsy and left total (prophylactic) mastectomy on 10/12/2010. She underwent immediate reconstruction with expander and AlloDerm placement. 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 One lymph   Complicated UTI (urinary tract infection) 01/31/2020   Diabetes mellitus    Dizziness 02/24/2019   Elevated LFTs 08/10/2014   Episodic lightheadedness 08/27/2019   Esophageal reflux 05/06/2008   Essential hypertension 02/16/2009   Family history of breast cancer    Family history of ovarian cancer    Fatigue 02/24/2019   Fatty liver 08/05/2015   Genetic  testing 09/04/2017   Negative genetic testing on the common hereditary cancer panel.  The Hereditary Gene Panel offered by [facility] includes sequencing and/or deletion duplication testing of the following 47 genes: APC, ATM, AXIN2, BARD1, BMPR1A, BRCA1, BRCA2, BRIP1, CDH1, CDK4, CDKN2A (p14ARF), CDKN2A (p16INK4a), CHEK2, CTNNA1, DICER1,  EPCAM (Deletion/duplication testing only), GREM1 (promoter region deletion/duplicat   GERD (gastroesophageal reflux disease)    Sullivan Lone syndrome    GILBERT'S SYNDROME 02/06/2007   Qualifier: Diagnosis of  By: Wendall Stade     History of breast cancer 08/04/2015   S/p b/l mastectomy   History of colonic polyps 06/06/2010   Annotation: destroyed, no clear adenomatous proven Qualifier: Diagnosis of  By: Leone Payor MD, Alfonse Ras E  2008 polyp 2013 neg     Hypertension    Hypertriglyceridemia 09/30/2007   statin intolerant  Father MI @ 23 Sister CVA > 65    Hypokalemia due to inadequate potassium intake 01/31/2020   IBS (irritable bowel syndrome)    Joint pain 09/02/2010   Lactic acidosis 01/31/2020   Osteoarthritis of left knee 08/04/2015   Significant arthritis in knees, limits her activity    Osteopenia 08/31/2013   [facility]  DEXA 03/20/2018: Osteopenia:  LFN -1.3, R--1.0, spine -0.6-statistically significant increase in BMD in all areas  Findings 08/22/13 : lowest T score -  1.3  @  R femoral neck (hip) ,3.4  %loss @ R femur &   4.1 % in spine   since  2012 Dexa 01/05/16: lowest  - L femur neck T -1.6 Diagnosis: mild Osteopenia Rx: Fosamax remotely; now on Arimidex    Parkinsonian features 01/31/2020   Peripheral neuropathy 08/23/2018   Plantar fasciitis of right foot    Dr Jaquita Rector   Poor balance 02/20/2018   S/P bilateral mastectomy 08/04/2015   Septic shock (HCC) 01/31/2020   Severe sepsis with acute organ dysfunction due to gram-negative bacteria (HCC) 01/31/2020   Transfusion history 1976   Type 2 diabetes mellitus with stage 3 chronic kidney disease, without long-term current use of insulin (HCC) 08/11/2008   Ophth, Dr Dagoberto Ligas: no retinopathy  Diabetes maternal grandmother    MEDICATIONS: Current Outpatient Medications on File Prior to Visit  Medication Sig Dispense Refill   amLODipine (NORVASC) 5 MG tablet TAKE 1 TABLET BY MOUTH EVERY DAY FOR HYPERTENSION 30 tablet 4   aspirin 81 MG  chewable tablet Chew 1 tablet (81 mg total) by mouth daily. 30 tablet 0   carbidopa-levodopa (SINEMET IR) 25-100 MG tablet Take 1.5 tablets by mouth 4 (four) times daily. 540 tablet 1   carvedilol (COREG) 25 MG tablet TAKE 1 TABLET BY MOUTH 2 TIMES A DAY FOR HYPERTENSION 60 tablet 4   escitalopram (LEXAPRO) 10 MG tablet TAKE 1 TABLET BY MOUTH EVERY DAY FOR MOOD 30 tablet 0   ipratropium-albuterol (DUONEB) 0.5-2.5 (3) MG/3ML SOLN Administer 3 mL by nebulization every 4 hours as needed for wheezing. 126 mL 0   metFORMIN (GLUCOPHAGE-XR) 750 MG 24 hr tablet Take 1 tablet (750 mg total) by mouth daily with supper. 90 tablet 3   No current facility-administered medications on file prior to visit.    ALLERGIES: Allergies  Allergen Reactions   Elemental Sulfur     Flushed, funny feeling in throat    Exemestane Nausea Only    Other reaction(s): Dizziness (intolerance)   Morphine And Codeine    Statins Other (See Comments)    Elevated LFTs   Ace Inhibitors     REACTION: COUGH   Codeine  REACTION: VOMITTING    FAMILY HISTORY: Family History  Problem Relation Age of Onset   Heart attack Father 62   Breast cancer Sister 35        bilateral    Heart failure Sister        PMH intensive chemotherapy   Hypertension Sister    Stroke Sister 82   Stroke Mother        TMI   Breast cancer Maternal Aunt 40   Diabetes Maternal Grandmother    Ovarian cancer Paternal Grandmother    Breast cancer Maternal Aunt 103   Colon cancer Neg Hx    Stomach cancer Neg Hx    Esophageal cancer Neg Hx    Pancreatic cancer Neg Hx    Liver disease Neg Hx       Objective:  Blood pressure (!) 135/57, pulse 63, height 5\' 4"  (1.626 m), weight 142 lb (64.4 kg), SpO2 98%. General: No acute distress.  Patient appears well-groomed.   Head:  Normocephalic/atraumatic Eyes:  Fundi examined but not visualized Neck: supple, no paraspinal tenderness, full range of motion Heart:  Regular rate and  rhythm Neurological Exam: alert and oriented.  Speech fluent and not dysarthric.  Reduced upward gaze.  Hypophonia.  Otherwise, CN II-XII intact.  Tone normal.  Reduced finger-thumb tapping speed and amplitude.  Muscle strength 5/5 throughout.  No tremor.  Sensation to light touch intact.  Deep tendon reflexes 2+ throughout.  In wheelchair.  Stands up and ambulates only with assistance.  Upright posture and broad-based gait.    Shon Millet, DO  CC: Cheryll Cockayne, MD

## 2023-06-21 ENCOUNTER — Encounter: Payer: Self-pay | Admitting: Neurology

## 2023-06-21 ENCOUNTER — Ambulatory Visit: Payer: Medicare Other | Admitting: Neurology

## 2023-06-21 VITALS — BP 135/57 | HR 63 | Ht 64.0 in | Wt 142.0 lb

## 2023-06-21 DIAGNOSIS — F0670 Mild neurocognitive disorder due to known physiological condition without behavioral disturbance: Secondary | ICD-10-CM | POA: Diagnosis not present

## 2023-06-21 DIAGNOSIS — G231 Progressive supranuclear ophthalmoplegia [Steele-Richardson-Olszewski]: Secondary | ICD-10-CM | POA: Diagnosis not present

## 2023-06-21 DIAGNOSIS — F32A Depression, unspecified: Secondary | ICD-10-CM | POA: Diagnosis not present

## 2023-06-21 MED ORDER — DONEPEZIL HCL 10 MG PO TABS: ORAL_TABLET | ORAL | 0 refills | Status: DC

## 2023-06-21 NOTE — Patient Instructions (Signed)
Stop carbidopa-levodopa 2.  We will start donepezil (Aricept) 10mg  tablet - take 1/2 tablet at bedtime for 2 weeks, then increase to 1 tablet at bedtime  Side effects include nausea, vomiting, diarrhea, vivid dreams, and muscle cramps.  Please call the clinic if you experience any of these symptoms. 3.  Continue stretching/limb exercises 4.  Follow up 6 months.    Progressive Supranuclear Palsy Progressive supranuclear palsy is a rare brain disorder that causes problems with walking, eye movement, and balance. It may also cause depression, memory loss, and changes in behavior. Progressive supranuclear palsy happens when cells at the base of the brain gradually become damaged. The condition gets worse with time. There is no cure for this condition. What are the causes? The cause of this condition is not known. In some cases, it may be caused by genes passed down through families. What increases the risk? You are more likely to develop this condition if: You are older than 81 years of age. You are female. What are the signs or symptoms? Symptoms of this condition include: Unexplained falls, or loss of balance while walking. Walking that is stiff and awkward. Slow movements and stiff muscles. Difficulty with eye movements, or spontaneous eye closing. Problems with memory and thinking that get worse with time (progressive dementia). Trouble swallowing and speaking. Changes in mood and behavior, such as: Lack of feeling or emotion (apathy). Irritability. Sudden laughing or crying. Personality changes. How is this diagnosed? This condition is diagnosed based on your symptoms, especially: Poor balance. Problems with eye movement. Speech changes. Mental or behavioral changes. You may also have tests to help rule out other causes of your symptoms. These include: Brain imaging tests such as an MRI or a CT scan. A barium swallow study. In this study, X-rays of your digestive system are taken  after you swallow the chemical barium. Barium makes the images clear. Memory tests, also called neuropsychological testing. How is this treated? There is no cure for this condition, but treatment can help with symptoms. Treatment may include: Medicines for Parkinson's disease. These may help with slowness, stiffness, and balance problems. Antidepressant medicines to treat depression. Walking aids, such as a walker, to prevent falls. Glasses with prisms to help with vision. A surgical procedure to place a feeding tube into your stomach (gastrostomy) if swallowing becomes very hard. Treatment involves a team of health care providers. The team may include: A physical therapist. This person can help you identify a safe exercise program. A speech and language therapist. This person can teach you ways to swallow foods and liquids safely and how to speak more clearly. An occupational therapist. This person can teach you how to use walking aids, and ways to make your home safe. Follow these instructions at home: Take over-the-counter and prescription medicines only as told by your health care provider. Use walking aids and glasses with prisms as told by your health care provider. Work closely with your health care team and follow their instructions. Keep all follow-up visits. This is important. Where to find more information [facility] website: TelephoneAffiliates.pl [facility]: BasicFM.no Contact a health care provider if: You have chills or a fever. Your symptoms are getting worse. You develop new symptoms. You are losing weight. You are choking while eating. You have a cough that will not go away. You have shortness of breath. You are anxious or depressed. You are not getting enough support at home. Get help right away if: You  have any of these problems after eating or drinking: Choking. Coughing. Trouble breathing. You fall and hurt yourself. You no  longer feel safe at home. These symptoms may represent a serious problem that is an emergency. Do not wait to see if the symptoms will go away. Get medical help right away. Call your local emergency services (911 in the [place] Do not drive yourself to the hospital. Summary Progressive supranuclear palsy is a rare brain disorder that causes problems with walking, eye movement, and balance. It may also cause depression, memory loss, and changes in behavior. This condition is diagnosed based on your symptoms. There is no cure for this condition, but treatment can help relieve your symptoms. Treatment for this condition may include medicines that are used to treat Parkinson's disease and medicines that are used to treat other symptoms like depression. Other treatments involve various therapies that help with daily living. This information is not intended to replace advice given to you by your health care provider. Make sure you discuss any questions you have with your health care provider. Document Revised: 07/17/2020 Document Reviewed: 07/17/2020 Elsevier Patient Education  2024 [employer].

## 2023-06-26 ENCOUNTER — Encounter: Payer: Self-pay | Admitting: Neurology

## 2023-06-27 ENCOUNTER — Other Ambulatory Visit: Payer: Self-pay | Admitting: Neurology

## 2023-07-24 ENCOUNTER — Other Ambulatory Visit: Payer: Self-pay | Admitting: Neurology

## 2023-07-24 NOTE — Telephone Encounter (Signed)
Patient will continue waiting on restarting the Donepezil after this weekend but patient will need a refill on the Lexapro.

## 2023-08-01 NOTE — Patient Instructions (Addendum)
Blood work was ordered.   The lab is on the first floor.    Medications changes include :   none     Return in about 6 months (around 01/30/2024) for follow up.    Health Maintenance, Female Adopting a healthy lifestyle and getting preventive care are important in promoting health and wellness. Ask your health care provider about: The right schedule for you to have regular tests and exams. Things you can do on your own to prevent diseases and keep yourself healthy. What should I know about diet, weight, and exercise? Eat a healthy diet  Eat a diet that includes plenty of vegetables, fruits, low-fat dairy products, and lean protein. Do not eat a lot of foods that are high in solid fats, added sugars, or sodium. Maintain a healthy weight Body mass index (BMI) is used to identify weight problems. It estimates body fat based on height and weight. Your health care provider can help determine your BMI and help you achieve or maintain a healthy weight. Get regular exercise Get regular exercise. This is one of the most important things you can do for your health. Most adults should: Exercise for at least 150 minutes each week. The exercise should increase your heart rate and make you sweat (moderate-intensity exercise). Do strengthening exercises at least twice a week. This is in addition to the moderate-intensity exercise. Spend less time sitting. Even light physical activity can be beneficial. Watch cholesterol and blood lipids Have your blood tested for lipids and cholesterol at 81 years of age, then have this test every 5 years. Have your cholesterol levels checked more often if: Your lipid or cholesterol levels are high. You are older than 81 years of age. You are at high risk for heart disease. What should I know about cancer screening? Depending on your health history and family history, you may need to have cancer screening at various ages. This may include screening  for: Breast cancer. Cervical cancer. Colorectal cancer. Skin cancer. Lung cancer. What should I know about heart disease, diabetes, and high blood pressure? Blood pressure and heart disease High blood pressure causes heart disease and increases the risk of stroke. This is more likely to develop in people who have high blood pressure readings or are overweight. Have your blood pressure checked: Every 3-5 years if you are 13-8 years of age. Every year if you are 80 years old or older. Diabetes Have regular diabetes screenings. This checks your fasting blood sugar level. Have the screening done: Once every three years after age 69 if you are at a normal weight and have a low risk for diabetes. More often and at a younger age if you are overweight or have a high risk for diabetes. What should I know about preventing infection? Hepatitis B If you have a higher risk for hepatitis B, you should be screened for this virus. Talk with your health care provider to find out if you are at risk for hepatitis B infection. Hepatitis C Testing is recommended for: Everyone born from [birth year] through 1964. Anyone with known risk factors for hepatitis C. Sexually transmitted infections (STIs) Get screened for STIs, including gonorrhea and chlamydia, if: You are sexually active and are younger than 81 years of age. You are older than 81 years of age and your health care provider tells you that you are at risk for this type of infection. Your sexual activity has changed since you were last screened, and you are  at increased risk for chlamydia or gonorrhea. Ask your health care provider if you are at risk. Ask your health care provider about whether you are at high risk for HIV. Your health care provider may recommend a prescription medicine to help prevent HIV infection. If you choose to take medicine to prevent HIV, you should first get tested for HIV. You should then be tested every 3 months for as long as you  are taking the medicine. Pregnancy If you are about to stop having your period (premenopausal) and you may become pregnant, seek counseling before you get pregnant. Take 400 to 800 micrograms (mcg) of folic acid every day if you become pregnant. Ask for birth control (contraception) if you want to prevent pregnancy. Osteoporosis and menopause Osteoporosis is a disease in which the bones lose minerals and strength with aging. This can result in bone fractures. If you are 40 years old or older, or if you are at risk for osteoporosis and fractures, ask your health care provider if you should: Be screened for bone loss. Take a calcium or vitamin D supplement to lower your risk of fractures. Be given hormone replacement therapy (HRT) to treat symptoms of menopause. Follow these instructions at home: Alcohol use Do not drink alcohol if: Your health care provider tells you not to drink. You are pregnant, may be pregnant, or are planning to become pregnant. If you drink alcohol: Limit how much you have to: 0-1 drink a day. Know how much alcohol is in your drink. In the [place] one drink equals one 12 oz bottle of beer (355 mL), one 5 oz glass of wine (148 mL), or one 1 oz glass of hard liquor (44 mL). Lifestyle Do not use any products that contain nicotine or tobacco. These products include cigarettes, chewing tobacco, and vaping devices, such as e-cigarettes. If you need help quitting, ask your health care provider. Do not use street drugs. Do not share needles. Ask your health care provider for help if you need support or information about quitting drugs. General instructions Schedule regular health, dental, and eye exams. Stay current with your vaccines. Tell your health care provider if: You often feel depressed. You have ever been abused or do not feel safe at home. Summary Adopting a healthy lifestyle and getting preventive care are important in promoting health and wellness. Follow your  health care provider's instructions about healthy diet, exercising, and getting tested or screened for diseases. Follow your health care provider's instructions on monitoring your cholesterol and blood pressure. This information is not intended to replace advice given to you by your health care provider. Make sure you discuss any questions you have with your health care provider. Document Revised: 01/24/2021 Document Reviewed: 01/24/2021 Elsevier Patient Education  2024 [employer].

## 2023-08-01 NOTE — Progress Notes (Unsigned)
Subjective:    Patient ID: [name], female    DOB: [birth year], 82 y.o.   MRN: [id number]      HPI [name] is here for a Physical exam and her chronic medical problems.      Medications and allergies reviewed with patient and updated if appropriate.  Current Outpatient Medications on File Prior to Visit  Medication Sig Dispense Refill   amLODipine (NORVASC) 5 MG tablet TAKE 1 TABLET BY MOUTH EVERY DAY FOR HYPERTENSION 30 tablet 4   aspirin 81 MG chewable tablet Chew 1 tablet (81 mg total) by mouth daily. 30 tablet 0   carvedilol (COREG) 25 MG tablet TAKE 1 TABLET BY MOUTH 2 TIMES A DAY FOR HYPERTENSION 60 tablet 4   donepezil (ARICEPT) 10 MG tablet TAKE ONE TABLET BY MOUTH EVERY DAY AT BEDTIME FOR DEMENTIA -START 07/06/23- 30 tablet 0   escitalopram (LEXAPRO) 10 MG tablet TAKE 1 TABLET BY MOUTH EVERY DAY FOR MOOD 30 tablet 0   ipratropium-albuterol (DUONEB) 0.5-2.5 (3) MG/3ML SOLN Administer 3 mL by nebulization every 4 hours as needed for wheezing. 126 mL 0   metFORMIN (GLUCOPHAGE-XR) 750 MG 24 hr tablet Take 1 tablet (750 mg total) by mouth daily with supper. 90 tablet 3   No current facility-administered medications on file prior to visit.    Review of Systems     Objective:  There were no vitals filed for this visit. There were no vitals filed for this visit. There is no height or weight on file to calculate BMI.  BP Readings from Last 3 Encounters:  06/21/23 (!) 135/57  02/01/23 114/78  01/08/23 129/80    Wt Readings from Last 3 Encounters:  06/21/23 142 lb (64.4 kg)  01/07/23 140 lb (63.5 kg)  12/20/22 143 lb 6.4 oz (65 kg)       Physical Exam Constitutional: She appears well-developed and well-nourished. No distress.  HENT:  Head: Normocephalic and atraumatic.  Right Ear: External ear normal. Normal ear canal and TM Left Ear: External ear normal.  Normal ear canal and TM Mouth/Throat: Oropharynx is clear and moist.  Eyes: Conjunctivae  normal.  Neck: Neck supple. No tracheal deviation present. No thyromegaly present.  No carotid bruit  Cardiovascular: Normal rate, regular rhythm and normal heart sounds.   No murmur heard.  No edema. Pulmonary/Chest: Effort normal and breath sounds normal. No respiratory distress. She has no wheezes. She has no rales.  Breast: deferred   Abdominal: Soft. She exhibits no distension. There is no tenderness.  Lymphadenopathy: She has no cervical adenopathy.  Skin: Skin is warm and dry. She is not diaphoretic.  Psychiatric: She has a normal mood and affect. Her behavior is normal.     Lab Results  Component Value Date   WBC 5.5 02/01/2023   HGB 12.8 02/01/2023   HCT 37.9 02/01/2023   PLT 178.0 02/01/2023   GLUCOSE 116 (H) 02/01/2023   CHOL 135 02/01/2023   TRIG 136.0 02/01/2023   HDL 38.00 (L) 02/01/2023   LDLDIRECT 108.0 08/15/2017   LDLCALC 70 02/01/2023   ALT 4 02/01/2023   AST 18 02/01/2023   NA 141 02/01/2023   K 4.1 02/01/2023   CL 105 02/01/2023   CREATININE 0.95 02/01/2023   BUN 14 02/01/2023   CO2 28 02/01/2023   TSH 2.37 07/31/2022   INR 1.1 (H) 04/27/2020   HGBA1C 5.3 02/01/2023   MICROALBUR 3.6 (H) 07/31/2022         Assessment &  Plan:   Physical exam: Screening blood work  ordered Exercise   Weight   Substance abuse  none   Reviewed recommended immunizations.   Health Maintenance  Topic Date Due   Zoster Vaccines- Shingrix (1 of 2) 09/30/1959   FOOT EXAM  02/24/2020   DEXA SCAN  03/20/2020   INFLUENZA VACCINE  04/19/2023   COVID-19 Vaccine (6 - 2023-24 season) 05/20/2023   HEMOGLOBIN A1C  08/04/2023   OPHTHALMOLOGY EXAM  01/29/2024   DTaP/Tdap/Td (2 - Td or Tdap) 11/13/2032   Pneumonia Vaccine 52+ Years old  Completed   HPV VACCINES  Aged Out   Hepatitis C Screening  Discontinued          See Problem List for Assessment and Plan of chronic medical problems.

## 2023-08-02 ENCOUNTER — Encounter: Payer: Self-pay | Admitting: Internal Medicine

## 2023-08-02 ENCOUNTER — Encounter: Payer: Medicare Other | Admitting: Internal Medicine

## 2023-08-02 ENCOUNTER — Ambulatory Visit (INDEPENDENT_AMBULATORY_CARE_PROVIDER_SITE_OTHER): Payer: Medicare Other | Admitting: Internal Medicine

## 2023-08-02 ENCOUNTER — Ambulatory Visit: Payer: Medicare Other

## 2023-08-02 VITALS — BP 120/68 | HR 56 | Ht 63.0 in | Wt 149.0 lb

## 2023-08-02 VITALS — BP 120/68 | HR 56 | Temp 98.0°F | Ht 63.0 in | Wt 149.0 lb

## 2023-08-02 DIAGNOSIS — M8580 Other specified disorders of bone density and structure, unspecified site: Secondary | ICD-10-CM | POA: Diagnosis not present

## 2023-08-02 DIAGNOSIS — I480 Paroxysmal atrial fibrillation: Secondary | ICD-10-CM

## 2023-08-02 DIAGNOSIS — E1122 Type 2 diabetes mellitus with diabetic chronic kidney disease: Secondary | ICD-10-CM

## 2023-08-02 DIAGNOSIS — R600 Localized edema: Secondary | ICD-10-CM

## 2023-08-02 DIAGNOSIS — Z0000 Encounter for general adult medical examination without abnormal findings: Secondary | ICD-10-CM

## 2023-08-02 DIAGNOSIS — E781 Pure hyperglyceridemia: Secondary | ICD-10-CM | POA: Diagnosis not present

## 2023-08-02 DIAGNOSIS — N1831 Chronic kidney disease, stage 3a: Secondary | ICD-10-CM

## 2023-08-02 DIAGNOSIS — G20A1 Parkinson's disease without dyskinesia, without mention of fluctuations: Secondary | ICD-10-CM | POA: Diagnosis not present

## 2023-08-02 DIAGNOSIS — I10 Essential (primary) hypertension: Secondary | ICD-10-CM | POA: Diagnosis not present

## 2023-08-02 DIAGNOSIS — F3289 Other specified depressive episodes: Secondary | ICD-10-CM

## 2023-08-02 DIAGNOSIS — Z0001 Encounter for general adult medical examination with abnormal findings: Secondary | ICD-10-CM

## 2023-08-02 LAB — TSH: TSH: 3.23 u[IU]/mL (ref 0.35–5.50)

## 2023-08-02 LAB — COMPREHENSIVE METABOLIC PANEL
ALT: 26 U/L (ref 0–35)
AST: 25 U/L (ref 0–37)
Albumin: 3.9 g/dL (ref 3.5–5.2)
Alkaline Phosphatase: 62 U/L (ref 39–117)
BUN: 28 mg/dL — ABNORMAL HIGH (ref 6–23)
CO2: 30 meq/L (ref 19–32)
Calcium: 9.3 mg/dL (ref 8.4–10.5)
Chloride: 108 meq/L (ref 96–112)
Creatinine, Ser: 0.95 mg/dL (ref 0.40–1.20)
GFR: 56.06 mL/min — ABNORMAL LOW (ref >60.00)
Glucose, Bld: 112 mg/dL — ABNORMAL HIGH (ref 70–99)
Potassium: 4.5 meq/L (ref 3.5–5.1)
Sodium: 143 meq/L (ref 135–145)
Total Bilirubin: 0.9 mg/dL (ref 0.2–1.2)
Total Protein: 6.7 g/dL (ref 6.0–8.3)

## 2023-08-02 LAB — CBC WITH DIFFERENTIAL/PLATELET
Basophils Absolute: 0 10*3/uL (ref 0.0–0.1)
Basophils Relative: 0.3 % (ref 0.0–3.0)
Eosinophils Absolute: 0.2 10*3/uL (ref 0.0–0.7)
Eosinophils Relative: 3.5 % (ref 0.0–5.0)
HCT: 38.9 % (ref 36.0–46.0)
Hemoglobin: 12.8 g/dL (ref 12.0–15.0)
Lymphocytes Relative: 22.6 % (ref 12.0–46.0)
Lymphs Abs: 1.3 10*3/uL (ref 0.7–4.0)
MCHC: 32.8 g/dL (ref 30.0–36.0)
MCV: 91.4 fL (ref 78.0–100.0)
Monocytes Absolute: 0.6 10*3/uL (ref 0.1–1.0)
Monocytes Relative: 10.1 % (ref 3.0–12.0)
Neutro Abs: 3.7 10*3/uL (ref 1.4–7.7)
Neutrophils Relative %: 63.5 % (ref 43.0–77.0)
Platelets: 166 10*3/uL (ref 150.0–400.0)
RBC: 4.26 Mil/uL (ref 3.87–5.11)
RDW: 14.7 % (ref 11.5–15.5)
WBC: 5.8 10*3/uL (ref 4.0–10.5)

## 2023-08-02 LAB — LIPID PANEL
Cholesterol: 132 mg/dL (ref 0–200)
HDL: 48.9 mg/dL (ref >39.00)
LDL Cholesterol: 54 mg/dL (ref 0–99)
NonHDL: 83.1
Total CHOL/HDL Ratio: 3
Triglycerides: 145 mg/dL (ref 0.0–149.0)
VLDL: 29 mg/dL (ref 0.0–40.0)

## 2023-08-02 LAB — HEMOGLOBIN A1C: Hgb A1c MFr Bld: 5.7 % (ref 4.6–6.5)

## 2023-08-02 NOTE — Assessment & Plan Note (Addendum)
Chronic Blood pressure well-controlled  -has occasional lightheadedness-discussed this could be related to drop in BP when she first stands Her blood pressure is monitored closely where she lives and typically is higher than here cmp, CBC Continue amlodipine 5 mg daily, Coreg 25 mg twice daily

## 2023-08-02 NOTE — Assessment & Plan Note (Signed)
Chronic Check lipid panel Currently diet controlled Healthy diet, regular exercise encouraged

## 2023-08-02 NOTE — Patient Instructions (Signed)
[name] , Thank you for taking time to come for your Medicare Wellness Visit. I appreciate your ongoing commitment to your health goals. Please review the following plan we discussed and let me know if I can assist you in the future.   Referrals/Orders/Follow-Ups/Clinician Recommendations: You are due for a foot examination today.  You are also due for a Bone Density screening, which you will discuss that with Dr. Lawerance Bach during your visit today.    This is a list of the screening recommended for you and due dates:  Health Maintenance  Topic Date Due   Zoster (Shingles) Vaccine (1 of 2) 09/30/1959   Complete foot exam   02/24/2020   DEXA scan (bone density measurement)  03/20/2020   Flu Shot  04/19/2023   COVID-19 Vaccine (6 - 2023-24 season) 05/20/2023   Hemoglobin A1C  08/04/2023   Eye exam for diabetics  01/29/2024   DTaP/Tdap/Td vaccine (2 - Td or Tdap) 11/13/2032   Pneumonia Vaccine  Completed   HPV Vaccine  Aged Out   Hepatitis C Screening  Discontinued    Advanced directives: (In Chart) A copy of your advanced directives are scanned into your chart should your provider ever need it.  Next Medicare Annual Wellness Visit scheduled for next year: Yes

## 2023-08-02 NOTE — Assessment & Plan Note (Signed)
Chronic Has been stable CMP, CBC 

## 2023-08-02 NOTE — Assessment & Plan Note (Addendum)
Chronic Following with cardiology High risk of falls-not on anticoagulation On Coreg 25 mg twice daily and aspirin 81 mg daily

## 2023-08-02 NOTE — Assessment & Plan Note (Addendum)
Chronic DEXA due-deferred since she is on hospice Check vitamin D level Taking multivitamin-continue

## 2023-08-02 NOTE — Progress Notes (Signed)
Subjective:   [name] is a 82 y.o. female who presents for Medicare Annual (Subsequent) preventive examination.  Visit Complete: In person    Cardiac Risk Factors include: advanced age (>94men, >24 women);diabetes mellitus;hypertension;Other (see comment), Risk factor comments: A-Fib, Aortic atherosclerosis, Fatty liver, Osteopenia CKD     Objective:    Today's Vitals   08/02/23 1020  BP: 120/68  Pulse: (!) 56  SpO2: 97%  Weight: 149 lb (67.6 kg)  Height: 5\' 3"  (1.6 m)   Body mass index is 26.39 kg/m.     08/02/2023   10:24 AM 06/21/2023   10:44 AM 01/07/2023   10:21 PM 11/24/2022    6:36 PM 11/13/2022   10:32 AM 07/17/2022   10:46 AM 01/16/2022   10:28 AM  Advanced Directives  Does Patient Have a Medical Advance Directive? Yes Yes Yes No;Yes No Yes Yes  Type of Estate agent of [place];Living will Healthcare Power of [place];Living will Out of facility DNR (pink MOST or yellow form) Out of facility DNR (pink MOST or yellow form)  Healthcare Power of [place];Living will Healthcare Power of [place];Living will  Does patient want to make changes to medical advance directive? No - Patient declined Yes (Inpatient - patient defers changing a medical advance directive and declines information at this time)       Copy of Healthcare Power of Attorney in Chart? Yes - validated most recent copy scanned in chart (See row information)        Would patient like information on creating a medical advance directive?   No - Patient declined  No - Patient declined      Current Medications (verified) Outpatient Encounter Medications as of 08/02/2023  Medication Sig   amLODipine (NORVASC) 5 MG tablet TAKE 1 TABLET BY MOUTH EVERY DAY FOR HYPERTENSION   aspirin 81 MG chewable tablet Chew 1 tablet (81 mg total) by mouth daily.   carvedilol (COREG) 25 MG tablet TAKE 1 TABLET BY MOUTH 2 TIMES A DAY FOR HYPERTENSION   donepezil (ARICEPT) 10 MG tablet TAKE ONE  TABLET BY MOUTH EVERY DAY AT BEDTIME FOR DEMENTIA -START 07/06/23-   escitalopram (LEXAPRO) 10 MG tablet TAKE 1 TABLET BY MOUTH EVERY DAY FOR MOOD   ipratropium-albuterol (DUONEB) 0.5-2.5 (3) MG/3ML SOLN Administer 3 mL by nebulization every 4 hours as needed for wheezing.   metFORMIN (GLUCOPHAGE-XR) 750 MG 24 hr tablet Take 1 tablet (750 mg total) by mouth daily with supper. (Patient not taking: Reported on 08/02/2023)   No facility-administered encounter medications on file as of 08/02/2023.    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 She underwent right total mastectomy with sentinel node biopsy and left total (prophylactic) mastectomy on 10/12/2010. She underwent immediate reconstruction with expander and AlloDerm placement. 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 One lymph   Complicated UTI (urinary tract infection) 01/31/2020   Diabetes mellitus    Dizziness 02/24/2019   Elevated LFTs 08/10/2014   Episodic lightheadedness 08/27/2019   Esophageal reflux 05/06/2008   Essential hypertension 02/16/2009   Family history of  breast cancer    Family history of ovarian cancer    Fatigue 02/24/2019   Fatty liver 08/05/2015   Genetic testing 09/04/2017   Negative genetic testing on the common hereditary cancer panel.  The Hereditary Gene Panel offered by [facility] includes sequencing and/or deletion duplication testing of the following 47 genes: APC, ATM, AXIN2, BARD1, BMPR1A, BRCA1, BRCA2, BRIP1, CDH1, CDK4, CDKN2A (p14ARF), CDKN2A (p16INK4a), CHEK2,  CTNNA1, DICER1, EPCAM (Deletion/duplication testing only), GREM1 (promoter region deletion/duplicat   GERD (gastroesophageal reflux disease)    Sullivan Lone syndrome    GILBERT'S SYNDROME 02/06/2007   Qualifier: Diagnosis of  By: Wendall Stade     History of breast cancer 08/04/2015   S/p b/l mastectomy   History of colonic polyps 06/06/2010   Annotation: destroyed, no clear adenomatous proven Qualifier: Diagnosis of  By: Leone Payor MD, Alfonse Ras E  2008 polyp 2013 neg     Hypertension    Hypertriglyceridemia 09/30/2007   statin intolerant  Father MI @ 56 Sister CVA > 65    Hypokalemia due to inadequate potassium intake 01/31/2020   IBS (irritable bowel syndrome)    Joint pain 09/02/2010   Lactic acidosis 01/31/2020   Osteoarthritis of left knee 08/04/2015   Significant arthritis in knees, limits her activity    Osteopenia 08/31/2013   [facility]  DEXA 03/20/2018: Osteopenia:  LFN -1.3, R--1.0, spine -0.6-statistically significant increase in BMD in all areas  Findings 08/22/13 : lowest T score -  1.3  @  R femoral neck (hip) ,3.4  %loss @ R femur &   4.1 % in spine   since  2012 Dexa 01/05/16: lowest  - L femur neck T -1.6 Diagnosis: mild Osteopenia Rx: Fosamax remotely; now on Arimidex    Parkinsonian features 01/31/2020   Peripheral neuropathy 08/23/2018   Plantar fasciitis of right foot    Dr Jaquita Rector   Poor balance 02/20/2018   S/P bilateral mastectomy 08/04/2015   Septic shock (HCC) 01/31/2020   Severe sepsis with acute organ dysfunction due to gram-negative bacteria (HCC) 01/31/2020   Transfusion history 1976   Type 2 diabetes mellitus with stage 3 chronic kidney disease, without long-term current use of insulin (HCC) 08/11/2008   Ophth, Dr Dagoberto Ligas: no retinopathy  Diabetes maternal grandmother   Past Surgical History:  Procedure Laterality Date   ABDOMINAL HYSTERECTOMY  1976   with BSO due to infection from [place] IUD   APPENDECTOMY  1976   @ [facility] & BSO   BREAST LUMPECTOMY      [id number], [phone]   CATARACT EXTRACTION, BILATERAL Bilateral 2018   COLONOSCOPY W/ POLYPECTOMY  2006   negative 2011; Dr Leone Payor   MASTECTOMY W/ NODES PARTIAL  2012   double mastectomy with nodes taken out on right side    PLACEMENT OF BREAST IMPLANTS  04/2011   Dr Kelly Splinter, [facility]   TONSILLECTOMY AND ADENOIDECTOMY     Family History  Problem Relation Age of Onset   Heart attack Father 42   Breast cancer Sister 104        bilateral    Heart failure Sister        PMH intensive chemotherapy   Hypertension Sister    Stroke Sister 46   Stroke Mother        TMI   Breast cancer Maternal Aunt 40   Diabetes Maternal Grandmother    Ovarian cancer Paternal Grandmother    Breast cancer Maternal Aunt 77   Colon cancer Neg Hx    Stomach cancer  Neg Hx    Esophageal cancer Neg Hx    Pancreatic cancer Neg Hx    Liver disease Neg Hx    Social History   Socioeconomic History   Marital status: Married    Spouse name: Not on file   Number of children: 4   Years of education: Not on file   Highest education level: Not on file  Occupational History   Occupation: retired  Tobacco Use   Smoking status: Former    Current packs/day: 0.00    Types: Cigarettes    Quit date: 09/18/1977    Years since quitting: 45.9   Smokeless tobacco: Never   Tobacco comments:    smoked 1957-1979 , up to 1 ppd  Vaping Use   Vaping status: Never Used  Substance and Sexual Activity   Alcohol use: No    Comment:  very rarely   Drug use: No   Sexual activity: Not Currently    Birth control/protection: Surgical  Other Topics Concern   Not on file  Social History Narrative   Pt live at [facility] living   Social Determinants of Health   Financial Resource Strain: Low Risk  (08/02/2023)   Overall Financial Resource Strain (CARDIA)    Difficulty of Paying Living Expenses: Not hard at all  Food Insecurity: No Food Insecurity (08/02/2023)   Hunger Vital Sign    Worried About Running Out  of Food in the Last Year: Never true    Ran Out of Food in the Last Year: Never true  Transportation Needs: No Transportation Needs (08/02/2023)   PRAPARE - Administrator, Civil Service (Medical): No    Lack of Transportation (Non-Medical): No  Physical Activity: Inactive (08/02/2023)   Exercise Vital Sign    Days of Exercise per Week: 0 days    Minutes of Exercise per Session: 0 min  Stress: No Stress Concern Present (08/02/2023)   [employer] of Occupational Health - Occupational Stress Questionnaire    Feeling of Stress : Not at all  Social Connections: Moderately Isolated (08/02/2023)   Social Connection and Isolation Panel [NHANES]    Frequency of Communication with Friends and Family: More than three times a week    Frequency of Social Gatherings with Friends and Family: More than three times a week    Attends Religious Services: Never    Database administrator or Organizations: No    Attends Banker Meetings: Never    Marital Status: Married    Tobacco Counseling Counseling given: Not Answered Tobacco comments: smoked [phone] , up to 1 ppd   Clinical Intake:  Pre-visit preparation completed: Yes  Pain : No/denies pain     BMI - recorded: 26.39 Nutritional Risks: Nausea/ vomitting/ diarrhea (Diarrhea) Diabetes: Yes CBG done?: No Did pt. bring in CBG monitor from home?: No  How often do you need to have someone help you when you read instructions, pamphlets, or other written materials from your doctor or pharmacy?: 3 - Sometimes  Interpreter Needed?: No  Information entered by :: Alize Borrayo, RMA   Activities of Daily Living    08/02/2023   10:22 AM  In your present state of health, do you have any difficulty performing the following activities:  Hearing? 0  Vision? 0  Difficulty concentrating or making decisions? 0  Walking or climbing stairs? 1  Dressing or bathing? 1  Doing errands, shopping? 0  Comment husband  takes her around  Preparing Food and eating ?  Y  Comment Pt live at [facility] living  Using the Toilet? Y  In the past six months, have you accidently leaked urine? Y  Do you have problems with loss of bowel control? Y  Managing your Medications? Y  Managing your Finances? Y  Housekeeping or managing your Housekeeping? Y    Patient Care Team: Pincus Sanes, MD as PCP - General (Internal Medicine) Jake Bathe, MD as PCP - Cardiology (Cardiology) Drema Dallas, DO as Consulting Physician (Neurology) Mckinley Jewel, MD as Consulting Physician (Ophthalmology) Kathyrn Sheriff, [facility] (Inactive) as Pharmacist (Pharmacist)  Indicate any recent Medical Services you may have received from other than [facility] providers in the past year (date may be approximate).     Assessment:   This is a routine wellness examination for [name].  Hearing/Vision screen Hearing Screening - Comments:: Denies hearing difficulties   Vision Screening - Comments:: Denies vision issues   Goals Addressed   None   Depression Screen    08/02/2023   10:14 AM 04/30/2023    3:46 PM 02/26/2023   10:19 AM 02/01/2023   11:18 AM 01/22/2023    3:39 PM 11/15/2022   12:15 PM 10/31/2022    4:09 PM  PHQ 2/9 Scores  PHQ - 2 Score 0 2 2 2 2 2 2   PHQ- 9 Score 3 6 6 3 8 7 7     Fall Risk    08/02/2023   10:24 AM 06/21/2023   10:44 AM 02/01/2023   11:18 AM 12/20/2022   10:38 AM 08/29/2022    1:58 PM  Fall Risk   Falls in the past year? 1 0 0 0 1  Number falls in past yr: 1 0 0 0 1  Injury with Fall? 1 0 0 0 1  Risk for fall due to : Impaired mobility  No Fall Risks  History of fall(s)  Follow up Falls evaluation completed;Falls prevention discussed Falls evaluation completed Falls evaluation completed  Falls evaluation completed    MEDICARE RISK AT HOME: Medicare Risk at Home Any stairs in or around the home?: No (Pt live at [facility] [employer] [facility] living) Home free of loose throw rugs in walkways, pet beds,  electrical cords, etc?: Yes Adequate lighting in your home to reduce risk of falls?: Yes Life alert?: Yes Use of a cane, walker or w/c?: Yes (wheel chair) Grab bars in the bathroom?: Yes Shower chair or bench in shower?: Yes Elevated toilet seat or a handicapped toilet?: Yes  TIMED UP AND GO:  Was the test performed?  Yes  Length of time to ambulate 10 feet: 30 sec Gait slow and steady with assistive device    Cognitive Function:    02/20/2018   10:47 AM  MMSE - Mini Mental State Exam  Orientation to time 5  Orientation to Place 5  Registration 3  Attention/ Calculation 5  Recall 3  Language- name 2 objects 2  Language- repeat 1  Language- follow 3 step command 3  Language- read & follow direction 1  Write a sentence 1  Copy design 1  Total score 30        08/02/2023   10:11 AM  6CIT Screen  What Year? 4 points  What month? 0 points  What time? 0 points  Count back from 20 0 points  Months in reverse 0 points  Repeat phrase 0 points  Total Score 4 points    Immunizations Immunization History  Administered Date(s) Administered   Fluad Quad(high  Dose 65+) 06/28/2019, 07/17/2020, 06/25/2021, 07/17/2022   H1N1 09/15/2008   Influenza Split 07/24/2011   Influenza Whole 07/29/2008, 08/06/2009, 08/19/2010   Influenza, High Dose Seasonal PF 08/18/2013, 06/18/2015, 07/24/2016, 07/10/2017, 07/24/2018   Influenza, Seasonal, Injecte, Preservative Fre 08/14/2012   Influenza,inj,Quad PF,6+ Mos 05/28/2014   PFIZER Comirnaty(Gray Top)Covid-19 Tri-Sucrose Vaccine 07/17/2022   PFIZER(Purple Top)SARS-COV-2 Vaccination 10/10/2019, 10/31/2019, 07/31/2020   PPD Test 09/25/2022   Pfizer Covid-19 Vaccine Bivalent Booster 11yrs & up 07/12/2021   Pfizer(Comirnaty)Fall Seasonal Vaccine 12 years and older 07/17/2022   Pneumococcal Conjugate-13 09/07/2014   Pneumococcal Polysaccharide-23 10/05/2011   Tdap 11/13/2022   Tetanus 02/26/2014   Zoster, Live 08/19/2011    TDAP status: Up  to date  Flu Vaccine status: Due, Education has been provided regarding the importance of this vaccine. Advised may receive this vaccine at local pharmacy or Health Dept. Aware to provide a copy of the vaccination record if obtained from local pharmacy or Health Dept. Verbalized acceptance and understanding.  Pneumococcal vaccine status: Up to date  Covid-19 vaccine status: Information provided on how to obtain vaccines.   Qualifies for Shingles Vaccine? Yes   Zostavax completed Yes   Shingrix Completed?: No.    Education has been provided regarding the importance of this vaccine. Patient has been advised to call insurance company to determine out of pocket expense if they have not yet received this vaccine. Advised may also receive vaccine at local pharmacy or Health Dept. Verbalized acceptance and understanding.  Screening Tests Health Maintenance  Topic Date Due   FOOT EXAM  08/02/2023 (Originally 02/24/2020)   DEXA SCAN  09/18/2023 (Originally 03/20/2020)   COVID-19 Vaccine (6 - 2023-24 season) 09/18/2023 (Originally 05/20/2023)   Zoster Vaccines- Shingrix (1 of 2) 11/02/2023 (Originally 09/30/1959)   INFLUENZA VACCINE  12/17/2023 (Originally 04/19/2023)   HEMOGLOBIN A1C  08/04/2023   OPHTHALMOLOGY EXAM  01/29/2024   DTaP/Tdap/Td (2 - Td or Tdap) 11/13/2032   Pneumonia Vaccine 16+ Years old  Completed   HPV VACCINES  Aged Out   Hepatitis C Screening  Discontinued    Health Maintenance  There are no preventive care reminders to display for this patient.   Colorectal cancer screening: No longer required.   Mammogram status: No longer required due to age.  Bone Density status: Completed 03/20/2018. Results reflect: Bone density results: OSTEOPENIA. Repeat every 2 years.  Lung Cancer Screening: (Low Dose CT Chest recommended if Age 66-80 years, 20 pack-year currently smoking OR have quit w/in 15years.) does not qualify.   Lung Cancer Screening Referral: N/A  Additional  Screening:  Hepatitis C Screening: does qualify; Completed 08/19/2013  Vision Screening: Recommended annual ophthalmology exams for early detection of glaucoma and other disorders of the eye. Is the patient up to date with their annual eye exam?  Yes  Who is the provider or what is the name of the office in which the patient attends annual eye exams? Dr. Cathey Endow If pt is not established with a provider, would they like to be referred to a provider to establish care? No .   Dental Screening: Recommended annual dental exams for proper oral hygiene  Diabetic Foot Exam: Diabetic Foot Exam: Overdue, Pt has been advised about the importance in completing this exam. Pt is scheduled for diabetic foot exam on 08/02/2023.  Community Resource Referral / Chronic Care Management: CRR required this visit?  No   CCM required this visit?  No     Plan:     I have personally reviewed and  noted the following in the patient's chart:   Medical and social history Use of alcohol, tobacco or illicit drugs  Current medications and supplements including opioid prescriptions. Patient is not currently taking opioid prescriptions. Functional ability and status Nutritional status Physical activity Advanced directives List of other physicians Hospitalizations, surgeries, and ER visits in previous 12 months Vitals Screenings to include cognitive, depression, and falls Referrals and appointments  In addition, I have reviewed and discussed with patient certain preventive protocols, quality metrics, and best practice recommendations. A written personalized care plan for preventive services as well as general preventive health recommendations were provided to patient.     [name], CMA   08/02/2023   After Visit Summary: (MyChart) Due to this being a telephonic visit, the after visit summary with patients personalized plan was offered to patient via MyChart   Nurse Notes: Patient is due for a Flu and  Covid vaccine, however she stated that she has had them both recently.  There is no documentation in [facility].  She is a resident at the [facility] living.  Patient is also with Hospice 3 times a week to get bathed.  She is currently wheelchair bound, as she is unsteady with balance.  Patient is due for a foot exam today.  She would like to discuss medication, Metformin and also her DEXA today during her visit.

## 2023-08-02 NOTE — Assessment & Plan Note (Signed)
New Mild-bilateral She is not aware of the swelling and it does not bother her No treatment needed just monitor

## 2023-08-02 NOTE — Assessment & Plan Note (Signed)
Chronic Management per neurology Not currently on any medication Doing PT

## 2023-08-02 NOTE — Assessment & Plan Note (Signed)
Chronic Managed by Dr. Everlena Cooper Continue Lexapro 10 mg daily

## 2023-08-02 NOTE — Assessment & Plan Note (Addendum)
Chronic Lab Results  Component Value Date   HGBA1C 5.3 02/01/2023   Sugars very well controlled Check A1c Metformin stopped due to diarrhea-I think she will be fine without it but she is eating more desserts so we will monitor

## 2023-08-11 ENCOUNTER — Encounter: Payer: Self-pay | Admitting: Neurology

## 2023-08-13 ENCOUNTER — Other Ambulatory Visit: Payer: Self-pay

## 2023-08-13 MED ORDER — DONEPEZIL HCL 10 MG PO TABS: ORAL_TABLET | ORAL | 0 refills | Status: AC

## 2023-08-13 NOTE — Progress Notes (Signed)
Script for Donepezil message: Stop medication until further noticed.

## 2023-09-05 ENCOUNTER — Encounter: Payer: Self-pay | Admitting: Neurology

## 2023-09-27 ENCOUNTER — Other Ambulatory Visit: Payer: Self-pay | Admitting: Neurology

## 2023-10-05 ENCOUNTER — Other Ambulatory Visit: Payer: Self-pay

## 2023-10-05 MED ORDER — ESCITALOPRAM OXALATE 10 MG PO TABS: 10.0000 mg | ORAL_TABLET | Freq: Every day | ORAL | 3 refills | Status: AC

## 2023-12-12 ENCOUNTER — Other Ambulatory Visit: Payer: Self-pay | Admitting: Internal Medicine

## 2023-12-16 ENCOUNTER — Encounter: Payer: Self-pay | Admitting: Internal Medicine

## 2023-12-21 ENCOUNTER — Ambulatory Visit: Payer: Medicare Other | Admitting: Neurology

## 2024-01-09 ENCOUNTER — Ambulatory Visit: Payer: Medicare Other | Admitting: Neurology

## 2024-01-29 DIAGNOSIS — I4891 Unspecified atrial fibrillation: Secondary | ICD-10-CM | POA: Diagnosis not present

## 2024-01-29 DIAGNOSIS — Z7189 Other specified counseling: Secondary | ICD-10-CM | POA: Diagnosis not present

## 2024-01-29 DIAGNOSIS — E079 Disorder of thyroid, unspecified: Secondary | ICD-10-CM | POA: Diagnosis not present

## 2024-01-29 DIAGNOSIS — N183 Chronic kidney disease, stage 3 unspecified: Secondary | ICD-10-CM | POA: Diagnosis not present

## 2024-01-29 DIAGNOSIS — Z515 Encounter for palliative care: Secondary | ICD-10-CM | POA: Diagnosis not present

## 2024-01-29 DIAGNOSIS — I700 Atherosclerosis of aorta: Secondary | ICD-10-CM | POA: Diagnosis not present

## 2024-01-29 DIAGNOSIS — Z853 Personal history of malignant neoplasm of breast: Secondary | ICD-10-CM | POA: Diagnosis not present

## 2024-01-29 DIAGNOSIS — Z9013 Acquired absence of bilateral breasts and nipples: Secondary | ICD-10-CM | POA: Diagnosis not present

## 2024-01-29 DIAGNOSIS — K589 Irritable bowel syndrome without diarrhea: Secondary | ICD-10-CM | POA: Diagnosis not present

## 2024-01-29 DIAGNOSIS — E1122 Type 2 diabetes mellitus with diabetic chronic kidney disease: Secondary | ICD-10-CM | POA: Diagnosis not present

## 2024-01-29 DIAGNOSIS — G20A1 Parkinson's disease without dyskinesia, without mention of fluctuations: Secondary | ICD-10-CM | POA: Diagnosis not present

## 2024-01-30 ENCOUNTER — Ambulatory Visit: Payer: Medicare Other | Admitting: Internal Medicine

## 2024-02-04 ENCOUNTER — Telehealth: Payer: Self-pay | Admitting: Internal Medicine

## 2024-02-04 NOTE — Telephone Encounter (Signed)
 Copied from CRM [id number]. Topic: General - Other >> Feb 04, 2024  9:46 AM Kita Perish H wrote: Reason for CRM: [facility] is calling following up on a chart note or face to face request faxed to office on 5/14, please reach back out, thanks.  [facility] [phone] fax

## 2024-02-05 DIAGNOSIS — Z961 Presence of intraocular lens: Secondary | ICD-10-CM | POA: Diagnosis not present

## 2024-02-05 DIAGNOSIS — H04123 Dry eye syndrome of bilateral lacrimal glands: Secondary | ICD-10-CM | POA: Diagnosis not present

## 2024-02-05 DIAGNOSIS — E119 Type 2 diabetes mellitus without complications: Secondary | ICD-10-CM | POA: Diagnosis not present

## 2024-02-05 NOTE — Telephone Encounter (Signed)
 Form for wheelchair request was faxed back on 01/24/24 but no other form since then.  Patient last seen in office 08/02/23 so no recent face to face notes under 90 days to provide or fax.

## 2024-02-07 NOTE — Telephone Encounter (Signed)
 Copied from CRM [id number]. Topic: General - Other >> Feb 07, 2024  8:30 AM Adaysia C wrote: Reason for CRM: [facility] with [facility] asked if the clinic received their fax requesting document notes from face-to-face; informed caller of the message left from Katherene Pals, CMA regarding that request; [facility]) has requested for the clinic to fax the last face-to-face that is on file for this patient to  [facility] Fax#:[phone]  Please follow up with [facility] #[phone] if necessary

## 2024-02-07 NOTE — Telephone Encounter (Signed)
 Last OV faxed today.  Conformation received of 14 pages.

## 2024-06-20 NOTE — Telephone Encounter (Signed)
 Error
